# Patient Record
Sex: Male | Born: 1960 | Race: Black or African American | Hispanic: No | Marital: Single | Smoking: Never smoker
Health system: Southern US, Community
[De-identification: ages and names within clinical notes are randomized; demographics above are authoritative.]

## PROBLEM LIST (undated history)

## (undated) DIAGNOSIS — E559 Vitamin D deficiency, unspecified: Secondary | ICD-10-CM

## (undated) DIAGNOSIS — N201 Calculus of ureter: Secondary | ICD-10-CM

## (undated) DIAGNOSIS — N319 Neuromuscular dysfunction of bladder, unspecified: Secondary | ICD-10-CM

## (undated) DIAGNOSIS — M62838 Other muscle spasm: Secondary | ICD-10-CM

## (undated) DIAGNOSIS — L89309 Pressure ulcer of unspecified buttock, unspecified stage: Secondary | ICD-10-CM

## (undated) DIAGNOSIS — G709 Myoneural disorder, unspecified: Secondary | ICD-10-CM

## (undated) DIAGNOSIS — Z66 Do not resuscitate: Secondary | ICD-10-CM

## (undated) DIAGNOSIS — E871 Hypo-osmolality and hyponatremia: Secondary | ICD-10-CM

## (undated) DIAGNOSIS — R209 Unspecified disturbances of skin sensation: Secondary | ICD-10-CM

## (undated) DIAGNOSIS — N200 Calculus of kidney: Secondary | ICD-10-CM

## (undated) DIAGNOSIS — G35 Multiple sclerosis: Secondary | ICD-10-CM

## (undated) DIAGNOSIS — M25559 Pain in unspecified hip: Secondary | ICD-10-CM

## (undated) DIAGNOSIS — R339 Retention of urine, unspecified: Secondary | ICD-10-CM

## (undated) HISTORY — DX: Unspecified disturbances of skin sensation: R20.9

## (undated) HISTORY — DX: Vitamin D deficiency, unspecified: E55.9

## (undated) HISTORY — DX: Pressure ulcer of unspecified buttock, unspecified stage: L89.309

## (undated) HISTORY — DX: Other muscle spasm: M62.838

## (undated) HISTORY — DX: Pain in unspecified hip: M25.559

## (undated) HISTORY — PX: KIDNEY STONE SURGERY: SHX686

---

## 1997-08-24 ENCOUNTER — Encounter (HOSPITAL_COMMUNITY): Admission: RE | Admit: 1997-08-24 | Discharge: 1997-11-22 | Payer: Self-pay | Admitting: Neurology

## 1999-02-25 ENCOUNTER — Ambulatory Visit (HOSPITAL_COMMUNITY): Admission: RE | Admit: 1999-02-25 | Discharge: 1999-02-25 | Payer: Self-pay | Admitting: *Deleted

## 1999-02-25 ENCOUNTER — Encounter: Payer: Self-pay | Admitting: *Deleted

## 1999-04-08 ENCOUNTER — Ambulatory Visit (HOSPITAL_COMMUNITY): Admission: RE | Admit: 1999-04-08 | Discharge: 1999-04-08 | Payer: Self-pay | Admitting: *Deleted

## 1999-04-29 ENCOUNTER — Encounter: Payer: Self-pay | Admitting: *Deleted

## 1999-04-29 ENCOUNTER — Ambulatory Visit (HOSPITAL_COMMUNITY): Admission: RE | Admit: 1999-04-29 | Discharge: 1999-04-29 | Payer: Self-pay | Admitting: *Deleted

## 1999-05-15 ENCOUNTER — Ambulatory Visit (HOSPITAL_COMMUNITY): Admission: RE | Admit: 1999-05-15 | Discharge: 1999-05-15 | Payer: Self-pay | Admitting: *Deleted

## 1999-05-15 ENCOUNTER — Encounter: Payer: Self-pay | Admitting: *Deleted

## 2001-02-26 ENCOUNTER — Encounter: Admission: RE | Admit: 2001-02-26 | Discharge: 2001-04-05 | Payer: Self-pay | Admitting: Neurology

## 2002-12-07 ENCOUNTER — Ambulatory Visit: Admission: RE | Admit: 2002-12-07 | Discharge: 2002-12-07 | Payer: Self-pay | Admitting: Neurology

## 2002-12-15 ENCOUNTER — Encounter (HOSPITAL_COMMUNITY): Admission: RE | Admit: 2002-12-15 | Discharge: 2003-03-15 | Payer: Self-pay | Admitting: Neurology

## 2003-02-10 ENCOUNTER — Ambulatory Visit (HOSPITAL_COMMUNITY): Admission: RE | Admit: 2003-02-10 | Discharge: 2003-02-10 | Payer: Self-pay | Admitting: Neurology

## 2003-02-10 ENCOUNTER — Encounter: Payer: Self-pay | Admitting: Neurology

## 2003-03-16 ENCOUNTER — Encounter (HOSPITAL_COMMUNITY): Admission: RE | Admit: 2003-03-16 | Discharge: 2003-06-14 | Payer: Self-pay | Admitting: Neurology

## 2003-06-22 ENCOUNTER — Encounter (HOSPITAL_COMMUNITY): Admission: RE | Admit: 2003-06-22 | Discharge: 2003-09-20 | Payer: Self-pay | Admitting: Neurology

## 2003-12-14 ENCOUNTER — Ambulatory Visit (HOSPITAL_COMMUNITY): Admission: RE | Admit: 2003-12-14 | Discharge: 2003-12-14 | Payer: Self-pay | Admitting: Neurology

## 2004-02-15 ENCOUNTER — Encounter (HOSPITAL_COMMUNITY): Admission: RE | Admit: 2004-02-15 | Discharge: 2004-05-15 | Payer: Self-pay | Admitting: Neurology

## 2004-06-19 ENCOUNTER — Ambulatory Visit: Admission: RE | Admit: 2004-06-19 | Discharge: 2004-06-19 | Payer: Self-pay | Admitting: Neurology

## 2004-06-19 ENCOUNTER — Ambulatory Visit: Payer: Self-pay | Admitting: Cardiology

## 2004-11-15 ENCOUNTER — Encounter: Admission: RE | Admit: 2004-11-15 | Discharge: 2004-11-15 | Payer: Self-pay | Admitting: Neurology

## 2010-10-07 ENCOUNTER — Other Ambulatory Visit: Payer: Self-pay | Admitting: Neurology

## 2010-10-07 DIAGNOSIS — R202 Paresthesia of skin: Secondary | ICD-10-CM

## 2010-10-07 DIAGNOSIS — M8580 Other specified disorders of bone density and structure, unspecified site: Secondary | ICD-10-CM

## 2010-10-07 DIAGNOSIS — G35 Multiple sclerosis: Secondary | ICD-10-CM

## 2010-10-21 ENCOUNTER — Ambulatory Visit
Admission: RE | Admit: 2010-10-21 | Discharge: 2010-10-21 | Disposition: A | Discharge status: Home / Self Care | Payer: BC Managed Care – PPO | Source: Ambulatory Visit | Attending: Neurology | Admitting: Neurology

## 2010-10-21 DIAGNOSIS — M8580 Other specified disorders of bone density and structure, unspecified site: Secondary | ICD-10-CM

## 2010-10-21 DIAGNOSIS — G35 Multiple sclerosis: Secondary | ICD-10-CM

## 2010-10-21 DIAGNOSIS — R202 Paresthesia of skin: Secondary | ICD-10-CM

## 2011-07-10 ENCOUNTER — Emergency Department (HOSPITAL_COMMUNITY): Payer: BC Managed Care – PPO

## 2011-07-10 ENCOUNTER — Encounter (HOSPITAL_COMMUNITY): Payer: Self-pay | Admitting: Emergency Medicine

## 2011-07-10 ENCOUNTER — Other Ambulatory Visit (HOSPITAL_COMMUNITY): Payer: Self-pay | Admitting: Pharmacy Technician

## 2011-07-10 ENCOUNTER — Emergency Department (HOSPITAL_COMMUNITY)
Admission: EM | Admit: 2011-07-10 | Discharge: 2011-07-11 | Disposition: A | Discharge status: Home Health | Payer: BC Managed Care – PPO | Attending: Emergency Medicine | Admitting: Emergency Medicine

## 2011-07-10 DIAGNOSIS — G35 Multiple sclerosis: Secondary | ICD-10-CM | POA: Insufficient documentation

## 2011-07-10 DIAGNOSIS — R5381 Other malaise: Secondary | ICD-10-CM | POA: Insufficient documentation

## 2011-07-10 DIAGNOSIS — R000 Tachycardia, unspecified: Secondary | ICD-10-CM | POA: Insufficient documentation

## 2011-07-10 DIAGNOSIS — M6282 Rhabdomyolysis: Secondary | ICD-10-CM | POA: Insufficient documentation

## 2011-07-10 DIAGNOSIS — M25559 Pain in unspecified hip: Secondary | ICD-10-CM | POA: Insufficient documentation

## 2011-07-10 DIAGNOSIS — W19XXXA Unspecified fall, initial encounter: Secondary | ICD-10-CM

## 2011-07-10 DIAGNOSIS — R509 Fever, unspecified: Secondary | ICD-10-CM | POA: Insufficient documentation

## 2011-07-10 HISTORY — DX: Multiple sclerosis: G35

## 2011-07-10 LAB — DIFFERENTIAL
Basophils Absolute: 0 10*3/uL (ref 0.0–0.1)
Basophils Relative: 0 % (ref 0–1)
Eosinophils Absolute: 0.2 10*3/uL (ref 0.0–0.7)
Monocytes Absolute: 1 10*3/uL (ref 0.1–1.0)
Monocytes Relative: 9 % (ref 3–12)
Neutro Abs: 8.7 10*3/uL — ABNORMAL HIGH (ref 1.7–7.7)
Neutrophils Relative %: 78 % — ABNORMAL HIGH (ref 43–77)

## 2011-07-10 LAB — CBC
HCT: 41.6 % (ref 39.0–52.0)
Hemoglobin: 14.1 g/dL (ref 13.0–17.0)
MCH: 27.5 pg (ref 26.0–34.0)
MCHC: 33.9 g/dL (ref 30.0–36.0)
RDW: 14.2 % (ref 11.5–15.5)

## 2011-07-10 LAB — POCT I-STAT, CHEM 8
BUN: 12 mg/dL (ref 6–23)
Calcium, Ion: 1.05 mmol/L — ABNORMAL LOW (ref 1.12–1.32)
Chloride: 100 mEq/L (ref 96–112)
Glucose, Bld: 101 mg/dL — ABNORMAL HIGH (ref 70–99)
TCO2: 27 mmol/L (ref 0–100)

## 2011-07-10 MED ORDER — OXYCODONE HCL 5 MG PO TABS
5.0000 mg | ORAL_TABLET | Freq: Once | ORAL | Status: AC
  Administered 2011-07-10: 5 mg via ORAL
  Filled 2011-07-10: qty 1

## 2011-07-10 NOTE — ED Notes (Signed)
MD at bedside. Almond Lint, PA at bedside.

## 2011-07-10 NOTE — ED Provider Notes (Signed)
History     CSN: [id number]  Arrival date & time 07/10/11  2033   First MD Initiated Contact with Patient 07/10/11 2039      Chief Complaint  Patient presents with  . Hip Pain  . Fall    (Consider location/radiation/quality/duration/timing/severity/associated sxs/prior treatment) HPI Comments: With multiple sclerosis, that he slid out of bed yesterday.  He sat on the floor and landed on his buttock.  He proceeded to bring his legs behind him sitting froglike for several hours as he was unable to get up by himself and he was allowed in the home.  He stayed in that position for approximately 10 hours when someone came and assisted him back up into the, bed.  He's been trying to stretch and move his legs, but he is having pain in the bilateral inner groin area and is now unable to bear weight on his legs.  Today, was the Betaseron injection day, and he, normally runs low grade fevers with this.  His fever on presentation to the emergency room is 100 by mouth  Patient is a 51 y.o. male presenting with hip pain and fall.  Hip Pain This is a new problem. The current episode started yesterday. The problem occurs constantly. The problem has been unchanged. Associated symptoms include a fever and weakness. Pertinent negatives include no joint swelling or nausea. The symptoms are aggravated by nothing. The treatment provided no relief.  Fall Associated symptoms include a fever. Pertinent negatives include no nausea.    Past Medical History  Diagnosis Date  . Multiple sclerosis     No past surgical history on file.  No family history on file.  History  Substance Use Topics  . Smoking status: Not on file  . Smokeless tobacco: Not on file  . Alcohol Use:       Review of Systems  Constitutional: Positive for fever.  Gastrointestinal: Negative for nausea, diarrhea and constipation.  Musculoskeletal: Negative for joint swelling.  Neurological: Positive for weakness.    Allergies    Review of patient's allergies indicates no known allergies.  Home Medications  No current outpatient prescriptions on file.  BP 145/82  Pulse 129  Temp(Src) 100.1 F (37.8 C) (Oral)  Resp 22  SpO2 94%  Physical Exam  Constitutional: He is oriented to person, place, and time. He appears well-developed and well-nourished.  HENT:  Head: Normocephalic.  Eyes: Pupils are equal, round, and reactive to light.  Neck: Normal range of motion.  Cardiovascular: Tachycardia present.   Pulmonary/Chest: Effort normal.  Abdominal: Soft.  Musculoskeletal: He exhibits no edema and no tenderness.  Neurological: He is alert and oriented to person, place, and time.  Skin: Skin is warm and dry.    ED Course  Procedures (including critical care time)  Labs Reviewed - No data to display No results found.   No diagnosis found.    MDM  Fall, mild rhabdo patient was hydrated with IV fluids is feeling better.  His pelvis was x-rayed for potential fracture, which was negative        Arman Filter, NP 07/11/11 0559

## 2011-07-10 NOTE — ED Notes (Signed)
WGN:[id number]<HY> Expected date:07/10/11<BR> Expected time: 8:35 PM<BR> Means of arrival:Ambulance<BR> Comments:<BR> EMS 231 GC - fall/hip pain

## 2011-07-10 NOTE — ED Notes (Signed)
Pt fell 2 days ago and then fell again tonight. Pt has hx of MS. Pt states his equilibrium is off. Pt c/o bilateral hip pain. Pt states his hips have been stiff and having difficulty bearing weight. EMS reports no deformities or bruising to hips. Pt normally ambulates with cane.

## 2011-07-11 LAB — CK TOTAL AND CKMB (NOT AT ARMC): Relative Index: 0.7 (ref 0.0–2.5)

## 2011-07-11 MED ORDER — SODIUM CHLORIDE 0.9 % IV SOLN
Freq: Once | INTRAVENOUS | Status: AC
  Administered 2011-07-11: 06:00:00 via INTRAVENOUS

## 2011-07-11 MED ORDER — SODIUM CHLORIDE 0.9 % IV BOLUS (SEPSIS)
1000.0000 mL | Freq: Once | INTRAVENOUS | Status: AC
  Administered 2011-07-11: 1000 mL via INTRAVENOUS

## 2011-07-11 NOTE — ED Notes (Signed)
[facility] here to get pt  

## 2011-07-11 NOTE — ED Notes (Addendum)
[facility] called to come pick up pt.  

## 2011-07-11 NOTE — Discharge Instructions (Signed)
Rhabdomyolysis Rhabdomyolysis is the breakdown of muscle fibers due to injury. The injury may come from physical damage to the muscle like an injury but other causes are:  High fever (hyperthermia).   Seizures (convulsions).   Low phosphate levels.   Diseases of metabolism.   Heatstroke.   Drug toxicity.   Over exertion.   Alcoholism.   Muscle is cut off from oxygen (anoxia).   The squeezing of nerves and blood vessels (compartment syndrome).  Some drugs which may cause the breakdown of muscle are:  Antibiotics.   Statins.   Alcohol.   Animal toxins.  Myoglobin is a substance which helps muscle use oxygen. When the muscle is damaged, the myoglobin is released into the bloodstream. It is filtered out of the bloodstream by the kidneys. Myoglobin may block up the kidneys. This may cause damage, such as kidney failure. It also breaks down into other damaging toxic parts, which also cause kidney failure.  SYMPTOMS   Dark, red, or tea colored urine.   Weakness of affected muscles.   Weight gain from water retention.   Joint aches and pains.   Irregular heart from high potassium in the blood.   Muscle tenderness or aching.   Generalized weakness.   Seizures.   Feeling tired (fatigue).  DIAGNOSIS  Your caregiver may find muscle tenderness on exam and suspect the problem. Urine tests and blood work can confirm the problem. TREATMENT   Early and aggressive treatment with large amounts of fluids may help prevent kidney failure.   Water producing medicine (diuretic) may be used to help flush the kidneys.   High potassium and calcium problems (electrolyte) in your blood may need treatment.  HOME CARE INSTRUCTIONS  This problem is usually cared for in a hospital. If you are allowed to go home and require dialysis, make sure you keep all appointments for lab work and dialysis. Not doing so could result in death. Document Released: 04-21-04 Document Revised:  01/15/2011 Document Reviewed: 10/30/2008 [facility] Patient Information 2012 [place], [place]. I have made a referral to home health for evaluation to help with your social situation and help you with ambulation.  They should be contacting you within the next one to 2 days

## 2011-07-12 NOTE — ED Provider Notes (Signed)
Medical screening examination/treatment/procedure(s) were performed by non-physician practitioner and as supervising physician I was immediately available for consultation/collaboration. Sabre Leonetti, MD, FACEP   Ariele Vidrio L Yeila Morro, MD 07/12/11 0053 

## 2011-07-25 ENCOUNTER — Other Ambulatory Visit: Payer: Self-pay

## 2011-07-25 ENCOUNTER — Inpatient Hospital Stay (HOSPITAL_COMMUNITY)
Admission: EM | Admit: 2011-07-25 | Discharge: 2011-08-04 | DRG: 560 | Disposition: A | Discharge status: Skilled Nursing Facility | Payer: BC Managed Care – PPO | Attending: Internal Medicine | Admitting: Internal Medicine

## 2011-07-25 DIAGNOSIS — K5641 Fecal impaction: Secondary | ICD-10-CM | POA: Diagnosis present

## 2011-07-25 DIAGNOSIS — L98499 Non-pressure chronic ulcer of skin of other sites with unspecified severity: Secondary | ICD-10-CM

## 2011-07-25 DIAGNOSIS — R000 Tachycardia, unspecified: Secondary | ICD-10-CM

## 2011-07-25 DIAGNOSIS — G35 Multiple sclerosis: Secondary | ICD-10-CM | POA: Diagnosis present

## 2011-07-25 DIAGNOSIS — G35D Multiple sclerosis, unspecified: Secondary | ICD-10-CM | POA: Diagnosis present

## 2011-07-25 DIAGNOSIS — R7309 Other abnormal glucose: Secondary | ICD-10-CM | POA: Diagnosis present

## 2011-07-25 DIAGNOSIS — D473 Essential (hemorrhagic) thrombocythemia: Secondary | ICD-10-CM | POA: Diagnosis present

## 2011-07-25 DIAGNOSIS — L89209 Pressure ulcer of unspecified hip, unspecified stage: Secondary | ICD-10-CM | POA: Diagnosis present

## 2011-07-25 DIAGNOSIS — M6282 Rhabdomyolysis: Principal | ICD-10-CM | POA: Diagnosis present

## 2011-07-25 DIAGNOSIS — M25559 Pain in unspecified hip: Secondary | ICD-10-CM | POA: Diagnosis present

## 2011-07-25 DIAGNOSIS — L8995 Pressure ulcer of unspecified site, unstageable: Secondary | ICD-10-CM | POA: Diagnosis present

## 2011-07-25 DIAGNOSIS — W19XXXA Unspecified fall, initial encounter: Secondary | ICD-10-CM | POA: Diagnosis present

## 2011-07-25 DIAGNOSIS — D75839 Thrombocytosis, unspecified: Secondary | ICD-10-CM | POA: Diagnosis present

## 2011-07-25 DIAGNOSIS — T07XXXA Unspecified multiple injuries, initial encounter: Secondary | ICD-10-CM | POA: Diagnosis present

## 2011-07-25 DIAGNOSIS — L0390 Cellulitis, unspecified: Secondary | ICD-10-CM

## 2011-07-25 DIAGNOSIS — D649 Anemia, unspecified: Secondary | ICD-10-CM | POA: Diagnosis present

## 2011-07-25 DIAGNOSIS — L89899 Pressure ulcer of other site, unspecified stage: Secondary | ICD-10-CM | POA: Diagnosis present

## 2011-07-25 DIAGNOSIS — R509 Fever, unspecified: Secondary | ICD-10-CM | POA: Diagnosis present

## 2011-07-25 DIAGNOSIS — E860 Dehydration: Secondary | ICD-10-CM | POA: Diagnosis present

## 2011-07-25 DIAGNOSIS — D72829 Elevated white blood cell count, unspecified: Secondary | ICD-10-CM | POA: Diagnosis present

## 2011-07-25 DIAGNOSIS — D6489 Other specified anemias: Secondary | ICD-10-CM | POA: Diagnosis present

## 2011-07-25 LAB — DIFFERENTIAL
Basophils Absolute: 0.1 10*3/uL (ref 0.0–0.1)
Basophils Relative: 0 % (ref 0–1)
Eosinophils Absolute: 0.1 10*3/uL (ref 0.0–0.7)
Eosinophils Relative: 1 % (ref 0–5)
Monocytes Absolute: 1.1 10*3/uL — ABNORMAL HIGH (ref 0.1–1.0)

## 2011-07-25 LAB — CBC
HCT: 42.5 % (ref 39.0–52.0)
MCH: 26.2 pg (ref 26.0–34.0)
MCHC: 31.8 g/dL (ref 30.0–36.0)
MCV: 82.5 fL (ref 78.0–100.0)
Platelets: 474 10*3/uL — ABNORMAL HIGH (ref 150–400)
RDW: 14.1 % (ref 11.5–15.5)

## 2011-07-25 LAB — COMPREHENSIVE METABOLIC PANEL
ALT: 28 U/L (ref 0–53)
AST: 40 U/L — ABNORMAL HIGH (ref 0–37)
CO2: 30 mEq/L (ref 19–32)
Calcium: 9.6 mg/dL (ref 8.4–10.5)
Creatinine, Ser: 0.63 mg/dL (ref 0.50–1.35)
GFR calc non Af Amer: >90 mL/min (ref >90)
Sodium: 134 mEq/L — ABNORMAL LOW (ref 135–145)
Total Protein: 7.7 g/dL (ref 6.0–8.3)

## 2011-07-25 MED ORDER — SODIUM BICARBONATE 8.4 % IV SOLN
INTRAVENOUS | Status: DC
  Filled 2011-07-25 (×3): qty 1000

## 2011-07-25 MED ORDER — SODIUM CHLORIDE 0.9 % IV BOLUS (SEPSIS)
1000.0000 mL | Freq: Once | INTRAVENOUS | Status: AC
  Administered 2011-07-25: 1000 mL via INTRAVENOUS

## 2011-07-25 MED ORDER — METHYLPREDNISOLONE SODIUM SUCC 125 MG IJ SOLR
125.0000 mg | Freq: Once | INTRAMUSCULAR | Status: AC
  Administered 2011-07-26: 125 mg via INTRAVENOUS
  Filled 2011-07-25: qty 2

## 2011-07-25 NOTE — ED Provider Notes (Addendum)
History     CSN: [id number]  Arrival date & time 07/25/11  1924   First MD Initiated Contact with Patient 07/25/11 2034      Chief Complaint  Patient presents with  . Weakness  . Multiple Sclerosis    (Consider location/radiation/quality/duration/timing/severity/associated sxs/prior treatment) HPI  51yoM h/o MS pw weakness. Ordering to the patient he has been sitting on the floor at home since last Sunday approximately 5 days. The patient states that he was unable to transfer him to his bed and slid onto the floor. He states that he's been lying on his right side since that time. The patient states that he did have access to water and a urinal. He states that he also had access to a phone but could not find his situation to be unusual and did not call anyone for help. He lives with his mother who has dementia. The patient complains of his baseline weakness. He states that he has baseline pain in both hips as well. It is not worse the usual. He denies trauma, fall. He denies fevers, chills, chest pain, shortness of breath. He states that he was referred here for evaluation and possible placement into a skilled nursing facility. She denies headache, dizziness, chest pain, shortness of breath. He denies abdominal pain, nausea, vomiting. There is no back pain. As noted above baseline pain in his hips. There is no new numbness, tingling or weakness of his lower extremity. He is contracted. He is due for his steroids today but states that he was referred here instead.  ED Notes, ED Provider Notes from 07/25/11 0000 to 07/25/11 20:18:08       Thomasenia Bottoms, RN 07/25/2011 20:15      EMS sts pt in floor since Sunday. Pt sts has MS. Fell on floor Sunday. Could not get up. Fell asleep on floor. Mother had been bring him food/fluids, but was not able get him up. She did not call for help.         Thomasenia Bottoms, RN 07/25/2011 19:41      WUJ:[id number]  Expected date:07/25/11  Expected time: 7:03 PM  Means of  arrival:Ambulance  Comments:  M80. Male, fall found in floor, possible been there for days, hx of MS, needs eval and placement into facility. 15 mins    Past Medical History  Diagnosis Date  . Multiple sclerosis    No past surgical history on file.  No family history on file.  History  Substance Use Topics  . Smoking status: Not on file  . Smokeless tobacco: Not on file  . Alcohol Use:     Review of Systems  All other systems reviewed and are negative.  except as noted HPI   Allergies  Review of patient's allergies indicates no known allergies.  Home Medications   Current Outpatient Rx  Name Route Sig Dispense Refill  . AMANTADINE HCL 100 MG PO CAPS Oral Take 200 mg by mouth daily.    Marland Kitchen CALCIUM CARBONATE 1250 MG PO TABS Oral Take 1 tablet by mouth daily.    Marland Kitchen DANTROLENE SODIUM 25 MG PO CAPS Oral Take 25 mg by mouth 3 (three) times daily.    Marland Kitchen GABAPENTIN 600 MG PO TABS Oral Take 600 mg by mouth 4 (four) times daily.    . INTERFERON BETA-1B 0.3 MG Pepin SOLR Subcutaneous Inject 0.25 mg into the skin every other day. Next dose 07-11-2011    . MODAFINIL 200 MG PO TABS Oral Take 200 mg by  mouth daily.      BP 117/77  Pulse 128  Temp(Src) 102.2 F (39 C) (Rectal)  Resp 12  SpO2 96%  Physical Exam  Nursing note and vitals reviewed. Constitutional: He is oriented to person, place, and time. He appears well-developed and well-nourished. No distress.  HENT:  Head: Atraumatic.  Mouth/Throat: Oropharynx is clear and moist.  Eyes: Conjunctivae are normal. Pupils are equal, round, and reactive to light.  Neck: Neck supple.  Cardiovascular: Regular rhythm, normal heart sounds and intact distal pulses.  Exam reveals no gallop and no friction rub.   No murmur heard.      tachycardic  Pulmonary/Chest: Effort normal. No respiratory distress. He has no wheezes. He has no rales.  Abdominal: Soft. Bowel sounds are normal. There is no tenderness. There is no rebound and no guarding.    Musculoskeletal: Normal range of motion. He exhibits no edema and no tenderness.  Neurological: He is alert and oriented to person, place, and time.       Lower extremities contracted  Skin: Skin is warm and dry.          R lateral mid/upper/lower back with minimally bleeding ulcer with surrounding erythema, and necrotic tissue no ttp  Per nursing staff, Grade II ulcer R buttocks  Lt hip with healing ulcer/scab  Psychiatric: He has a normal mood and affect.    Date: 07/25/2011  Rate: 129  Rhythm: sinus tachycardia  QRS Axis: normal  Intervals: normal  ST/T Wave abnormalities: normal  Conduction Disutrbances:none  Narrative Interpretation:   Old EKG Reviewed: none available   ED Course  Procedures (including critical care time)  Labs Reviewed  CBC - Abnormal; Notable for the following:    WBC 12.5 (*)    Platelets 474 (*)    All other components within normal limits  DIFFERENTIAL - Abnormal; Notable for the following:    Neutrophils Relative 79 (*)    Neutro Abs 9.9 (*)    Lymphocytes Relative 11 (*)    Monocytes Absolute 1.1 (*)    All other components within normal limits  COMPREHENSIVE METABOLIC PANEL - Abnormal; Notable for the following:    Sodium 134 (*)    Chloride 93 (*)    Glucose, Bld 101 (*)    Albumin 2.8 (*)    AST 40 (*)    All other components within normal limits  CK - Abnormal; Notable for the following:    Total CK 1526 (*)    All other components within normal limits   No results found.   1. Dehydration   2. Skin ulcer   3. Multiple sclerosis   4. Tachycardia   5. Cellulitis     MDM  Patient presents after being on the floor for several days. He states that his MS symptoms are at baseline. He has multiple ulcers on his body with surrounding cellulitis.  He is not having pain at the site of the ulcers. He denies fevers, chills. IVF/hydration. Continues to deny pain. Admitted to [facility] for further w/u and evaluation as well as  possible SNF placement- requesting D5 1/2NS with sodium bicarbonate. Ordered. Will order patient's steroid as he missed dose today. Will cover for cellulitis with clindamycin.  Of note, last visit patient tachycardic to 129 although states that his HR is typically < 100. Discussed admission with [facility] hospitalist Dr. Conley Rolls.   Per nursing staff rectal temp 101. Tylenol ordered.        Forbes Cellar, MD 07/25/11  1610  Forbes Cellar, MD 07/26/11 9604  Forbes Cellar, MD 07/26/11 5409

## 2011-07-25 NOTE — ED Notes (Signed)
ZOX:[id number]<AV> Expected date:07/25/11<BR> Expected time: 7:03 PM<BR> Means of arrival:Ambulance<BR> Comments:<BR> M80. Male, fall found in floor, possible been there for days, hx of MS, needs eval and placement into facility. 15 mins

## 2011-07-25 NOTE — ED Notes (Signed)
EMS sts pt in floor since Sunday. Pt sts has MS. Fell on floor Sunday. Could not get up. Fell asleep on floor. Mother had been bring him food/fluids, but was not able get him up. She did not call for help.

## 2011-07-26 ENCOUNTER — Encounter (HOSPITAL_COMMUNITY): Payer: Self-pay | Admitting: Internal Medicine

## 2011-07-26 DIAGNOSIS — M6282 Rhabdomyolysis: Principal | ICD-10-CM | POA: Diagnosis present

## 2011-07-26 DIAGNOSIS — E860 Dehydration: Secondary | ICD-10-CM | POA: Diagnosis present

## 2011-07-26 DIAGNOSIS — G35 Multiple sclerosis: Secondary | ICD-10-CM | POA: Diagnosis present

## 2011-07-26 LAB — BASIC METABOLIC PANEL
CO2: 25 mEq/L (ref 19–32)
Glucose, Bld: 188 mg/dL — ABNORMAL HIGH (ref 70–99)
Potassium: 4 mEq/L (ref 3.5–5.1)
Sodium: 135 mEq/L (ref 135–145)

## 2011-07-26 LAB — CBC
Hemoglobin: 11.8 g/dL — ABNORMAL LOW (ref 13.0–17.0)
MCH: 26.8 pg (ref 26.0–34.0)
MCV: 81.4 fL (ref 78.0–100.0)
RBC: 4.41 MIL/uL (ref 4.22–5.81)

## 2011-07-26 MED ORDER — SODIUM CHLORIDE 0.9 % IV SOLN
INTRAVENOUS | Status: DC
  Administered 2011-07-26: 150 mL/h via INTRAVENOUS
  Administered 2011-07-27 (×3): via INTRAVENOUS

## 2011-07-26 MED ORDER — INTERFERON BETA-1B 0.3 MG ~~LOC~~ SOLR
0.2500 mg | SUBCUTANEOUS | Status: DC
  Administered 2011-07-26 – 2011-08-03 (×5): 0.25 mg via SUBCUTANEOUS

## 2011-07-26 MED ORDER — ENOXAPARIN SODIUM 40 MG/0.4ML ~~LOC~~ SOLN
40.0000 mg | SUBCUTANEOUS | Status: DC
  Administered 2011-07-26 – 2011-08-04 (×10): 40 mg via SUBCUTANEOUS
  Filled 2011-07-26 (×11): qty 0.4

## 2011-07-26 MED ORDER — CALCIUM CARBONATE 1250 (500 CA) MG PO TABS
1.0000 | ORAL_TABLET | Freq: Every day | ORAL | Status: DC
  Administered 2011-07-26 – 2011-08-04 (×10): 500 mg via ORAL
  Filled 2011-07-26 (×11): qty 1

## 2011-07-26 MED ORDER — GABAPENTIN 300 MG PO CAPS
600.0000 mg | ORAL_CAPSULE | Freq: Four times a day (QID) | ORAL | Status: DC
  Administered 2011-07-26 – 2011-08-04 (×39): 600 mg via ORAL
  Filled 2011-07-26 (×44): qty 2

## 2011-07-26 MED ORDER — DEXTROSE-NACL 5-0.9 % IV SOLN
INTRAVENOUS | Status: DC
  Administered 2011-07-26: 1000 mL via INTRAVENOUS
  Administered 2011-07-26: 14:00:00 via INTRAVENOUS

## 2011-07-26 MED ORDER — DANTROLENE SODIUM 25 MG PO CAPS
25.0000 mg | ORAL_CAPSULE | Freq: Three times a day (TID) | ORAL | Status: DC
  Administered 2011-07-26 – 2011-08-04 (×29): 25 mg via ORAL
  Filled 2011-07-26 (×34): qty 1

## 2011-07-26 MED ORDER — LEVOFLOXACIN IN D5W 750 MG/150ML IV SOLN
750.0000 mg | INTRAVENOUS | Status: DC
  Administered 2011-07-26 – 2011-07-27 (×2): 750 mg via INTRAVENOUS
  Filled 2011-07-26 (×3): qty 150

## 2011-07-26 MED ORDER — INFLUENZA VIRUS VACC SPLIT PF IM SUSP
0.5000 mL | Freq: Once | INTRAMUSCULAR | Status: AC
  Administered 2011-07-26: 0.5 mL via INTRAMUSCULAR
  Filled 2011-07-26: qty 0.5

## 2011-07-26 MED ORDER — AMANTADINE HCL 100 MG PO CAPS
200.0000 mg | ORAL_CAPSULE | Freq: Every day | ORAL | Status: DC
  Administered 2011-07-26: 200 mg via ORAL
  Administered 2011-07-27: 100 mg via ORAL
  Filled 2011-07-26 (×3): qty 2

## 2011-07-26 MED ORDER — ACETAMINOPHEN 325 MG PO TABS
650.0000 mg | ORAL_TABLET | Freq: Once | ORAL | Status: AC
  Administered 2011-07-26: 650 mg via ORAL
  Filled 2011-07-26: qty 2

## 2011-07-26 MED ORDER — SODIUM BICARBONATE 8.4 % IV SOLN
INTRAVENOUS | Status: DC
  Filled 2011-07-26 (×2): qty 1000

## 2011-07-26 MED ORDER — CLINDAMYCIN PHOSPHATE 900 MG/50ML IV SOLN
900.0000 mg | Freq: Once | INTRAVENOUS | Status: AC
  Administered 2011-07-26: 900 mg via INTRAVENOUS
  Filled 2011-07-26: qty 50

## 2011-07-26 MED ORDER — MODAFINIL 200 MG PO TABS
200.0000 mg | ORAL_TABLET | Freq: Every day | ORAL | Status: DC
  Administered 2011-07-26 – 2011-08-04 (×10): 200 mg via ORAL
  Filled 2011-07-26 (×11): qty 1

## 2011-07-26 NOTE — ED Notes (Signed)
Has spent 30 minutes with patient.  Performed peri care and also full linen change.

## 2011-07-26 NOTE — Progress Notes (Signed)
PROGRESS NOTE  [name] Mccoy:[id number] DOB: 14-Oct-1960 DOA: 07/25/2011 PCP: No primary provider on file. Neurologist: Avie Echevaria, M.D.  Brief narrative: 51 year old man with history of multiple sclerosis. He presented to the emergency department with a history of a fall approximately 5 days prior to admission. He was unable to get up and therefore had been laying on his right side since that time. In the emergency department he was noted to be febrile and have cellulitis. Other conditions included dehydration and rhabdomyolysis.  Summary of records:  07/10/2011 emergency department visit: Fall. Mild rhabdomyolysis. Patient was given IV fluids and discharged.  Past medical history: Multiple sclerosis, chronic bilateral hip pain  Consultants:  None  Procedures:  None  Antibiotics:  March 9-9: Clindamycin  March 9: Levaquin  Interim History: Chart reviewed in detail. Fever persists.  Subjective: Patient feels okay.  Objective: Filed Vitals:   07/26/11 0100 07/26/11 0200 07/26/11 0243 07/26/11 0342  BP: 115/89 120/77  103/67  Pulse: 124 130  120  Temp:   101.5 F (38.6 C) 99.7 F (37.6 C)  TempSrc:   Oral Oral  Resp: 19   20  Height:    6\' 3"  (1.905 m)  Weight:    64.3 kg (141 lb 12.1 oz)  SpO2: 97% 96%  94%   No intake or output data in the 24 hours ending 07/26/11 0950  Exam:   General:  Appears calm and comfortable.  Cardiovascular: Regular rate and rhythm. No murmur, rub, gallop. No lower extremity edema.  Respiratory: Clear to auscultation bilaterally. No wheezes, rales, rhonchi. Normal respiratory effort.  Skin: Ulcers bandaged.  Psychiatric: Grossly normal mood and affect. Speech fluent and clear. Good historian.  Data Reviewed: Basic Metabolic Panel:  Lab 07/26/11 8119 07/25/11 2040  NA 135 134*  K 4.0 3.8  CL 103 93*  CO2 25 30  GLUCOSE 188* 101*  BUN 17 19  CREATININE 0.57 0.63  CALCIUM 8.2* 9.6  MG -- --  PHOS -- --   Liver  Function Tests:  Lab 07/25/11 2040  AST 40*  ALT 28  ALKPHOS 78  BILITOT 0.7  PROT 7.7  ALBUMIN 2.8*   CBC:  Lab 07/26/11 0538 07/25/11 2040  WBC 12.1* 12.5*  NEUTROABS -- 9.9*  HGB 11.8* 13.5  HCT 35.9* 42.5  MCV 81.4 82.5  PLT 406* 474*   Cardiac Enzymes:  Lab 07/25/11 2040  CKTOTAL 1526*  CKMB --  CKMBINDEX --  TROPONINI --    Recent Results (from the past 240 hour(s))  MRSA PCR SCREENING     Status: Normal   Collection Time   07/26/11  5:21 AM      Component Value Range Status Comment   MRSA by PCR NEGATIVE  NEGATIVE  Final      Studies:  Scheduled Meds:   . acetaminophen  650 mg Oral Once  . amantadine  200 mg Oral Daily  . calcium carbonate  1 tablet Oral Daily  . clindamycin (CLEOCIN) IV  900 mg Intravenous Once  . dantrolene  25 mg Oral TID  . enoxaparin  40 mg Subcutaneous Q24H  . gabapentin  600 mg Oral QID  . influenza  inactive virus vaccine  0.5 mL Intramuscular Once  . interferon beta-1b  0.25 mg Subcutaneous QODAY  . methylPREDNISolone (SOLU-MEDROL) injection  125 mg Intravenous Once  . modafinil  200 mg Oral Daily  . sodium chloride  1,000 mL Intravenous Once  . sodium chloride  1,000 mL Intravenous Once  .  sodium chloride  1,000 mL Intravenous Once   Continuous Infusions:   . dextrose 5 % and 0.9% NaCl 1,000 mL (07/26/11 0242)  . DISCONTD: dextrose 5 % and 0.45% NaCl 1,000 mL with sodium bicarbonate 50 mEq infusion    . DISCONTD: dextrose 5 % and 0.9% NaCl 1,000 mL with sodium bicarbonate 50 mEq infusion      EKG March 8: Independently reviewed. Sinus tachycardia. No acute changes.   Assessment/Plan: 1. Fall: secondary to weakness from multiple sclerosis. Physical therapy consultation. 2. Dehydration: Resolving with IV fluids. 3. Rhabdomyolysis: Repeat total CK in the morning. Continue IV fluids. 4. Cellulitis: Continue IV antibiotics.  5. Multiple ulcers on back, present on admission 6. Multiple sclerosis   Code Status: Full  code Family Communication: None present. Disposition Plan: Pending further evaluation and treatment. Skilled nursing facility anticipated.   Brendia Sacks, MD  [facility] Pager [phone] 07/26/2011, 9:50 AM    LOS: 1 day

## 2011-07-26 NOTE — H&P (Signed)
PCP: Melanie Crazier physician.  Chief Complaint: Unable to get up.   HPI: [name] Mccoy is an 51 y.o. male with history of multiple sclerosis, and incredibly, despite his lower leg weakness, has been able to ambulate with a cane until recently when he felt weak, slipped down on the ground and unable to get up for 5 days.  He denied any pain, headache, nausea, vomiting, diarrhea, or any other symptomology. Unfortunately, he lives with his mother who has dementia, and he no longer feel able to stay with her any longer. He has accepted the fact that he will need a SNIF placement. He has no cough, fever, chills, shortness of breath or chest pain. He has been compliant with his medications including the Betaseron along with prednisone every other day. Evaluation in emergency room included a normal white count, renal function, liver function tests, and electrolytes. His CPK is elevated at 1500. No chest x-ray was done as he had no pulmonary symptoms. Hospital was was asked to admit him for treatment of superficial decubitus with cellulitis, mild rhabdomyolysis, dehydration, and SNIF placement  Rewiew of Systems:  The patient denies anorexia, fever, weight loss,, vision loss, decreased hearing, hoarseness, chest pain, syncope, dyspnea on exertion, peripheral edema, balance deficits, hemoptysis, abdominal pain, melena, hematochezia, severe indigestion/heartburn, hematuria, incontinence, genital sores, suspicious skin lesions, transient blindness, difficulty walking, depression, unusual weight change, abnormal bleeding, enlarged lymph nodes, angioedema, and breast masses..   Past Medical History  Diagnosis Date  . Multiple sclerosis     No past surgical history on file.  Medications:  HOME MEDS: Prior to Admission medications   Medication Sig Start Date End Date Taking? Authorizing Provider  amantadine (SYMMETREL) 100 MG capsule Take 200 mg by mouth daily.   Yes Historical Provider, MD  calcium carbonate  (OS-CAL - DOSED IN MG OF ELEMENTAL CALCIUM) 1250 MG tablet Take 1 tablet by mouth daily.   Yes Historical Provider, MD  dantrolene (DANTRIUM) 25 MG capsule Take 25 mg by mouth 3 (three) times daily.   Yes Historical Provider, MD  gabapentin (NEURONTIN) 600 MG tablet Take 600 mg by mouth 4 (four) times daily.   Yes Historical Provider, MD  interferon beta-1b (BETASERON) 0.3 MG injection Inject 0.25 mg into the skin every other day. Next dose 07-11-2011   Yes Historical Provider, MD  modafinil (PROVIGIL) 200 MG tablet Take 200 mg by mouth daily.   Yes Historical Provider, MD     Allergies:  No Known Allergies  Social History:   does not have a smoking history on file. He does not have any smokeless tobacco history on file. His alcohol and drug histories not on file.  Family History: No family history on file.   Physical Exam: Filed Vitals:   07/25/11 2045 07/25/11 2100 07/25/11 2338 07/26/11 0007  BP:  114/77 102/64 117/77  Pulse:  132 128 128  Temp: 100.1 F (37.8 C)  99.1 F (37.3 C) 102.2 F (39 C)  TempSrc: Oral  Oral Rectal  Resp:  17 23 12   SpO2:  96% 100% 96%   Blood pressure 117/77, pulse 128, temperature 102.2 F (39 C), temperature source Rectal, resp. rate 12, SpO2 96.00%.  GEN:  Pleasant chronic appearing male person lying in the stretcher in no acute distress; cooperative with exam PSYCH:  alert and oriented x4; does not appear anxious does not appear depressed; affect is normal HEENT: Mucous membranes pink and anicteric; PERRLA; EOM intact; no cervical lymphadenopathy nor thyromegaly or carotid bruit; no  JVD; Breasts:: Not examined CHEST WALL: No tenderness CHEST: Normal respiration, clear to auscultation bilaterally HEART: Regular rate and rhythm; no murmurs rubs or gallops BACK: No kyphosis or scoliosis; no CVA tenderness ABDOMEN: Obese, soft non-tender; no masses, no organomegaly, normal abdominal bowel sounds; no pannus; no intertriginous candida. Rectal  Exam: Not done EXTREMITIES: He is quite weak his lower extremity. No pain. He has muscle atrophy and several decubitus and pressure sores, some with evidence of infection as well. Genitalia: not examined PULSES: 2+ and symmetric SKIN: Normal hydration no rash or ulceration CNS: Cranial nerves 2-12 grossly intact no focal neurologic deficit   Labs & Imaging Results for orders placed during the hospital encounter of 07/25/11 (from the past 48 hour(s))  CBC     Status: Abnormal   Collection Time   07/25/11  8:40 PM      Component Value Range Comment   WBC 12.5 (*) 4.0 - 10.5 (K/uL)    RBC 5.15  4.22 - 5.81 (MIL/uL)    Hemoglobin 13.5  13.0 - 17.0 (g/dL)    HCT 16.1  09.6 - 04.5 (%)    MCV 82.5  78.0 - 100.0 (fL)    MCH 26.2  26.0 - 34.0 (pg)    MCHC 31.8  30.0 - 36.0 (g/dL)    RDW 40.9  81.1 - 91.4 (%)    Platelets 474 (*) 150 - 400 (K/uL)   DIFFERENTIAL     Status: Abnormal   Collection Time   07/25/11  8:40 PM      Component Value Range Comment   Neutrophils Relative 79 (*) 43 - 77 (%)    Neutro Abs 9.9 (*) 1.7 - 7.7 (K/uL)    Lymphocytes Relative 11 (*) 12 - 46 (%)    Lymphs Abs 1.4  0.7 - 4.0 (K/uL)    Monocytes Relative 9  3 - 12 (%)    Monocytes Absolute 1.1 (*) 0.1 - 1.0 (K/uL)    Eosinophils Relative 1  0 - 5 (%)    Eosinophils Absolute 0.1  0.0 - 0.7 (K/uL)    Basophils Relative 0  0 - 1 (%)    Basophils Absolute 0.1  0.0 - 0.1 (K/uL)   COMPREHENSIVE METABOLIC PANEL     Status: Abnormal   Collection Time   07/25/11  8:40 PM      Component Value Range Comment   Sodium 134 (*) 135 - 145 (mEq/L)    Potassium 3.8  3.5 - 5.1 (mEq/L)    Chloride 93 (*) 96 - 112 (mEq/L)    CO2 30  19 - 32 (mEq/L)    Glucose, Bld 101 (*) 70 - 99 (mg/dL)    BUN 19  6 - 23 (mg/dL)    Creatinine, Ser 7.82  0.50 - 1.35 (mg/dL)    Calcium 9.6  8.4 - 10.5 (mg/dL)    Total Protein 7.7  6.0 - 8.3 (g/dL)    Albumin 2.8 (*) 3.5 - 5.2 (g/dL)    AST 40 (*) 0 - 37 (U/L)    ALT 28  0 - 53 (U/L)     Alkaline Phosphatase 78  39 - 117 (U/L)    Total Bilirubin 0.7  0.3 - 1.2 (mg/dL)    GFR calc non Af Amer >90  >90 (mL/min)    GFR calc Af Amer >90  >90 (mL/min)   CK     Status: Abnormal   Collection Time   07/25/11  8:40 PM  Component Value Range Comment   Total CK 1526 (*) 7 - 232 (U/L)    No results found.    Assessment Present on Admission:  .Rhabdomyolysis .Multiple sclerosis .Dehydration Decubitus Cellulitis SNIF placement    PLAN: He was given IV fluid with bicarbonate, and now will switch over to normal saline. I will resume his Betaseron and steroid. He does have cellulitis and will continue his clindamycin IV. He also appeared to be dehydrated and will continue his intravenous fluids. His rhabdomyolysis is quite mild. I have consulted wound care to help with management and social service for [facility] placement. He is stable, full code, and will be admitted to [facility] team 3.   Other plans as per orders.   Dhana Totton 07/26/2011, 1:12 AM

## 2011-07-26 NOTE — ED Notes (Addendum)
Pt cleaned of urine Diaper on pt. Pt repositioned on left side. Dr. Hyman Hopes in to assess decubitus.

## 2011-07-27 LAB — BASIC METABOLIC PANEL
CO2: 25 mEq/L (ref 19–32)
Chloride: 105 mEq/L (ref 96–112)
Potassium: 3.6 mEq/L (ref 3.5–5.1)
Sodium: 134 mEq/L — ABNORMAL LOW (ref 135–145)

## 2011-07-27 LAB — CK TOTAL AND CKMB (NOT AT ARMC): Relative Index: 0.5 (ref 0.0–2.5)

## 2011-07-27 MED ORDER — HYDROCODONE-ACETAMINOPHEN 5-325 MG PO TABS
1.0000 | ORAL_TABLET | ORAL | Status: DC | PRN
  Administered 2011-07-28 – 2011-08-04 (×15): 1 via ORAL
  Filled 2011-07-27 (×15): qty 1

## 2011-07-27 MED ORDER — ACETAMINOPHEN 325 MG PO TABS
650.0000 mg | ORAL_TABLET | Freq: Four times a day (QID) | ORAL | Status: DC | PRN
  Administered 2011-07-28 – 2011-08-04 (×10): 650 mg via ORAL
  Filled 2011-07-27 (×10): qty 2

## 2011-07-27 MED ORDER — AMANTADINE HCL 100 MG PO CAPS
100.0000 mg | ORAL_CAPSULE | Freq: Two times a day (BID) | ORAL | Status: DC
  Administered 2011-07-27 – 2011-08-04 (×16): 100 mg via ORAL
  Filled 2011-07-27 (×21): qty 1

## 2011-07-27 NOTE — Progress Notes (Signed)
PROGRESS NOTE  [name] Mccoy:[id number] DOB: 03-31-1961 DOA: 07/25/2011 PCP: No primary provider on file. Neurologist: Avie Echevaria, M.D.  Brief narrative: 51 year old man with history of multiple sclerosis. He presented to the emergency department with a history of a fall approximately 5 days prior to admission. He was unable to get up and therefore had been laying on his right side since that time. In the emergency department he was noted to be febrile and have cellulitis. Other conditions included dehydration and rhabdomyolysis.  Summary of records:  07/10/2011 emergency department visit: Fall. Mild rhabdomyolysis. Patient was given IV fluids and discharged.  Past medical history: Multiple sclerosis, chronic bilateral hip pain  Consultants:  None  Procedures:  None  Antibiotics:  March 9-9: Clindamycin  March 9: Levaquin  Interim History: Interval documentation reviewed. CK-MB now within normal limits. Total CK trending down. Now afebrile 24 hours.  Subjective: Feels better. Less hip soreness.  Objective: Filed Vitals:   07/26/11 1359 07/26/11 2020 07/27/11 0603 07/27/11 1400  BP: 111/70 110/65 106/66 122/79  Pulse: 111 117 103 111  Temp: 99 F (37.2 C) 98.4 F (36.9 C) 98.2 F (36.8 C) 98.3 F (36.8 C)  TempSrc: Oral Oral Oral Oral  Resp: 18 18 16 17   Height:      Weight:      SpO2: 96% 97% 98% 98%    Intake/Output Summary (Last 24 hours) at 07/27/11 1505 Last data filed at 07/27/11 1357  Gross per 24 hour  Intake   3525 ml  Output   1800 ml  Net   1725 ml    Exam:   General:  Appears calm and comfortable.  Cardiovascular: Regular rate and rhythm. No murmur, rub, gallop. No lower extremity edema.  Respiratory: Clear to auscultation bilaterally. No wheezes, rales, rhonchi. Normal respiratory effort.  Skin: Ulcers bandaged.  Psychiatric: Grossly normal mood and affect. Speech fluent and clear.   Data Reviewed: Basic Metabolic Panel:  Lab  07/27/11 0422 07/26/11 0538 07/25/11 2040  NA 134* 135 134*  K 3.6 4.0 --  CL 105 103 93*  CO2 25 25 30   GLUCOSE 107* 188* 101*  BUN 16 17 19   CREATININE 0.54 0.57 0.63  CALCIUM 7.6* 8.2* 9.6  MG -- -- --  PHOS -- -- --   Liver Function Tests:  Lab 07/25/11 2040  AST 40*  ALT 28  ALKPHOS 78  BILITOT 0.7  PROT 7.7  ALBUMIN 2.8*   CBC:  Lab 07/26/11 0538 07/25/11 2040  WBC 12.1* 12.5*  NEUTROABS -- 9.9*  HGB 11.8* 13.5  HCT 35.9* 42.5  MCV 81.4 82.5  PLT 406* 474*   Cardiac Enzymes:  Lab 07/27/11 0422 07/25/11 2040  CKTOTAL 587* 1526*  CKMB 3.2 --  CKMBINDEX -- --  TROPONINI -- --    Recent Results (from the past 240 hour(s))  MRSA PCR SCREENING     Status: Normal   Collection Time   07/26/11  5:21 AM      Component Value Range Status Comment   MRSA by PCR NEGATIVE  NEGATIVE  Final      Studies:  Scheduled Meds:    . amantadine  100 mg Oral BID  . calcium carbonate  1 tablet Oral Daily  . dantrolene  25 mg Oral TID  . enoxaparin  40 mg Subcutaneous Q24H  . gabapentin  600 mg Oral QID  . interferon beta-1b  0.25 mg Subcutaneous QODAY  . levofloxacin (LEVAQUIN) IV  750 mg Intravenous Q24H  .  modafinil  200 mg Oral Daily  . DISCONTD: amantadine  200 mg Oral Daily   Continuous Infusions:    . sodium chloride 150 mL/hr at 07/27/11 1357  . DISCONTD: dextrose 5 % and 0.9% NaCl 100 mL/hr at 07/26/11 1342    EKG March 8: Independently reviewed. Sinus tachycardia. No acute changes.   Assessment/Plan: 1. Fall: secondary to weakness from multiple sclerosis. Physical therapy consultation. 2. Dehydration: Resolving with IV fluids. 3. Rhabdomyolysis: Nearly resolved. Continue IV fluids. 4. Cellulitis: Continue IV antibiotics.  5. Multiple ulcers on back, present on admission 6. Multiple sclerosis  Discontinue Foley catheter March 11.   Code Status: Full code Family Communication: None present. Disposition Plan: Pending further evaluation and  treatment. Skilled nursing facility anticipated.   Brendia Sacks, MD  [facility] Pager [phone] 07/27/2011, 3:05 PM    LOS: 2 days

## 2011-07-28 ENCOUNTER — Inpatient Hospital Stay (HOSPITAL_COMMUNITY): Payer: BC Managed Care – PPO

## 2011-07-28 DIAGNOSIS — L89209 Pressure ulcer of unspecified hip, unspecified stage: Secondary | ICD-10-CM

## 2011-07-28 LAB — URINE MICROSCOPIC-ADD ON

## 2011-07-28 LAB — URINALYSIS, ROUTINE W REFLEX MICROSCOPIC
Ketones, ur: NEGATIVE mg/dL
Leukocytes, UA: NEGATIVE
Nitrite: NEGATIVE
Specific Gravity, Urine: 1.01 (ref 1.005–1.030)
pH: 7 (ref 5.0–8.0)

## 2011-07-28 MED ORDER — SODIUM CHLORIDE 0.9 % IV SOLN
INTRAVENOUS | Status: DC
  Administered 2011-07-28 – 2011-07-31 (×4): via INTRAVENOUS

## 2011-07-28 MED ORDER — IOHEXOL 300 MG/ML  SOLN
100.0000 mL | Freq: Once | INTRAMUSCULAR | Status: AC | PRN
  Administered 2011-07-28: 75 mL via INTRAVENOUS

## 2011-07-28 MED ORDER — PIPERACILLIN-TAZOBACTAM 3.375 G IVPB
3.3750 g | Freq: Three times a day (TID) | INTRAVENOUS | Status: DC
  Administered 2011-07-28 – 2011-08-04 (×21): 3.375 g via INTRAVENOUS
  Filled 2011-07-28 (×24): qty 50

## 2011-07-28 MED ORDER — VANCOMYCIN HCL IN DEXTROSE 1-5 GM/200ML-% IV SOLN
1000.0000 mg | Freq: Three times a day (TID) | INTRAVENOUS | Status: DC
  Administered 2011-07-28 – 2011-08-03 (×18): 1000 mg via INTRAVENOUS
  Filled 2011-07-28 (×21): qty 200

## 2011-07-28 MED ORDER — COLLAGENASE 250 UNIT/GM EX OINT
TOPICAL_OINTMENT | Freq: Every day | CUTANEOUS | Status: DC
  Administered 2011-07-28 – 2011-08-04 (×7): via TOPICAL
  Filled 2011-07-28 (×3): qty 30

## 2011-07-28 NOTE — Progress Notes (Signed)
VS Temp 102.5- HR 124- RR 16- BP 97/63. O2 sat 97% on room air. M.Lynch (on call for [facility]) notified. No new orders. Will administer Tylenol.

## 2011-07-28 NOTE — Progress Notes (Signed)
Pt has multiple wounds, bilateral hips, rt knee and rt side of back, changed all dressings, bathed pt and applied santyl ointment to all wounds and covered with alleven dressings.

## 2011-07-28 NOTE — Progress Notes (Signed)
Patient seen and examined.  As per [name] Mccoy note.  May eventually need hydrotherapy or debridement of some of the ulcers.

## 2011-07-28 NOTE — Consult Note (Signed)
Reason for Consult:Multijple  Skin ulcers, skin necrosis Referring Physician: [name] is an 51 y.o. male.  HPI: This is rather complicated 51 year old gentleman with history of multiple sclerosis. He lives at home with his mother and apparently fell off the couch about 2 weeks ago. He was hospitalized for 24 hours and then sent home. Since then he is reached the point where he wasn't able to get in bed. He had actually been on the floor for 3 days when he was finally brought to the emergency room and [facility] 07/25/11. He was admitted around 1 AM with rhabdomyolysis, dehydration, decubitus with cellulitis. He initially had a low-grade temperature of 100.1,  Then up to 101.5 but his fever trended down. This morning he spiked a temperature to 102, and at the time of exam his temperature is 102.9. He was seen by the wound care nurse who had done an  excellent job of  documenting his multiple wound/ulcer sites. We were asked to see in consultation for possible debridement, and drainage of abscess.  Past Medical History  Diagnosis Date  . Multiple sclerosis     History reviewed. No pertinent past surgical history.  History reviewed. No pertinent family history.  Social History:  reports that he has never smoked. He does not have any smokeless tobacco history on file. He reports that he does not drink alcohol or use illicit drugs.  Allergies: No Known Allergies  Medications:  Prior to Admission:  Prescriptions prior to admission  Medication Sig Dispense Refill  . amantadine (SYMMETREL) 100 MG capsule Take 200 mg by mouth daily.      . calcium carbonate (OS-CAL - DOSED IN MG OF ELEMENTAL CALCIUM) 1250 MG tablet Take 1 tablet by mouth daily.      . dantrolene (DANTRIUM) 25 MG capsule Take 25 mg by mouth 3 (three) times daily.      Marland Kitchen gabapentin (NEURONTIN) 600 MG tablet Take 600 mg by mouth 4 (four) times daily.      . interferon beta-1b (BETASERON) 0.3 MG injection  Inject 0.25 mg into the skin every other day. Next dose 07-11-2011      . modafinil (PROVIGIL) 200 MG tablet Take 200 mg by mouth daily.       Scheduled:   . amantadine  100 mg Oral BID  . calcium carbonate  1 tablet Oral Daily  . collagenase   Topical Daily  . dantrolene  25 mg Oral TID  . enoxaparin  40 mg Subcutaneous Q24H  . gabapentin  600 mg Oral QID  . interferon beta-1b  0.25 mg Subcutaneous QODAY  . modafinil  200 mg Oral Daily  . piperacillin-tazobactam (ZOSYN)  IV  3.375 g Intravenous Q8H  . vancomycin  1,000 mg Intravenous Q8H  . DISCONTD: levofloxacin (LEVAQUIN) IV  750 mg Intravenous Q24H   Continuous:   . sodium chloride    . DISCONTD: sodium chloride 150 mL/hr at 07/27/11 1649   ZOX:[id number], HYDROcodone-acetaminophen Anti-infectives     Start     Dose/Rate Route Frequency Ordered Stop   07/28/11 1800   vancomycin (VANCOCIN) IVPB 1000 mg/200 mL premix        1,000 mg 200 mL/hr over 60 Minutes Intravenous Every 8 hours 07/28/11 1647     07/28/11 1800  piperacillin-tazobactam (ZOSYN) IVPB 3.375 g       3.375 g 12.5 mL/hr over 240 Minutes Intravenous Every 8 hours 07/28/11 1647     07/26/11 1600   Levofloxacin (  LEVAQUIN) IVPB 750 mg  Status:  Discontinued        750 mg 100 mL/hr over 90 Minutes Intravenous Every 24 hours 07/26/11 1520 07/28/11 1625   07/26/11 0015   clindamycin (CLEOCIN) IVPB 900 mg        900 mg 100 mL/hr over 30 Minutes Intravenous  Once 07/26/11 0009 07/26/11 0105          Results for orders placed during the hospital encounter of 07/25/11 (from the past 48 hour(s))  CK TOTAL AND CKMB     Status: Abnormal   Collection Time   07/27/11  4:22 AM      Component Value Range Comment   Total CK 587 (*) 7 - 232 (U/L)    CK, MB 3.2  0.3 - 4.0 (ng/mL)    Relative Index 0.5  0.0 - 2.5    BASIC METABOLIC PANEL     Status: Abnormal   Collection Time   07/27/11  4:22 AM      Component Value Range Comment   Sodium 134 (*) 135 - 145 (mEq/L)     Potassium 3.6  3.5 - 5.1 (mEq/L)    Chloride 105  96 - 112 (mEq/L)    CO2 25  19 - 32 (mEq/L)    Glucose, Bld 107 (*) 70 - 99 (mg/dL)    BUN 16  6 - 23 (mg/dL)    Creatinine, Ser 1.61  0.50 - 1.35 (mg/dL)    Calcium 7.6 (*) 8.4 - 10.5 (mg/dL)    GFR calc non Af Amer >90  >90 (mL/min)    GFR calc Af Amer >90  >90 (mL/min)     No results found.  Review of Systems  Constitutional: Positive for fever. Negative for chills.  HENT: Negative.   Eyes: Negative.   Respiratory: Negative.   Cardiovascular: Negative.   Gastrointestinal: Negative.   Genitourinary: Negative.   Musculoskeletal: Negative.   Skin: Negative.   Neurological: Negative.   Endo/Heme/Allergies: Negative.   Psychiatric/Behavioral: Negative.    Blood pressure 121/74, pulse 123, temperature 98.8 F (37.1 C), temperature source Oral, resp. rate 18, height 6\' 3"  (1.905 m), weight 64.3 kg (141 lb 12.1 oz), SpO2 100.00%. Physical Exam  Constitutional: He is oriented to person, place, and time.       Cachectic AAM, feels febrile, tachycardic, HR 140'S  BP 97/62  HENT:  Head: Normocephalic and atraumatic.  Eyes: Conjunctivae and EOM are normal. Pupils are equal, round, and reactive to light. No scleral icterus.  Neck: Normal range of motion. Neck supple. No tracheal deviation present. No thyromegaly present.  Cardiovascular: Exam reveals no gallop and no friction rub.   No murmur heard. Respiratory: Effort normal and breath sounds normal. No respiratory distress. He has no wheezes. He has no rales. He exhibits no tenderness.  GI: Soft. Bowel sounds are normal. He exhibits no distension and no mass. There is no tenderness. There is no rebound and no guarding.  Musculoskeletal: He exhibits no edema and no tenderness.       He is in a fetal position, he says he stays this way to avoid dislocation of his kneecap on left.  Lymphadenopathy:    He has no cervical adenopathy.  Neurological: He is alert and oriented to person,  place, and time. He displays abnormal reflex. No cranial nerve deficit. He exhibits abnormal muscle tone. Coordination abnormal.  Skin:       Patient has multiple wounds. The  right hip:  has 3  areas, #1. 1 cmx1cm #2. 4x3cm, #3. 6x6cm, with some eschar, but none of it is thick, or draining,it's more like a skin burn.  Right BacK:  #1. 14 x 4, cm, #2. 1 x 3cm, #3.1x1 cm Again it varies between a burn to eschar,but no fluctulant areas noted no drainage.  L Knee:  wounds, but no eschar 1x1 cm  Right knee:  2 different wounds both about:  2.5x 2cm  Left hip: 1 cm wound no necrosis  Left foot: He has a small area where his right foot rest and it is starting to show some breakdown.  He is very skinny and I don't feel any fluctulant areas with any of the wounds/eschar noted.   Psychiatric: He has a normal mood and affect. His behavior is normal. Judgment and thought content normal.    Assessment/Plan: 1. Multiple sclerosis with marked debilitation. 2. Fall 2 weeks ago, he was hospitalized and sent home. Now presents with rhabdomyolysis after being on the floor for 72 hours or more at home. 3. Multiple wounds/ulcers of the skin/some skin necrosis as described above. 4. Sepsis, tachycardia, fever, hypotension. 5. Dehydration  Plan: He has multiple skin ulcers, with some skin necrosis. Despite this I don't see anything that I feel certain needs surgical debridement. He may have a deep infection which I can't palpate, but on exam I don't see anything that requires drainage. I have examined the patient with Dr. Irene Limbo of the hospitalist service. He's going to increase the spectrum of his antibiotic. Air mattress/KinAir bed is being arranged. Dr. Abbey Chatters will see tomorrow and evaluate. I will make him n.p.o. after midnight, but are not she is currently anything for him to debride.   [name] Mccoy 07/28/2011, 3:57 PM

## 2011-07-28 NOTE — Progress Notes (Signed)
CSW awaiting PT evaluation to fax to [employer] for insurance authorization. CSW went ahead and completed FL2, faxed information out to [facility], will provide bed offers in the am and will then proceed with insurance authorization process once therapy evaluation is complete.   Unice Bailey, LCSWA [phone]

## 2011-07-28 NOTE — Progress Notes (Signed)
ANTIBIOTIC CONSULT NOTE - INITIAL  Pharmacy Consult for Vancomycin/Zosyn Indication: Cellulitis  No Known Allergies  Patient Measurements: Height: 6\' 3"  (190.5 cm) Weight: 141 lb 12.1 oz (64.3 kg) IBW/kg (Calculated) : 84.5    Vital Signs: Temp: 98.8 F (37.1 C) (03/11 1448) Temp src: Oral (03/11 1448) BP: 121/74 mmHg (03/11 1448) Pulse Rate: 123  (03/11 1448) Intake/Output from previous day: 03/10 0701 - 03/11 0700 In: 4065 [P.O.:480; I.V.:3285; IV Piggyback:300] Out: 2850 [Urine:2850] Intake/Output from this shift: Total I/O In: 240 [P.O.:240] Out: 1000 [Urine:1000]  Labs:  Basename 07/27/11 0422 07/26/11 0538 07/25/11 2040  WBC -- 12.1* 12.5*  HGB -- 11.8* 13.5  PLT -- 406* 474*  LABCREA -- -- --  CREATININE 0.54 0.57 0.63   Estimated Creatinine Clearance: 101.6 ml/min (by C-G formula based on Cr of 0.54). Normalized CrCl  >178ml/min/1.73m2  Microbiology: Recent Results (from the past 720 hour(s))  MRSA PCR SCREENING     Status: Normal   Collection Time   07/26/11  5:21 AM      Component Value Range Status Comment   MRSA by PCR NEGATIVE  NEGATIVE  Final     Medical History: Past Medical History  Diagnosis Date  . Multiple sclerosis     Medications:  Anti-infectives     Start     Dose/Rate Route Frequency Ordered Stop   07/26/11 1600   Levofloxacin (LEVAQUIN) IVPB 750 mg  Status:  Discontinued        750 mg 100 mL/hr over 90 Minutes Intravenous Every 24 hours 07/26/11 1520 07/28/11 1625   07/26/11 0015   clindamycin (CLEOCIN) IVPB 900 mg        900 mg 100 mL/hr over 30 Minutes Intravenous  Once 07/26/11 0009 07/26/11 0105         Assessment:  [name] Mccoy w/ hx MS admitted 07/26/11 after fall, down for 5 days  Was on Levaquin, now to change to Vanc/Zosyn per pharmacy given cellulitis/persistent fevers  No cultures, Tm 102.5  Goal of Therapy:  Vancomycin trough level 10-15 mcg/ml Appropriate dose of zosyn  Plan:   Zosyn 3.375G IV q8h, each  dose over 4 hours  Vancomycin 1g IV q8h  Follow labs and vitals  Vanc tr at steady state  Adjust doses as necessary  Gwen Her PharmD  [phone] 07/28/2011 4:38 PM

## 2011-07-28 NOTE — Progress Notes (Signed)
PT Cancellation Note  Treatment cancelled today due to medical issues with patient which prohibited therapy per RN  Donnetta Hail 07/28/2011, 4:17 PM

## 2011-07-28 NOTE — Evaluation (Signed)
Occupational Therapy Evaluation Patient Details Name: [name] Mccoy MRN: [id number] DOB: September 01, 1960 Today's Date: 07/28/2011  Problem List:  Patient Active Problem List  Diagnoses  . Rhabdomyolysis  . Multiple sclerosis  . Dehydration    Past Medical History:  Past Medical History  Diagnosis Date  . Multiple sclerosis    Past Surgical History: History reviewed. No pertinent past surgical history.  OT Assessment/Plan/Recommendation OT Assessment Clinical Impression Statement: This 51 year old male has had MS x 33 years and is admitted with rhabdomyolysis after slipping from couch and remaining on floor for 5 days.  He is usually mod I from w/c level and does IADLs for himself and his mother, who has dementia.  He now requires A x 2 for bed mobility to roll completely on side for ADLs secondary to pain.  He is appropriate for skilled OT with mod A goals in acute OT Recommendation/Assessment: Patient will need skilled OT in the acute care venue OT Problem List: Decreased strength;Decreased activity tolerance;Pain Barriers to Discharge: Decreased caregiver support OT Therapy Diagnosis : Generalized weakness OT Plan OT Frequency: Min 1X/week OT Treatment/Interventions: Self-care/ADL training;Therapeutic exercise;DME and/or AE instruction;Patient/family education;Therapeutic activities OT Recommendation Follow Up Recommendations: Skilled nursing facility Equipment Recommended: Defer to next venue Individuals Consulted Consulted and Agree with Results and Recommendations: Patient OT Goals Acute Rehab OT Goals OT Goal Formulation: With patient Time For Goal Achievement: 2 weeks ADL Goals Pt Will Perform Lower Body Bathing: with mod assist;Supine, rolling right and/or left ADL Goal: Lower Body Bathing - Progress: Goal set today Arm Goals Pt Will Complete Theraband Exer: Independently;2 sets;Level 1 Theraband;10 reps;Other (comment) (to strength UEs for transfers/rolling for  adls) Arm Goal: Theraband Exercises - Progress: Goal set today Miscellaneous OT Goals Miscellaneous OT Goal #1: Pt will sit unsupported EOB with max A for supine to sit and min guard for 5 minutes in prep for seated adls OT Goal: Miscellaneous Goal #1 - Progress: Goal set today Miscellaneous OT Goal #2: pt will roll to bil sides with mod A and rails for adls OT Goal: Miscellaneous Goal #2 - Progress: Goal set today  OT Evaluation Precautions/Restrictions  Restrictions Weight Bearing Restrictions: No Prior Functioning Home Living Lives With: Other (Comment) (mother) Receives Help From: Other (Comment) (pt helped mother: did all IADLs) Additional Comments: was on the floor x 5 days:  sore bil hips due to position; L knee dislocates per pt.  Has had MS x 33 years Prior Function Level of Independence: Independent with basic ADLs;Requires assistive device for independence;Other (comment);Independent with homemaking with wheelchair (w/c level) Comments:  (walked short distances with cane; mostly used walker) ADL ADL Eating/Feeding: Simulated;Independent Where Assessed - Eating/Feeding: Bed level Grooming: Simulated;Set up Where Assessed - Grooming: Supine, head of bed up Upper Body Bathing: Simulated;Set up Where Assessed - Upper Body Bathing: Supine, head of bed up Lower Body Bathing: Simulated;Other (comment);+2 Total assistance;Comment for patient % (50% bathing:  from positon of comfort: knees flexed) Where Assessed - Lower Body Bathing: Rolling right and/or left Upper Body Dressing: Simulated;Set up Where Assessed - Upper Body Dressing: Supine, head of bed up Lower Body Dressing: Simulated;+2 Total assistance;Comment for patient % (0) Where Assessed - Lower Body Dressing: Rolling right and/or left Toilet Transfer: Not assessed Toilet Transfer Method:  (bed pan +2, pt 25%) Toileting - Clothing Manipulation: Simulated;+1 Total assistance Where Assessed - Toileting Clothing  Manipulation: Rolling right and/or left Toileting - Hygiene: Simulated;Moderate assistance Where Assessed - Toileting Hygiene: Rolling right and/or left  Ambulation Related to ADLs: unable.  Pt can usually roll over:  A x 2  to roll L today; felt he could do more if he could work on stretching himself out ADL Comments: attempted to see pt again with PT but running fever; RN felt better to wait.  Assisted NT with placing bedpan under pt Vision/Perception  Vision - History Baseline Vision: Other (comment) (not tested) Cognition Cognition Overall Cognitive Status: Appears within functional limits for tasks assessed Orientation Level: Oriented X4 Sensation/Coordination   Extremity Assessment RUE Assessment RUE Assessment: Within Functional Limits (reports difficulty with writing; able to do buttons) LUE Assessment LUE Assessment: Within Functional Limits Mobility  Bed Mobility Bed Mobility: Yes Rolling Left: 1: +2 Total assist;Patient percentage (comment) (pt assists with upper body only pt 25%) Transfers Transfers: No Exercises   End of Session OT - End of Session Activity Tolerance: Patient limited by pain;Patient limited by fatigue;Other (comment) (pt has multiple areas of bedsores:  was on floor 5 days) Patient left: in bed;with call bell in reach General Behavior During Session: [facility] for tasks performed Cognition: [facility] for tasks performed [facility], OTR/L [phone] 07/28/2011  [name] Mccoy 07/28/2011, 4:31 PM

## 2011-07-28 NOTE — Progress Notes (Signed)
PROGRESS NOTE  [name] Mccoy:[id number] DOB: 01-Aug-1960 DOA: 07/25/2011 PCP: No primary provider on file. Neurologist: Avie Echevaria, M.D.  Brief narrative: 51 year old man with history of multiple sclerosis. He presented to the emergency department with a history of a fall approximately 5 days prior to admission. He was unable to get up and therefore had been laying on his right side since that time. In the emergency department he was noted to be febrile and have cellulitis. Other conditions included dehydration and rhabdomyolysis.  Summary of records:  07/10/2011 emergency department visit: Fall. Mild rhabdomyolysis. Patient was given IV fluids and discharged.  Past medical history: Multiple sclerosis, chronic bilateral hip pain  Consultants:  None  Procedures:  None  Antibiotics:  March 9-9: Clindamycin  March 9-11: Levaquin  March 11: Zosyn, Vancomycin  Interim History: Interval documentation reviewed. Febrile again and tachycardic. Wound care consultation reviewed.  Subjective: No complaints. No cough, no shortness of breath no pain.  Objective: Filed Vitals:   07/27/11 2010 07/28/11 0515 07/28/11 0635 07/28/11 1448  BP: 108/63 97/63  121/74  Pulse: 112 124  123  Temp: 98.8 F (37.1 C) 102.5 F (39.2 C) 99.7 F (37.6 C) 98.8 F (37.1 C)  TempSrc: Oral Oral  Oral  Resp: 18 16  18   Height:      Weight:      SpO2: 98% 97%  100%    Intake/Output Summary (Last 24 hours) at 07/28/11 1529 Last data filed at 07/28/11 1400  Gross per 24 hour  Intake    780 ml  Output   3850 ml  Net  -3070 ml    Exam:   General:  Appears calm and comfortable. Very pleasant.  Cardiovascular: Tachycardic, regular rhythm. No murmur, rub, gallop. No lower extremity edema.  Respiratory: Clear to auscultation bilaterally. No wheezes, rales, rhonchi. Normal respiratory effort.  Skin: All wounds inspected. Largest wounds are on the back and on the right hip. No fluctuance  with palpation and no exudates. Wounds on back, right hip, left hip and ankle processes burns rather than decubitus ulcers. No surrounding erythema and no tenderness with palpation of any wound.  Psychiatric: Grossly normal mood and affect. Speech fluent and clear.   Data Reviewed: Basic Metabolic Panel:  Lab 07/27/11 8119 07/26/11 0538 07/25/11 2040  NA 134* 135 134*  K 3.6 4.0 --  CL 105 103 93*  CO2 25 25 30   GLUCOSE 107* 188* 101*  BUN 16 17 19   CREATININE 0.54 0.57 0.63  CALCIUM 7.6* 8.2* 9.6  MG -- -- --  PHOS -- -- --   Liver Function Tests:  Lab 07/25/11 2040  AST 40*  ALT 28  ALKPHOS 78  BILITOT 0.7  PROT 7.7  ALBUMIN 2.8*   CBC:  Lab 07/26/11 0538 07/25/11 2040  WBC 12.1* 12.5*  NEUTROABS -- 9.9*  HGB 11.8* 13.5  HCT 35.9* 42.5  MCV 81.4 82.5  PLT 406* 474*   Cardiac Enzymes:  Lab 07/27/11 0422 07/25/11 2040  CKTOTAL 587* 1526*  CKMB 3.2 --  CKMBINDEX -- --  TROPONINI -- --    Recent Results (from the past 240 hour(s))  MRSA PCR SCREENING     Status: Normal   Collection Time   07/26/11  5:21 AM      Component Value Range Status Comment   MRSA by PCR NEGATIVE  NEGATIVE  Final      Studies:  Scheduled Meds:    . amantadine  100 mg Oral BID  .  calcium carbonate  1 tablet Oral Daily  . collagenase   Topical Daily  . dantrolene  25 mg Oral TID  . enoxaparin  40 mg Subcutaneous Q24H  . gabapentin  600 mg Oral QID  . interferon beta-1b  0.25 mg Subcutaneous QODAY  . levofloxacin (LEVAQUIN) IV  750 mg Intravenous Q24H  . modafinil  200 mg Oral Daily   Continuous Infusions:    . DISCONTD: sodium chloride 150 mL/hr at 07/27/11 1649    EKG March 8: Independently reviewed. Sinus tachycardia. No acute changes.   Assessment/Plan: 1. Fever: Afebrile for approximately 24 hours but now again febrile. Wounds inspected there is no definite evidence of cellulitis. No abscess seen. Largest wounds right hip and back. He did apparently was seen  earlier. We will check a CT scan of the pelvis including the upper portion of the lower extremity to exclude fluid collection. Broaden IV antibiotics. Check blood cultures. Check urinalysis. No respiratory symptoms or cough therefore no chest x-ray. 2. Cellulitis: As above. 3. Fall: secondary to weakness from multiple sclerosis. Physical therapy consultation. 4. Dehydration: Resolved. 5. Rhabdomyolysis: Resolved with IV fluids. 6. Multiple ulcers: Back, right and left hip. Present on admission. 7. Multiple sclerosis     Code Status: Full code Family Communication: None present. Disposition Plan: Pending further evaluation and treatment. Skilled nursing facility anticipated.   Brendia Sacks, MD  [facility] Pager [phone] 07/28/2011, 3:29 PM    LOS: 3 days

## 2011-07-28 NOTE — Progress Notes (Signed)
UR complete 

## 2011-07-28 NOTE — Progress Notes (Signed)
IV team notified to restart NSL for pt, IV team at bedside.

## 2011-07-28 NOTE — Consult Note (Addendum)
WOC consult Note Reason for Consult: Consult requested for multiple pressure ulcers.  Pt fell at home last week and lay on floor for unknown, prolonged period of time without food or water.  Pt did not realize how severe his wounds had become upon admission. Pt has MS and very limited mobility and is slightly contracted to legsm Wound type: Multiple unstageable wounds Pressure Ulcer POA: Yes Measurement: Right hip unstageable wounds; 4X3cm 6X6cm, 1X1cm.  All 100% eschar, fluctuant, strong foul odor, mod tan drainage. Right back 14X4cm and 1X3cm, 1X1cm, unstageable, all, 100% eschar, with same appearance, odor, and drainage. Left inner knee 1X1cm stage 2 wound pink and moist. Right outer knee 2.5X2cm, 100% yellow slough, small yellow drainage, no odor.   Right knee 2X2cm unstageable 100% eschar, small tan drainage, no odor. Left hip 1X1X.1cm, stage 2 wound pink and moist, no odor.  Dressing procedure/placement/frequency: RECOMMEND CONSULT TO CCS FOR SURGICAL DEBRIDEMENT OF RIGHT HIP WOUNDS R/T extensive amt nonviable tissue, and possibly also to back wounds. Santyl for chemical debridement of nonviable tissue until further input from CCS.  Foam dressings to areas without eschar.  Optimize nutrition.  Air mattress for pressure reduction.  Discussed plan of care with primary team.  Please reconsult if further assistance is needed.  Mardee Postin, RN, MSN, [employer]  [phone]

## 2011-07-29 LAB — BASIC METABOLIC PANEL
BUN: 6 mg/dL (ref 6–23)
Chloride: 104 mEq/L (ref 96–112)
Creatinine, Ser: 0.52 mg/dL (ref 0.50–1.35)
GFR calc Af Amer: >90 mL/min (ref >90)

## 2011-07-29 LAB — CBC
HCT: 33.3 % — ABNORMAL LOW (ref 39.0–52.0)
MCH: 26.9 pg (ref 26.0–34.0)
MCV: 82.8 fL (ref 78.0–100.0)
RDW: 14.2 % (ref 11.5–15.5)
WBC: 9.6 10*3/uL (ref 4.0–10.5)

## 2011-07-29 LAB — URINE CULTURE: Culture  Setup Time: 201303120233

## 2011-07-29 MED ORDER — POLYETHYLENE GLYCOL 3350 17 G PO PACK
17.0000 g | PACK | Freq: Every day | ORAL | Status: DC
  Administered 2011-07-30 – 2011-08-04 (×6): 17 g via ORAL
  Filled 2011-07-29 (×7): qty 1

## 2011-07-29 NOTE — Evaluation (Signed)
Physical Therapy Evaluation Patient Details Name: [name] Mccoy MRN: [id number] DOB: Apr 22, 1961 Today's Date: 07/29/2011  Problem List:  Patient Active Problem List  Diagnoses  . Rhabdomyolysis  . Multiple sclerosis  . Dehydration    Past Medical History:  Past Medical History  Diagnosis Date  . Multiple sclerosis    Past Surgical History: History reviewed. No pertinent past surgical history.  PT Assessment/Plan/Recommendation PT Assessment Clinical Impression Statement: pt with history or MS and falls with wounds and rhabdo. 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  He is limited by increased pain in both hips and groin due to ??? possiblty prolonged position of hips while pt. was on the floor. Pt will need post acute rehab to regain his independence for home.  Pt also being followed for pulsed lavage with suction for wound care  PT Recommendation/Assessment: Patient will need skilled PT in the acute care venue PT Problem List: Decreased range of motion;Decreased activity tolerance;Other (comment);Decreased strength;Decreased mobility Barriers to Discharge: Decreased caregiver support PT Therapy Diagnosis : Acute pain;Generalized weakness PT Plan PT Frequency: Min 3X/week PT Treatment/Interventions: Therapeutic exercise;Functional mobility training;Therapeutic activities;DME instruction PT Recommendation Recommendations for Other Services: Rehab consult;OT consult Follow Up Recommendations: Inpatient Rehab;Skilled nursing facility;LTACH Equipment Recommended: Defer to next venue PT Goals  Acute Rehab PT Goals PT Goal Formulation: With patient Time For Goal Achievement: 2 weeks Pt will Roll Supine to Right Side: with min assist PT Goal: Rolling Supine to Right Side - Progress: Goal set today Pt will go Supine/Side to Sit: with min assist PT Goal: Supine/Side to Sit - Progress: Goal set  today Pt will go Sit to Supine/Side: with min assist PT Goal: Sit to Supine/Side - Progress: Goal set today Pt will Transfer Bed to Chair/Chair to Bed: with min assist PT Transfer Goal: Bed to Chair/Chair to Bed - Progress: Goal set today  PT Evaluation Precautions/Restrictions  Precautions Precautions: Fall Precaution Comments: pt with MS Required Braces or Orthoses: No Restrictions Weight Bearing Restrictions:  (pt reports he has chronically dislocating left kneecap) Other Position/Activity Restrictions: pt keeps left knee drawn up into chest with max hip flexion and knee flexion ot protect kneecap from dislocating Prior Functioning  Home Living Lives With: Alone (mother with demetia usually at ALF currently with him) Receives Help From: Family Type of Home: House Home Layout: Other (Comment) (pt reports house is fixed up so that he can be independent) Home Adaptive Equipment: Wheelchair - manual;Straight cane (sliding board) Additional Comments: was on the floor x 5 days:  sore bil hips due to position; L knee dislocates per pt.  Has had MS x 33 years Prior Function Level of Independence: Independent with transfers Cognition Cognition Arousal/Alertness: Awake/alert Overall Cognitive Status: Appears within functional limits for tasks assessed Orientation Level: Oriented X4 Cognition - Other Comments: pt very pleasant, c/o pain in both hips that is keeping him from stretching right leg out and he does not want to even try to stretch out left leg Sensation/Coordination Sensation Hot/Cold: Appears Intact Extremity Assessment RLE Assessment RLE Assessment: Exceptions to [facility] RLE AROM (degrees) RLE Overall AROM Comments: pt attempts to actively extend right leg, but can only achieve to about 45 degrees of hip flexion.  Pt too painful to allow passive ROM of hip and knee. He says that he sometimes has effects of spasms in legs LLE Assessment LLE Assessment: Exceptions to [facility] LLE  AROM (degrees) LLE Overall AROM  Comments: pt keeps leg drawn up into max hip flexion and extension and will not allow any active or passive attempts at extension Mobility (including Balance) Bed Mobility Bed Mobility: Yes Rolling Right: 1: +2 Total assist;Patient percentage (comment) (pt ~50%) Rolling Right Details (indicate cue type and reason): pt limited by pain and difficulty moving on air mattress.  He holds onto both knees in flexed postition and tries to roll over as a unit Rolling Left: 1: +2 Total assist;Other (comment) (pt ~ 50%. ) Rolling Left Details (indicate cue type and reason): pt with pain, holds both knees up in flexed position and tries to roll over on air mattress with difficulty Transfers Transfers: No Ambulation/Gait Ambulation/Gait: No Stairs: No Wheelchair Mobility Wheelchair Mobility: No  Posture/Postural Control Posture/Postural Control: Postural limitations Postural Limitations: pt with diffuse muscles atrophy.  pt with multiple wounds on back and hips.  Pt unable to straighten either leg due to pain groin Balance Balance Assessed: No Exercise    End of Session PT - End of Session Activity Tolerance: Patient limited by pain Patient left: in bed;with call bell in reach General Behavior During Session: [facility] for tasks performed (will need pain med prior to next session ) Cognition: [facility] for tasks performed  Donnetta Hail 07/29/2011, 1:21 PM

## 2011-07-29 NOTE — Progress Notes (Signed)
ANTIBIOTIC CONSULT NOTE - FOLLOW-UP  Pharmacy Consult for Vancomycin/Zosyn Indication: Cellulitis  No Known Allergies  Patient Measurements: Height: 6\' 3"  (190.5 cm) Weight: 141 lb 12.1 oz (64.3 kg) IBW/kg (Calculated) : 84.5    Vital Signs: Temp: 99.5 F (37.5 C) (03/12 1331) Temp src: Oral (03/12 1331) BP: 104/69 mmHg (03/12 1331) Pulse Rate: 107  (03/12 1331) Intake/Output from previous day: 03/11 0701 - 03/12 0700 In: 840 [P.O.:840] Out: 3450 [Urine:3450] Intake/Output from this shift: Total I/O In: 2070 [P.O.:840; I.V.:980; IV Piggyback:250] Out: 2120 [Urine:2120]  Labs:  [facility] 07/29/11 0400 07/27/11 0422  WBC 9.6 --  HGB 10.8* --  PLT 375 --  LABCREA -- --  CREATININE 0.52 0.54   Estimated Creatinine Clearance: 101.6 ml/min (by C-G formula based on Cr of 0.52). Normalized CrCl  >11ml/min/1.73m2  Microbiology: Recent Results (from the past 720 hour(s))  MRSA PCR SCREENING     Status: Normal   Collection Time   07/26/11  5:21 AM      Component Value Range Status Comment   MRSA by PCR NEGATIVE  NEGATIVE  Final     Medical History: Past Medical History  Diagnosis Date  . Multiple sclerosis     Medications:  Anti-infectives     Start     Dose/Rate Route Frequency Ordered Stop   07/28/11 1800   vancomycin (VANCOCIN) IVPB 1000 mg/200 mL premix        1,000 mg 200 mL/hr over 60 Minutes Intravenous Every 8 hours 07/28/11 1647     07/28/11 1800   piperacillin-tazobactam (ZOSYN) IVPB 3.375 g        3.375 g 12.5 mL/hr over 240 Minutes Intravenous Every 8 hours 07/28/11 1647     07/26/11 1600   Levofloxacin (LEVAQUIN) IVPB 750 mg  Status:  Discontinued        750 mg 100 mL/hr over 90 Minutes Intravenous Every 24 hours 07/26/11 1520 07/28/11 1625   07/26/11 0015   clindamycin (CLEOCIN) IVPB 900 mg        900 mg 100 mL/hr over 30 Minutes Intravenous  Once 07/26/11 0009 07/26/11 0105         Assessment:  [name] Mccoy w/ hx MS admitted 07/26/11 after  fall, down for 5 days  Day 2 Vanc/Zosyn per pharmacy for cellulitis/persistent fevers  No cultures, Temp = 99.5 F  Vancomycin trough level therapeutic (13.2 mcg/ml)  Goal of Therapy:  Vancomycin trough level 10-15 mcg/ml Appropriate dose of zosyn  Plan:   Zosyn 3.375G IV q8h, each dose over 4 hours  Continue Vancomycin 1g IV q8h  Follow labs and vitals  Terrilee Files, PharmD 07/29/2011 6:30 PM

## 2011-07-29 NOTE — Progress Notes (Signed)
CSW spoke with patient re: discharge planning. Noted PT recommending inpatient rehab vs. SNF vs. LTACH. CSW had completed FL2 and faxed out to facilities in [facility]. CSW provided patient with bed offers and patient expressed interest in [facility]. CSW faxed clinical information to [employer] who will obtain insurance authorization. CSW will follow-up.  Unice Bailey, LCSWA [phone]

## 2011-07-29 NOTE — Progress Notes (Signed)
Patient seen and examined.  Will debride the right hip wound at the bedside.  The other wound could benefit from some hydrotherapy.

## 2011-07-29 NOTE — Progress Notes (Signed)
Subjective: Temp improving, BP stable HR 110-130 range.  Labs OK, no rise in WBC. CT shows:   Impaction, with thickening of rectum and edema, allot of stool in colon.  Some air in the subcutaneous fat ischial region. No abscess or osteomyelitis noted.   Pt. Examined early by DR.Rosenbower and my self.  Objective: Vital signs in last 24 hours: Temp:  [98.8 F (37.1 C)-102.9 F (39.4 C)] 100 F (37.8 C) (03/12 0438) Pulse Rate:  [110-130] 110  (03/12 0438) Resp:  [18-20] 18  (03/12 0438) BP: (99-121)/(62-74) 99/62 mmHg (03/12 0438) SpO2:  [97 %-100 %] 98 % (03/12 0438) Last BM Date: 07/25/11  Intake/Output from previous day: 03/11 0701 - 03/12 0700 In: 840 [P.O.:840] Out: 3450 [Urine:3450] Intake/Output this shift:    PE:  Now on air bed.  Wounds on right and left sides examined.  The back could use some hydro therapy, the Right hip will use hydro and some local bedside debridement.  Lab Results:   Basename 07/29/11 0400  WBC 9.6  HGB 10.8*  HCT 33.3*  PLT 375    BMET  Basename 07/29/11 0400 07/27/11 0422  NA 136 134*  K 3.6 3.6  CL 104 105  CO2 26 25  GLUCOSE 135* 107*  BUN 6 16  CREATININE 0.52 0.54  CALCIUM 8.0* 7.6*   PT/INR No results found for this basename: LABPROT:2,INR:2 in the last 72 hours   Studies/Results: Ct Pelvis W Contrast  07/29/2011  *RADIOLOGY REPORT*  Clinical Data:  Pressures for over the right hip  CT PELVIS WITHOUT CONTRAST  Technique:  Multidetector CT imaging of the pelvis was performed following the standard protocol without intravenous contrast.  Comparison:   None.  Findings:  Gallstones are suspected.  The visualized liver is within normal limits.  Extensive stool throughout colon.  There are tiny calcific densities within the subcutaneous fat of the ischial region.  Air bubbles are seen within the subcutaneous fat adjacent to the calcifications of unknown significance.  There is some thickening of the gluteus maximus muscle.  There  is no abscess.  Stranding in the subcutaneous fat of the right buttock is noted.  Large amount of stool in the rectum. There is wall thickening of the rectum and edema.  Foley catheter decompresses the bladder.  The scrotal sac is prominent and may contain hydrocele.  No destructive bone lesion.  No acute fracture or dislocation. Degenerative changes in the spine are noted.  IMPRESSION: Cholelithiasis is suspected.  Fecal impaction in the rectum is noted.  The rectal wall is edematous and thickened.  No evidence of subcutaneous abscess.  Stranding in the subcutaneous fat of the buttock is noted which may represent cellulitis.  No convincing evidence of a destructive bony process to suggest osteomyelitis.  Scrotal hydrocele is suspected.  Original Report Authenticated By: Donavan Burnet, M.D.    Anti-infectives: Anti-infectives     Start     Dose/Rate Route Frequency Ordered Stop   07/28/11 1800   vancomycin (VANCOCIN) IVPB 1000 mg/200 mL premix        1,000 mg 200 mL/hr over 60 Minutes Intravenous Every 8 hours 07/28/11 1647     07/28/11 1800  piperacillin-tazobactam (ZOSYN) IVPB 3.375 g       3.375 g 12.5 mL/hr over 240 Minutes Intravenous Every 8 hours 07/28/11 1647     07/26/11 1600   Levofloxacin (LEVAQUIN) IVPB 750 mg  Status:  Discontinued        750 mg 100  mL/hr over 90 Minutes Intravenous Every 24 hours 07/26/11 1520 07/28/11 1625   07/26/11 0015   clindamycin (CLEOCIN) IVPB 900 mg        900 mg 100 mL/hr over 30 Minutes Intravenous  Once 07/26/11 0009 07/26/11 0105         Current Facility-Administered Medications  Medication Dose Route Frequency Provider Last Rate Last Dose  . 0.9 %  sodium chloride infusion   Intravenous Continuous Standley Brooking, MD 150 mL/hr at 07/29/11 0223    . acetaminophen (TYLENOL) tablet 650 mg  650 mg Oral Q6H PRN Standley Brooking, MD   650 mg at 07/28/11 2322  . amantadine (SYMMETREL) capsule 100 mg  100 mg Oral BID Standley Brooking, MD   100  mg at 07/28/11 2301  . calcium carbonate (OS-CAL - dosed in mg of elemental calcium) tablet 500 mg of elemental calcium  1 tablet Oral Daily Houston Siren, MD   500 mg of elemental calcium at 07/28/11 1016  . collagenase (SANTYL) ointment   Topical Daily Standley Brooking, MD      . dantrolene (DANTRIUM) capsule 25 mg  25 mg Oral TID Houston Siren, MD   25 mg at 07/28/11 2301  . enoxaparin (LOVENOX) injection 40 mg  40 mg Subcutaneous Q24H Houston Siren, MD   40 mg at 07/28/11 0831  . gabapentin (NEURONTIN) capsule 600 mg  600 mg Oral QID Houston Siren, MD   600 mg at 07/28/11 2301  . HYDROcodone-acetaminophen (NORCO) 5-325 MG per tablet 1 tablet  1 tablet Oral Q4H PRN Standley Brooking, MD   1 tablet at 07/28/11 1902  . interferon beta-1b (BETASERON) injection 0.25 mg  0.25 mg Subcutaneous QODAY Houston Siren, MD   0.25 mg at 07/28/11 1023  . iohexol (OMNIPAQUE) 300 MG/ML solution 100 mL  100 mL Intravenous Once PRN Medication Radiologist, MD   75 mL at 07/28/11 2156  . modafinil (PROVIGIL) tablet 200 mg  200 mg Oral Daily Houston Siren, MD   200 mg at 07/28/11 1026  . piperacillin-tazobactam (ZOSYN) IVPB 3.375 g  3.375 g Intravenous Q8H Gwen Her, PHARMD   3.375 g at 07/29/11 0222  . vancomycin (VANCOCIN) IVPB 1000 mg/200 mL premix  1,000 mg Intravenous Q8H Gwen Her, PHARMD   1,000 mg at 07/29/11 0222  . DISCONTD: Levofloxacin (LEVAQUIN) IVPB 750 mg  750 mg Intravenous Q24H Standley Brooking, MD   750 mg at 07/27/11 1722    Assessment/Plan 1. Multiple sclerosis with marked debilitation.  2. Fall 2 weeks ago, he was hospitalized and sent home. Now presents with rhabdomyolysis after being on the floor for 72 hours or more at home.  3. Multiple wounds/ulcers of the skin/some skin necrosis as described above.  4. Sepsis, tachycardia, fever, hypotension.  5. Dehydration  Plan:  He can eat.  Will ask PT to do hydro RX.  Do some local bedside deberiement later today.      LOS: 4 days     [name] Mccoy 07/29/2011

## 2011-07-29 NOTE — Progress Notes (Signed)
PROGRESS NOTE  [name] Mccoy ZOX:[id number] DOB: [birth year] DOA: 07/25/2011 PCP: No primary provider on file. Neurologist: Avie Echevaria, M.D.  Brief narrative: 51 year old man with history of multiple sclerosis. He presented to the emergency department with a history of a fall approximately 5 days prior to admission. He was unable to get up and therefore had been laying on his right side since that time. In the emergency department he was noted to be febrile and have cellulitis. Other conditions included dehydration and rhabdomyolysis.  He initially improved but then developed recurrent fever. There is concern for the possibility of abscess of the right hip. CT scan however was unremarkable. General surgery seen the patient in consultation and debridement is being contemplated. Patient continues on broad-spectrum antibiotic therapy. Ultimately the patient will be going to skilled nursing facility for rehabilitation.  Summary of records:  07/10/2011 emergency department visit: Fall. Mild rhabdomyolysis. Patient was given IV fluids and discharged.  Past medical history: Multiple sclerosis, chronic bilateral hip pain  Consultants:  General surgery  Physical therapy: Inpatient rehabilitation, skilled or LTACH  Occupational therapy: Skilled nursing facility  Wound care  Procedures:  None  Antibiotics:  March 9-9: Clindamycin  March 9-11: Levaquin  March 11: Zosyn, Vancomycin  Interim History: Interval documentation reviewed. Febrile last night. Leukocytosis resolved. Urinalysis negative. CT scan noted. Fecal impaction.  Subjective: No complaints.  Objective: Filed Vitals:   07/28/11 1916 07/28/11 2210 07/29/11 0142 07/29/11 0438  BP:  111/69 101/62 99/62  Pulse:  116 130 110  Temp: 102.4 F (39.1 C) 101.3 F (38.5 C) 101.1 F (38.4 C) 100 F (37.8 C)  TempSrc: Rectal Rectal Rectal Rectal  Resp:  20 18 18   Height:      Weight:      SpO2:  100% 97% 98%     Intake/Output Summary (Last 24 hours) at 07/29/11 1051 Last data filed at 07/29/11 0500  Gross per 24 hour  Intake    600 ml  Output   3450 ml  Net  -2850 ml    Exam:   General:  Appears calm and comfortable. Very pleasant.  Cardiovascular: Tachycardic, regular rhythm. No murmur, rub, gallop. No lower extremity edema.  Respiratory: Clear to auscultation bilaterally. No wheezes, rales, rhonchi. Normal respiratory effort.  Skin: Wounds not inspected today. Largest wounds are on the back and on the right hip. No fluctuance with palpation and no exudates. Wounds on back, right hip, left hip and ankle processes burns rather than decubitus ulcers. No surrounding erythema and no tenderness with palpation of any wound.  Psychiatric: Grossly normal mood and affect. Speech fluent and clear.   Data Reviewed: Basic Metabolic Panel:  Lab 07/29/11 8119 07/27/11 0422 07/26/11 0538 07/25/11 2040  NA 136 134* 135 134*  K 3.6 3.6 -- --  CL 104 105 103 93*  CO2 26 25 25 30   GLUCOSE 135* 107* 188* 101*  BUN 6 16 17 19   CREATININE 0.52 0.54 0.57 0.63  CALCIUM 8.0* 7.6* 8.2* 9.6  MG -- -- -- --  PHOS -- -- -- --   Liver Function Tests:  Lab 07/25/11 2040  AST 40*  ALT 28  ALKPHOS 78  BILITOT 0.7  PROT 7.7  ALBUMIN 2.8*   CBC:  Lab 07/29/11 0400 07/26/11 0538 07/25/11 2040  WBC 9.6 12.1* 12.5*  NEUTROABS -- -- 9.9*  HGB 10.8* 11.8* 13.5  HCT 33.3* 35.9* 42.5  MCV 82.8 81.4 82.5  PLT 375 406* 474*   Cardiac Enzymes:  Lab 07/27/11 0422 07/25/11 2040  CKTOTAL 587* 1526*  CKMB 3.2 --  CKMBINDEX -- --  TROPONINI -- --    Recent Results (from the past 240 hour(s))  MRSA PCR SCREENING     Status: Normal   Collection Time   07/26/11  5:21 AM      Component Value Range Status Comment   MRSA by PCR NEGATIVE  NEGATIVE  Final      Studies:  Scheduled Meds:    . amantadine  100 mg Oral BID  . calcium carbonate  1 tablet Oral Daily  . collagenase   Topical Daily  .  dantrolene  25 mg Oral TID  . enoxaparin  40 mg Subcutaneous Q24H  . gabapentin  600 mg Oral QID  . interferon beta-1b  0.25 mg Subcutaneous QODAY  . modafinil  200 mg Oral Daily  . piperacillin-tazobactam (ZOSYN)  IV  3.375 g Intravenous Q8H  . vancomycin  1,000 mg Intravenous Q8H  . DISCONTD: levofloxacin (LEVAQUIN) IV  750 mg Intravenous Q24H   Continuous Infusions:    . sodium chloride 150 mL/hr at 07/29/11 0857    EKG March 8: Independently reviewed. Sinus tachycardia. No acute changes.   Assessment/Plan: 1. Cellulitis: No evidence of abscess. Secondary to multiple wounds. Appreciate general surgery evaluation and treatment. Continue empiric antibiotics. 2. Anemia of acute illness: Stable. 3. Fecal impaction: Has had a bowel movement today. Daily MiraLAX. 4. Fall prior to admission: Secondary to weakness from multiple sclerosis. Skilled nursing facility on discharge. 5. Dehydration: Resolved. 6. Rhabdomyolysis: Resolved with IV fluids. 7. Multiple ulcers: Back, right and left hip. Present on admission. 8. Multiple sclerosis   Code Status: Full code Family Communication: None present. Disposition Plan: Pending further evaluation and treatment. Skilled nursing facility anticipated.   Brendia Sacks, MD  [facility] Pager [phone] 07/29/2011, 10:51 AM    LOS: 4 days

## 2011-07-29 NOTE — Progress Notes (Signed)
Physical Therapy Wound Treatment Patient Details  Name: [name] Mccoy MRN: [id number] Date of Birth: 26-Sep-1960  Today's Date: 07/29/2011 Time:  -     Subjective     Pain Score: Pain Score:   5  Wound Assessment  Pressure Ulcer 07/26/11 Stage III -  Full thickness tissue loss. Subcutaneous fat may be visible but bone, tendon or muscle are NOT exposed. measuring 7.5 width by 9.5 (Active)  State of Healing Eschar 07/29/2011 10:30 AM  Site / Wound Assessment Black;Pale 07/29/2011 10:30 AM  % Wound base Red or Granulating 0% 07/29/2011 10:30 AM  % Wound base Yellow 0% 07/29/2011 10:30 AM  % Wound base Black 100% 07/29/2011 10:30 AM  Peri-wound Assessment Intact 07/29/2011 10:30 AM  Tunneling (cm) no 07/29/2011 10:30 AM  Undermining (cm) no 07/29/2011 10:30 AM  Margins Unattacted edges (unapproximated) 07/29/2011 10:30 AM  Drainage Amount Moderate 07/29/2011 10:30 AM  Drainage Description Serosanguineous 07/29/2011 10:30 AM  Treatment Cleansed;Hydrotherapy (Pulse lavage);Other (Comment) 07/29/2011 10:30 AM  Dressing Type Moist to dry;Silicone dressing 07/29/2011 10:30 AM  Dressing Changed 07/29/2011 10:30 AM     Pressure Ulcer 07/26/11 Stage III -  Full thickness tissue loss. Subcutaneous fat may be visible but bone, tendon or muscle are NOT exposed. measuring 7.0 x 4 (Active)  State of Healing Eschar 07/29/2011 10:30 AM  Site / Wound Assessment Black;Granulation tissue;Yellow 07/29/2011 10:30 AM  % Wound base Red or Granulating 10% 07/29/2011 10:30 AM  % Wound base Yellow 40% 07/29/2011 10:30 AM  % Wound base Black 50% 07/29/2011 10:30 AM  Peri-wound Assessment Intact 07/29/2011 10:30 AM  Tunneling (cm) no 07/29/2011 10:30 AM  Undermining (cm) no 07/29/2011 10:30 AM  Margins Unattacted edges (unapproximated) 07/29/2011 10:30 AM  Drainage Amount Moderate 07/29/2011 10:30 AM  Drainage Description Serosanguineous 07/29/2011 10:30 AM  Treatment Cleansed;Debridement (Selective);Hydrotherapy (Pulse lavage)  07/29/2011 10:30 AM  Dressing Type Silicone dressing;Foam;Moist to dry 07/29/2011 10:30 AM  Dressing Changed 07/29/2011 10:30 AM     Pressure Ulcer 07/26/11 Stage II -  Partial thickness loss of dermis presenting as a shallow open ulcer with a red, pink wound bed without slough. measuring 23 x 6 (Active)  State of Healing Early/partial granulation 07/29/2011 10:30 AM  Site / Wound Assessment Black;Red;Yellow;Granulation tissue 07/29/2011 10:30 AM  % Wound base Red or Granulating 40% 07/29/2011 10:30 AM  % Wound base Yellow 10% 07/29/2011 10:30 AM  % Wound base Black 50% 07/29/2011 10:30 AM  Peri-wound Assessment Denuded 07/29/2011 10:30 AM  Tunneling (cm) no 07/29/2011 10:30 AM  Undermining (cm) no 07/29/2011 10:30 AM  Margins Unattacted edges (unapproximated) 07/29/2011 10:30 AM  Drainage Amount Moderate 07/29/2011 10:30 AM  Drainage Description Serosanguineous 07/29/2011 10:30 AM  Treatment Cleansed;Debridement (Selective);Hydrotherapy (Pulse lavage) 07/29/2011 10:30 AM  Dressing Type Silicone dressing;Moist to dry 07/29/2011 10:30 AM  Dressing Changed 07/29/2011 10:30 AM     Pressure Ulcer 07/26/11 Stage II -  Partial thickness loss of dermis presenting as a shallow open ulcer with a red, pink wound bed without slough. measuring 2 x2 (Active)  Dressing Type Silicone dressing 07/29/2011  7:26 AM  Dressing Changed;Clean;Dry 07/29/2011  7:26 AM     Pressure Ulcer 07/26/11 Stage II -  Partial thickness loss of dermis presenting as a shallow open ulcer with a red, pink wound bed without slough. measuring 1 x 1 (Active)  Dressing Type Silicone dressing 07/27/2011  9:54 AM  Dressing Clean;Dry;Intact 07/29/2011  7:26 AM     Pressure Ulcer 07/26/11 Stage II -  Partial thickness loss of dermis presenting as a shallow open ulcer with a red, pink wound bed without slough. healing (Active)  Dressing Type Silicone dressing 07/27/2011  9:54 AM  Dressing Clean;Dry;Intact 07/29/2011  7:26 AM   Hydrotherapy Pulsed lavage  therapy - wound location: right back, two area with thick black eschar on right hip Pulsed Lavage with Suction (psi): 12 psi Pulsed Lavage with Suction - Normal Saline Used: 2000 mL Pulsed Lavage Tip: Tip with splash shield Selective Debridement Selective Debridement - Location: smaller area on right hip with loose eschar Selective Debridement - Tools Used: Forceps;Scissors Selective Debridement - Tissue Removed: loose black eschar   Wound Assessment and Plan  Continue with Pulsed Lavage with suction and selective debridement as indicated to loose eschar.  Dressing change with  Santyl to eschar and silicone dressing.    Wound Therapy Goals- Improve the function of patient's integumentary system by progressing the wound(s) through the phases of wound healing (inflammation - proliferation - remodeling) by:    Goals will be updated until maximal potential achieved or discharge criteria met.  Discharge criteria: when goals achieved, discharge from hospital, MD decision/surgical intervention, no progress towards goals, refusal/missing three consecutive treatments without notification or medical reason.  Donnetta Hail 07/29/2011, 1:56 PM

## 2011-07-29 NOTE — Progress Notes (Signed)
Procedure:  Debridement of right hip wound.  Technique:  The right hip wound was sterilely prepped.  Xylocaine was used as needed for local anesthesia.  The black eschar was sharply debrided down to bleeding tissue involving a 4 square centimeter area.  Direct pressure was held until hemostasis was obtained and a dressing was applied.  He tolerated the procedure well.

## 2011-07-30 DIAGNOSIS — D649 Anemia, unspecified: Secondary | ICD-10-CM | POA: Diagnosis present

## 2011-07-30 DIAGNOSIS — K5641 Fecal impaction: Secondary | ICD-10-CM | POA: Diagnosis present

## 2011-07-30 DIAGNOSIS — G35 Multiple sclerosis: Secondary | ICD-10-CM

## 2011-07-30 DIAGNOSIS — T07XXXA Unspecified multiple injuries, initial encounter: Secondary | ICD-10-CM | POA: Diagnosis present

## 2011-07-30 DIAGNOSIS — W19XXXA Unspecified fall, initial encounter: Secondary | ICD-10-CM | POA: Diagnosis present

## 2011-07-30 LAB — CBC
Platelets: 529 10*3/uL — ABNORMAL HIGH (ref 150–400)
RBC: 4.18 MIL/uL — ABNORMAL LOW (ref 4.22–5.81)
WBC: 12.4 10*3/uL — ABNORMAL HIGH (ref 4.0–10.5)

## 2011-07-30 MED ORDER — GUAIFENESIN-DM 100-10 MG/5ML PO SYRP: 5.0000 mL | ORAL_SOLUTION | ORAL | Status: DC | PRN

## 2011-07-30 NOTE — Progress Notes (Signed)
Occupational Therapy Treatment Patient Details Name: [name] Mccoy MRN: [id number] DOB: 1960-05-29 Today's Date: 07/30/2011  OT Assessment/Plan OT Assessment/Plan Comments on Treatment Session: Excellent participation with therapy. Motivated to get stronger. Focus of session of trunk control in sitting EOB.Able to sit 20 min with intermittent support. Used rhythmic stabilization techniques EOB for strengthening.Pt unable to shift weight through pelvis- required Max A for wt shift. Rec managing spasticity of BLE in conjunction with L knee positioing deivice to decrease further knee flexion contracture. OT Plan: Discharge plan remains appropriate OT Frequency: Min 1X/week Follow Up Recommendations: Skilled nursing facility Equipment Recommended: Defer to next venue OT Goals Acute Rehab OT Goals OT Goal Formulation: With patient Time For Goal Achievement: 2 weeks Miscellaneous OT Goals Miscellaneous OT Goal #1: Pt will sit unsupported EOB with max A for supine to sit and min guard for 5 minutes in prep for seated adls OT Goal: Miscellaneous Goal #1 - Progress: Met Miscellaneous OT Goal #2: pt will roll to bil sides with mod A and rails for adls OT Goal: Miscellaneous Goal #2 - Progress: Progressing toward goals Miscellaneous OT Goal #3: new goal: Establish positioning program for BLE to decrease further knee flexion contractures and promote functional positioning for ADL and mobility. 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     Mobility  Bed Mobility Bed Mobility: Yes Rolling Left: 1: +1 Total assist;Patient  percentage (comment) (50%) Left Sidelying to Sit: 1: +2 Total assist;HOB elevated (comment degrees) Left Sidelying to Sit Details (indicate cue type and reason): pt primarily using BUE to push up to sit Transfers Transfers: No Exercises General Exercises - Upper Extremity Shoulder Flexion: AROM;Both;10 reps;Strengthening Shoulder ABduction: AROM;Strengthening;Both;10 reps Shoulder Horizontal ABduction: AROM;Strengthening;Both;10 reps Elbow Flexion: AROM;Both;10 reps;Strengthening Chair Push Up: Strengthening;Both (modified push ups form bed)  End of Session OT - End of Session Activity Tolerance: Patient limited by pain;Patient limited by fatigue (limited due to apparent contractures) Patient left: in bed;with call bell in reach;Other (comment) (working with Theatre stage manager) Nurse Communication: Other (comment) (positioning) General Behavior During Session: [facility] for tasks performed Cognition: [facility] for tasks performed  [facility]  07/30/2011, 12:34 PM [facility], OTR/L  [phone] 07/30/2011

## 2011-07-30 NOTE — Progress Notes (Signed)
Patient  alert and oriented x4. Pain level  6/10; Vicodin given at 10:15 Reassessment for pain x1hr later- 6/10.Patient states "pain usually stays the same".  Patient was given a bath and sheets were changed. Attempted to straighten bilateral lower extremities as much as possible as per  requested by the physical therapist. Patient made comfortable resting in bed without additional complaints.[name] Mccoy [name] .

## 2011-07-30 NOTE — Progress Notes (Signed)
PROGRESS NOTE  [name] Mccoy JXB:[id number] DOB: 07-22-1960 DOA: 07/25/2011 PCP: No primary provider on file.  Brief narrative: 51 year old man with a PMH of multiple sclerosis who presented to the ER with a history of a fall approximately 5 days prior to admission, which resulted in prolonged immobility leading to dehydration and rhabdomyolysis.  He was also noted to be febrile and have cellulitis.  He initially improved but then developed recurrent fever. There is concern for the possibility of abscess of the right hip. CT scan however was unremarkable. General surgery seen the patient in consultation and debridement is being contemplated. Patient continues on broad-spectrum antibiotic therapy. Ultimately the patient will be going to skilled nursing facility for rehabilitation.  Assessment/Plan: Principal Problem:  *Rhabdomyolysis secondary to fall The patient was admitted and put on IV fluids for rehydration. His CK levels have gradually come down. His renal function is stable. Active Problems:  Multiple sclerosis Patient has limited mobility due to his history of multiple sclerosis. He will need skilled nursing home placement.  Dehydration Patient has been hydrated and this has now resolved.  Fecal impaction Patient has been put on daily MiraLAX. His bowels moved yesterday.  Normocytic anemia Mild and likely secondary to anemia of acute illness.  Wounds, multiple The patient has been seen by the wound care RN. He has multiple pressure ulcers. His right hip has unstageable wounds. His right back is unstageable wounds. He also has wounds to the left inner knee, right outer knee, right knee, and left hip. Wound care recommendations were noted. Patient will continue on Santyl for chemical debridement of nonviable tissue and foam dressings to help with pressure reduction. The patient has been seen by general surgery for evaluation of the need for debridement of his wounds. Dr. Abbey Chatters plans to  debride his right hip wound at the bedside. He recommended hydrotherapy for the other wounds.  Hyperglycemia Patient's fasting blood glucoses have been elevated. He does not have a known history of diabetes. We'll check a hemoglobin A1c.  Code Status: Full Family Communication: JAYVION, STEFANSKI [phone] Disposition Plan: SNF placement  Medical Consultants:  Dr. Faith Rogue, Physical Medicine and Rehabilitation: Skilled nursing home placement recommended.  Dr. Avel Peace, Surgery: Plans to debride the right hip wound at the bedside; Hydrotherapy recommended for other wounds.  Other Consultants:  Physical therapy: Inpatient rehabilitation, skilled or LTACH   Occupational therapy: Skilled nursing facility   Wound care   Procedures:  None  Antibiotics:  Clindamycin 07/26/11 ---> 07/26/11  Levaquin 07/26/11 ---> 07/28/11  Zosyn 07/28/11 --->  Vancomycin 07/28/11 --->   Subjective  Mr. Tiso feels chilled. He states his bowels moved yesterday. No nausea, vomiting, or diarrhea.   Objective    Interim History: Mr. Dunstan was stable overnight.   Objective: Filed Vitals:   07/29/11 2330 07/30/11 0650 07/30/11 0930 07/30/11 1349  BP:  111/78 109/64 114/71  Pulse:  98 118 108  Temp: 99.2 F (37.3 C) 98.7 F (37.1 C) 98.4 F (36.9 C) 99.2 F (37.3 C)  TempSrc:  Oral Oral Oral  Resp:  18 18 18   Height:      Weight:      SpO2:  100% 99% 98%    Intake/Output Summary (Last 24 hours) at 07/30/11 1426 Last data filed at 07/30/11 1327  Gross per 24 hour  Intake   1520 ml  Output   4993 ml  Net  -3473 ml    Exam: Gen:  NAD Cardiovascular:  RRR, No M/R/G Respiratory:  Lungs CTAB Gastrointestinal: Abdomen soft, NT/ND with normal active bowel sounds. Extremities: No C/E/C   Data Reviewed: Basic Metabolic Panel:  Lab 07/29/11 1610 07/27/11 0422 07/26/11 0538 07/25/11 2040  NA 136 134* 135 134*  K 3.6 3.6 -- --  CL 104 105 103 93*  CO2 26 25 25 30     GLUCOSE 135* 107* 188* 101*  BUN 6 16 17 19   CREATININE 0.52 0.54 0.57 0.63  CALCIUM 8.0* 7.6* 8.2* 9.6  MG -- -- -- --  PHOS -- -- -- --   Liver Function Tests:  Lab 07/25/11 2040  AST 40*  ALT 28  ALKPHOS 78  BILITOT 0.7  PROT 7.7  ALBUMIN 2.8*   CBC:  Lab 07/30/11 0355 07/29/11 0400 07/26/11 0538 07/25/11 2040  WBC 12.4* 9.6 12.1* 12.5*  NEUTROABS -- -- -- 9.9*  HGB 11.1* 10.8* 11.8* 13.5  HCT 34.8* 33.3* 35.9* 42.5  MCV 83.3 82.8 81.4 82.5  PLT 529* 375 406* 474*   Cardiac Enzymes:  Lab 07/27/11 0422 07/25/11 2040  CKTOTAL 587* 1526*  CKMB 3.2 --  CKMBINDEX -- --  TROPONINI -- --     Recent Results (from the past 240 hour(s))  MRSA PCR SCREENING     Status: Normal   Collection Time   07/26/11  5:21 AM      Component Value Range Status Comment   MRSA by PCR NEGATIVE  NEGATIVE  Final   CULTURE, BLOOD (ROUTINE X 2)     Status: Normal (Preliminary result)   Collection Time   07/28/11  4:55 PM      Component Value Range Status Comment   Specimen Description BLOOD LEFT HAND   Final    Special Requests BOTTLES DRAWN AEROBIC ONLY 3CC   Final    Culture  Setup Time [id number]   Final    Culture     Final    Value:        BLOOD CULTURE RECEIVED NO GROWTH TO DATE CULTURE WILL BE HELD FOR 5 DAYS BEFORE ISSUING A FINAL NEGATIVE REPORT   Report Status PENDING   Incomplete   CULTURE, BLOOD (ROUTINE X 2)     Status: Normal (Preliminary result)   Collection Time   07/28/11  5:00 PM      Component Value Range Status Comment   Specimen Description BLOOD LEFT HAND   Final    Special Requests BOTTLES DRAWN AEROBIC ONLY 3CC   Final    Culture  Setup Time [id number]   Final    Culture     Final    Value:        BLOOD CULTURE RECEIVED NO GROWTH TO DATE CULTURE WILL BE HELD FOR 5 DAYS BEFORE ISSUING A FINAL NEGATIVE REPORT   Report Status PENDING   Incomplete   URINE CULTURE     Status: Normal   Collection Time   07/28/11  6:43 PM      Component Value Range Status  Comment   Specimen Description URINE, CATHETERIZED   Final    Special Requests NONE   Final    Culture  Setup Time [id number]   Final    Colony Count NO GROWTH   Final    Culture NO GROWTH   Final    Report Status 07/29/2011 FINAL   Final      Studies:  Ct Pelvis W Contrast 07/29/2011   IMPRESSION: Cholelithiasis is suspected.  Fecal impaction in the rectum is noted.  The rectal wall is  edematous and thickened.  No evidence of subcutaneous abscess.  Stranding in the subcutaneous fat of the buttock is noted which may represent cellulitis.  No convincing evidence of a destructive bony process to suggest osteomyelitis.  Scrotal hydrocele is suspected.  Original Report Authenticated By: Donavan Burnet, M.D.    Scheduled Meds:    . amantadine  100 mg Oral BID  . calcium carbonate  1 tablet Oral Daily  . collagenase   Topical Daily  . dantrolene  25 mg Oral TID  . enoxaparin  40 mg Subcutaneous Q24H  . gabapentin  600 mg Oral QID  . interferon beta-1b  0.25 mg Subcutaneous QODAY  . modafinil  200 mg Oral Daily  . piperacillin-tazobactam (ZOSYN)  IV  3.375 g Intravenous Q8H  . polyethylene glycol  17 g Oral Daily  . vancomycin  1,000 mg Intravenous Q8H   Continuous Infusions:    . sodium chloride 75 mL/hr at 07/29/11 1833      LOS: 5 days   Hillery Aldo, MD Pager [phone]  07/30/2011, 2:26 PM

## 2011-07-30 NOTE — Consult Note (Signed)
Physical Medicine and Rehabilitation Consult Reason for Consult: Deconditioning Referring Phsyician: [name] Mccoy is an 51 y.o. male.   HPI: 51 year old right-handed male with history of multiple sclerosis able to ambulate with a cane until recently when he became progressively weaker as well as fall and was unable to get up off the ground for approximately 5 days and admitted March 9. Noted total CK of 3467 and rhabdomyolysis. Noted patient was superficial decubitus on the hip and back which developed after being down for prolonged time. Patient was placed on intravenous fluids and renal function was within normal limits. The wound care nurse was consulted for multiple pressure ulcers. Noted at time of consultation with wound care nurse right hip wound unstageable 4 x 3 cm 6 x 6 cm right back and 14 x 4 cm and 1 x 3 cm as well his wounds to left inner knee right outer knee and left hip. General surgery was consulted and underwent debridement of right hip wound March 12. Patient has been receiving hydrotherapy for wound. His latest CK level of 587. Maintained on subcutaneous Lovenox for deep vein thrombosis prophylaxis. Betaseron ongoing as well as Dantrium for history of multiple sclerosis. He remains on intravenous vancomycin for wound coverage. Physical therapy evaluation completed March 12 with recommendations for physical medicine rehabilitation consult Review of Systems  Genitourinary: Positive for urgency and frequency.  Musculoskeletal: Positive for joint pain.  Neurological:       Spasticity  All other systems reviewed and are negative.   Past Medical History  Diagnosis Date  . Multiple sclerosis    History reviewed. No pertinent past surgical history. History reviewed. No pertinent family history. Social History:  reports that he has never smoked. He does not have any smokeless tobacco history on file. He reports that he does not drink alcohol or use illicit drugs. Allergies:  No Known Allergies Medications Prior to Admission  Medication Dose Route Frequency Provider Last Rate Last Dose  . 0.9 %  sodium chloride infusion   Intravenous Continuous Standley Brooking, MD 75 mL/hr at 07/29/11 1833    . acetaminophen (TYLENOL) tablet 650 mg  650 mg Oral Once Forbes Cellar, MD   650 mg at 07/26/11 0036  . acetaminophen (TYLENOL) tablet 650 mg  650 mg Oral Q6H PRN Standley Brooking, MD   650 mg at 07/29/11 2309  . amantadine (SYMMETREL) capsule 100 mg  100 mg Oral BID Standley Brooking, MD   100 mg at 07/29/11 2136  . calcium carbonate (OS-CAL - dosed in mg of elemental calcium) tablet 500 mg of elemental calcium  1 tablet Oral Daily Houston Siren, MD   500 mg of elemental calcium at 07/29/11 0949  . clindamycin (CLEOCIN) IVPB 900 mg  900 mg Intravenous Once Forbes Cellar, MD   900 mg at 07/26/11 0035  . collagenase (SANTYL) ointment   Topical Daily Standley Brooking, MD      . dantrolene (DANTRIUM) capsule 25 mg  25 mg Oral TID Houston Siren, MD   25 mg at 07/29/11 2136  . enoxaparin (LOVENOX) injection 40 mg  40 mg Subcutaneous Q24H Houston Siren, MD   40 mg at 07/29/11 0809  . gabapentin (NEURONTIN) capsule 600 mg  600 mg Oral QID Houston Siren, MD   600 mg at 07/29/11 2136  . HYDROcodone-acetaminophen (NORCO) 5-325 MG per tablet 1 tablet  1 tablet Oral Q4H PRN Standley Brooking, MD   1 tablet at 07/29/11 1115  .  influenza  inactive virus vaccine (FLUZONE/FLUARIX) injection 0.5 mL  0.5 mL Intramuscular Once Cristal Ford, MD   0.5 mL at 07/26/11 1119  . interferon beta-1b (BETASERON) injection 0.25 mg  0.25 mg Subcutaneous QODAY Houston Siren, MD   0.25 mg at 07/28/11 1023  . iohexol (OMNIPAQUE) 300 MG/ML solution 100 mL  100 mL Intravenous Once PRN Medication Radiologist, MD   75 mL at 07/28/11 2156  . methylPREDNISolone sodium succinate (SOLU-MEDROL) 125 MG injection 125 mg  125 mg Intravenous Once Forbes Cellar, MD   125 mg at 07/26/11 0032  . modafinil (PROVIGIL) tablet 200 mg  200 mg Oral  Daily Houston Siren, MD   200 mg at 07/29/11 1115  . piperacillin-tazobactam (ZOSYN) IVPB 3.375 g  3.375 g Intravenous Q8H Gwen Her, PHARMD   3.375 g at 07/30/11 0208  . polyethylene glycol (MIRALAX / GLYCOLAX) packet 17 g  17 g Oral Daily Standley Brooking, MD      . sodium chloride 0.9 % bolus 1,000 mL  1,000 mL Intravenous Once Forbes Cellar, MD   1,000 mL at 07/25/11 2040  . sodium chloride 0.9 % bolus 1,000 mL  1,000 mL Intravenous Once Forbes Cellar, MD   1,000 mL at 07/25/11 2226  . sodium chloride 0.9 % bolus 1,000 mL  1,000 mL Intravenous Once Forbes Cellar, MD   1,000 mL at 07/25/11 2342  . vancomycin (VANCOCIN) IVPB 1000 mg/200 mL premix  1,000 mg Intravenous Q8H Gwen Her, PHARMD   1,000 mg at 07/30/11 0208  . DISCONTD: 0.9 %  sodium chloride infusion   Intravenous Continuous Standley Brooking, MD 150 mL/hr at 07/27/11 1649    . DISCONTD: amantadine (SYMMETREL) capsule 200 mg  200 mg Oral Daily Houston Siren, MD   100 mg at 07/27/11 0930  . DISCONTD: dextrose 5 % and 0.45% NaCl 1,000 mL with sodium bicarbonate 50 mEq infusion   Intravenous Continuous Forbes Cellar, MD      . DISCONTD: dextrose 5 % and 0.9% NaCl 1,000 mL with sodium bicarbonate 50 mEq infusion   Intravenous Continuous Houston Siren, MD      . DISCONTD: dextrose 5 %-0.9 % sodium chloride infusion   Intravenous Continuous Houston Siren, MD 100 mL/hr at 07/26/11 1342    . DISCONTD: Levofloxacin (LEVAQUIN) IVPB 750 mg  750 mg Intravenous Q24H Standley Brooking, MD   750 mg at 07/27/11 1722   Medications Prior to Admission  Medication Sig Dispense Refill  . amantadine (SYMMETREL) 100 MG capsule Take 200 mg by mouth daily.      . calcium carbonate (OS-CAL - DOSED IN MG OF ELEMENTAL CALCIUM) 1250 MG tablet Take 1 tablet by mouth daily.      . dantrolene (DANTRIUM) 25 MG capsule Take 25 mg by mouth 3 (three) times daily.      Marland Kitchen gabapentin (NEURONTIN) 600 MG tablet Take 600 mg by mouth 4 (four) times daily.      . interferon  beta-1b (BETASERON) 0.3 MG injection Inject 0.25 mg into the skin every other day. Next dose 07-11-2011      . modafinil (PROVIGIL) 200 MG tablet Take 200 mg by mouth daily.        Home: Home Living Lives With: Alone (mother with demetia usually at ALF currently with him) Receives Help From: Family Type of Home: House Home Layout: Other (Comment) (pt reports house is fixed up so that he can be independent) Home Adaptive Equipment: Wheelchair - manual;Straight cane (sliding board)  Additional Comments: was on the floor x 5 days:  sore bil hips due to position; L knee dislocates per pt.  Has had MS x 33 years  Functional History: Prior Function Level of Independence: Independent with transfers Comments:  (walked short distances with cane; mostly used walker) Functional Status:  Mobility: Bed Mobility Bed Mobility: Yes Rolling Right: 1: +2 Total assist;Patient percentage (comment) (pt ~50%) Rolling Right Details (indicate cue type and reason): pt limited by pain and difficulty moving on air mattress.  He holds onto both knees in flexed postition and tries to roll over as a unit Rolling Left: 1: +2 Total assist;Other (comment) (pt ~ 50%. ) Rolling Left Details (indicate cue type and reason): pt with pain, holds both knees up in flexed position and tries to roll over on air mattress with difficulty Transfers Transfers: No Ambulation/Gait Ambulation/Gait: No Stairs: No Wheelchair Mobility Wheelchair Mobility: No  ADL: ADL Eating/Feeding: Simulated;Independent Where Assessed - Eating/Feeding: Bed level Grooming: Simulated;Set up Where Assessed - Grooming: Supine, head of bed up Upper Body Bathing: Simulated;Set up Where Assessed - Upper Body Bathing: Supine, head of bed up Lower Body Bathing: Simulated;Other (comment);+2 Total assistance;Comment for patient % (50% bathing:  from positon of comfort: knees flexed) Where Assessed - Lower Body Bathing: Rolling right and/or left Upper  Body Dressing: Simulated;Set up Where Assessed - Upper Body Dressing: Supine, head of bed up Lower Body Dressing: Simulated;+2 Total assistance;Comment for patient % (0) Where Assessed - Lower Body Dressing: Rolling right and/or left Toilet Transfer: Not assessed Toilet Transfer Method:  (bed pan +2, pt 0%) Toileting - Clothing Manipulation: Simulated;+1 Total assistance Where Assessed - Toileting Clothing Manipulation: Rolling right and/or left Toileting - Hygiene: Simulated;Moderate assistance Where Assessed - Toileting Hygiene: Rolling right and/or left Ambulation Related to ADLs: unable.  Pt can usually roll over:  total A to roll L today; felt he could do more if he could work on stretching himself out ADL Comments: attempted to see pt again with PT but running fever; RN felt better to wait.  Assisted NT with placing bedpan under pt  Cognition: Cognition Arousal/Alertness: Awake/alert Orientation Level: Oriented X4 Cognition Arousal/Alertness: Awake/alert Overall Cognitive Status: Appears within functional limits for tasks assessed Orientation Level: Oriented X4 Cognition - Other Comments: pt very pleasant, c/o pain in both hips that is keeping him from stretching right leg out and he does not want to even try to stretch out left leg  Blood pressure 111/69, pulse 132, temperature 99.2 F (37.3 C), temperature source Oral, resp. rate 18, height 6\' 3"  (1.905 m), weight 64.3 kg (141 lb 12.1 oz), SpO2 97.00%. Physical Exam  Nursing note and vitals reviewed. Constitutional: He is oriented to person, place, and time. He appears well-developed.       Patient is laying on his side hunched into a ball with hips and knees flexed.  HENT:  Head: Normocephalic.  Neck: Neck supple. No thyromegaly present.  Cardiovascular: Normal rate.   Pulmonary/Chest: Breath sounds normal. He has no wheezes.  Abdominal: He exhibits no distension. There is no tenderness.  Musculoskeletal: He exhibits no  edema.       Both hips are tender to touch and range of motion. Patient jumped when I tried to extend his right knee in fact. Knees are not frankly tender to touch.  Neurological: He is alert and oriented to person, place, and time. A cranial nerve deficit is present.  Reflex Scores:      Tricep reflexes are 3+  on the right side and 3+ on the left side.      Bicep reflexes are 3+ on the right side and 3+ on the left side.      Brachioradialis reflexes are 3+ on the right side and 3+ on the left side.      Patellar reflexes are 3+ on the right side and 3+ on the left side.      Achilles reflexes are 3+ on the right side and 3+ on the left side.      Patient with increased tone lower extremities ( two out of four). Motor function 1-2/5 right lower extremity and trace out of 5 left lower extremity. Sensation grossly intact to pain he denies any loss of light touch. Upper extremity strength grossly 4/5. Patient with poor insight and awareness.  Skin:       Multiple skin ulcers dressed and healing without odor  Psychiatric: He has a normal mood and affect.    Results for orders placed during the hospital encounter of 07/25/11 (from the past 24 hour(s))  VANCOMYCIN, TROUGH     Status: Normal   Collection Time   07/29/11  5:30 PM      Component Value Range   Vancomycin Tr 13.2  10.0 - 20.0 (ug/mL)  CBC     Status: Abnormal   Collection Time   07/30/11  3:55 AM      Component Value Range   WBC 12.4 (*) 4.0 - 10.5 (K/uL)   RBC 4.18 (*) 4.22 - 5.81 (MIL/uL)   Hemoglobin 11.1 (*) 13.0 - 17.0 (g/dL)   HCT 29.5 (*) 62.1 - 52.0 (%)   MCV 83.3  78.0 - 100.0 (fL)   MCH 26.6  26.0 - 34.0 (pg)   MCHC 31.9  30.0 - 36.0 (g/dL)   RDW 30.8  65.7 - 84.6 (%)   Platelets 529 (*) 150 - 400 (K/uL)   Ct Pelvis W Contrast  07/29/2011  *RADIOLOGY REPORT*  Clinical Data:  Pressures for over the right hip  CT PELVIS WITHOUT CONTRAST  Technique:  Multidetector CT imaging of the pelvis was performed following the  standard protocol without intravenous contrast.  Comparison:   None.  Findings:  Gallstones are suspected.  The visualized liver is within normal limits.  Extensive stool throughout colon.  There are tiny calcific densities within the subcutaneous fat of the ischial region.  Air bubbles are seen within the subcutaneous fat adjacent to the calcifications of unknown significance.  There is some thickening of the gluteus maximus muscle.  There is no abscess.  Stranding in the subcutaneous fat of the right buttock is noted.  Large amount of stool in the rectum. There is wall thickening of the rectum and edema.  Foley catheter decompresses the bladder.  The scrotal sac is prominent and may contain hydrocele.  No destructive bone lesion.  No acute fracture or dislocation. Degenerative changes in the spine are noted.  IMPRESSION: Cholelithiasis is suspected.  Fecal impaction in the rectum is noted.  The rectal wall is edematous and thickened.  No evidence of subcutaneous abscess.  Stranding in the subcutaneous fat of the buttock is noted which may represent cellulitis.  No convincing evidence of a destructive bony process to suggest osteomyelitis.  Scrotal hydrocele is suspected.  Original Report Authenticated By: Donavan Burnet, M.D.    Assessment/Plan: Diagnosis: Multiple sclerosis/rhabdomyolysis 1. Does the need for close, 24 hr/day medical supervision in concert with the patient's rehab needs make it unreasonable for this  patient to be served in a less intensive setting? Potentially 2. Co-Morbidities requiring supervision/potential complications: See above 3. Due to bladder management, bowel management, safety, skin/wound care, disease management, medication administration, pain management and patient education, does the patient require 24 hr/day rehab nursing? Potentially 4. Does the patient require coordinated care of a physician, rehab nurse, PT and OT to address physical and functional deficits in the  context of the above medical diagnosis(es)? Potentially Addressing deficits in the following areas: balance, endurance, locomotion, strength, transferring, bowel/bladder control, bathing, dressing, feeding, grooming, toileting and cognition 5. Can the patient actively participate in an intensive therapy program of at least 3 hrs of therapy per day at least 5 days per week? No 6. The potential for patient to make measurable gains while on inpatient rehab is fair and poor 7. Anticipated functional outcomes upon discharge from inpatients are minimal to moderate assist PT, to moderate assist with OT 8. Estimated rehab length of stay to reach the above functional goals is: Not applicable 9. Does the patient have adequate social supports to accommodate these discharge functional goals? No 10. Anticipated D/C setting: Other 11. Anticipated post D/C treatments: HH therapy 12. Overall Rehab/Functional Prognosis: fair  RECOMMENDATIONS: This patient's condition is appropriate for continued rehabilitative care in the following setting: SNF Patient has agreed to participate in recommended program. Yes Note that insurance prior authorization may be required for reimbursement for recommended care.  Comment: Patient likely will require extended stay to return to arrival at which she can go home. Given his  History, it's likely that he was unsafe prior to this admission and long-term living options may need to be reviewed sometime soon. Patient lacks insight into his deficits as well.   [name] Mccoy M.D. 07/30/2011

## 2011-07-30 NOTE — Progress Notes (Signed)
Physical Therapy Treatment Patient Details Name: [name] Mccoy MRN: [id number] DOB: [birth year] Today's Date: 07/30/2011  PT Assessment/Plan  PT - Assessment/Plan Comments on Treatment Session: pt continues with pain in hips but better able to tolerate ROM today PT Plan: Discharge plan needs to be updated PT Frequency: Min 3X/week Follow Up Recommendations: Skilled nursing facility Equipment Recommended: Defer to next venue PT Goals  Acute Rehab PT Goals PT Goal: Rolling Supine to Right Side - Progress: Progressing toward goal PT Goal: Supine/Side to Sit - Progress: Progressing toward goal  PT Treatment Precautions/Restrictions  Precautions Precautions: Fall Precaution Comments: pt with MS Required Braces or Orthoses: No Restrictions Weight Bearing Restrictions:  (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 Mobility (including Balance) Bed Mobility Bed Mobility: Yes Rolling Right: 3: Mod assist Rolling Left: 1: +1 Total assist;Patient percentage (comment) (50%) Left Sidelying to Sit: 1: +2 Total assist;HOB elevated (comment degrees) Left Sidelying to Sit Details (indicate cue type and reason): pt primarily using BUE to push up to sit Transfers Transfers: No Ambulation/Gait Ambulation/Gait: No Stairs: No Wheelchair Mobility Wheelchair Mobility: No  Balance Balance Assessed: No Exercise  General Exercises - Upper Extremity Shoulder Flexion: AROM;Both;10 reps;Strengthening Shoulder ABduction: AROM;Strengthening;Both;10 reps Shoulder Horizontal ABduction: AROM;Strengthening;Both;10 reps Elbow Flexion: AROM;Both;10 reps;Strengthening Chair Push Up: Strengthening;Both (modified push ups form bed) Other Exercises Other Exercises: Gentle ROM and stretching of right leg.  Pt with increased tone into adduction, internal rotation and flexion,  but pt able to extend to minus 30 degrees to full extension and maintian it with right leg.  Still not able to extend left leg much End of Session PT - End of Session Activity Tolerance: Patient limited by pain Patient left: in bed;with call bell in reach General Behavior During Session: [facility] for tasks performed Cognition: [facility] for tasks performed  Donnetta Hail 07/30/2011, 12:46 PM

## 2011-07-30 NOTE — Progress Notes (Signed)
Dressing to R knee and R inner  done at this time, R knee pressure ulcer measures 4x2cm and R inner part 3x.5 cm, santyl ointment applied and allevyn dsg placed

## 2011-07-30 NOTE — Progress Notes (Signed)
Physical Therapy Wound Treatment Patient Details  Name: [name] Mccoy MRN: [id number] Date of Birth: 10-Apr-1961  Today's Date: 07/30/2011 Time: 4098-1191 Time Calculation (min): 45 min  Subjective     Pain Score: Pain Score: Asleep  Wound Assessment  Pressure Ulcer 07/26/11 Stage III -  Full thickness tissue loss. Subcutaneous fat may be visible but bone, tendon or muscle are NOT exposed. measuring 7.5 width by 9.5 (Active)  State of Healing Eschar 07/30/2011 10:10 AM  Site / Wound Assessment Black;Pale 07/30/2011 10:10 AM  % Wound base Red or Granulating 5: 07/30/2011 10:10 AM  % Wound base Yellow 40 07/30/2011 10:10 AM  % Wound base Black 45 07/30/2011 10:10 AM  Peri-wound Assessment Intact 07/30/2011 10:10 AM  Tunneling (cm) no 07/29/2011 10:30 AM  Undermining (cm) no 07/29/2011 10:30 AM  Margins Unattacted edges (unapproximated) 07/30/2011 10:10 AM  Drainage Amount Moderate 07/30/2011 10:10 AM  Drainage Description Serosanguineous 07/30/2011 10:10 AM  Treatment Cleansed;Hydrotherapy (Pulse lavage);Other (Comment) 07/29/2011 10:30 AM  Dressing Type Moist to dry;Silicone dressing 07/30/2011 10:10 AM  Dressing Changed 07/30/2011 10:10 AM     Pressure Ulcer 07/26/11 Stage III -  Full thickness tissue loss. Subcutaneous fat may be visible but bone, tendon or muscle are NOT exposed. measuring 7.0 x 4 (Active)  State of Healing Eschar 07/30/2011 10:10 AM  Site / Wound Assessment Black;Granulation tissue;Yellow 07/30/2011 10:10 AM  % Wound base Red or Granulating 10% 07/30/2011 10:10 AM  % Wound base Yellow 40% 07/30/2011 10:10 AM  % Wound base Black 50% 07/30/2011 10:10 AM  Peri-wound Assessment Intact 07/30/2011 10:10 AM  Tunneling (cm) no 07/29/2011 10:30 AM  Undermining (cm) no 07/29/2011 10:30 AM  Margins Unattacted edges (unapproximated) 07/30/2011 10:10 AM  Drainage Amount Moderate 07/30/2011 10:10 AM  Drainage Description Serosanguineous 07/30/2011 10:10 AM  Treatment Cleansed;Debridement  (Selective);Hydrotherapy (Pulse lavage) 07/29/2011 10:30 AM  Dressing Type Silicone dressing;Foam;Moist to dry 07/30/2011 10:10 AM  Dressing Changed 07/30/2011 10:10 AM     Pressure Ulcer 07/26/11 Stage II -  Partial thickness loss of dermis presenting as a shallow open ulcer with a red, pink wound bed without slough. measuring 23 x 6 (Active)  State of Healing Early/partial granulation 07/30/2011 10:10 AM  Site / Wound Assessment Black;Red;Yellow;Granulation tissue 07/30/2011 10:10 AM  % Wound base Red or Granulating 40% 07/30/2011 10:10 AM  % Wound base Yellow 10% 07/30/2011 10:10 AM  % Wound base Black 50% 07/30/2011 10:10 AM  Peri-wound Assessment Denuded 07/30/2011 10:10 AM  Tunneling (cm) no 07/29/2011 10:30 AM  Undermining (cm) no 07/29/2011 10:30 AM  Margins Unattacted edges (unapproximated) 07/30/2011 10:10 AM  Drainage Amount Moderate 07/30/2011 10:10 AM  Drainage Description Serosanguineous 07/30/2011 10:10 AM  Treatment Cleansed;Debridement (Selective);Hydrotherapy (Pulse lavage) 07/29/2011 10:30 AM  Dressing Type Silicone dressing;Moist to dry 07/30/2011 10:10 AM  Dressing Changed 07/30/2011 10:10 AM     Pressure Ulcer 07/26/11 Stage II -  Partial thickness loss of dermis presenting as a shallow open ulcer with a red, pink wound bed without slough. measuring 2 x2 (Active)  Dressing Type Silicone dressing 07/29/2011  7:26 AM  Dressing Dry;Clean;Intact 07/30/2011  7:37 AM     Pressure Ulcer 07/26/11 Stage II -  Partial thickness loss of dermis presenting as a shallow open ulcer with a red, pink wound bed without slough. measuring 1 x 1 (Active)  Dressing Type Silicone dressing 07/27/2011  9:54 AM  Dressing Clean;Dry;Intact 07/30/2011  7:37 AM     Pressure Ulcer 07/26/11 Stage II -  Partial  thickness loss of dermis presenting as a shallow open ulcer with a red, pink wound bed without slough. healing (Active)  Dressing Type Silicone dressing 07/27/2011  9:54 AM  Dressing Clean;Dry;Intact 07/30/2011   7:37 AM   Hydrotherapy Pulsed lavage therapy - wound location: right back, two area with thick black eschar on right hip Pulsed Lavage with Suction (psi): 12 psi Pulsed Lavage with Suction - Normal Saline Used: 2000 mL Pulsed Lavage Tip: Tip with splash shield   Wound Assessment and Plan pt had I and D of right hip last evening, today eschar is less with yellow fat visible    Wound Therapy Goals- Improve the function of patient's integumentary system by progressing the wound(s) through the phases of wound healing (inflammation - proliferation - remodeling) by:    Goals will be updated until maximal potential achieved or discharge criteria met.  Discharge criteria: when goals achieved, discharge from hospital, MD decision/surgical intervention, no progress towards goals, refusal/missing three consecutive treatments without notification or medical reason.  Donnetta Hail 07/30/2011, 12:37 PM

## 2011-07-31 LAB — CBC
Hemoglobin: 10.5 g/dL — ABNORMAL LOW (ref 13.0–17.0)
MCH: 26.3 pg (ref 26.0–34.0)
MCHC: 31.9 g/dL (ref 30.0–36.0)
Platelets: 572 10*3/uL — ABNORMAL HIGH (ref 150–400)
RDW: 14.3 % (ref 11.5–15.5)

## 2011-07-31 LAB — HEMOGLOBIN A1C
Hgb A1c MFr Bld: 5.6 % (ref <5.7)
Mean Plasma Glucose: 114 mg/dL (ref <117)

## 2011-07-31 LAB — BASIC METABOLIC PANEL
BUN: 9 mg/dL (ref 6–23)
Calcium: 8.1 mg/dL — ABNORMAL LOW (ref 8.4–10.5)
Creatinine, Ser: 0.6 mg/dL (ref 0.50–1.35)
GFR calc Af Amer: >90 mL/min (ref >90)
GFR calc non Af Amer: >90 mL/min (ref >90)

## 2011-07-31 NOTE — Progress Notes (Signed)
Subjective: He says he feels fine, never felt bad. Still spiking temps up to 102.6 yesterday.  WBC still in 11-12K range.  Objective: Vital signs in last 24 hours: Temp:  [98.9 F (37.2 C)-102.6 F (39.2 C)] 98.9 F (37.2 C) (03/14 1100) Pulse Rate:  [108-125] 111  (03/14 1100) Resp:  [18] 18  (03/14 1100) BP: (93-114)/(50-71) 93/57 mmHg (03/14 1100) SpO2:  [96 %-98 %] 96 % (03/14 1100) Last BM Date: 07/30/11  Intake/Output from previous day: 03/13 0701 - 03/14 0700 In: 1760 [P.O.:960; I.V.:600; IV Piggyback:200] Out: 4098 [Urine:3875; Stool:2] Intake/Output this shift: Total I/O In: -  Out: 901 [Urine:900; Stool:1]  PE:  The right hip and back were examined.  They are essentially unchanged. No new tenderness or erythema.  He is getting wet to dry dressing changes BID.  I see no fluctulant areas, no erythema, or new tenderness.  Lab Results:   Basename 07/31/11 0400 07/30/11 0355  WBC 11.1* 12.4*  HGB 10.5* 11.1*  HCT 32.9* 34.8*  PLT 572* 529*    BMET  Basename 07/31/11 0400 07/29/11 0400  NA 136 136  K 4.3 3.6  CL 102 104  CO2 26 26  GLUCOSE 112* 135*  BUN 9 6  CREATININE 0.60 0.52  CALCIUM 8.1* 8.0*   PT/INR No results found for this basename: LABPROT:2,INR:2 in the last 72 hours   Studies/Results: No results found.  Anti-infectives: Anti-infectives     Start     Dose/Rate Route Frequency Ordered Stop   07/28/11 1800   vancomycin (VANCOCIN) IVPB 1000 mg/200 mL premix        1,000 mg 200 mL/hr over 60 Minutes Intravenous Every 8 hours 07/28/11 1647     07/28/11 1800  piperacillin-tazobactam (ZOSYN) IVPB 3.375 g       3.375 g 12.5 mL/hr over 240 Minutes Intravenous Every 8 hours 07/28/11 1647     07/26/11 1600   Levofloxacin (LEVAQUIN) IVPB 750 mg  Status:  Discontinued        750 mg 100 mL/hr over 90 Minutes Intravenous Every 24 hours 07/26/11 1520 07/28/11 1625   07/26/11 0015   clindamycin (CLEOCIN) IVPB 900 mg        900 mg 100 mL/hr  over 30 Minutes Intravenous  Once 07/26/11 0009 07/26/11 0105         Current Facility-Administered Medications  Medication Dose Route Frequency Provider Last Rate Last Dose  . 0.9 %  sodium chloride infusion   Intravenous Continuous Standley Brooking, MD 75 mL/hr at 07/29/11 1833    . acetaminophen (TYLENOL) tablet 650 mg  650 mg Oral Q6H PRN Standley Brooking, MD   650 mg at 07/31/11 [phone]  . amantadine (SYMMETREL) capsule 100 mg  100 mg Oral BID Standley Brooking, MD   100 mg at 07/31/11 0939  . calcium carbonate (OS-CAL - dosed in mg of elemental calcium) tablet 500 mg of elemental calcium  1 tablet Oral Daily Houston Siren, MD   500 mg of elemental calcium at 07/31/11 0938  . collagenase (SANTYL) ointment   Topical Daily Standley Brooking, MD      . dantrolene (DANTRIUM) capsule 25 mg  25 mg Oral TID Houston Siren, MD   25 mg at 07/31/11 0939  . enoxaparin (LOVENOX) injection 40 mg  40 mg Subcutaneous Q24H Houston Siren, MD   40 mg at 07/31/11 0853  . gabapentin (NEURONTIN) capsule 600 mg  600 mg Oral QID Houston Siren, MD   600  mg at 07/31/11 0939  . guaiFENesin-dextromethorphan (ROBITUSSIN DM) 100-10 MG/5ML syrup 5 mL  5 mL Oral Q4H PRN Cristal Ford, MD      . HYDROcodone-acetaminophen [facility]) 5-325 MG per tablet 1 tablet  1 tablet Oral Q4H PRN Standley Brooking, MD   1 tablet at 07/31/11 [phone]  . interferon beta-1b (BETASERON) injection 0.25 mg  0.25 mg Subcutaneous QODAY Houston Siren, MD   0.25 mg at 07/30/11 1016  . modafinil (PROVIGIL) tablet 200 mg  200 mg Oral Daily Houston Siren, MD   200 mg at 07/31/11 0939  . piperacillin-tazobactam (ZOSYN) IVPB 3.375 g  3.375 g Intravenous Q8H Gwen Her, PHARMD   3.375 g at 07/31/11 9604  . polyethylene glycol (MIRALAX / GLYCOLAX) packet 17 g  17 g Oral Daily Standley Brooking, MD   17 g at 07/31/11 0939  . vancomycin (VANCOCIN) IVPB 1000 mg/200 mL premix  1,000 mg Intravenous Q8H Gwen Her, PHARMD   1,000 mg at 07/31/11 0939    Assessment/Plan 1.  Multiple sclerosis with marked debilitation.  2. Fall 2 weeks ago, he was hospitalized and sent home. Now presents with rhabdomyolysis after being on the floor for 72 hours or more at home.  3. Multiple wounds/ulcers of the skin/some skin necrosis as described above. S/P some debridement of Right hip eschar.07/29/11 4. Sepsis, tachycardia, fever, hypotension.  5. Dehydration  Plan:  Continue BID dressing changes.     LOS: 6 days    Porter Moes 07/31/2011

## 2011-07-31 NOTE — Progress Notes (Signed)
CSW spoke with patient re: [employer]. [facility] (the SNF he had chosen) had faxed clinical information over to [employer] to obtain insurance authorization and found out his insurance policy had termed 06/19/11. CSW confirmed that with [facility], Deanna Artis (ph#: [phone]) [employer] spoke with Deanna Artis and told her that patient would still be able to get insurance re-instated. Patient was made aware & stated he would be calling [employer] today, CSW checked with him at 3:00 and he had his laptop open and stated he was working on it now. CSW left message for Deanna Artis to find out a phone number for [employer] to get insurance re-instated should patient need help with that process, though when CSW offered help he declined. CSW will check back later today.  Unice Bailey, LCSWA [phone]

## 2011-07-31 NOTE — Progress Notes (Signed)
Occupational Therapy Treatment Patient Details Name: [name] Mccoy MRN: [id number] DOB: 08-15-1960 Today's Date: 07/31/2011  OT Assessment/Plan OT Assessment/Plan Comments on Treatment Session: Focus on staff education. Written instructions placed above bed to improve positioning needs and prevent contracture development. Reviewed with pt. Pt verbalized understanding. Pt performing theraband ex independently. very motivated.  OT Plan: Discharge plan remains appropriate OT Frequency: Min 1X/week Recommendations for Other Services: Other (comment) Follow Up Recommendations: Skilled nursing facility Equipment Recommended: Defer to next venue OT Goals Acute Rehab OT Goals OT Goal Formulation: With patient Time For Goal Achievement: 2 weeks ADL Goals Pt Will Perform Lower Body Bathing: with mod assist;Supine, rolling right and/or left ADL Goal: Lower Body Bathing - Progress: Progressing toward goals Arm Goals Pt Will Complete Theraband Exer: Independently;2 sets;Level 1 Theraband;10 reps;Other (comment) Arm Goal: Theraband Exercises - Progress: Met Miscellaneous OT Goals Miscellaneous OT Goal #1: Pt will sit unsupported EOB with max A for supine to sit and min guard for 5 minutes in prep for seated adls OT Goal: Miscellaneous Goal #1 - Progress: Progressing toward goals Miscellaneous OT Goal #2: pt will roll to bil sides with mod A and rails for adls OT Goal: Miscellaneous Goal #2 - Progress: Progressing toward goals Miscellaneous OT Goal #3: new goal: Establish positioning program for BLE to decrease further knee flexion contractures and promote functional positioning for ADL and mobility. 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   ADL ADL ADL Comments: Discussed need with pt and nursding for pt to complete as much of ADL as possible @ bed level. Mobility  Bed Mobility Bed Mobility: Yes Rolling Left: 1: +1 Total assist;Patient percentage (comment) Transfers Transfers: No Exercises General Exercises - Upper Extremity Shoulder Flexion: Strengthening;Both;10 reps;15 reps;Supine;Theraband Shoulder Extension: AROM;Strengthening;Both;10 reps;Supine;Theraband Shoulder Horizontal ABduction: Strengthening;Both;10 reps;Supine;Theraband Elbow Flexion: Strengthening;Both;15 reps;Supine;Theraband Elbow Extension: Strengthening;Both;10 reps;Supine;Theraband Other Exercises Other Exercises: Gentle ROM and stretching of right leg.  Pt with increased tone into adduction, internal rotation and flexion, but pt able to extend to minus 30 degrees to full extension and maintian it with right leg.  Still not able to extend left leg much  End of Session OT - End of Session Activity Tolerance: Patient limited by fatigue;Patient limited by pain Patient left: in bed;with call bell in reach;Other (comment) Nurse Communication: Other (comment) (positioning) General Behavior During Session: [facility] for tasks performed Cognition: [facility] for tasks performed  [facility]  07/31/2011, 2:17 PM [facility], OTR/L  [phone] 07/31/2011

## 2011-07-31 NOTE — Progress Notes (Signed)
PROGRESS NOTE  [name] Mccoy QIO:[id number] DOB: 12-29-1960 DOA: 07/25/2011 PCP: No primary provider on file.  Brief narrative: 51 year old man with a PMH of multiple sclerosis who presented to the ER with a history of a fall approximately 5 days prior to admission, which resulted in prolonged immobility leading to dehydration and rhabdomyolysis.  He was also noted to be febrile and have cellulitis.  He initially improved but then developed recurrent fever. There is concern for the possibility of abscess of the right hip. CT scan however was unremarkable. General surgery seen the patient in consultation and debridement is being contemplated. Patient continues on broad-spectrum antibiotic therapy. Ultimately the patient will be going to skilled nursing facility for rehabilitation.  Assessment/Plan: Principal Problem:  *Rhabdomyolysis secondary to fall The patient was admitted and put on IV fluids for rehydration. His CK levels have gradually come down. His renal function is stable. Active Problems:  Multiple sclerosis Patient has limited mobility due to his history of multiple sclerosis. He will need skilled nursing home placement.  Dehydration Patient has been hydrated and this has now resolved.  Fecal impaction Patient has been put on daily MiraLAX. His bowels moved yesterday.  Normocytic anemia Mild and likely secondary to anemia of acute illness.  Wounds, multiple The patient has been seen by the wound care RN. He has multiple pressure ulcers. His right hip has unstageable wounds. His right back is unstageable wounds. He also has wounds to the left inner knee, right outer knee, right knee, and left hip. Wound care recommendations were noted. Patient will continue on Santyl for chemical debridement of nonviable tissue and foam dressings to help with pressure reduction. The patient has been seen by general surgery for evaluation of the need for debridement of his wounds. Dr. Abbey Chatters plans to  debride his right hip wound at the bedside. He recommended hydrotherapy for the other wounds.  Hyperglycemia Patient's fasting blood glucoses have been elevated. He does not have a known history of diabetes. We'll check a hemoglobin A1c.  Code Status: Full Family Communication: AVEDIS, BEVIS [id number] Disposition Plan: SNF placement  Medical Consultants:  Dr. Faith Rogue, Physical Medicine and Rehabilitation: Skilled nursing home placement recommended.  Dr. Avel Peace, Surgery: Plans to debride the right hip wound at the bedside; Hydrotherapy recommended for other wounds.  Other Consultants:  Physical therapy: Inpatient rehabilitation, skilled or LTACH   Occupational therapy: Skilled nursing facility   Wound care   Procedures:  None  Antibiotics:  Clindamycin 07/26/11 ---> 07/26/11  Levaquin 07/26/11 ---> 07/28/11  Zosyn 07/28/11 --->  Vancomycin 07/28/11 --->   Subjective  [name] Mccoy feels well. He states his bowels moved several times yesterday. No nausea, vomiting, or diarrhea.  Appetite is good.   Objective    Interim History: [name] Mccoy was stable overnight.   Objective: Filed Vitals:   07/30/11 1349 07/30/11 2045 07/30/11 2309 07/31/11 0543  BP: 114/71 94/50  95/61  Pulse: 108 125  117  Temp: 99.2 F (37.3 C) 102.6 F (39.2 C) 99.5 F (37.5 C) 102.6 F (39.2 C)  TempSrc: Oral Oral Oral Oral  Resp: 18 18  18   Height:      Weight:      SpO2: 98% 96%  98%    Intake/Output Summary (Last 24 hours) at 07/31/11 0909 Last data filed at 07/31/11 0500  Gross per 24 hour  Intake   1760 ml  Output   3877 ml  Net  -2117 ml    Exam: Gen:  NAD  Cardiovascular:  RRR, No M/R/G Respiratory: Lungs CTAB Gastrointestinal: Abdomen soft, NT/ND with normal active bowel sounds. Extremities: No C/E/C   Data Reviewed: Basic Metabolic Panel:  Lab 07/31/11 4098 07/29/11 0400 07/27/11 0422 07/26/11 0538 07/25/11 2040  NA 136 136 134* 135 134*  K 4.3 3.6  -- -- --  CL 102 104 105 103 93*  CO2 26 26 25 25 30   GLUCOSE 112* 135* 107* 188* 101*  BUN 9 6 16 17 19   CREATININE 0.60 0.52 0.54 0.57 0.63  CALCIUM 8.1* 8.0* 7.6* 8.2* 9.6  MG -- -- -- -- --  PHOS -- -- -- -- --   Liver Function Tests:  Lab 07/25/11 2040  AST 40*  ALT 28  ALKPHOS 78  BILITOT 0.7  PROT 7.7  ALBUMIN 2.8*   CBC:  Lab 07/31/11 0400 07/30/11 0355 07/29/11 0400 07/26/11 0538 07/25/11 2040  WBC 11.1* 12.4* 9.6 12.1* 12.5*  NEUTROABS -- -- -- -- 9.9*  HGB 10.5* 11.1* 10.8* 11.8* 13.5  HCT 32.9* 34.8* 33.3* 35.9* 42.5  MCV 82.5 83.3 82.8 81.4 82.5  PLT 572* 529* 375 406* 474*   Cardiac Enzymes:  Lab 07/27/11 0422 07/25/11 2040  CKTOTAL 587* 1526*  CKMB 3.2 --  CKMBINDEX -- --  TROPONINI -- --     Recent Results (from the past 240 hour(s))  MRSA PCR SCREENING     Status: Normal   Collection Time   07/26/11  5:21 AM      Component Value Range Status Comment   MRSA by PCR NEGATIVE  NEGATIVE  Final   CULTURE, BLOOD (ROUTINE X 2)     Status: Normal (Preliminary result)   Collection Time   07/28/11  4:55 PM      Component Value Range Status Comment   Specimen Description BLOOD LEFT HAND   Final    Special Requests BOTTLES DRAWN AEROBIC ONLY 3CC   Final    Culture  Setup Time [id number]   Final    Culture     Final    Value:        BLOOD CULTURE RECEIVED NO GROWTH TO DATE CULTURE WILL BE HELD FOR 5 DAYS BEFORE ISSUING A FINAL NEGATIVE REPORT   Report Status PENDING   Incomplete   CULTURE, BLOOD (ROUTINE X 2)     Status: Normal (Preliminary result)   Collection Time   07/28/11  5:00 PM      Component Value Range Status Comment   Specimen Description BLOOD LEFT HAND   Final    Special Requests BOTTLES DRAWN AEROBIC ONLY 3CC   Final    Culture  Setup Time [id number]   Final    Culture     Final    Value:        BLOOD CULTURE RECEIVED NO GROWTH TO DATE CULTURE WILL BE HELD FOR 5 DAYS BEFORE ISSUING A FINAL NEGATIVE REPORT   Report Status PENDING    Incomplete   URINE CULTURE     Status: Normal   Collection Time   07/28/11  6:43 PM      Component Value Range Status Comment   Specimen Description URINE, CATHETERIZED   Final    Special Requests NONE   Final    Culture  Setup Time [id number]   Final    Colony Count NO GROWTH   Final    Culture NO GROWTH   Final    Report Status 07/29/2011 FINAL   Final      Studies:  Ct Pelvis W Contrast 07/29/2011   IMPRESSION: Cholelithiasis is suspected.  Fecal impaction in the rectum is noted.  The rectal wall is edematous and thickened.  No evidence of subcutaneous abscess.  Stranding in the subcutaneous fat of the buttock is noted which may represent cellulitis.  No convincing evidence of a destructive bony process to suggest osteomyelitis.  Scrotal hydrocele is suspected.  Original Report Authenticated By: Donavan Burnet, M.D.    Scheduled Meds:    . amantadine  100 mg Oral BID  . calcium carbonate  1 tablet Oral Daily  . collagenase   Topical Daily  . dantrolene  25 mg Oral TID  . enoxaparin  40 mg Subcutaneous Q24H  . gabapentin  600 mg Oral QID  . interferon beta-1b  0.25 mg Subcutaneous QODAY  . modafinil  200 mg Oral Daily  . piperacillin-tazobactam (ZOSYN)  IV  3.375 g Intravenous Q8H  . polyethylene glycol  17 g Oral Daily  . vancomycin  1,000 mg Intravenous Q8H   Continuous Infusions:    . sodium chloride 75 mL/hr at 07/29/11 1833      LOS: 6 days   [name] Aldo, MD Pager [phone]  07/31/2011, 9:09 AM

## 2011-07-31 NOTE — Progress Notes (Signed)
Occupational Therapy Treatment Patient Details Name: [name] Mccoy MRN: [id number] DOB: 1961/05/01 Today's Date: 07/31/2011  OT Assessment/Plan OT Assessment/Plan Comments on Treatment Session: Focus of session today on bed positioning to decrease further contractures and consulting with orthotist regarding LLE knee splint. Pt also developing B hip flexor contractures. Discussed importance of hip flexure streching/prevention of contractures to enable mobility in future. Pt verbalized understanding. after stretching, able to achieve @ extension of L knee to end point of @ 30. R knee - able to almost reach end range and would not splint at this time. Deep pressure provided over hip flexor tendons to decrease tone prior to stretching. using contract/relax technique to gain ROM. Also focused on establishing theraband HEP. Pt issued theraband and performing with min demonstrational cues. notes left for proper bed positioning for nursing. Received telephone order from Dr. Darnelle Catalan for L knee splint. OT Plan: Discharge plan remains appropriate OT Frequency: Min 1X/week Recommendations for Other Services: Other (comment) (orthotic consult) Follow Up Recommendations: Skilled nursing facility Equipment Recommended: Defer to next venue OT Goals Acute Rehab OT Goals OT Goal Formulation: With patient Time For Goal Achievement: 2 weeks ADL Goals Pt Will Perform Lower Body Bathing: with mod assist;Supine, rolling right and/or left ADL Goal: Lower Body Bathing - Progress: Progressing toward goals Arm Goals Pt Will Complete Theraband Exer: Independently;2 sets;Level 1 Theraband;10 reps;Other (comment) Arm Goal: Theraband Exercises - Progress: Progressing toward goal Miscellaneous OT Goals Miscellaneous OT Goal #1: Pt will sit unsupported EOB with max A for supine to sit and min guard for 5 minutes in prep for seated adls OT Goal: Miscellaneous Goal #1 - Progress: Met Miscellaneous OT Goal #2: pt will roll to  bil sides with mod A and rails for adls OT Goal: Miscellaneous Goal #2 - Progress: Progressing toward goals Miscellaneous OT Goal #3: new goal: Establish positioning program for BLE to decrease further knee flexion contractures and promote functional positioning for ADL and mobility. OT Goal: Miscellaneous Goal #3 - Progress: Progressing toward goals OT Goal: Miscellaneous Goal #4 - Progress: Progressing toward goals  OT Treatment Precautions/Restrictions  Restrictions Weight Bearing Restrictions: No     Mobility  Bed Mobility Bed Mobility: Yes Rolling Left: 1: +1 Total assist;Patient percentage (comment) Transfers Transfers: No Exercises General Exercises - Upper Extremity Shoulder Flexion: Strengthening;Both;10 reps;15 reps;Supine;Theraband Shoulder Extension: AROM;Strengthening;Both;10 reps;Supine;Theraband Shoulder Horizontal ABduction: Strengthening;Both;10 reps;Supine;Theraband Elbow Flexion: Strengthening;Both;15 reps;Supine;Theraband Elbow Extension: Strengthening;Both;10 reps;Supine;Theraband Other Exercises Other Exercises: Gentle ROM and stretching of right leg.  Pt with increased tone into adduction, internal rotation and flexion, but pt able to extend to minus 30 degrees to full extension and maintian it with right leg.  Still not able to extend left leg much  End of Session OT - End of Session Activity Tolerance: Patient limited by fatigue;Patient limited by pain Patient left: in bed;with call bell in reach;Other (comment) Nurse Communication: Other (comment) (positioning - bed/knees) General Behavior During Session: [facility] for tasks performed Cognition: [facility] for tasks performed  [facility]  07/31/2011, 12:54 PM [facility], OTR/L  [phone] 07/31/2011

## 2011-07-31 NOTE — Progress Notes (Signed)
Physical Therapy Wound Treatment Patient Details  Name: [name] Mccoy MRN: [id number] Date of Birth: 1960-06-16  Today's Date: 07/31/2011 Time:  - 1005-1035    Subjective     Pain Score: Pain Score: 0-No pain  Wound Assessment  Pressure Ulcer 07/26/11 Stage III -  Full thickness tissue loss. Subcutaneous fat may be visible but bone, tendon or muscle are NOT exposed. measuring 7.5 width by 9.5 (Active)  State of Healing Other (Comment) 07/31/2011 10:53 AM  Site / Wound Assessment Black;Granulation tissue;Yellow;Other (Comment) 07/31/2011 10:53 AM  % Wound base Red or Granulating 0% 07/31/2011 10:53 AM  % Wound base Yellow 20% 07/31/2011 10:53 AM  % Wound base Black 40% 07/31/2011 10:53 AM  % Wound base Other (Comment) 40% 07/31/2011 10:53 AM  Peri-wound Assessment Intact 07/31/2011 10:53 AM  Tunneling (cm) no 07/29/2011 10:30 AM  Undermining (cm) no 07/29/2011 10:30 AM  Margins Unattacted edges (unapproximated) 07/31/2011 10:53 AM  Drainage Amount Minimal 07/31/2011 10:53 AM  Drainage Description Serosanguineous 07/31/2011 10:53 AM  Treatment Cleansed;Hydrotherapy (Pulse lavage) 07/31/2011 10:53 AM  Dressing Type Moist to dry;Silicone dressing;Other (Comment);Transparent dressing 07/31/2011 10:53 AM  Dressing Changed 07/31/2011 10:53 AM     Pressure Ulcer 07/26/11 Stage III -  Full thickness tissue loss. Subcutaneous fat may be visible but bone, tendon or muscle are NOT exposed. measuring 7.0 x 4 (Active)  State of Healing Other (Comment) 07/31/2011 10:53 AM  Site / Wound Assessment Black;Brown;Yellow;Other (Comment) 07/31/2011 10:53 AM  % Wound base Red or Granulating 10% 07/31/2011 10:53 AM  % Wound base Yellow 60% 07/31/2011 10:53 AM  % Wound base Black 30% 07/31/2011 10:53 AM  Peri-wound Assessment Intact 07/31/2011 10:53 AM  Tunneling (cm) no 07/29/2011 10:30 AM  Undermining (cm) no 07/29/2011 10:30 AM  Margins Unattacted edges (unapproximated) 07/31/2011 10:53 AM  Drainage Amount Minimal  07/31/2011 10:53 AM  Drainage Description Serosanguineous 07/31/2011 10:53 AM  Treatment Cleansed;Hydrotherapy (Pulse lavage) 07/31/2011 10:53 AM  Dressing Type Transparent dressing;Moist to dry;Silicone dressing 07/31/2011 10:53 AM  Dressing Changed 07/31/2011 10:53 AM     Pressure Ulcer 07/26/11 Stage II -  Partial thickness loss of dermis presenting as a shallow open ulcer with a red, pink wound bed without slough. measuring 23 x 6 (Active)  State of Healing Early/partial granulation 07/31/2011 10:53 AM  Site / Wound Assessment Granulation tissue;Black;Yellow;Red 07/31/2011 10:53 AM  % Wound base Red or Granulating 40% 07/31/2011 10:53 AM  % Wound base Yellow 10% 07/31/2011 10:53 AM  % Wound base Black 50% 07/31/2011 10:53 AM  Peri-wound Assessment Denuded;Other (Comment) 07/30/2011  8:57 PM  Tunneling (cm) no 07/29/2011 10:30 AM  Undermining (cm) no 07/29/2011 10:30 AM  Margins Unattacted edges (unapproximated) 07/31/2011 10:53 AM  Drainage Amount Minimal 07/31/2011 10:53 AM  Drainage Description Serosanguineous 07/31/2011 10:53 AM  Treatment Cleansed;Hydrotherapy (Pulse lavage) 07/31/2011 10:53 AM  Dressing Type Gauze (Comment);Moist to dry;Silicone dressing;Transparent dressing 07/31/2011 10:53 AM  Dressing Changed 07/31/2011 10:53 AM     Pressure Ulcer 07/26/11 Stage II -  Partial thickness loss of dermis presenting as a shallow open ulcer with a red, pink wound bed without slough. measuring 2 x2 (Active)  Dressing Type Silicone dressing 07/29/2011  7:26 AM  Dressing Clean;Dry;Intact 07/31/2011  8:00 AM     Pressure Ulcer 07/26/11 Stage II -  Partial thickness loss of dermis presenting as a shallow open ulcer with a red, pink wound bed without slough. measuring 1 x 1 (Active)  Dressing Type Silicone dressing 07/27/2011  9:54 AM  Dressing Clean;Dry;Intact 07/31/2011  8:00 AM     Pressure Ulcer 07/26/11 Stage II -  Partial thickness loss of dermis presenting as a shallow open ulcer with a red, pink wound  bed without slough. healing (Active)  Dressing Type Silicone dressing 07/27/2011  9:54 AM  Dressing Clean;Dry;Intact 07/31/2011  8:00 AM   Hydrotherapy Pulsed lavage therapy - wound location: right back, two area with thick black eschar on right hip Pulsed Lavage with Suction (psi): 12 psi Pulsed Lavage with Suction - Normal Saline Used: 2000 mL Pulsed Lavage Tip: Tip with splash shield Selective Debridement Selective Debridement - Location: small area on right back and small area on right hip Selective Debridement - Tools Used: Forceps;Scissors Selective Debridement - Tissue Removed: loose black eschar   Wound Assessment and Plan   Noted granulation buds are becoming prominent through slough in multiple wound sites, continue with current treatment.    Wound Therapy Goals- Improve the function of patient's integumentary system by progressing the wound(s) through the phases of wound healing (inflammation - proliferation - remodeling) by:    Goals will be updated until maximal potential achieved or discharge criteria met.  Discharge criteria: when goals achieved, discharge from hospital, MD decision/surgical intervention, no progress towards goals, refusal/missing three consecutive treatments without notification or medical reason.  Page, Meribeth Mattes 07/31/2011, 11:11 AM

## 2011-07-31 NOTE — Progress Notes (Addendum)
AAOx3 : Speech clear patient premedicated prior to wound dressing changes. Approximately, 1000A on 07/31/11, Mr. Costantino Kohlbeck received a dressing change to his back, and hip wounds, this care was provided by the wound care team.Collagenase was applied directly to the area then saline gauze was placed on top and covered by dry bandage. Resting w/o additional c/o.Antonieta Iba, Student Dillard Cannon, RN.

## 2011-08-01 LAB — CBC
MCV: 81.6 fL (ref 78.0–100.0)
Platelets: 602 10*3/uL — ABNORMAL HIGH (ref 150–400)
RBC: 3.76 MIL/uL — ABNORMAL LOW (ref 4.22–5.81)
RDW: 14.4 % (ref 11.5–15.5)
WBC: 11.9 10*3/uL — ABNORMAL HIGH (ref 4.0–10.5)

## 2011-08-01 MED ORDER — HYDROCODONE-ACETAMINOPHEN 5-325 MG PO TABS: 1.0000 | ORAL_TABLET | ORAL | Status: AC | PRN

## 2011-08-01 MED ORDER — CIPROFLOXACIN HCL 500 MG PO TABS: 500.0000 mg | ORAL_TABLET | Freq: Two times a day (BID) | ORAL | Status: DC

## 2011-08-01 MED ORDER — COLLAGENASE 250 UNIT/GM EX OINT: TOPICAL_OINTMENT | Freq: Every day | CUTANEOUS | Status: AC

## 2011-08-01 MED ORDER — POLYETHYLENE GLYCOL 3350 17 G PO PACK: 17.0000 g | PACK | Freq: Every day | ORAL | Status: AC

## 2011-08-01 MED ORDER — DOXYCYCLINE HYCLATE 100 MG PO TABS: 100.0000 mg | ORAL_TABLET | Freq: Two times a day (BID) | ORAL | Status: DC

## 2011-08-01 MED ORDER — LUBRIDERM SERIOUSLY SENSITIVE EX LOTN
TOPICAL_LOTION | Freq: Two times a day (BID) | CUTANEOUS | Status: DC
  Administered 2011-08-01 – 2011-08-04 (×5): via TOPICAL
  Filled 2011-08-01: qty 473

## 2011-08-01 NOTE — Progress Notes (Signed)
Pt does not have BCBS auth for SNF today. Anticipate auth to be in place for SNF on Monday. Pt, MD, and RNCM aware. CSW will f/u Monday morning.  Dellie Burns, MSW, ZOXWR [phone] (cover for [employer].)

## 2011-08-01 NOTE — Discharge Instructions (Signed)

## 2011-08-01 NOTE — Progress Notes (Signed)
Physical Therapy Treatment Patient Details Name: [name] Mccoy MRN: [id number] DOB: [birth year] Today's Date: 08/01/2011  PT Assessment/Plan  PT - Assessment/Plan Comments on Treatment Session: pt continues with pain in hips but better able to tolerate ROM today. Left leg in better position with knee immoblizer.  Pt ready for continued rehab at SNF PT Plan: Discharge plan remains appropriate;Frequency remains appropriate PT Frequency: Min 3X/week Follow Up Recommendations: Skilled nursing facility Equipment Recommended: Defer to next venue PT Goals  Acute Rehab PT Goals PT Goal: Rolling Supine to Right Side - Progress: Progressing toward goal  PT Treatment Precautions/Restrictions  Precautions Precautions: Fall Precaution Comments: pt with MS Required Braces or Orthoses: Yes Knee Immobilizer: Other (comment) (pt has open anterior Kneed Immobilizer of Left LE) Restrictions Weight Bearing Restrictions: No Other Position/Activity Restrictions: encouragement of LEs in extension and external rotation Mobility (including Balance) Bed Mobility Bed Mobility: Yes Rolling Right: 3: Mod assist Rolling Right Details (indicate cue type and reason): assist at pelvis to pull hips over to sidelying Rolling Left: 3: Mod assist Rolling Left Details (indicate cue type and reason): slow needs assist at pelvis Transfers Transfers: No Ambulation/Gait Ambulation/Gait: No Stairs: No Wheelchair Mobility Wheelchair Mobility: No  Posture/Postural Control Posture/Postural Control: Postural limitations Postural Limitations: pt with diffuse muscles atrophy.  pt with multiple wounds on back and hips.  Pt unable to straighten either leg due to pain groin Balance Balance Assessed: No Exercise  General Exercises - Upper Extremity Elbow Extension: Strengthening;Theraband;10 reps;Supine Theraband Level (Elbow Extension): Other (comment) (orange) General Exercises - Lower Extremity Ankle Circles/Pumps:  PROM;Both;5 reps;Supine Heel Slides: PROM;Right;10 reps;Supine Hip ABduction/ADduction: Both;5 reps;Supine;PROM Other Exercises Other Exercises: Assisted rolling to right with contract/ relax and core muscles to stabalize pelvis in sidelying and assist with active rolling End of Session PT - End of Session Equipment Utilized During Treatment: Left knee immobilizer Activity Tolerance: Patient limited by pain Patient left: in bed;with call bell in reach General Behavior During Session: [facility] for tasks performed Cognition: [facility] for tasks performed  Donnetta Hail 08/01/2011, 11:54 AM

## 2011-08-01 NOTE — Progress Notes (Signed)
  Subjective: Comfortable.  Objective: Vital signs in last 24 hours: Temp:  [99.1 F (37.3 C)-101.7 F (38.7 C)] 99.7 F (37.6 C) (03/15 0558) Pulse Rate:  [113-128] 121  (03/15 0558) Resp:  [16-18] 16  (03/15 0558) BP: (97-117)/(58-79) 112/76 mmHg (03/15 0558) SpO2:  [96 %-98 %] 97 % (03/15 0558) Last BM Date: 07/30/11  Intake/Output from previous day: 03/14 0701 - 03/15 0700 In: 240 [P.O.:240] Out: 3476 [Urine:3475; Stool:1] Intake/Output this shift: Total I/O In: 240 [P.O.:240] Out: -   PE: All wounds look fairly good.  Eschars are coming off with Santyl.  No evidence of infection. Some peeling skin on lower extremities. Lab Results:   Basename 08/01/11 0355 07/31/11 0400  WBC 11.9* 11.1*  HGB 10.1* 10.5*  HCT 30.7* 32.9*  PLT 602* 572*   BMET  Basename 07/31/11 0400  NA 136  K 4.3  CL 102  CO2 26  GLUCOSE 112*  BUN 9  CREATININE 0.60  CALCIUM 8.1*   PT/INR No results found for this basename: LABPROT:2,INR:2 in the last 72 hours Comprehensive Metabolic Panel:    Component Value Date/Time   NA 136 07/31/2011 0400   K 4.3 07/31/2011 0400   CL 102 07/31/2011 0400   CO2 26 07/31/2011 0400   BUN 9 07/31/2011 0400   CREATININE 0.60 07/31/2011 0400   GLUCOSE 112* 07/31/2011 0400   CALCIUM 8.1* 07/31/2011 0400   AST 40* 07/25/2011 2040   ALT 28 07/25/2011 2040   ALKPHOS 78 07/25/2011 2040   BILITOT 0.7 07/25/2011 2040   PROT 7.7 07/25/2011 2040   ALBUMIN 2.8* 07/25/2011 2040     Studies/Results: No results found.  Anti-infectives: Anti-infectives     Start     Dose/Rate Route Frequency Ordered Stop   08/01/11 0000   ciprofloxacin (CIPRO) 500 MG tablet        500 mg Oral 2 times daily 08/01/11 1051 08/11/11 2359   08/01/11 0000   doxycycline (VIBRA-TABS) 100 MG tablet        100 mg Oral 2 times daily 08/01/11 1051 08/11/11 2359   07/28/11 1800   vancomycin (VANCOCIN) IVPB 1000 mg/200 mL premix        1,000 mg 200 mL/hr over 60 Minutes Intravenous Every 8  hours 07/28/11 1647     07/28/11 1800  piperacillin-tazobactam (ZOSYN) IVPB 3.375 g       3.375 g 12.5 mL/hr over 240 Minutes Intravenous Every 8 hours 07/28/11 1647     07/26/11 1600   Levofloxacin (LEVAQUIN) IVPB 750 mg  Status:  Discontinued        750 mg 100 mL/hr over 90 Minutes Intravenous Every 24 hours 07/26/11 1520 07/28/11 1625   07/26/11 0015   clindamycin (CLEOCIN) IVPB 900 mg        900 mg 100 mL/hr over 30 Minutes Intravenous  Once 07/26/11 0009 07/26/11 0105          Assessment Principal Problem: Wounds, multiple-Slowly healing.  No signs of infection of the wounds.    LOS: 7 days   Plan: Continue current wound care. Lotion to lower extremities   [name] Mccoy J 08/01/2011

## 2011-08-01 NOTE — Progress Notes (Signed)
Pt does have bed at [facility] pending his insurance payment clearance. [employer] reported to facility that until his payment clears and is updated in their system, they will not authorize SNF. Hopeful for payment clearance before end of business day today. BCBS not open over w/e so pt's d/c could potentially be Monday if not today. CSW to update MD and pt. Laporsha Grealish, MSW, LCSWA [phone] 

## 2011-08-01 NOTE — Discharge Summary (Signed)
Physician Discharge Summary  Patient ID: [name] Mccoy MRN: [id number] DOB/AGE: 49-16-1962 49 y.o.  Admit date: 07/25/2011 Discharge date: 08/01/2011  Primary Care Physician:  No primary provider on file. Neurologist: Avie Echevaria, MD   Discharge Diagnoses:    Present on Admission:  .Rhabdomyolysis .Multiple sclerosis .Dehydration .Fecal impaction .Normocytic anemia .Fall .Wounds, multiple  Discharge Medications:  Medication List  As of 08/01/2011 10:52 AM   TAKE these medications         amantadine 100 MG capsule   Commonly known as: SYMMETREL   Take 200 mg by mouth daily.      calcium carbonate 1250 MG tablet   Commonly known as: OS-CAL - dosed in mg of elemental calcium   Take 1 tablet by mouth daily.      ciprofloxacin 500 MG tablet   Commonly known as: CIPRO   Take 1 tablet (500 mg total) by mouth 2 (two) times daily. Take x 7 days      collagenase ointment   Commonly known as: SANTYL   Apply topically daily. Apply to non-viable tissue      dantrolene 25 MG capsule   Commonly known as: DANTRIUM   Take 25 mg by mouth 3 (three) times daily.      doxycycline 100 MG tablet   Commonly known as: VIBRA-TABS   Take 1 tablet (100 mg total) by mouth 2 (two) times daily. Take x 7 days      gabapentin 600 MG tablet   Commonly known as: NEURONTIN   Take 600 mg by mouth 4 (four) times daily.      HYDROcodone-acetaminophen 5-325 MG per tablet   Commonly known as: NORCO   Take 1 tablet by mouth every 4 (four) hours as needed.      interferon beta-1b 0.3 MG injection   Commonly known as: BETASERON   Inject 0.25 mg into the skin every other day. Next dose 07-11-2011      modafinil 200 MG tablet   Commonly known as: PROVIGIL   Take 200 mg by mouth daily.      polyethylene glycol packet   Commonly known as: MIRALAX / GLYCOLAX   Take 17 g by mouth daily.             Disposition and Follow-up: The patient is being discharged to a SNF.  He is instructed to follow  up with the PCP at the SNF in one week.   Medical Consults:  Dr. Faith Rogue, Physical Medicine and Rehabilitation: Skilled nursing home placement recommended.  Dr. Avel Peace, Surgery: S/P debridement of the right hip wound at the bedside; Hydrotherapy recommended for other wounds.   Other Consults:  Physical therapy: Inpatient rehabilitation, skilled or LTACH   Occupational therapy: Skilled nursing facility   Wound care: Santyl for chemical debridement of nonviable tissue; Foam dressings to areas without eschar. Optimize nutrition. Air mattress for pressure reduction.   Significant Diagnostic Studies:    Ct Pelvis W Contrast 08-25-2011 IMPRESSION: Cholelithiasis is suspected.  Fecal impaction in the rectum is noted.  The rectal wall is edematous and thickened.  No evidence of subcutaneous abscess.  Stranding in the subcutaneous fat of the buttock is noted which may represent cellulitis.  No convincing evidence of a destructive bony process to suggest osteomyelitis.  Scrotal hydrocele is suspected.  Original Report Authenticated By: Donavan Burnet, M.D.    Discharge Laboratory Values: Basic Metabolic Panel:  Lab 07/31/11 4098 25-Aug-2011 0400 07/27/11 0422 07/26/11 0538 07/25/11 2040  NA 136 136 134* 135 134*  K 4.3 3.6 -- -- --  CL 102 104 105 103 93*  CO2 26 26 25 25 30   GLUCOSE 112* 135* 107* 188* 101*  BUN 9 6 16 17 19   CREATININE 0.60 0.52 0.54 0.57 0.63  CALCIUM 8.1* 8.0* 7.6* 8.2* 9.6  MG -- -- -- -- --  PHOS -- -- -- -- --   GFR Estimated Creatinine Clearance: 101.6 ml/min (by C-G formula based on Cr of 0.6). Liver Function Tests:  Lab 07/25/11 2040  AST 40*  ALT 28  ALKPHOS 78  BILITOT 0.7  PROT 7.7  ALBUMIN 2.8*    CBC:  Lab 08/01/11 0355 07/31/11 0400 07/30/11 0355 07/29/11 0400 07/26/11 0538 07/25/11 2040  WBC 11.9* 11.1* 12.4* 9.6 12.1* --  NEUTROABS -- -- -- -- -- 9.9*  HGB 10.1* 10.5* 11.1* 10.8* 11.8* --  HCT 30.7* 32.9* 34.8* 33.3* 35.9*  --  MCV 81.6 82.5 83.3 82.8 81.4 --  PLT 602* 572* 529* 375 406* --   Cardiac Enzymes:  Lab 07/27/11 0422 07/25/11 2040  CKTOTAL 587* 1526*  CKMB 3.2 --  CKMBINDEX -- --  TROPONINI -- --   Hgb A1c  Basename 07/31/11 0400  HGBA1C 5.6   Microbiology Recent Results (from the past 240 hour(s))  MRSA PCR SCREENING     Status: Normal   Collection Time   07/26/11  5:21 AM      Component Value Range Status Comment   MRSA by PCR NEGATIVE  NEGATIVE  Final   CULTURE, BLOOD (ROUTINE X 2)     Status: Normal (Preliminary result)   Collection Time   07/28/11  4:55 PM      Component Value Range Status Comment   Specimen Description BLOOD LEFT HAND   Final    Special Requests BOTTLES DRAWN AEROBIC ONLY 3CC   Final    Culture  Setup Time [id number]   Final    Culture     Final    Value:        BLOOD CULTURE RECEIVED NO GROWTH TO DATE CULTURE WILL BE HELD FOR 5 DAYS BEFORE ISSUING A FINAL NEGATIVE REPORT   Report Status PENDING   Incomplete   CULTURE, BLOOD (ROUTINE X 2)     Status: Normal (Preliminary result)   Collection Time   07/28/11  5:00 PM      Component Value Range Status Comment   Specimen Description BLOOD LEFT HAND   Final    Special Requests BOTTLES DRAWN AEROBIC ONLY 3CC   Final    Culture  Setup Time [id number]   Final    Culture     Final    Value:        BLOOD CULTURE RECEIVED NO GROWTH TO DATE CULTURE WILL BE HELD FOR 5 DAYS BEFORE ISSUING A FINAL NEGATIVE REPORT   Report Status PENDING   Incomplete   URINE CULTURE     Status: Normal   Collection Time   07/28/11  6:43 PM      Component Value Range Status Comment   Specimen Description URINE, CATHETERIZED   Final    Special Requests NONE   Final    Culture  Setup Time [id number]   Final    Colony Count NO GROWTH   Final    Culture NO GROWTH   Final    Report Status 07/29/2011 FINAL   Final   CULTURE, BLOOD (ROUTINE X 2)     Status: Normal (Preliminary  result)   Collection Time   07/30/11 10:45 PM      Component  Value Range Status Comment   Specimen Description BLOOD RIGHT HAND   Final    Special Requests BOTTLES DRAWN AEROBIC AND ANAEROBIC 2.5 CC EA   Final    Culture  Setup Time [id number]   Final    Culture     Final    Value:        BLOOD CULTURE RECEIVED NO GROWTH TO DATE CULTURE WILL BE HELD FOR 5 DAYS BEFORE ISSUING A FINAL NEGATIVE REPORT   Report Status PENDING   Incomplete   CULTURE, BLOOD (ROUTINE X 2)     Status: Normal (Preliminary result)   Collection Time   07/30/11 10:48 PM      Component Value Range Status Comment   Specimen Description BLOOD LEFT HAND   Final    Special Requests BOTTLES DRAWN AEROBIC AND ANAEROBIC 3 CC EA   Final    Culture  Setup Time [id number]   Final    Culture     Final    Value:        BLOOD CULTURE RECEIVED NO GROWTH TO DATE CULTURE WILL BE HELD FOR 5 DAYS BEFORE ISSUING A FINAL NEGATIVE REPORT   Report Status PENDING   Incomplete      Brief H and P: For complete details please refer to admission H and P, but in brief, [name] Mccoy is a 51 year old man with a PMH of multiple sclerosis who presented to the ER with a history of a fall approximately 5 days prior to admission, which resulted in prolonged immobility leading to dehydration and rhabdomyolysis. He was also noted to be febrile and have cellulitis.    Physical Exam at Discharge: BP 112/76  Pulse 121  Temp(Src) 99.7 F (37.6 C) (Oral)  Resp 16  Ht 6\' 3"  (1.905 m)  Wt 64.3 kg (141 lb 12.1 oz)  BMI 17.72 kg/m2  SpO2 97% Gen:  NAD Cardiovascular:  RRR, No M/R/G Respiratory: Lungs CTAB Gastrointestinal: Abdomen soft, NT/ND with normal active bowel sounds. Extremities: No C/E/C   Hospital Course:  Principal Problem:  *Rhabdomyolysis secondary to fall  The patient was admitted and put on IV fluids for rehydration. His CK levels gradually came down. His renal function is stable.  Active Problems:  Multiple sclerosis  Patient has limited mobility due to his history of multiple  sclerosis. Skilled nursing home placement was recommended.  Dehydration  Patient has been hydrated and this has now resolved.  Fecal impaction  Patient has been put on daily MiraLAX. His bowels are moving regularly.  Normocytic anemia  Mild and likely secondary to anemia of acute illness.  Wounds, multiple  The patient has been seen by the wound care RN. He has multiple pressure ulcers. His right hip has unstageable wounds. His right back is unstageable wounds. He also has wounds to the left inner knee, right outer knee, right knee, and left hip. Wound care recommendations were noted. Patient will continue on Santyl for chemical debridement of nonviable tissue and foam dressings to help with pressure reduction. The patient has been seen by general surgery for evaluation of the need for debridement of his wounds. Dr. Abbey Chatters debrided his right hip wound at the bedside on 07/29/11. He recommended hydrotherapy for the other wounds, which was done.  Hyperglycemia  Patient's fasting blood glucoses have been elevated. He does not have a known history of diabetes. We'll check a hemoglobin A1c.  Recommendations for hospital follow-up: 1.  F/U with Dr. Sandria Manly in 2 weeks.  Diet:  General  Activity:  Increase activity slowly  Condition at Discharge:   Improved  Time spent on Discharge:  40 minutes  Signed: Dr. Trula Ore Namine Beahm Pager [phone] 08/01/2011, 10:52 AM

## 2011-08-01 NOTE — Progress Notes (Signed)
Physical Therapy Wound Treatment Patient Details  Name: [name] Mccoy MRN: [id number] Date of Birth: 11/20/1960  Today's Date: 08/01/2011 Time:  - 1478-2956    Subjective     Pain Score: Pain Score: 0-No pain  Wound Assessment  Pressure Ulcer 07/26/11 Stage III -  Full thickness tissue loss. Subcutaneous fat may be visible but bone, tendon or muscle are NOT exposed. measuring 7.5 width by 9.5 (Active)  State of Healing Other (Comment) 08/01/2011 11:00 AM  Site / Wound Assessment Yellow;Other (Comment);Black;Granulation tissue;Pale 08/01/2011 11:00 AM  % Wound base Red or Granulating 0% 08/01/2011 11:00 AM  % Wound base Yellow 20% 08/01/2011 11:00 AM  % Wound base Black 40% 08/01/2011 11:00 AM  % Wound base Other (Comment) 40% 08/01/2011 11:00 AM  Peri-wound Assessment Intact 08/01/2011 11:00 AM  Tunneling (cm) no 07/29/2011 10:30 AM  Undermining (cm) no 07/29/2011 10:30 AM  Margins Unattacted edges (unapproximated) 08/01/2011 11:00 AM  Drainage Amount Minimal 08/01/2011 11:00 AM  Drainage Description Serosanguineous 08/01/2011 11:00 AM  Treatment Cleansed;Hydrotherapy (Pulse lavage) 08/01/2011 11:00 AM  Dressing Type Moist to dry;Silicone dressing;Other (Comment);Transparent dressing 08/01/2011 11:00 AM  Dressing Changed 08/01/2011 11:00 AM     Pressure Ulcer 07/26/11 Stage III -  Full thickness tissue loss. Subcutaneous fat may be visible but bone, tendon or muscle are NOT exposed. measuring 7.0 x 4 (Active)  State of Healing Other (Comment) 08/01/2011 11:00 AM  Site / Wound Assessment Black;Brown;Yellow;Other (Comment) 08/01/2011 11:00 AM  % Wound base Red or Granulating 10% 08/01/2011 11:00 AM  % Wound base Yellow 60% 08/01/2011 11:00 AM  % Wound base Black 30% 08/01/2011 11:00 AM  Peri-wound Assessment Intact 08/01/2011 11:00 AM  Tunneling (cm) no 07/29/2011 10:30 AM  Undermining (cm) no 07/29/2011 10:30 AM  Margins Unattacted edges (unapproximated) 08/01/2011 11:00 AM  Drainage Amount Minimal  08/01/2011 11:00 AM  Drainage Description Serosanguineous 08/01/2011 11:00 AM  Treatment Cleansed;Hydrotherapy (Pulse lavage) 08/01/2011 11:00 AM  Dressing Type Transparent dressing;Moist to dry;Silicone dressing;Other (Comment);Gauze (Comment) 08/01/2011 11:00 AM  Dressing Changed 08/01/2011 11:00 AM     Pressure Ulcer 07/26/11 Stage II -  Partial thickness loss of dermis presenting as a shallow open ulcer with a red, pink wound bed without slough. measuring 23 x 6 (Active)  State of Healing Early/partial granulation 08/01/2011 11:00 AM  Site / Wound Assessment Black;Pale;Red;Granulation tissue 08/01/2011 11:00 AM  % Wound base Red or Granulating 40% 08/01/2011 11:00 AM  % Wound base Yellow 10% 08/01/2011 11:00 AM  % Wound base Black 50% 08/01/2011 11:00 AM  Peri-wound Assessment Denuded;Other (Comment) 07/31/2011  8:15 PM  Tunneling (cm) no 07/29/2011 10:30 AM  Undermining (cm) no 07/29/2011 10:30 AM  Margins Unattacted edges (unapproximated) 08/01/2011 11:00 AM  Drainage Amount Minimal 08/01/2011 11:00 AM  Drainage Description Serosanguineous 08/01/2011 11:00 AM  Treatment Cleansed;Hydrotherapy (Pulse lavage) 08/01/2011 11:00 AM  Dressing Type Gauze (Comment);Moist to dry;Transparent dressing;Silicone dressing 08/01/2011 11:00 AM  Dressing Changed 08/01/2011 11:00 AM     Pressure Ulcer 07/26/11 Stage II -  Partial thickness loss of dermis presenting as a shallow open ulcer with a red, pink wound bed without slough. measuring 2 x2 (Active)  Dressing Type Silicone dressing 07/29/2011  7:26 AM  Dressing Clean;Dry;Intact 07/31/2011  8:15 PM     Pressure Ulcer 07/26/11 Stage II -  Partial thickness loss of dermis presenting as a shallow open ulcer with a red, pink wound bed without slough. measuring 1 x 1 (Active)  Dressing Type Silicone dressing 07/27/2011  9:54  AM  Dressing Clean;Dry;Intact 07/31/2011  8:15 PM     Pressure Ulcer 07/26/11 Stage II -  Partial thickness loss of dermis presenting as a shallow  open ulcer with a red, pink wound bed without slough. healing (Active)  Dressing Type Silicone dressing 07/27/2011  9:54 AM  Dressing Clean;Dry;Intact 07/31/2011  8:15 PM   Hydrotherapy Pulsed lavage therapy - wound location: right back, two area with thick black eschar on right hip Pulsed Lavage with Suction (psi): 12 psi Pulsed Lavage with Suction - Normal Saline Used: 2000 mL Pulsed Lavage Tip: Tip with splash shield Selective Debridement Selective Debridement - Location: small area on right back, and two areas on right hip Selective Debridement - Tools Used: Forceps;Scissors Selective Debridement - Tissue Removed: loose black eschar   Wound Assessment and Plan   Noted granulation buds are becoming prominent through slough in multiple wound sites and eschar is thinning on right back wound.  Also noted small area of eschar removal and dressed with tegaderm to allow healing to continue. Continue with current treatment.      Wound Therapy Goals- Improve the function of patient's integumentary system by progressing the wound(s) through the phases of wound healing (inflammation - proliferation - remodeling) by:    Goals will be updated until maximal potential achieved or discharge criteria met.  Discharge criteria: when goals achieved, discharge from hospital, MD decision/surgical intervention, no progress towards goals, refusal/missing three consecutive treatments without notification or medical reason.  Page, Meribeth Mattes 08/01/2011, 11:44 AM

## 2011-08-02 DIAGNOSIS — D473 Essential (hemorrhagic) thrombocythemia: Secondary | ICD-10-CM | POA: Diagnosis present

## 2011-08-02 LAB — CREATININE, SERUM
Creatinine, Ser: 0.59 mg/dL (ref 0.50–1.35)
GFR calc Af Amer: >90 mL/min (ref >90)

## 2011-08-02 LAB — CBC
HCT: 31.6 % — ABNORMAL LOW (ref 39.0–52.0)
MCHC: 32.6 g/dL (ref 30.0–36.0)
MCV: 81 fL (ref 78.0–100.0)
Platelets: 663 10*3/uL — ABNORMAL HIGH (ref 150–400)
RDW: 14.3 % (ref 11.5–15.5)

## 2011-08-02 NOTE — Progress Notes (Signed)
Physical Therapy Wound Treatment Patient Details  Name: [name] Mccoy MRN: [id number] Date of Birth: Jun 09, 1960  Today's Date: 08/02/2011 Time: 7846-9629 Time Calculation (min): 55 min  Subjective     Pain Score: Pain Score:   5 (hips)  Wound Assessment  Pressure Ulcer 07/26/11 Stage III -  Full thickness tissue loss. Subcutaneous fat may be visible but bone, tendon or muscle are NOT exposed. measuring 7.5 width by 9.5 (Active)  State of Healing Other (Comment) 08/01/2011 11:00 AM  Site / Wound Assessment Yellow;Other (Comment);Black;Granulation tissue;Pale 08/01/2011 11:00 AM  % Wound base Red or Granulating 0% 08/01/2011 11:00 AM  % Wound base Yellow 20% 08/01/2011 11:00 AM  % Wound base Black 40% 08/01/2011 11:00 AM  % Wound base Other (Comment) 40% 08/01/2011 11:00 AM  Peri-wound Assessment Intact 08/01/2011 11:00 AM  Tunneling (cm) no 07/29/2011 10:30 AM  Undermining (cm) no 07/29/2011 10:30 AM  Margins Unattacted edges (unapproximated) 08/01/2011 11:00 AM  Drainage Amount Minimal 08/01/2011 11:00 AM  Drainage Description Serosanguineous 08/01/2011  9:35 PM  Treatment Cleansed;Hydrotherapy (Pulse lavage) 08/01/2011 11:00 AM  Dressing Type Moist to dry;Silicone dressing;Other (Comment) 08/01/2011  9:35 PM  Dressing Clean;Dry;Intact 08/01/2011  9:35 PM     Pressure Ulcer 07/26/11 Stage III -  Full thickness tissue loss. Subcutaneous fat may be visible but bone, tendon or muscle are NOT exposed. measuring 7.0 x 4 (Active)  State of Healing Other (Comment) 08/01/2011 11:00 AM  Site / Wound Assessment Black;Brown;Yellow;Other (Comment) 08/01/2011 11:00 AM  % Wound base Red or Granulating 10% 08/01/2011 11:00 AM  % Wound base Yellow 60% 08/01/2011 11:00 AM  % Wound base Black 30% 08/01/2011 11:00 AM  Peri-wound Assessment Intact 08/01/2011 11:00 AM  Tunneling (cm) no 07/29/2011 10:30 AM  Undermining (cm) no 07/29/2011 10:30 AM  Margins Unattacted edges (unapproximated) 08/01/2011 11:00 AM  Drainage  Amount Minimal 08/01/2011 11:00 AM  Drainage Description Serosanguineous 08/01/2011 11:00 AM  Treatment Cleansed;Hydrotherapy (Pulse lavage) 08/01/2011 11:00 AM  Dressing Type Moist to dry;Transparent dressing;Silicone dressing 08/01/2011  9:35 PM  Dressing Clean;Dry;Intact 08/01/2011  9:35 PM     Pressure Ulcer 07/26/11 Stage II -  Partial thickness loss of dermis presenting as a shallow open ulcer with a red, pink wound bed without slough. measuring 23 x 6 (Active)  State of Healing Early/partial granulation 08/01/2011 11:00 AM  Site / Wound Assessment Black;Pale;Red;Granulation tissue 08/01/2011 11:00 AM  % Wound base Red or Granulating 40% 08/01/2011 11:00 AM  % Wound base Yellow 10% 08/01/2011 11:00 AM  % Wound base Black 50% 08/01/2011 11:00 AM  Peri-wound Assessment Denuded;Other (Comment) 07/31/2011  8:15 PM  Tunneling (cm) no 07/29/2011 10:30 AM  Undermining (cm) no 07/29/2011 10:30 AM  Margins Unattacted edges (unapproximated) 08/01/2011 11:00 AM  Drainage Amount Minimal 08/01/2011  9:35 PM  Drainage Description Serosanguineous 08/01/2011  9:35 PM  Treatment Cleansed;Hydrotherapy (Pulse lavage) 08/01/2011 11:00 AM  Dressing Type Gauze (Comment) 08/01/2011  9:35 PM  Dressing Clean;Dry;Intact 08/01/2011  9:35 PM     Pressure Ulcer 07/26/11 Stage II -  Partial thickness loss of dermis presenting as a shallow open ulcer with a red, pink wound bed without slough. measuring 2 x2 (Active)  Dressing Type Silicone dressing 07/29/2011  7:26 AM  Dressing Clean;Dry;Intact 08/01/2011  9:35 PM     Pressure Ulcer 07/26/11 Stage II -  Partial thickness loss of dermis presenting as a shallow open ulcer with a red, pink wound bed without slough. measuring 1 x 1 (Active)  Dressing Type  Silicone dressing 07/27/2011  9:54 AM  Dressing Clean;Dry;Intact 08/01/2011  9:35 PM     Pressure Ulcer 07/26/11 Stage II -  Partial thickness loss of dermis presenting as a shallow open ulcer with a red, pink wound bed without slough.  healing (Active)  Dressing Type Silicone dressing 07/27/2011  9:54 AM  Dressing Clean;Dry;Intact 08/01/2011  9:35 PM       Wound Assessment and Plan   Pt pre medicated for TX.  No c/o pain other than the cold saline spray on his back.  Pt on KIN AIR bed and positioned with pillows.  Pt plans to D/C to SNF. Wounds appear more soft and responding to TX.  Wound Therapy Goals- Improve the function of patient's integumentary system by progressing the wound(s) through the phases of wound healing (inflammation - proliferation - remodeling) by:    Goals will be updated until maximal potential achieved or discharge criteria met.  Discharge criteria: when goals achieved, discharge from hospital, MD decision/surgical intervention, no progress towards goals, refusal/missing three consecutive treatments without notification or medical reason.  Armando Reichert 08/02/2011, 1:14 PM

## 2011-08-02 NOTE — Progress Notes (Addendum)
ANTIBIOTIC CONSULT NOTE - FOLLOW-UP  Pharmacy Consult for Vancomycin/Zosyn Indication: Cellulitis  No Known Allergies  Patient Measurements: Height: 6\' 3"  (190.5 cm) Weight: 141 lb 12.1 oz (64.3 kg) IBW/kg (Calculated) : 84.5    Vital Signs: Temp: 98.7 F (37.1 C) (03/16 1405) Temp src: Oral (03/16 1405) BP: 95/64 mmHg (03/16 1405) Pulse Rate: 113  (03/16 1405) Intake/Output from previous day: 03/15 0701 - 03/16 0700 In: 3103 [P.O.:480; I.V.:1838; IV Piggyback:785] Out: 3075 [Urine:3075] Intake/Output from this shift: Total I/O In: 240 [P.O.:240] Out: 1100 [Urine:1100]  Labs:  Basename 08/02/11 0407 08/01/11 0355 07/31/11 0400  WBC 11.9* 11.9* 11.1*  HGB 10.3* 10.1* 10.5*  PLT 663* 602* 572*  LABCREA -- -- --  CREATININE 0.59 -- 0.60   Estimated Creatinine Clearance: 101.6 ml/min (by C-G formula based on Cr of 0.59). Normalized CrCl  >186ml/min/1.73m2  Microbiology: Recent Results (from the past 720 hour(s))  MRSA PCR SCREENING     Status: Normal   Collection Time   07/26/11  5:21 AM      Component Value Range Status Comment   MRSA by PCR NEGATIVE  NEGATIVE  Final   CULTURE, BLOOD (ROUTINE X 2)     Status: Normal (Preliminary result)   Collection Time   07/28/11  4:55 PM      Component Value Range Status Comment   Specimen Description BLOOD LEFT HAND   Final    Special Requests BOTTLES DRAWN AEROBIC ONLY 3CC   Final    Culture  Setup Time [id number]   Final    Culture     Final    Value:        BLOOD CULTURE RECEIVED NO GROWTH TO DATE CULTURE WILL BE HELD FOR 5 DAYS BEFORE ISSUING A FINAL NEGATIVE REPORT   Report Status PENDING   Incomplete   CULTURE, BLOOD (ROUTINE X 2)     Status: Normal (Preliminary result)   Collection Time   07/28/11  5:00 PM      Component Value Range Status Comment   Specimen Description BLOOD LEFT HAND   Final    Special Requests BOTTLES DRAWN AEROBIC ONLY 3CC   Final    Culture  Setup Time [id number]   Final    Culture      Final    Value:        BLOOD CULTURE RECEIVED NO GROWTH TO DATE CULTURE WILL BE HELD FOR 5 DAYS BEFORE ISSUING A FINAL NEGATIVE REPORT   Report Status PENDING   Incomplete   URINE CULTURE     Status: Normal   Collection Time   07/28/11  6:43 PM      Component Value Range Status Comment   Specimen Description URINE, CATHETERIZED   Final    Special Requests NONE   Final    Culture  Setup Time [id number]   Final    Colony Count NO GROWTH   Final    Culture NO GROWTH   Final    Report Status 07/29/2011 FINAL   Final   CULTURE, BLOOD (ROUTINE X 2)     Status: Normal (Preliminary result)   Collection Time   07/30/11 10:45 PM      Component Value Range Status Comment   Specimen Description BLOOD RIGHT HAND   Final    Special Requests BOTTLES DRAWN AEROBIC AND ANAEROBIC 2.5 CC EA   Final    Culture  Setup Time [id number]   Final    Culture     Final  Value:        BLOOD CULTURE RECEIVED NO GROWTH TO DATE CULTURE WILL BE HELD FOR 5 DAYS BEFORE ISSUING A FINAL NEGATIVE REPORT   Report Status PENDING   Incomplete   CULTURE, BLOOD (ROUTINE X 2)     Status: Normal (Preliminary result)   Collection Time   07/30/11 10:48 PM      Component Value Range Status Comment   Specimen Description BLOOD LEFT HAND   Final    Special Requests BOTTLES DRAWN AEROBIC AND ANAEROBIC 3 CC EA   Final    Culture  Setup Time [id number]   Final    Culture     Final    Value:        BLOOD CULTURE RECEIVED NO GROWTH TO DATE CULTURE WILL BE HELD FOR 5 DAYS BEFORE ISSUING A FINAL NEGATIVE REPORT   Report Status PENDING   Incomplete     Medical History: Past Medical History  Diagnosis Date  . Multiple sclerosis     Medications:  Anti-infectives     Start     Dose/Rate Route Frequency Ordered Stop   08/01/11 0000   ciprofloxacin (CIPRO) 500 MG tablet        500 mg Oral 2 times daily 08/01/11 1051 08/11/11 2359   08/01/11 0000   doxycycline (VIBRA-TABS) 100 MG tablet        100 mg Oral 2 times daily  08/01/11 1051 08/11/11 2359   07/28/11 1800   vancomycin (VANCOCIN) IVPB 1000 mg/200 mL premix        1,000 mg 200 mL/hr over 60 Minutes Intravenous Every 8 hours 07/28/11 1647     07/28/11 1800  piperacillin-tazobactam (ZOSYN) IVPB 3.375 g       3.375 g 12.5 mL/hr over 240 Minutes Intravenous Every 8 hours 07/28/11 1647     07/26/11 1600   Levofloxacin (LEVAQUIN) IVPB 750 mg  Status:  Discontinued        750 mg 100 mL/hr over 90 Minutes Intravenous Every 24 hours 07/26/11 1520 07/28/11 1625   07/26/11 0015   clindamycin (CLEOCIN) IVPB 900 mg        900 mg 100 mL/hr over 30 Minutes Intravenous  Once 07/26/11 0009 07/26/11 0105         Assessment:  51 YOM w/ hx MS admitted 07/26/11 after fall, down for 5 days  Day 6 Vanc/Zosyn per pharmacy for cellulitis/persistent fevers  3/11 CT:" No evidence of subcutaneous abscess. Stranding in the subcutaneous fat of the buttock is noted which may represent cellulitis. No convincing evidence of a destructive bony process to suggest osteomyelitis."  3/15 Surgery MD note indicates no sign of infection of the wounds.  Continues to be febrile (Tm=103.1 in last 24 hours)  All Cx remain NGTD  Vancomycin trough level therapeutic (13.2 mcg/ml) on 3/13 for cellulitis indication, however, due to persistent fever, will increase Vanco trough goal to 15-20 empirically.  Goal of Therapy:  Vanco Trough 15-20 (empiric)  Plan:   Continue Zosyn 3.375G IV q8h, each dose over 4 hours  Continue Vancomycin 1g IV q8h  Vanco trough tomorrow at [employer], PharmD Pager: [phone] 08/02/2011 4:26 PM

## 2011-08-02 NOTE — Progress Notes (Signed)
PROGRESS NOTE  [name] Mccoy:[id number] DOB: 1960/05/26 DOA: 07/25/2011 PCP: No primary provider on file.  Brief narrative: 51 year old man with a PMH of multiple sclerosis who presented to the ER with a history of a fall approximately 5 days prior to admission, which resulted in prolonged immobility leading to dehydration and rhabdomyolysis.  He was also noted to be febrile and have cellulitis.  He initially improved but then developed recurrent fever. There is concern for the possibility of abscess of the right hip. CT scan however was unremarkable. General surgery seen the patient in consultation and debridement is being contemplated. Patient continues on broad-spectrum antibiotic therapy. The patient was discharged to a skilled nursing facility on 08/01/2011, but due to insurance issues, facility we'll not be able to take him until 08/04/2011.  Assessment/Plan: Principal Problem:  *Rhabdomyolysis secondary to fall The patient was admitted and put on IV fluids for rehydration. His CK levels have gradually come down. His renal function remained stable. Active Problems:  Multiple sclerosis Patient has limited mobility due to his history of multiple sclerosis. Plan is to discharge him to a skilled nursing facility on 08/04/2011.  Dehydration Patient has been hydrated and this has now resolved.  Fecal impaction Patient has been put on daily MiraLAX. His bowels are moving regularly.  Normocytic anemia Mild and likely secondary to anemia of acute illness.  Wounds, multiple The patient has been seen by the wound care RN. He has multiple pressure ulcers. His right hip has unstageable wounds. His right back is unstageable wounds. He also has wounds to the left inner knee, right outer knee, right knee, and left hip. Wound care recommendations were noted. Patient will continue on Santyl for chemical debridement of nonviable tissue and foam dressings to help with pressure reduction. The patient has been  seen by general surgery for evaluation of the need for debridement of his wounds. Dr. Abbey Chatters performed a bedside debridement on 07/29/2011 and the patient has been treated with hydrotherapy to his other wounds. The patient remains on empiric vancomycin and Zosyn while in the hospital.  Hyperglycemia Patient's fasting blood glucoses have been elevated. He does not have a known history of diabetes. Hemoglobin A1c was checked and found to be 5.6%. We will discontinue CBG checks.  Thrombocytosis Likely reactive. We'll continue to monitor.  Code Status: Full Family Communication: Mccoy, [name] [phone] Disposition Plan: SNF placement  Medical Consultants:  Dr. Faith Rogue, Physical Medicine and Rehabilitation: Skilled nursing home placement recommended.  Dr. Avel Peace, Surgery: Plans to debride the right hip wound at the bedside; Hydrotherapy recommended for other wounds.  Other Consultants:  Physical therapy: Inpatient rehabilitation, skilled or LTACH   Occupational therapy: Skilled nursing facility   Wound care   Procedures:  Sharp debridement of right hip wound performed by Dr. Abbey Chatters on 07/29/2011  Antibiotics:  Clindamycin 07/26/11 ---> 07/26/11  Levaquin 07/26/11 ---> 07/28/11  Zosyn 07/28/11 --->  Vancomycin 07/28/11 --->   Subjective  [name] feels well. Appetite remains good. No uncontrolled pain. No nausea or vomiting. Bowels moving regularly.   Objective    Interim History: [name] was stable overnight.   Objective: Filed Vitals:   08/01/11 1528 08/01/11 2115 08/01/11 2230 08/02/11 0440  BP: 125/80 112/64  112/73  Pulse: 94 132  117  Temp: 98.3 F (36.8 C) 103.1 F (39.5 C) 99.7 F (37.6 C) 99.4 F (37.4 C)  TempSrc: Oral Oral Oral Oral  Resp: 18 19  18   Height:  Weight:      SpO2: 97% 95%  99%    Intake/Output Summary (Last 24 hours) at 08/02/11 1019 Last data filed at 08/02/11 0954  Gross per 24 hour  Intake   3103 ml    Output   3075 ml  Net     28 ml    Exam: Gen:  NAD Cardiovascular:  RRR, No M/R/G Respiratory: Lungs CTAB Gastrointestinal: Abdomen soft, NT/ND with normal active bowel sounds. Extremities: No C/E/C   Data Reviewed: Basic Metabolic Panel:  Lab 08/02/11 1610 07/31/11 0400 07/29/11 0400 07/27/11 0422  NA -- 136 136 134*  K -- 4.3 3.6 --  CL -- 102 104 105  CO2 -- 26 26 25   GLUCOSE -- 112* 135* 107*  BUN -- 9 6 16   CREATININE 0.59 0.60 0.52 0.54  CALCIUM -- 8.1* 8.0* 7.6*  MG -- -- -- --  PHOS -- -- -- --   CBC:  Lab 08/02/11 0407 08/01/11 0355 07/31/11 0400 07/30/11 0355 07/29/11 0400  WBC 11.9* 11.9* 11.1* 12.4* 9.6  NEUTROABS -- -- -- -- --  HGB 10.3* 10.1* 10.5* 11.1* 10.8*  HCT 31.6* 30.7* 32.9* 34.8* 33.3*  MCV 81.0 81.6 82.5 83.3 82.8  PLT 663* 602* 572* 529* 375   Cardiac Enzymes:  Lab 07/27/11 0422  CKTOTAL 587*  CKMB 3.2  CKMBINDEX --  TROPONINI --     Recent Results (from the past 240 hour(s))  MRSA PCR SCREENING     Status: Normal   Collection Time   07/26/11  5:21 AM      Component Value Range Status Comment   MRSA by PCR NEGATIVE  NEGATIVE  Final   CULTURE, BLOOD (ROUTINE X 2)     Status: Normal (Preliminary result)   Collection Time   07/28/11  4:55 PM      Component Value Range Status Comment   Specimen Description BLOOD LEFT HAND   Final    Special Requests BOTTLES DRAWN AEROBIC ONLY 3CC   Final    Culture  Setup Time [id number]   Final    Culture     Final    Value:        BLOOD CULTURE RECEIVED NO GROWTH TO DATE CULTURE WILL BE HELD FOR 5 DAYS BEFORE ISSUING A FINAL NEGATIVE REPORT   Report Status PENDING   Incomplete   CULTURE, BLOOD (ROUTINE X 2)     Status: Normal (Preliminary result)   Collection Time   07/28/11  5:00 PM      Component Value Range Status Comment   Specimen Description BLOOD LEFT HAND   Final    Special Requests BOTTLES DRAWN AEROBIC ONLY 3CC   Final    Culture  Setup Time [id number]   Final    Culture      Final    Value:        BLOOD CULTURE RECEIVED NO GROWTH TO DATE CULTURE WILL BE HELD FOR 5 DAYS BEFORE ISSUING A FINAL NEGATIVE REPORT   Report Status PENDING   Incomplete   URINE CULTURE     Status: Normal   Collection Time   07/28/11  6:43 PM      Component Value Range Status Comment   Specimen Description URINE, CATHETERIZED   Final    Special Requests NONE   Final    Culture  Setup Time [id number]   Final    Colony Count NO GROWTH   Final    Culture NO GROWTH   Final  Report Status 07/29/2011 FINAL   Final   CULTURE, BLOOD (ROUTINE X 2)     Status: Normal (Preliminary result)   Collection Time   07/30/11 10:45 PM      Component Value Range Status Comment   Specimen Description BLOOD RIGHT HAND   Final    Special Requests BOTTLES DRAWN AEROBIC AND ANAEROBIC 2.5 CC EA   Final    Culture  Setup Time [id number]   Final    Culture     Final    Value:        BLOOD CULTURE RECEIVED NO GROWTH TO DATE CULTURE WILL BE HELD FOR 5 DAYS BEFORE ISSUING A FINAL NEGATIVE REPORT   Report Status PENDING   Incomplete   CULTURE, BLOOD (ROUTINE X 2)     Status: Normal (Preliminary result)   Collection Time   07/30/11 10:48 PM      Component Value Range Status Comment   Specimen Description BLOOD LEFT HAND   Final    Special Requests BOTTLES DRAWN AEROBIC AND ANAEROBIC 3 CC EA   Final    Culture  Setup Time [id number]   Final    Culture     Final    Value:        BLOOD CULTURE RECEIVED NO GROWTH TO DATE CULTURE WILL BE HELD FOR 5 DAYS BEFORE ISSUING A FINAL NEGATIVE REPORT   Report Status PENDING   Incomplete      Studies:  Ct Pelvis W Contrast 07/29/2011   IMPRESSION: Cholelithiasis is suspected.  Fecal impaction in the rectum is noted.  The rectal wall is edematous and thickened.  No evidence of subcutaneous abscess.  Stranding in the subcutaneous fat of the buttock is noted which may represent cellulitis.  No convincing evidence of a destructive bony process to suggest osteomyelitis.   Scrotal hydrocele is suspected.  Original Report Authenticated By: Donavan Burnet, M.D.    Scheduled Meds:    . amantadine  100 mg Oral BID  . calcium carbonate  1 tablet Oral Daily  . collagenase   Topical Daily  . dantrolene  25 mg Oral TID  . enoxaparin  40 mg Subcutaneous Q24H  . gabapentin  600 mg Oral QID  . interferon beta-1b  0.25 mg Subcutaneous QODAY  . lubriderm seriously sensitive   Topical BID  . modafinil  200 mg Oral Daily  . piperacillin-tazobactam (ZOSYN)  IV  3.375 g Intravenous Q8H  . polyethylene glycol  17 g Oral Daily  . vancomycin  1,000 mg Intravenous Q8H   Continuous Infusions:    . sodium chloride 75 mL/hr at 07/31/11 1550      LOS: 8 days   Hillery Aldo, MD Pager [phone]  08/02/2011, 10:19 AM

## 2011-08-03 LAB — VANCOMYCIN, TROUGH: Vancomycin Tr: 36.6 ug/mL (ref 10.0–20.0)

## 2011-08-03 MED ORDER — VANCOMYCIN HCL 1000 MG IV SOLR
750.0000 mg | Freq: Two times a day (BID) | INTRAVENOUS | Status: DC
  Administered 2011-08-04 (×2): 750 mg via INTRAVENOUS
  Filled 2011-08-03 (×3): qty 750

## 2011-08-03 NOTE — Progress Notes (Signed)
PROGRESS NOTE  [name] Mccoy JXB:[id number] DOB: [birth year] DOA: 07/25/2011 PCP: No primary provider on file.  Brief narrative: 51 year old man with a PMH of multiple sclerosis who presented to the ER with a history of a fall approximately 5 days prior to admission, which resulted in prolonged immobility leading to dehydration and rhabdomyolysis.  He was also noted to be febrile and have cellulitis.  He initially improved but then developed recurrent fever. There is concern for the possibility of abscess of the right hip. CT scan however was unremarkable. General surgery seen the patient in consultation and debridement is being contemplated. Patient continues on broad-spectrum antibiotic therapy. The patient was discharged to a skilled nursing facility on 08/01/2011, but due to insurance issues, facility will not be able to take him until 08/04/2011.  Assessment/Plan: Principal Problem:  *Rhabdomyolysis secondary to fall The patient was admitted and put on IV fluids for rehydration. His CK levels have gradually come down. His renal function remained stable. Active Problems:  Multiple sclerosis Patient has limited mobility due to his history of multiple sclerosis. Plan is to discharge him to a skilled nursing facility on 08/04/2011.  Dehydration Patient has been hydrated and this has now resolved.  Fecal impaction Patient has been put on daily MiraLAX. His bowels are moving regularly.  Normocytic anemia Mild and likely secondary to anemia of acute illness.  Wounds, multiple The patient has been seen by the wound care RN. He has multiple pressure ulcers. His right hip has unstageable wounds. His right back is unstageable wounds. He also has wounds to the left inner knee, right outer knee, right knee, and left hip. Wound care recommendations were noted. Patient will continue on Santyl for chemical debridement of nonviable tissue and foam dressings to help with pressure reduction. The patient has been  seen by general surgery for evaluation of the need for debridement of his wounds. Dr. Abbey Chatters performed a bedside debridement on 07/29/2011 and the patient has been treated with hydrotherapy to his other wounds. The patient remains on empiric vancomycin and Zosyn while in the hospital.  Hyperglycemia Patient's fasting blood glucoses have been elevated. He does not have a known history of diabetes. Hemoglobin A1c was checked and found to be 5.6%. We will discontinue CBG checks.  Thrombocytosis Likely reactive. We'll continue to monitor.  Code Status: Full Family Communication: KAIZER, DISSINGER [phone] Disposition Plan: SNF placement  Medical Consultants:  Dr. Faith Rogue, Physical Medicine and Rehabilitation: Skilled nursing home placement recommended.  Dr. Avel Peace, Surgery: Plans to debride the right hip wound at the bedside; Hydrotherapy recommended for other wounds.  Other Consultants:  Physical therapy: Inpatient rehabilitation, skilled or LTACH   Occupational therapy: Skilled nursing facility   Wound care   Procedures:  Sharp debridement of right hip wound performed by Dr. Abbey Chatters on 07/29/2011  Antibiotics:  Clindamycin 07/26/11 ---> 07/26/11  Levaquin 07/26/11 ---> 07/28/11  Zosyn 07/28/11 --->  Vancomycin 07/28/11 --->   Subjective  Mr. Seiden continues to feel well. Appetite remains good. No uncontrolled pain. No nausea or vomiting. Bowels moving regularly.   Objective    Interim History: Mr. Gassett was stable overnight. Still spiking low-grade fevers.   Objective: Filed Vitals:   08/02/11 0440 08/02/11 1405 08/02/11 2145 08/03/11 0540  BP: 112/73 95/64 107/72 105/73  Pulse: 117 113 118 112  Temp: 99.4 F (37.4 C) 98.7 F (37.1 C) 100.8 F (38.2 C) 99.4 F (37.4 C)  TempSrc: Oral Oral Oral Oral  Resp: 18 18 18  18  Height:      Weight:      SpO2: 99% 96% 98% 98%    Intake/Output Summary (Last 24 hours) at 08/03/11 0748 Last data  filed at 08/03/11 4098  Gross per 24 hour  Intake   1230 ml  Output   2600 ml  Net  -1370 ml    Exam: Gen:  NAD Cardiovascular:  RRR, No M/R/G Respiratory: Lungs CTAB Gastrointestinal: Abdomen soft, NT/ND with normal active bowel sounds. Extremities: No C/E/C   Data Reviewed: Basic Metabolic Panel:  Lab 08/02/11 1191 07/31/11 0400 07/29/11 0400  NA -- 136 136  K -- 4.3 3.6  CL -- 102 104  CO2 -- 26 26  GLUCOSE -- 112* 135*  BUN -- 9 6  CREATININE 0.59 0.60 0.52  CALCIUM -- 8.1* 8.0*  MG -- -- --  PHOS -- -- --   CBC:  Lab 08/02/11 0407 08/01/11 0355 07/31/11 0400 07/30/11 0355 07/29/11 0400  WBC 11.9* 11.9* 11.1* 12.4* 9.6  NEUTROABS -- -- -- -- --  HGB 10.3* 10.1* 10.5* 11.1* 10.8*  HCT 31.6* 30.7* 32.9* 34.8* 33.3*  MCV 81.0 81.6 82.5 83.3 82.8  PLT 663* 602* 572* 529* 375     Recent Results (from the past 240 hour(s))  MRSA PCR SCREENING     Status: Normal   Collection Time   07/26/11  5:21 AM      Component Value Range Status Comment   MRSA by PCR NEGATIVE  NEGATIVE  Final   CULTURE, BLOOD (ROUTINE X 2)     Status: Normal (Preliminary result)   Collection Time   07/28/11  4:55 PM      Component Value Range Status Comment   Specimen Description BLOOD LEFT HAND   Final    Special Requests BOTTLES DRAWN AEROBIC ONLY 3CC   Final    Culture  Setup Time [id number]   Final    Culture     Final    Value:        BLOOD CULTURE RECEIVED NO GROWTH TO DATE CULTURE WILL BE HELD FOR 5 DAYS BEFORE ISSUING A FINAL NEGATIVE REPORT   Report Status PENDING   Incomplete   CULTURE, BLOOD (ROUTINE X 2)     Status: Normal (Preliminary result)   Collection Time   07/28/11  5:00 PM      Component Value Range Status Comment   Specimen Description BLOOD LEFT HAND   Final    Special Requests BOTTLES DRAWN AEROBIC ONLY 3CC   Final    Culture  Setup Time [id number]   Final    Culture     Final    Value:        BLOOD CULTURE RECEIVED NO GROWTH TO DATE CULTURE WILL BE HELD FOR 5  DAYS BEFORE ISSUING A FINAL NEGATIVE REPORT   Report Status PENDING   Incomplete   URINE CULTURE     Status: Normal   Collection Time   07/28/11  6:43 PM      Component Value Range Status Comment   Specimen Description URINE, CATHETERIZED   Final    Special Requests NONE   Final    Culture  Setup Time [id number]   Final    Colony Count NO GROWTH   Final    Culture NO GROWTH   Final    Report Status 07/29/2011 FINAL   Final   CULTURE, BLOOD (ROUTINE X 2)     Status: Normal (Preliminary result)   Collection Time  07/30/11 10:45 PM      Component Value Range Status Comment   Specimen Description BLOOD RIGHT HAND   Final    Special Requests BOTTLES DRAWN AEROBIC AND ANAEROBIC 2.5 CC EA   Final    Culture  Setup Time [id number]   Final    Culture     Final    Value:        BLOOD CULTURE RECEIVED NO GROWTH TO DATE CULTURE WILL BE HELD FOR 5 DAYS BEFORE ISSUING A FINAL NEGATIVE REPORT   Report Status PENDING   Incomplete   CULTURE, BLOOD (ROUTINE X 2)     Status: Normal (Preliminary result)   Collection Time   07/30/11 10:48 PM      Component Value Range Status Comment   Specimen Description BLOOD LEFT HAND   Final    Special Requests BOTTLES DRAWN AEROBIC AND ANAEROBIC 3 CC EA   Final    Culture  Setup Time [id number]   Final    Culture     Final    Value:        BLOOD CULTURE RECEIVED NO GROWTH TO DATE CULTURE WILL BE HELD FOR 5 DAYS BEFORE ISSUING A FINAL NEGATIVE REPORT   Report Status PENDING   Incomplete      Studies:  Ct Pelvis W Contrast 07/29/2011   IMPRESSION: Cholelithiasis is suspected.  Fecal impaction in the rectum is noted.  The rectal wall is edematous and thickened.  No evidence of subcutaneous abscess.  Stranding in the subcutaneous fat of the buttock is noted which may represent cellulitis.  No convincing evidence of a destructive bony process to suggest osteomyelitis.  Scrotal hydrocele is suspected.  Original Report Authenticated By: Donavan Burnet, M.D.     Scheduled Meds:    . amantadine  100 mg Oral BID  . calcium carbonate  1 tablet Oral Daily  . collagenase   Topical Daily  . dantrolene  25 mg Oral TID  . enoxaparin  40 mg Subcutaneous Q24H  . gabapentin  600 mg Oral QID  . interferon beta-1b  0.25 mg Subcutaneous QODAY  . lubriderm seriously sensitive   Topical BID  . modafinil  200 mg Oral Daily  . piperacillin-tazobactam (ZOSYN)  IV  3.375 g Intravenous Q8H  . polyethylene glycol  17 g Oral Daily  . vancomycin  1,000 mg Intravenous Q8H   Continuous Infusions:    . DISCONTD: sodium chloride 75 mL/hr at 07/31/11 1550      LOS: 9 days   Hillery Aldo, MD Pager [phone]  08/03/2011, 7:48 AM

## 2011-08-03 NOTE — Progress Notes (Signed)
ANTIBIOTIC CONSULT NOTE - FOLLOW UP  Pharmacy Consult for Vancomycin Indication: Cellulitis/recurrent fever  No Known Allergies  Patient Measurements: Height: 6\' 3"  (190.5 cm) Weight: 141 lb 12.1 oz (64.3 kg) IBW/kg (Calculated) : 84.5    Vital Signs: Temp: 99.4 F (37.4 C) (03/17 0540) Temp src: Oral (03/17 0540) BP: 105/73 mmHg (03/17 0540) Pulse Rate: 112  (03/17 0540) Intake/Output from previous day: 03/16 0701 - 03/17 0700 In: 1230 [P.O.:480; IV Piggyback:750] Out: 2600 [Urine:2600] Intake/Output from this shift:    Labs:  [facility] 08/02/11 0407 08/01/11 0355  WBC 11.9* 11.9*  HGB 10.3* 10.1*  PLT 663* 602*  LABCREA -- --  CREATININE 0.59 --   Estimated Creatinine Clearance: 101.6 ml/min (by C-G formula based on Cr of 0.59).  Basename 08/03/11 0948  VANCOTROUGH 36.6*  VANCOPEAK --  Drue Dun --  GENTTROUGH --  GENTPEAK --  GENTRANDOM --  TOBRATROUGH --  Nolen Mu --  TOBRARND --  AMIKACINPEAK --  AMIKACINTROU --  AMIKACIN --     Microbiology: Recent Results (from the past 720 hour(s))  MRSA PCR SCREENING     Status: Normal   Collection Time   07/26/11  5:21 AM      Component Value Range Status Comment   MRSA by PCR NEGATIVE  NEGATIVE  Final   CULTURE, BLOOD (ROUTINE X 2)     Status: Normal (Preliminary result)   Collection Time   07/28/11  4:55 PM      Component Value Range Status Comment   Specimen Description BLOOD LEFT HAND   Final    Special Requests BOTTLES DRAWN AEROBIC ONLY 3CC   Final    Culture  Setup Time [id number]   Final    Culture     Final    Value:        BLOOD CULTURE RECEIVED NO GROWTH TO DATE CULTURE WILL BE HELD FOR 5 DAYS BEFORE ISSUING A FINAL NEGATIVE REPORT   Report Status PENDING   Incomplete   CULTURE, BLOOD (ROUTINE X 2)     Status: Normal (Preliminary result)   Collection Time   07/28/11  5:00 PM      Component Value Range Status Comment   Specimen Description BLOOD LEFT HAND   Final    Special Requests BOTTLES  DRAWN AEROBIC ONLY 3CC   Final    Culture  Setup Time [id number]   Final    Culture     Final    Value:        BLOOD CULTURE RECEIVED NO GROWTH TO DATE CULTURE WILL BE HELD FOR 5 DAYS BEFORE ISSUING A FINAL NEGATIVE REPORT   Report Status PENDING   Incomplete   URINE CULTURE     Status: Normal   Collection Time   07/28/11  6:43 PM      Component Value Range Status Comment   Specimen Description URINE, CATHETERIZED   Final    Special Requests NONE   Final    Culture  Setup Time [id number]   Final    Colony Count NO GROWTH   Final    Culture NO GROWTH   Final    Report Status 07/29/2011 FINAL   Final   CULTURE, BLOOD (ROUTINE X 2)     Status: Normal (Preliminary result)   Collection Time   07/30/11 10:45 PM      Component Value Range Status Comment   Specimen Description BLOOD RIGHT HAND   Final    Special Requests BOTTLES DRAWN AEROBIC AND ANAEROBIC  2.5 CC EA   Final    Culture  Setup Time [id number]   Final    Culture     Final    Value:        BLOOD CULTURE RECEIVED NO GROWTH TO DATE CULTURE WILL BE HELD FOR 5 DAYS BEFORE ISSUING A FINAL NEGATIVE REPORT   Report Status PENDING   Incomplete   CULTURE, BLOOD (ROUTINE X 2)     Status: Normal (Preliminary result)   Collection Time   07/30/11 10:48 PM      Component Value Range Status Comment   Specimen Description BLOOD LEFT HAND   Final    Special Requests BOTTLES DRAWN AEROBIC AND ANAEROBIC 3 CC EA   Final    Culture  Setup Time [id number]   Final    Culture     Final    Value:        BLOOD CULTURE RECEIVED NO GROWTH TO DATE CULTURE WILL BE HELD FOR 5 DAYS BEFORE ISSUING A FINAL NEGATIVE REPORT   Report Status PENDING   Incomplete   CULTURE, BLOOD (SINGLE)     Status: Normal (Preliminary result)   Collection Time   08/01/11 11:58 PM      Component Value Range Status Comment   Specimen Description BLOOD LEFT HAND   Final    Special Requests Normal BOTTLES DRAWN AEROBIC AND ANAEROBIC 3CC   Final    Culture  Setup Time  [id number]   Final    Culture     Final    Value:        BLOOD CULTURE RECEIVED NO GROWTH TO DATE CULTURE WILL BE HELD FOR 5 DAYS BEFORE ISSUING A FINAL NEGATIVE REPORT   Report Status PENDING   Incomplete     Anti-infectives     Start     Dose/Rate Route Frequency Ordered Stop   08/04/11 0300   vancomycin (VANCOCIN) 750 mg in sodium chloride 0.9 % 150 mL IVPB        750 mg 150 mL/hr over 60 Minutes Intravenous Every 12 hours 08/03/11 1251     08/01/11 0000   ciprofloxacin (CIPRO) 500 MG tablet        500 mg Oral 2 times daily 08/01/11 1051 08/11/11 2359   08/01/11 0000   doxycycline (VIBRA-TABS) 100 MG tablet        100 mg Oral 2 times daily 08/01/11 1051 08/11/11 2359   07/28/11 1800   vancomycin (VANCOCIN) IVPB 1000 mg/200 mL premix  Status:  Discontinued        1,000 mg 200 mL/hr over 60 Minutes Intravenous Every 8 hours 07/28/11 1647 08/03/11 1251   07/28/11 1800  piperacillin-tazobactam (ZOSYN) IVPB 3.375 g       3.375 g 12.5 mL/hr over 240 Minutes Intravenous Every 8 hours 07/28/11 1647     07/26/11 1600   Levofloxacin (LEVAQUIN) IVPB 750 mg  Status:  Discontinued        750 mg 100 mL/hr over 90 Minutes Intravenous Every 24 hours 07/26/11 1520 07/28/11 1625   07/26/11 0015   clindamycin (CLEOCIN) IVPB 900 mg        900 mg 100 mL/hr over 30 Minutes Intravenous  Once 07/26/11 0009 07/26/11 0105          Assessment: Vancomycin trough supratheraputic on vancomycin 1 gm q8h.   Goal of Therapy:  Vancomycin 15-20 (Cellulitis, however recurrent fever)  Plan:  1.) Decrease vancomycin dose to 750 mg IV q12. Next dose due  tonight at 0300.   Luanna Weesner, Loma Messing 08/03/2011,12:58 PM

## 2011-08-03 NOTE — Progress Notes (Signed)
Dr. Darnelle Catalan notified of critical Vancomycin trough level 36.6.

## 2011-08-04 LAB — CULTURE, BLOOD (ROUTINE X 2)
Culture  Setup Time: 201303120150
Culture: NO GROWTH

## 2011-08-04 MED ORDER — DOXYCYCLINE HYCLATE 100 MG PO TABS: 100.0000 mg | ORAL_TABLET | Freq: Two times a day (BID) | ORAL | Status: AC

## 2011-08-04 MED ORDER — CIPROFLOXACIN HCL 500 MG PO TABS: 500.0000 mg | ORAL_TABLET | Freq: Two times a day (BID) | ORAL | Status: AC

## 2011-08-04 NOTE — Progress Notes (Signed)
  Subjective: Has never had any pain.  Scheduled to go to SNF today.  Objective: Vital signs in last 24 hours: Temp:  [98.3 F (36.8 C)-102.9 F (39.4 C)] 98.3 F (36.8 C) (03/18 1506) Pulse Rate:  [107-129] 107  (03/18 1506) Resp:  [20-22] 20  (03/18 1506) BP: (94-100)/(65-68) 98/65 mmHg (03/18 1506) SpO2:  [95 %-100 %] 100 % (03/18 1506) Last BM Date: 08/03/11  Intake/Output from previous day: 03/17 0701 - 03/18 0700 In: 1340 [P.O.:840; IV Piggyback:500] Out: 1750 [Urine:1750] Intake/Output this shift: Total I/O In: 200 [IV Piggyback:200] Out: 0   General appearance: alert, appears stated age, cachectic and no distress Skin: Skin color, texture, turgor normal. No rashes or lesions or No siginificant change in open areas, still being treated with santyl and wet to dry dressing.  No ordor or increased discomfort  Lab Results:   Basename 08/02/11 0407  WBC 11.9*  HGB 10.3*  HCT 31.6*  PLT 663*    BMET  Basename 08/02/11 0407  NA --  K --  CL --  CO2 --  GLUCOSE --  BUN --  CREATININE 0.59  CALCIUM --   PT/INR No results found for this basename: LABPROT:2,INR:2 in the last 72 hours  No results found for this basename: AST:5,ALT:5,ALKPHOS:5,BILITOT:5,PROT:5,ALBUMIN:5 in the last 168 hours   Lipase  No results found for this basename: lipase     Studies/Results: No results found.  Medications:    . amantadine  100 mg Oral BID  . calcium carbonate  1 tablet Oral Daily  . collagenase   Topical Daily  . dantrolene  25 mg Oral TID  . enoxaparin  40 mg Subcutaneous Q24H  . gabapentin  600 mg Oral QID  . interferon beta-1b  0.25 mg Subcutaneous QODAY  . lubriderm seriously sensitive   Topical BID  . modafinil  200 mg Oral Daily  . piperacillin-tazobactam (ZOSYN)  IV  3.375 g Intravenous Q8H  . polyethylene glycol  17 g Oral Daily  . vancomycin  750 mg Intravenous Q12H    Assessment/Plan 1. Multiple sclerosis with marked debilitation.  2. Fall 2  weeks ago, he was hospitalized and sent home. Now presents with rhabdomyolysis after being on the floor for 72 hours or more at home.  3. Multiple wounds/ulcers of the skin/some skin necrosis as described above. S/P some debridement of Right hip eschar.07/29/11  4. Sepsis, tachycardia, fever, hypotension.  5. Dehydration  Plan:  Ongoing wet to dry dressing, PCM, and air mattress to help treat areas.  LOS: 10 days    Ellysia Char 08/04/2011

## 2011-08-04 NOTE — Progress Notes (Signed)
Physical Therapy Wound Treatment Patient Details  Name: [name] Mccoy MRN: [id number] Date of Birth: 17-May-1961  Today's Date: 08/04/2011 Time: [id number]   Subjective  Subjective: Pt states that his pain is well controlled for hydotherapy session.    Wound Assessment  Pressure Ulcer 07/26/11 Stage III -  Full thickness tissue loss. Subcutaneous fat may be visible but bone, tendon or muscle are NOT exposed. measuring 7.5 width by 9.5 (Active)  State of Healing Other (Comment) 08/04/2011  4:29 PM  Site / Wound Assessment Black;Granulation tissue;Red;Yellow;Pale;Other (Comment) 08/04/2011  4:29 PM  % Wound base Red or Granulating 5% 08/04/2011  4:29 PM  % Wound base Yellow 20% 08/04/2011  4:29 PM  % Wound base Black 35% 08/04/2011  4:29 PM  % Wound base Other (Comment) 40% 08/04/2011  4:29 PM  Peri-wound Assessment Intact 08/04/2011  4:29 PM  Tunneling (cm) no 07/29/2011 10:30 AM  Undermining (cm) no 07/29/2011 10:30 AM  Margins Unattacted edges (unapproximated) 08/04/2011  4:29 PM  Drainage Amount Minimal 08/04/2011  4:29 PM  Drainage Description Serosanguineous 08/04/2011  4:29 PM  Treatment Cleansed;Hydrotherapy (Pulse lavage) 08/04/2011  4:29 PM  Dressing Type Moist to dry;Gauze (Comment);Silicone dressing;Transparent dressing 08/04/2011  4:29 PM  Dressing Changed 08/04/2011  4:29 PM     Pressure Ulcer 07/26/11 Stage III -  Full thickness tissue loss. Subcutaneous fat may be visible but bone, tendon or muscle are NOT exposed. measuring 7.0 x 4 (Active)  State of Healing Other (Comment) 08/04/2011  4:29 PM  Site / Wound Assessment Black;Brown;Yellow;Other (Comment) 08/04/2011  4:29 PM  % Wound base Red or Granulating 10% 08/04/2011  4:29 PM  % Wound base Yellow 60% 08/04/2011  4:29 PM  % Wound base Black 30% 08/04/2011  4:29 PM  Peri-wound Assessment Intact 08/04/2011  4:29 PM  Tunneling (cm) no 07/29/2011 10:30 AM  Undermining (cm) no 07/29/2011 10:30 AM  Margins Unattacted edges (unapproximated)  08/04/2011  4:29 PM  Drainage Amount Minimal 08/04/2011  4:29 PM  Drainage Description Serosanguineous 08/04/2011  4:29 PM  Treatment Cleansed;Hydrotherapy (Pulse lavage) 08/04/2011  4:29 PM  Dressing Type Gauze (Comment);Moist to dry;Silicone dressing;Transparent dressing;Other (Comment) 08/04/2011  4:29 PM  Dressing Changed 08/04/2011  4:29 PM     Pressure Ulcer 07/26/11 Stage II -  Partial thickness loss of dermis presenting as a shallow open ulcer with a red, pink wound bed without slough. measuring 23 x 6 (Active)  State of Healing Early/partial granulation 08/04/2011  4:29 PM  Site / Wound Assessment Black;Granulation tissue;Pale 08/04/2011  4:29 PM  % Wound base Red or Granulating 40% 08/04/2011  4:29 PM  % Wound base Yellow 10% 08/04/2011  4:29 PM  % Wound base Black 50% 08/04/2011  4:29 PM  Peri-wound Assessment Denuded;Other (Comment) 07/31/2011  8:15 PM  Tunneling (cm) no 07/29/2011 10:30 AM  Undermining (cm) no 07/29/2011 10:30 AM  Margins Unattacted edges (unapproximated) 08/04/2011  4:29 PM  Drainage Amount Minimal 08/04/2011  4:29 PM  Drainage Description Serosanguineous 08/04/2011  4:29 PM  Treatment Cleansed;Hydrotherapy (Pulse lavage) 08/04/2011  4:29 PM  Dressing Type Moist to dry;Silicone dressing;Gauze (Comment) 08/04/2011  4:29 PM  Dressing Changed 08/04/2011  4:29 PM     Pressure Ulcer 07/26/11 Stage II -  Partial thickness loss of dermis presenting as a shallow open ulcer with a red, pink wound bed without slough. measuring 2 x2 (Active)  Dressing Type Silicone dressing 07/29/2011  7:26 AM  Dressing Clean;Dry;Intact 08/04/2011  8:44 AM     Pressure Ulcer  07/26/11 Stage II -  Partial thickness loss of dermis presenting as a shallow open ulcer with a red, pink wound bed without slough. measuring 1 x 1 (Active)  Dressing Type Silicone dressing 07/27/2011  9:54 AM  Dressing Clean;Dry;Intact 08/04/2011  8:44 AM     Pressure Ulcer 07/26/11 Stage II -  Partial thickness loss of dermis  presenting as a shallow open ulcer with a red, pink wound bed without slough. healing (Active)  Dressing Type Silicone dressing 07/27/2011  9:54 AM  Dressing Dry;Intact 08/04/2011  8:44 AM   Hydrotherapy Pulsed lavage therapy - wound location: right back, two area with thick black eschar on right hip Pulsed Lavage with Suction (psi): 12 psi Pulsed Lavage with Suction - Normal Saline Used: 2000 mL Pulsed Lavage Tip: Tip with splash shield Selective Debridement Selective Debridement - Location: small area on right back, and one area on right hip Selective Debridement - Tools Used: Forceps;Scissors Selective Debridement - Tissue Removed: loose black eschar   Wound Assessment and Plan   Pt continues to have increasing areas of granulation buds with continuing areas of loose eschar noted for debridement.  Continue with treatment plan.   Wound Therapy Goals- Improve the function of patient's integumentary system by progressing the wound(s) through the phases of wound healing (inflammation - proliferation - remodeling) by:    Goals will be updated until maximal potential achieved or discharge criteria met.  Discharge criteria: when goals achieved, discharge from hospital, MD decision/surgical intervention, no progress towards goals, refusal/missing three consecutive treatments without notification or medical reason.  [name], Meribeth Mattes 08/04/2011, 4:37 PM

## 2011-08-04 NOTE — Progress Notes (Signed)
CSW spoke with patient re: [employer], patient states that he had made his payment and everything should be fine. CSW spoke with Magda Paganini @ [facility] who stated they are just waiting on authorization from [place], anticipating discharge today. CSW will continue to follow-up.   Unice Bailey, LCSWA [phone]

## 2011-08-04 NOTE — Progress Notes (Signed)
Occupational Therapy Treatment Patient Details Name: [name] Mccoy MRN: [id number] DOB: 09/20/1960 Today's Date: 08/04/2011  OT Assessment/Plan OT Assessment/Plan Comments on Treatment Session: Pt tolerated minimal amount of bathing but limited due to pain/spasm. Pt supposed to possibly discharge to SNF today. OT Plan: Discharge plan remains appropriate OT Frequency: Min 1X/week Follow Up Recommendations: Skilled nursing facility Equipment Recommended: Defer to next venue OT Goals ADL Goals ADL Goal: Lower Body Bathing - Progress: Progressing toward goals Miscellaneous OT Goals OT Goal: Miscellaneous Goal #4 - Progress: Met (pt with open anterior KI and is tolerating it well per pt.)  OT Treatment Precautions/Restrictions  Precautions Precautions: Fall Precaution Comments: pt has MS Required Braces or Orthoses: Yes Knee Immobilizer: Other (comment) (pt has open anterior knee immobilizer L LE)   ADL ADL Lower Body Bathing: Performed;Maximal assistance;Other (comment) (did not wash posterior periarea. pt states too much pain ) Lower Body Bathing Details (indicate cue type and reason): supine. Pt able to reach top of legs and front private area. Assisted with lower legs/feet and as pt not able to reach to lower legs. Did not attempt roll to wash periarea (back) as pt requeted to not roll right now due to spasm. Informed nsg tech to address this later today when pt can tolerate. Where Assessed - Lower Body Bathing: Supine, head of bed up ADL Comments: Reviewed positioning of LEs. Replaced and adjust pillows after bathing and informed pt of best positioning. Only unfastened KI to wash top of L LE. Did not doff KI. Mobility    Exercises    End of Session OT - End of Session Activity Tolerance: Patient limited by pain Patient left: in bed;with call bell in reach General Behavior During Session: [facility] for tasks performed Cognition: [facility] for tasks performed  [name] Mccoy  [phone] 08/04/2011, 1:06 PM

## 2011-08-04 NOTE — Discharge Summary (Signed)
Physician Discharge Summary  Patient ID: ELBRIDGE MAGOWAN MRN: [id number] DOB/AGE: Jul 25, 1960 51 y.o.  Admit date: 07/25/2011 Discharge date: 08/04/2011  Primary Care Physician:  No primary provider on file.   Discharge Diagnoses:    Present on Admission:  .Rhabdomyolysis .Multiple sclerosis .Dehydration .Fecal impaction .Normocytic anemia .Fall .Wounds, multiple .Thrombocytosis  Discharge Medications:  Medication List  As of 08/04/2011  2:42 PM   TAKE these medications         amantadine 100 MG capsule   Commonly known as: SYMMETREL   Take 200 mg by mouth daily.      calcium carbonate 1250 MG tablet   Commonly known as: OS-CAL - dosed in mg of elemental calcium   Take 1 tablet by mouth daily.      ciprofloxacin 500 MG tablet   Commonly known as: CIPRO   Take 1 tablet (500 mg total) by mouth 2 (two) times daily. Take x 4 days      collagenase ointment   Commonly known as: SANTYL   Apply topically daily. Apply to non-viable tissue      dantrolene 25 MG capsule   Commonly known as: DANTRIUM   Take 25 mg by mouth 3 (three) times daily.      doxycycline 100 MG tablet   Commonly known as: VIBRA-TABS   Take 1 tablet (100 mg total) by mouth 2 (two) times daily. Take x 4 days      gabapentin 600 MG tablet   Commonly known as: NEURONTIN   Take 600 mg by mouth 4 (four) times daily.      HYDROcodone-acetaminophen 5-325 MG per tablet   Commonly known as: NORCO   Take 1 tablet by mouth every 4 (four) hours as needed.      interferon beta-1b 0.3 MG injection   Commonly known as: BETASERON   Inject 0.25 mg into the skin every other day. Next dose 07-11-2011      modafinil 200 MG tablet   Commonly known as: PROVIGIL   Take 200 mg by mouth daily.      polyethylene glycol packet   Commonly known as: MIRALAX / GLYCOLAX   Take 17 g by mouth daily.             Disposition and Follow-up: The patient is being discharged to a SNF. He is instructed to follow up with the  PCP at the SNF in one week.   Medical Consults:  Dr. Faith Rogue, Physical Medicine and Rehabilitation: Skilled nursing home placement recommended. Dr. Avel Peace, Surgery: S/P debridement of the right hip wound at the bedside; Hydrotherapy recommended for other wounds.   Other Consults:  Physical therapy: Inpatient rehabilitation, skilled or LTACH  Occupational therapy: Skilled nursing facility  Wound care: Santyl for chemical debridement of nonviable tissue; Foam dressings to areas without eschar. Optimize nutrition. Air mattress for pressure reduction.   Significant Diagnostic Studies:  Ct Pelvis W Contrast 07/29/2011 IMPRESSION: Cholelithiasis is suspected. Fecal impaction in the rectum is noted. The rectal wall is edematous and thickened. No evidence of subcutaneous abscess. Stranding in the subcutaneous fat of the buttock is noted which may represent cellulitis. No convincing evidence of a destructive bony process to suggest osteomyelitis. Scrotal hydrocele is suspected. Original Report Authenticated By: Donavan Burnet, M.D.     Discharge Laboratory Values: Basic Metabolic Panel:  Lab 08/02/11 7829 07/31/11 0400 07/29/11 0400  NA -- 136 136  K -- 4.3 3.6  CL -- 102 104  CO2 -- 26  26  GLUCOSE -- 112* 135*  BUN -- 9 6  CREATININE 0.59 0.60 0.52  CALCIUM -- 8.1* 8.0*  MG -- -- --  PHOS -- -- --   GFR Estimated Creatinine Clearance: 101.6 ml/min (by C-G formula based on Cr of 0.59).  CBC:  Lab 08/02/11 0407 08/01/11 0355 07/31/11 0400 07/30/11 0355 07/29/11 0400  WBC 11.9* 11.9* 11.1* 12.4* 9.6  NEUTROABS -- -- -- -- --  HGB 10.3* 10.1* 10.5* 11.1* 10.8*  HCT 31.6* 30.7* 32.9* 34.8* 33.3*  MCV 81.0 81.6 82.5 83.3 82.8  PLT 663* 602* 572* 529* 375   Microbiology Recent Results (from the past 240 hour(s))  MRSA PCR SCREENING     Status: Normal   Collection Time   07/26/11  5:21 AM      Component Value Range Status Comment   MRSA by PCR NEGATIVE  NEGATIVE  Final    CULTURE, BLOOD (ROUTINE X 2)     Status: Normal   Collection Time   07/28/11  4:55 PM      Component Value Range Status Comment   Specimen Description BLOOD LEFT HAND   Final    Special Requests BOTTLES DRAWN AEROBIC ONLY 3CC   Final    Culture  Setup Time [id number]   Final    Culture NO GROWTH 5 DAYS   Final    Report Status 08/04/2011 FINAL   Final   CULTURE, BLOOD (ROUTINE X 2)     Status: Normal   Collection Time   07/28/11  5:00 PM      Component Value Range Status Comment   Specimen Description BLOOD LEFT HAND   Final    Special Requests BOTTLES DRAWN AEROBIC ONLY 3CC   Final    Culture  Setup Time [id number]   Final    Culture NO GROWTH 5 DAYS   Final    Report Status 08/04/2011 FINAL   Final   URINE CULTURE     Status: Normal   Collection Time   07/28/11  6:43 PM      Component Value Range Status Comment   Specimen Description URINE, CATHETERIZED   Final    Special Requests NONE   Final    Culture  Setup Time [id number]   Final    Colony Count NO GROWTH   Final    Culture NO GROWTH   Final    Report Status 07/29/2011 FINAL   Final   CULTURE, BLOOD (ROUTINE X 2)     Status: Normal (Preliminary result)   Collection Time   07/30/11 10:45 PM      Component Value Range Status Comment   Specimen Description BLOOD RIGHT HAND   Final    Special Requests BOTTLES DRAWN AEROBIC AND ANAEROBIC 2.5 CC EA   Final    Culture  Setup Time [id number]   Final    Culture     Final    Value:        BLOOD CULTURE RECEIVED NO GROWTH TO DATE CULTURE WILL BE HELD FOR 5 DAYS BEFORE ISSUING A FINAL NEGATIVE REPORT   Report Status PENDING   Incomplete   CULTURE, BLOOD (ROUTINE X 2)     Status: Normal (Preliminary result)   Collection Time   07/30/11 10:48 PM      Component Value Range Status Comment   Specimen Description BLOOD LEFT HAND   Final    Special Requests BOTTLES DRAWN AEROBIC AND ANAEROBIC 3 CC EA   Final  Culture  Setup Time [id number]   Final    Culture     Final     Value:        BLOOD CULTURE RECEIVED NO GROWTH TO DATE CULTURE WILL BE HELD FOR 5 DAYS BEFORE ISSUING A FINAL NEGATIVE REPORT   Report Status PENDING   Incomplete   CULTURE, BLOOD (SINGLE)     Status: Normal (Preliminary result)   Collection Time   08/01/11 11:58 PM      Component Value Range Status Comment   Specimen Description BLOOD LEFT HAND   Final    Special Requests Normal BOTTLES DRAWN AEROBIC AND ANAEROBIC 3CC   Final    Culture  Setup Time [id number]   Final    Culture     Final    Value:        BLOOD CULTURE RECEIVED NO GROWTH TO DATE CULTURE WILL BE HELD FOR 5 DAYS BEFORE ISSUING A FINAL NEGATIVE REPORT   Report Status PENDING   Incomplete      Brief H and P: For complete details please refer to admission H and P, but in brief, Mr. Schroepfer is a 51 year old man with a PMH of multiple sclerosis who presented to the ER with a history of a fall approximately 5 days prior to admission, which resulted in prolonged immobility leading to dehydration and rhabdomyolysis. He was also noted to be febrile and have cellulitis.    Physical Exam at Discharge: BP 100/68  Pulse 108  Temp(Src) 98.6 F (37 C) (Oral)  Resp 20  Ht 6\' 3"  (1.905 m)  Wt 64.3 kg (141 lb 12.1 oz)  BMI 17.72 kg/m2  SpO2 95% Gen:  NAD Cardiovascular:  RRR, No M/R/G Respiratory: Lungs CTAB Gastrointestinal: Abdomen soft, NT/ND with normal active bowel sounds. Extremities: No C/E/C  Hospital Course:  Principal Problem:  *Rhabdomyolysis secondary to fall  The patient was admitted and put on IV fluids for rehydration. His CK levels have gradually come down. His renal function remained stable.  Active Problems:  Multiple sclerosis  Patient has limited mobility due to his history of multiple sclerosis. Plan is to discharge him to a skilled nursing facility on 08/04/2011.  Dehydration  Patient has been hydrated and this has now resolved.  Fecal impaction  Patient has been put on daily MiraLAX. His bowels are  moving regularly.  Normocytic anemia  Mild and likely secondary to anemia of acute illness.  Wounds, multiple  The patient has been seen by the wound care RN. He has multiple pressure ulcers. His right hip has unstageable wounds. His right back is unstageable wounds. He also has wounds to the left inner knee, right outer knee, right knee, and left hip. Wound care recommendations were noted. Patient will continue on Santyl for chemical debridement of nonviable tissue and foam dressings to help with pressure reduction. The patient has been seen by general surgery for evaluation of the need for debridement of his wounds. Dr. Abbey Chatters performed a bedside debridement on 07/29/2011 and the patient has been treated with hydrotherapy to his other wounds. The patient remains on empiric vancomycin and Zosyn while in the hospital.  Hyperglycemia  Patient's fasting blood glucoses have been elevated. He does not have a known history of diabetes. Hemoglobin A1c was checked and found to be 5.6%. We will discontinue CBG checks.  Thrombocytosis  Likely reactive. We'll continue to monitor.   Recommendations for hospital follow-up:  1. F/U with Dr. Sandria Manly in 2 weeks.   Diet: General  Activity: Increase activity slowly   Condition at Discharge: Improved   Time spent on Discharge: 40 minutes  Signed: Dr. Trula Ore Hadyn Blanck Pager [phone] 08/04/2011, 2:42 PM

## 2011-08-06 LAB — CULTURE, BLOOD (ROUTINE X 2)
Culture  Setup Time: 201303140211
Culture: NO GROWTH
Culture: NO GROWTH

## 2011-08-08 LAB — CULTURE, BLOOD (SINGLE): Special Requests: NORMAL

## 2011-09-04 ENCOUNTER — Encounter (HOSPITAL_BASED_OUTPATIENT_CLINIC_OR_DEPARTMENT_OTHER): Payer: BC Managed Care – PPO | Attending: Internal Medicine

## 2011-09-04 DIAGNOSIS — L89899 Pressure ulcer of other site, unspecified stage: Secondary | ICD-10-CM | POA: Insufficient documentation

## 2011-09-04 DIAGNOSIS — L89209 Pressure ulcer of unspecified hip, unspecified stage: Secondary | ICD-10-CM | POA: Insufficient documentation

## 2011-09-04 DIAGNOSIS — G35 Multiple sclerosis: Secondary | ICD-10-CM | POA: Insufficient documentation

## 2011-09-04 DIAGNOSIS — Z79899 Other long term (current) drug therapy: Secondary | ICD-10-CM | POA: Insufficient documentation

## 2011-09-04 DIAGNOSIS — L8990 Pressure ulcer of unspecified site, unspecified stage: Secondary | ICD-10-CM | POA: Insufficient documentation

## 2011-09-10 NOTE — Progress Notes (Signed)
[facility]  NAME:  [name], Mccoy                   ACCOUNT NO.:  MEDICAL RECORD NO.:  [id number]      DATE OF BIRTH:  24-Nov-1960  PHYSICIAN:  Joanne Gavel, M.D.         VISIT DATE:  09/09/2011                                  OFFICE VISIT   CHIEF COMPLAINT:  Multiple decubitus wounds.  HISTORY OF PRESENT ILLNESS:  This is a 51 year old male, who has had multiple sclerosis for 15 years.  In addition, he had a fall recently, which has left him bed-ridden and wheelchair-ridden.  He noticed several decubiti, which seemed to come on all at once approximately 2 months ago.  These have been treated in his nursing home with various ointments.  PAST MEDICAL HISTORY:  Significant for rhabdomyolysis.  PAST SURGICAL HISTORY:  None.  ALLERGIES:  None.  CIGARETTES AND ALCOHOL:  None.  MEDICATIONS:  Amantadine, dantrolene, gabapentin, MiraLAX, oxycodone, multiple vitamins, and zinc sulfate.  REVIEW OF SYSTEMS:  As above.  PHYSICAL EXAMINATION:  GENERAL:  The patient is extremely thin, awake, and alert, in no distress.  He has several contractures.  He is lying on his side.  VITAL SIGNS:  I cannot find his temperature or pulse here, but I will continue to look.  Well developed, well nourished, in no distress.  CRANIUM:  Normocephalic.  EYES, EARS, NOSE, AND THROAT: Normal.  CHEST:  Clear.  HEART:  Regular rhythm.  ABDOMEN:  Not examined.  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  He has multiple decubitus wounds, 3 on the right hip.  One is 6 x 6.5 and goes down to the tendon.  The other are much more superficial.  In the mid-back, there is a 13.1 x 3.2 decubitus, which is very superficial.  All the wounds, except for the deepest one, are covered with abundant granulation tissue, which I have cauterized.  We will dress the wounds with Aquacel with silver, and I will see him in 7 days.  Some of these wounds may require VAC or certainly some building  blocks.  In the meantime, I have ordered laboratory work.     Joanne Gavel, M.D.     RA/MEDQ  D:  09/09/2011  T:  09/09/2011  Job:  [id number]

## 2011-09-23 ENCOUNTER — Encounter (HOSPITAL_BASED_OUTPATIENT_CLINIC_OR_DEPARTMENT_OTHER): Payer: BC Managed Care – PPO | Attending: Internal Medicine

## 2011-09-23 DIAGNOSIS — L8992 Pressure ulcer of unspecified site, stage 2: Secondary | ICD-10-CM | POA: Insufficient documentation

## 2011-09-23 DIAGNOSIS — L89899 Pressure ulcer of other site, unspecified stage: Secondary | ICD-10-CM | POA: Insufficient documentation

## 2011-09-23 DIAGNOSIS — L8993 Pressure ulcer of unspecified site, stage 3: Secondary | ICD-10-CM | POA: Insufficient documentation

## 2011-09-23 DIAGNOSIS — G8220 Paraplegia, unspecified: Secondary | ICD-10-CM | POA: Insufficient documentation

## 2011-09-23 DIAGNOSIS — L89209 Pressure ulcer of unspecified hip, unspecified stage: Secondary | ICD-10-CM | POA: Insufficient documentation

## 2011-09-23 DIAGNOSIS — G35 Multiple sclerosis: Secondary | ICD-10-CM | POA: Insufficient documentation

## 2011-10-02 ENCOUNTER — Encounter (HOSPITAL_BASED_OUTPATIENT_CLINIC_OR_DEPARTMENT_OTHER): Payer: BC Managed Care – PPO

## 2011-10-21 ENCOUNTER — Encounter (HOSPITAL_BASED_OUTPATIENT_CLINIC_OR_DEPARTMENT_OTHER): Payer: BC Managed Care – PPO | Attending: General Surgery

## 2011-10-21 DIAGNOSIS — G35 Multiple sclerosis: Secondary | ICD-10-CM | POA: Insufficient documentation

## 2011-10-21 DIAGNOSIS — Z79899 Other long term (current) drug therapy: Secondary | ICD-10-CM | POA: Insufficient documentation

## 2011-10-21 DIAGNOSIS — L8990 Pressure ulcer of unspecified site, unspecified stage: Secondary | ICD-10-CM | POA: Insufficient documentation

## 2011-10-21 DIAGNOSIS — L89109 Pressure ulcer of unspecified part of back, unspecified stage: Secondary | ICD-10-CM | POA: Insufficient documentation

## 2011-10-21 DIAGNOSIS — L89209 Pressure ulcer of unspecified hip, unspecified stage: Secondary | ICD-10-CM | POA: Insufficient documentation

## 2011-11-18 ENCOUNTER — Encounter (HOSPITAL_BASED_OUTPATIENT_CLINIC_OR_DEPARTMENT_OTHER): Payer: BC Managed Care – PPO

## 2011-12-02 ENCOUNTER — Encounter (HOSPITAL_BASED_OUTPATIENT_CLINIC_OR_DEPARTMENT_OTHER): Payer: BC Managed Care – PPO | Attending: General Surgery

## 2011-12-23 ENCOUNTER — Encounter (HOSPITAL_BASED_OUTPATIENT_CLINIC_OR_DEPARTMENT_OTHER): Payer: BC Managed Care – PPO | Attending: General Surgery

## 2011-12-23 DIAGNOSIS — L89309 Pressure ulcer of unspecified buttock, unspecified stage: Secondary | ICD-10-CM | POA: Insufficient documentation

## 2011-12-23 DIAGNOSIS — G8220 Paraplegia, unspecified: Secondary | ICD-10-CM | POA: Insufficient documentation

## 2011-12-23 DIAGNOSIS — L89109 Pressure ulcer of unspecified part of back, unspecified stage: Secondary | ICD-10-CM | POA: Insufficient documentation

## 2011-12-23 DIAGNOSIS — L8990 Pressure ulcer of unspecified site, unspecified stage: Secondary | ICD-10-CM | POA: Insufficient documentation

## 2012-02-03 ENCOUNTER — Encounter (HOSPITAL_BASED_OUTPATIENT_CLINIC_OR_DEPARTMENT_OTHER): Payer: BC Managed Care – PPO | Attending: General Surgery

## 2012-02-03 DIAGNOSIS — L89109 Pressure ulcer of unspecified part of back, unspecified stage: Secondary | ICD-10-CM | POA: Insufficient documentation

## 2012-02-03 DIAGNOSIS — L89209 Pressure ulcer of unspecified hip, unspecified stage: Secondary | ICD-10-CM | POA: Insufficient documentation

## 2012-02-03 DIAGNOSIS — L8993 Pressure ulcer of unspecified site, stage 3: Secondary | ICD-10-CM | POA: Insufficient documentation

## 2012-02-03 DIAGNOSIS — L8992 Pressure ulcer of unspecified site, stage 2: Secondary | ICD-10-CM | POA: Insufficient documentation

## 2012-02-04 ENCOUNTER — Encounter (HOSPITAL_BASED_OUTPATIENT_CLINIC_OR_DEPARTMENT_OTHER): Payer: BC Managed Care – PPO

## 2012-03-09 ENCOUNTER — Encounter (HOSPITAL_BASED_OUTPATIENT_CLINIC_OR_DEPARTMENT_OTHER): Payer: BC Managed Care – PPO | Attending: General Surgery

## 2012-03-09 DIAGNOSIS — L89209 Pressure ulcer of unspecified hip, unspecified stage: Secondary | ICD-10-CM | POA: Insufficient documentation

## 2012-03-09 DIAGNOSIS — Z79899 Other long term (current) drug therapy: Secondary | ICD-10-CM | POA: Insufficient documentation

## 2012-03-09 DIAGNOSIS — G35 Multiple sclerosis: Secondary | ICD-10-CM | POA: Insufficient documentation

## 2012-03-09 DIAGNOSIS — L8993 Pressure ulcer of unspecified site, stage 3: Secondary | ICD-10-CM | POA: Insufficient documentation

## 2012-04-13 ENCOUNTER — Encounter (HOSPITAL_BASED_OUTPATIENT_CLINIC_OR_DEPARTMENT_OTHER): Payer: BC Managed Care – PPO

## 2012-04-20 ENCOUNTER — Encounter (HOSPITAL_BASED_OUTPATIENT_CLINIC_OR_DEPARTMENT_OTHER): Payer: BC Managed Care – PPO | Attending: General Surgery

## 2012-04-20 DIAGNOSIS — L8993 Pressure ulcer of unspecified site, stage 3: Secondary | ICD-10-CM | POA: Insufficient documentation

## 2012-04-20 DIAGNOSIS — L89209 Pressure ulcer of unspecified hip, unspecified stage: Secondary | ICD-10-CM | POA: Insufficient documentation

## 2012-05-25 ENCOUNTER — Encounter (HOSPITAL_BASED_OUTPATIENT_CLINIC_OR_DEPARTMENT_OTHER): Payer: BC Managed Care – PPO

## 2012-06-22 ENCOUNTER — Encounter (HOSPITAL_BASED_OUTPATIENT_CLINIC_OR_DEPARTMENT_OTHER): Payer: BC Managed Care – PPO

## 2012-07-22 ENCOUNTER — Ambulatory Visit: Payer: BC Managed Care – PPO | Admitting: Physical Therapy

## 2012-08-04 ENCOUNTER — Telehealth: Payer: Self-pay

## 2012-08-04 NOTE — Telephone Encounter (Signed)
I have called [name] Mccoy, he complains of both hip stiffness, pain, difficult to move around, more than usual, more at the socket place, it sounds like hip joints problem,  I suggest him to continue observe his symptoms, keep follow up

## 2012-08-04 NOTE — Telephone Encounter (Signed)
Patient calling and states that he is having a hard time since. Botox injection. He is having a hard time when he gets in bed. Patient states he is very uncomfortable.

## 2012-08-16 ENCOUNTER — Telehealth: Payer: Self-pay

## 2012-08-16 NOTE — Telephone Encounter (Signed)
Patient is still having a hard time he is still really stiff.

## 2012-08-16 NOTE — Telephone Encounter (Addendum)
I have failed to reach him  I have failed to reach him twice,

## 2012-08-19 ENCOUNTER — Encounter: Payer: Self-pay | Admitting: Neurology

## 2012-08-19 NOTE — Telephone Encounter (Signed)
error 

## 2012-08-23 ENCOUNTER — Ambulatory Visit: Payer: BC Managed Care – PPO | Admitting: Physical Therapy

## 2012-08-31 ENCOUNTER — Telehealth: Payer: Self-pay | Admitting: Diagnostic Neuroimaging

## 2012-08-31 NOTE — Telephone Encounter (Signed)
Judeth Cornfield will you call patient to sch.apt.

## 2012-08-31 NOTE — Telephone Encounter (Signed)
Has appt 09-28-12 at 1130

## 2012-08-31 NOTE — Telephone Encounter (Signed)
Pt called and is having extreme fatigue and L arm weakness and L hand coordination problem.

## 2012-08-31 NOTE — Telephone Encounter (Signed)
[name] Mccoy, please give him a follow up app with me or Eber Jones in next available.

## 2012-09-09 ENCOUNTER — Ambulatory Visit: Payer: Medicaid Other | Admitting: Physical Therapy

## 2012-09-15 ENCOUNTER — Encounter (HOSPITAL_COMMUNITY): Payer: Self-pay | Admitting: Emergency Medicine

## 2012-09-15 ENCOUNTER — Inpatient Hospital Stay (HOSPITAL_COMMUNITY)
Admission: EM | Admit: 2012-09-15 | Discharge: 2012-09-21 | DRG: 871 | Disposition: A | Discharge status: Home Health | Payer: Medicaid Other | Attending: Internal Medicine | Admitting: Internal Medicine

## 2012-09-15 ENCOUNTER — Emergency Department (HOSPITAL_COMMUNITY): Payer: Medicaid Other

## 2012-09-15 DIAGNOSIS — I959 Hypotension, unspecified: Secondary | ICD-10-CM

## 2012-09-15 DIAGNOSIS — A419 Sepsis, unspecified organism: Principal | ICD-10-CM | POA: Diagnosis present

## 2012-09-15 DIAGNOSIS — L8995 Pressure ulcer of unspecified site, unstageable: Secondary | ICD-10-CM | POA: Diagnosis present

## 2012-09-15 DIAGNOSIS — L89209 Pressure ulcer of unspecified hip, unspecified stage: Secondary | ICD-10-CM | POA: Diagnosis present

## 2012-09-15 DIAGNOSIS — Z79899 Other long term (current) drug therapy: Secondary | ICD-10-CM

## 2012-09-15 DIAGNOSIS — E876 Hypokalemia: Secondary | ICD-10-CM

## 2012-09-15 DIAGNOSIS — L8994 Pressure ulcer of unspecified site, stage 4: Secondary | ICD-10-CM | POA: Diagnosis present

## 2012-09-15 DIAGNOSIS — D72829 Elevated white blood cell count, unspecified: Secondary | ICD-10-CM | POA: Diagnosis present

## 2012-09-15 DIAGNOSIS — E860 Dehydration: Secondary | ICD-10-CM | POA: Diagnosis present

## 2012-09-15 DIAGNOSIS — T07XXXA Unspecified multiple injuries, initial encounter: Secondary | ICD-10-CM | POA: Diagnosis present

## 2012-09-15 DIAGNOSIS — N390 Urinary tract infection, site not specified: Secondary | ICD-10-CM | POA: Diagnosis present

## 2012-09-15 DIAGNOSIS — D649 Anemia, unspecified: Secondary | ICD-10-CM | POA: Diagnosis present

## 2012-09-15 DIAGNOSIS — R000 Tachycardia, unspecified: Secondary | ICD-10-CM | POA: Diagnosis present

## 2012-09-15 DIAGNOSIS — G35 Multiple sclerosis: Secondary | ICD-10-CM | POA: Diagnosis present

## 2012-09-15 DIAGNOSIS — L89109 Pressure ulcer of unspecified part of back, unspecified stage: Secondary | ICD-10-CM | POA: Diagnosis present

## 2012-09-15 DIAGNOSIS — D473 Essential (hemorrhagic) thrombocythemia: Secondary | ICD-10-CM | POA: Diagnosis present

## 2012-09-15 LAB — COMPREHENSIVE METABOLIC PANEL
Albumin: 2.4 g/dL — ABNORMAL LOW (ref 3.5–5.2)
BUN: 13 mg/dL (ref 6–23)
Calcium: 9.1 mg/dL (ref 8.4–10.5)
Creatinine, Ser: 0.56 mg/dL (ref 0.50–1.35)
Total Bilirubin: 0.3 mg/dL (ref 0.3–1.2)
Total Protein: 8.7 g/dL — ABNORMAL HIGH (ref 6.0–8.3)

## 2012-09-15 LAB — CBC WITH DIFFERENTIAL/PLATELET
Basophils Relative: 1 % (ref 0–1)
Eosinophils Absolute: 0.4 10*3/uL (ref 0.0–0.7)
Eosinophils Relative: 4 % (ref 0–5)
HCT: 34.2 % — ABNORMAL LOW (ref 39.0–52.0)
Hemoglobin: 11 g/dL — ABNORMAL LOW (ref 13.0–17.0)
MCH: 25.4 pg — ABNORMAL LOW (ref 26.0–34.0)
MCHC: 32.2 g/dL (ref 30.0–36.0)
Monocytes Absolute: 0.7 10*3/uL (ref 0.1–1.0)
Monocytes Relative: 7 % (ref 3–12)

## 2012-09-15 LAB — CK: Total CK: 74 U/L (ref 7–232)

## 2012-09-15 MED ORDER — SODIUM CHLORIDE 0.9 % IV BOLUS (SEPSIS)
1000.0000 mL | Freq: Once | INTRAVENOUS | Status: AC
  Administered 2012-09-15: 1000 mL via INTRAVENOUS

## 2012-09-15 MED ORDER — SODIUM CHLORIDE 0.9 % IV BOLUS (SEPSIS)
1000.0000 mL | Freq: Once | INTRAVENOUS | Status: AC
  Administered 2012-09-16: 1000 mL via INTRAVENOUS

## 2012-09-15 NOTE — ED Provider Notes (Signed)
History     CSN: [id number]  Arrival date & time 09/15/12  1515   First MD Initiated Contact with Patient 09/15/12 1539      Chief Complaint  Patient presents with  . Weakness    left arm    (Consider location/radiation/quality/duration/timing/severity/associated sxs/prior treatment) HPI Pt has history of MS and is bedbound. He states he is in the ED because he is unable to reach his neurologist. He has an appointment mid may. States that over the past 6 weeks he has had slowly progressive LUE weakness and loss of coordination. He continues to have bl LE spacticity. He has bl hip ulcers which are unchanged as of late. No fever, chills. No visual changes.  Past Medical History  Diagnosis Date  . Multiple sclerosis     History reviewed. No pertinent past surgical history.  No family history on file.  History  Substance Use Topics  . Smoking status: Never Smoker   . Smokeless tobacco: Not on file  . Alcohol Use: No      Review of Systems  Constitutional: Negative for fever and chills.  Respiratory: Negative for shortness of breath.   Cardiovascular: Negative for chest pain.  Gastrointestinal: Negative for nausea, vomiting and anal bleeding.  Skin: Positive for wound. Negative for rash.  Neurological: Positive for weakness. Negative for numbness.  All other systems reviewed and are negative.    Allergies  Review of patient's allergies indicates no known allergies.  Home Medications   Current Outpatient Rx  Name  Route  Sig  Dispense  Refill  . amantadine (SYMMETREL) 100 MG capsule   Oral   Take 200 mg by mouth daily.         . calcium carbonate (OS-CAL - DOSED IN MG OF ELEMENTAL CALCIUM) 1250 MG tablet   Oral   Take 1 tablet by mouth daily.         . dantrolene (DANTRIUM) 25 MG capsule   Oral   Take 25 mg by mouth 3 (three) times daily.         Marland Kitchen gabapentin (NEURONTIN) 600 MG tablet   Oral   Take 600 mg by mouth 4 (four) times daily.         .  interferon beta-1b (BETASERON) 0.3 MG injection   Subcutaneous   Inject 0.25 mg into the skin every other day. Next dose 07-11-2011         . modafinil (PROVIGIL) 200 MG tablet   Oral   Take 200 mg by mouth daily.           BP 93/55  Pulse 127  Temp(Src) 98.1 F (36.7 C) (Oral)  Resp 18  SpO2 99%  Physical Exam  Nursing note and vitals reviewed. Constitutional: He is oriented to person, place, and time. He appears well-developed and well-nourished. No distress.  HENT:  Head: Normocephalic and atraumatic.  Mouth/Throat: Oropharynx is clear and moist.  Eyes: EOM are normal. Pupils are equal, round, and reactive to light.  Neck: Normal range of motion. Neck supple.  Cardiovascular: Regular rhythm.   tachycardia  Pulmonary/Chest: Effort normal and breath sounds normal. No respiratory distress. He has no wheezes. He has no rales.  Abdominal: Soft. Bowel sounds are normal. He exhibits no distension and no mass. There is no tenderness. There is no rebound and no guarding.  Musculoskeletal: He exhibits no edema and no tenderness.  Lower ext spasticity and atrophy. Bl hip pressure ulcerations. Question mild purulence of R hip ulcer  Neurological: He is alert and oriented to person, place, and time.  5/5 motor RUE, 4/5 motor LUE, mild ataxia with finger to nose. Sensation intact  Skin: Skin is warm and dry. No rash noted. No erythema.  Psychiatric: He has a normal mood and affect. His behavior is normal.    ED Course  Procedures (including critical care time)  Labs Reviewed  CBC WITH DIFFERENTIAL  COMPREHENSIVE METABOLIC PANEL  URINALYSIS, ROUTINE W REFLEX MICROSCOPIC  CK   No results found.   No diagnosis found.    MDM   Eval'd by neuro. Not felt to be MS flare. Discussed with [facility] who will see in ED.        Loren Racer, MD 09/16/12 [phone]

## 2012-09-15 NOTE — ED Notes (Signed)
Attempted x1 to I&O cath patient,unsuccessful. Gerilyn Pilgrim, NT attempted x1 and was also unsuccessful. RN Kerin Ransom notified, MD Ranae Palms also notified.

## 2012-09-15 NOTE — ED Notes (Signed)
Bladder scanner attempt- 0mL found in bladder.  Attempted several times.  Reported to MD.

## 2012-09-15 NOTE — ED Notes (Signed)
MD at bedside. 

## 2012-09-15 NOTE — ED Notes (Addendum)
Per EMS: Pt from home, MS problems(contracted to right side) along with decubitus ulcers on right hip and back. Pt coming here to seek neurological assistance because doctor retired and now pt is having extremely hard time trying to get in contact with the new one doctor that has taken over.Marland Kitchen

## 2012-09-15 NOTE — ED Notes (Addendum)
Pt requested to be covered with only a sheet and not a gown.  Full thickness, stage III pressure ulcer on right hip and sacral ulcer, stage 2, partial thickness.

## 2012-09-15 NOTE — ED Notes (Signed)
MD aware that bladder scanner showed no urine and NT was unable to pass urinary catheter prior to bladder scanner.

## 2012-09-15 NOTE — ED Notes (Signed)
2 unsuccessful IV attempts on right arm.

## 2012-09-15 NOTE — ED Notes (Addendum)
Attempted bladder scanner 0mL- will notify RN.

## 2012-09-15 NOTE — ED Notes (Signed)
[id number]<JY> Expected date:<BR> Expected time:<BR> Means of arrival:<BR> Comments:<BR> ptar

## 2012-09-15 NOTE — ED Notes (Signed)
IV team notified.

## 2012-09-16 ENCOUNTER — Telehealth: Payer: Self-pay | Admitting: *Deleted

## 2012-09-16 ENCOUNTER — Encounter (HOSPITAL_COMMUNITY): Payer: Self-pay | Admitting: Internal Medicine

## 2012-09-16 DIAGNOSIS — E860 Dehydration: Secondary | ICD-10-CM

## 2012-09-16 DIAGNOSIS — G35 Multiple sclerosis: Secondary | ICD-10-CM

## 2012-09-16 DIAGNOSIS — R6510 Systemic inflammatory response syndrome (SIRS) of non-infectious origin without acute organ dysfunction: Secondary | ICD-10-CM | POA: Insufficient documentation

## 2012-09-16 DIAGNOSIS — I959 Hypotension, unspecified: Secondary | ICD-10-CM

## 2012-09-16 DIAGNOSIS — E876 Hypokalemia: Secondary | ICD-10-CM | POA: Diagnosis not present

## 2012-09-16 DIAGNOSIS — N390 Urinary tract infection, site not specified: Secondary | ICD-10-CM | POA: Diagnosis present

## 2012-09-16 DIAGNOSIS — R000 Tachycardia, unspecified: Secondary | ICD-10-CM | POA: Diagnosis present

## 2012-09-16 DIAGNOSIS — T148XXA Other injury of unspecified body region, initial encounter: Secondary | ICD-10-CM

## 2012-09-16 DIAGNOSIS — D649 Anemia, unspecified: Secondary | ICD-10-CM | POA: Diagnosis present

## 2012-09-16 DIAGNOSIS — M6281 Muscle weakness (generalized): Secondary | ICD-10-CM

## 2012-09-16 LAB — URINALYSIS, ROUTINE W REFLEX MICROSCOPIC
Glucose, UA: NEGATIVE mg/dL
Protein, ur: 100 mg/dL — AB
pH: 7 (ref 5.0–8.0)

## 2012-09-16 LAB — CBC
HCT: 26.9 % — ABNORMAL LOW (ref 39.0–52.0)
Hemoglobin: 8.7 g/dL — ABNORMAL LOW (ref 13.0–17.0)
MCHC: 32.3 g/dL (ref 30.0–36.0)
RBC: 3.42 MIL/uL — ABNORMAL LOW (ref 4.22–5.81)

## 2012-09-16 LAB — COMPREHENSIVE METABOLIC PANEL
ALT: <5 U/L (ref 0–53)
AST: 9 U/L (ref 0–37)
Albumin: 1.9 g/dL — ABNORMAL LOW (ref 3.5–5.2)
BUN: 8 mg/dL (ref 6–23)
CO2: 23 mEq/L (ref 19–32)
Chloride: 106 mEq/L (ref 96–112)
GFR calc Af Amer: >90 mL/min (ref >90)
Potassium: 3 mEq/L — ABNORMAL LOW (ref 3.5–5.1)

## 2012-09-16 LAB — URINE MICROSCOPIC-ADD ON

## 2012-09-16 LAB — PHOSPHORUS: Phosphorus: 2.3 mg/dL (ref 2.3–4.6)

## 2012-09-16 MED ORDER — POTASSIUM CHLORIDE IN NACL 20-0.9 MEQ/L-% IV SOLN
INTRAVENOUS | Status: DC
  Administered 2012-09-16 (×2): via INTRAVENOUS
  Administered 2012-09-17: 1000 mL via INTRAVENOUS
  Filled 2012-09-16 (×3): qty 1000

## 2012-09-16 MED ORDER — ENOXAPARIN SODIUM 40 MG/0.4ML ~~LOC~~ SOLN
40.0000 mg | SUBCUTANEOUS | Status: DC
  Administered 2012-09-16 – 2012-09-21 (×6): 40 mg via SUBCUTANEOUS
  Filled 2012-09-16 (×6): qty 0.4

## 2012-09-16 MED ORDER — POLYETHYLENE GLYCOL 3350 17 G PO PACK
17.0000 g | PACK | Freq: Every day | ORAL | Status: DC | PRN
  Administered 2012-09-20: 17 g via ORAL
  Filled 2012-09-16: qty 1

## 2012-09-16 MED ORDER — DANTROLENE SODIUM 25 MG PO CAPS
25.0000 mg | ORAL_CAPSULE | Freq: Three times a day (TID) | ORAL | Status: DC
  Administered 2012-09-16 – 2012-09-21 (×16): 25 mg via ORAL
  Filled 2012-09-16 (×18): qty 1

## 2012-09-16 MED ORDER — HYDROCODONE-ACETAMINOPHEN 5-325 MG PO TABS
1.0000 | ORAL_TABLET | ORAL | Status: DC | PRN
  Administered 2012-09-16: 1 via ORAL
  Administered 2012-09-16 – 2012-09-21 (×16): 2 via ORAL
  Filled 2012-09-16 (×2): qty 2
  Filled 2012-09-16: qty 1
  Filled 2012-09-16 (×14): qty 2

## 2012-09-16 MED ORDER — EZETIMIBE 10 MG PO TABS
10.0000 mg | ORAL_TABLET | Freq: Every day | ORAL | Status: DC
  Administered 2012-09-16 – 2012-09-21 (×6): 10 mg via ORAL
  Filled 2012-09-16 (×6): qty 1

## 2012-09-16 MED ORDER — ONDANSETRON HCL 4 MG PO TABS: 4.0000 mg | ORAL_TABLET | Freq: Four times a day (QID) | ORAL | Status: DC | PRN

## 2012-09-16 MED ORDER — SODIUM CHLORIDE 0.9 % IV SOLN
INTRAVENOUS | Status: DC
  Administered 2012-09-16 (×2): via INTRAVENOUS

## 2012-09-16 MED ORDER — JUVEN PO PACK
1.0000 | PACK | Freq: Two times a day (BID) | ORAL | Status: DC
  Administered 2012-09-16 – 2012-09-20 (×7): 1 via ORAL
  Filled 2012-09-16 (×12): qty 1

## 2012-09-16 MED ORDER — ADULT MULTIVITAMIN W/MINERALS CH
1.0000 | ORAL_TABLET | Freq: Every day | ORAL | Status: DC
  Administered 2012-09-16 – 2012-09-21 (×6): 1 via ORAL
  Filled 2012-09-16 (×6): qty 1

## 2012-09-16 MED ORDER — FLEET ENEMA 7-19 GM/118ML RE ENEM: 1.0000 | ENEMA | Freq: Once | RECTAL | Status: AC | PRN

## 2012-09-16 MED ORDER — GABAPENTIN 300 MG PO CAPS
600.0000 mg | ORAL_CAPSULE | Freq: Four times a day (QID) | ORAL | Status: DC
  Administered 2012-09-16 – 2012-09-21 (×22): 600 mg via ORAL
  Filled 2012-09-16 (×24): qty 2

## 2012-09-16 MED ORDER — ONDANSETRON HCL 4 MG/2ML IJ SOLN: 4.0000 mg | Freq: Four times a day (QID) | INTRAMUSCULAR | Status: DC | PRN

## 2012-09-16 MED ORDER — DOCUSATE SODIUM 100 MG PO CAPS
100.0000 mg | ORAL_CAPSULE | Freq: Two times a day (BID) | ORAL | Status: DC
  Administered 2012-09-16 – 2012-09-21 (×11): 100 mg via ORAL
  Filled 2012-09-16 (×12): qty 1

## 2012-09-16 MED ORDER — INTERFERON BETA-1B 0.3 MG ~~LOC~~ KIT: 0.3000 mg | PACK | SUBCUTANEOUS | Status: DC

## 2012-09-16 MED ORDER — CALCIUM CARBONATE 1250 (500 CA) MG PO TABS
1.0000 | ORAL_TABLET | Freq: Every day | ORAL | Status: DC
  Administered 2012-09-17 – 2012-09-21 (×5): 500 mg via ORAL
  Filled 2012-09-16 (×5): qty 1

## 2012-09-16 MED ORDER — VANCOMYCIN HCL 10 G IV SOLR
1250.0000 mg | Freq: Two times a day (BID) | INTRAVENOUS | Status: DC
  Administered 2012-09-16 – 2012-09-17 (×3): 1250 mg via INTRAVENOUS
  Filled 2012-09-16 (×4): qty 1250

## 2012-09-16 MED ORDER — SENNA 8.6 MG PO TABS
1.0000 | ORAL_TABLET | Freq: Two times a day (BID) | ORAL | Status: DC
  Administered 2012-09-16 – 2012-09-21 (×11): 8.6 mg via ORAL
  Filled 2012-09-16 (×10): qty 1

## 2012-09-16 MED ORDER — COLLAGENASE 250 UNIT/GM EX OINT
TOPICAL_OINTMENT | Freq: Every day | CUTANEOUS | Status: DC
  Administered 2012-09-17 – 2012-09-21 (×5): via TOPICAL
  Filled 2012-09-16: qty 30

## 2012-09-16 MED ORDER — AMANTADINE HCL 100 MG PO CAPS
100.0000 mg | ORAL_CAPSULE | Freq: Two times a day (BID) | ORAL | Status: DC
  Administered 2012-09-16 – 2012-09-21 (×11): 100 mg via ORAL
  Filled 2012-09-16 (×12): qty 1

## 2012-09-16 MED ORDER — SODIUM CHLORIDE 0.9 % IJ SOLN
3.0000 mL | Freq: Two times a day (BID) | INTRAMUSCULAR | Status: DC
  Administered 2012-09-16 – 2012-09-21 (×6): 3 mL via INTRAVENOUS

## 2012-09-16 MED ORDER — ENSURE COMPLETE PO LIQD
237.0000 mL | Freq: Three times a day (TID) | ORAL | Status: DC
  Administered 2012-09-16 – 2012-09-21 (×15): 237 mL via ORAL

## 2012-09-16 MED ORDER — ACETAMINOPHEN 325 MG PO TABS: 650.0000 mg | ORAL_TABLET | Freq: Four times a day (QID) | ORAL | Status: DC | PRN

## 2012-09-16 MED ORDER — ACETAMINOPHEN 650 MG RE SUPP: 650.0000 mg | Freq: Four times a day (QID) | RECTAL | Status: DC | PRN

## 2012-09-16 MED ORDER — POTASSIUM CHLORIDE CRYS ER 20 MEQ PO TBCR
40.0000 meq | EXTENDED_RELEASE_TABLET | Freq: Once | ORAL | Status: AC
  Administered 2012-09-16: 40 meq via ORAL
  Filled 2012-09-16: qty 2

## 2012-09-16 MED ORDER — HYDROMORPHONE HCL PF 1 MG/ML IJ SOLN
1.0000 mg | Freq: Once | INTRAMUSCULAR | Status: AC
  Administered 2012-09-16: 1 mg via INTRAVENOUS
  Filled 2012-09-16: qty 1

## 2012-09-16 MED ORDER — DEXTROSE 5 % IV SOLN
1.0000 g | INTRAVENOUS | Status: DC
  Administered 2012-09-16 – 2012-09-20 (×5): 1 g via INTRAVENOUS
  Filled 2012-09-16 (×5): qty 10

## 2012-09-16 MED ORDER — BISACODYL 10 MG RE SUPP: 10.0000 mg | Freq: Every day | RECTAL | Status: DC | PRN

## 2012-09-16 NOTE — ED Notes (Signed)
Pt inform Tech he will try to urinate after he was givena cranberry juice

## 2012-09-16 NOTE — Evaluation (Signed)
Physical Therapy Evaluation Patient Details Name: [name] Mccoy MRN: [id number] DOB: March 06, 1961 Today's Date: 09/16/2012 Time: 9562-1308 PT Time Calculation (min): 25 min  PT Assessment / Plan / Recommendation Clinical Impression  Pt presents with multiple sclerosis and SIRS.  Pt states that he was living alone with 24/7 personal aide, however that it was only one person and that they were also managing wounds and performing ROM.  He also states that this level of immobility started approx 6 weeks ago with Botox injection, however this PT remembers pt from previous stay some time ago for wound care and he was also contracted at that time.  PT to sign off on pt at this time as he is not appropriate for acute level PT at this time.  PT recommends SNF for follow up at d/c.     PT Assessment  All further PT needs can be met in the next venue of care    Follow Up Recommendations  SNF;Supervision/Assistance - 24 hour    Does the patient have the potential to tolerate intense rehabilitation      Barriers to Discharge        Equipment Recommendations   (if were to D/C home, would need lift, w/c and hospital bed)    Recommendations for Other Services     Frequency      Precautions / Restrictions Precautions Precautions: Fall Precaution Comments: Pt with severely contracted B LEs Restrictions Weight Bearing Restrictions: No   Pertinent Vitals/Pain Max c/o pain with any mobility.       Mobility  Bed Mobility Details for Bed Mobility Assistance: Pt unable to tolerate rolling in bed or attempt at sitting at EOB due to increased pain, however did require +2 to attempt any mobility.   Transfers Transfers: Not assessed Ambulation/Gait Ambulation/Gait Assistance: Not tested (comment)    Exercises     PT Diagnosis: Generalized weakness;Acute pain  PT Problem List:   PT Treatment Interventions:     PT Goals    Visit Information  Last PT Received On: 09/16/12 Assistance Needed:  +2    Subjective Data  Subjective: I've been like this since my Botox shot about 6 weeks ago.  Patient Stated Goal: to go to rehab for therapy.    Prior Functioning  Home Living Lives With: Alone Available Help at Discharge: Personal care attendant;Available 24 hours/day Type of Home:  (townhouse) Home Access: Level entry Home Layout: One level Bathroom Shower/Tub: Other (comment) (walk in tub) Bathroom Toilet: Handicapped height Home Adaptive Equipment: Built-in shower seat;Wheelchair - manual;Straight cane (several canes) Prior Function Level of Independence: Needs assistance Needs Assistance: Bathing;Dressing;Grooming;Toileting;Meal Prep;Light Housekeeping (for 6 weeks was bed bound since botox injection in hip per p) Bath: Total Dressing: Maximal Grooming: Supervision/set-up Toileting: Total (using adult diaper) Meal Prep: Total Light Housekeeping: Total Able to Take Stairs?: No Driving: No Communication Communication: No difficulties    Cognition  Cognition Arousal/Alertness: Awake/alert Behavior During Therapy: WFL for tasks assessed/performed Overall Cognitive Status: No family/caregiver present to determine baseline cognitive functioning (Pts history did not correlate with current status. )    Extremity/Trunk Assessment Right Lower Extremity Assessment RLE ROM/Strength/Tone: Unable to fully assess;Deficits RLE ROM/Strength/Tone Deficits: pt with severely contracted BLEs, only able to tolerate mild ROM without increase in pain.  Left Lower Extremity Assessment LLE ROM/Strength/Tone: Deficits;Unable to fully assess LLE ROM/Strength/Tone Deficits: pt with severely contracted BLEs, only able to tolerate mild ROM without increase in pain.    Balance    End of Session  PT - End of Session Activity Tolerance: Patient limited by pain Patient left: in bed;with call bell/phone within reach;with family/visitor present Nurse Communication: Mobility status  GP     Vista Deck 09/16/2012, 12:38 PM

## 2012-09-16 NOTE — Telephone Encounter (Signed)
Message copied by Ardeth Sportsman on Thu Sep 16, 2012  9:18 AM ------      Message from: Wallace Going B      Created: Wed Sep 15, 2012  2:18 PM      Contact: PATIENT       PATIENT HAS MS-HAS BED SORES-PLEASE CALL--[phone](H) ------

## 2012-09-16 NOTE — Consult Note (Signed)
Reason for Consult: Worsening left-sided symptoms Referring Physician: Ranae Palms, D  CC: Worsening left-sided discoordination  History is obtained from: Patient, caregiver  HPI: [name] Mccoy is a 52 y.o. male with a history of multiple sclerosis diagnosed at age 12 was managed on Betaseron and has noticed over the past 6 weeks that he has not had as much control over his left side as he typically does. 2 weeks prior to this, he had received steroids for a similar symptom and had had some improvement. This had started about 6 weeks prior to that, 14 weeks prior to current time.  Of note, his caregiver states that his urine has smell foul for the past week. He had chills yesterday, but states that this could be the Betaseron.  ROS: A 14 point ROS was performed and is negative except as noted in the HPI.  Past Medical History  Diagnosis Date  . Multiple sclerosis     Family History: No history of MS  Social History: Tob: Denies Lives alone.   Exam: Current vital signs: BP 98/53  Pulse 64  Temp(Src) 99.7 F (37.6 C) (Oral)  Resp 22  SpO2 97% Vital signs in last 24 hours: Temp:  [97.9 F (36.6 C)-99.7 F (37.6 C)] 99.7 F (37.6 C) (05/01 0052) Pulse Rate:  [64-127] 64 (05/01 0052) Resp:  [18-27] 22 (05/01 0052) BP: (93-98)/(53-55) 98/53 mmHg (05/01 0052) SpO2:  [97 %-99 %] 97 % (05/01 0052)  General: In bed, NAD CV: Regular in rhythm Mental Status: Patient is awake, alert, oriented to person, place, month, year, and situation. Immediate and remote memory are intact. Patient is able to give a clear and coherent history. No signs of aphasia or neglect.  Cranial Nerves: II: Visual Fields are full. Pupils are equal, round, and reactive to light.  Discs are without papilledema. III,IV, VI: EOMI without ptosis or diploplia.  V: Facial sensation is symmetric to temperature VII: Facial movement is symmetric.  VIII: hearing is intact to voice X: Uvula elevates  symmetrically XI: Shoulder shrug is symmetric. XII: tongue is midline without atrophy or fasciculations.  Motor: Tone is increased. Bulk is markedly reduced.  4/5 strength throughout upper extremities, L weaker than right. He has mild drift of the left upper extremitiy. Proximally, he is 2/5 in left leg, distally, 3/5. In the right, he is 3/5 proximally, 4-/5 distally.   Sensation: Sensation is symmetric to light touch Deep Tendon Reflexes: Brisk in biceps and ankles.  Cerebellar: Difficulty with FNF in left arm.  Gait: Non-ambulatory at baseline.   I have reviewed labs in epic and the results pertinent to this consultation are: Mild leukocytosis.   Impression: 52 yo M with a history of MS and worsening left sided weakness. It is not clear to me that this truly represents an acute MS flare, but rather I feel it is morelikely that he has unmasking of deficits in the setting of physiological stress as evidenced by thrombocytosis, leukocytosis.   Recommendations: 1) Infection workup including UA 2) if infection is ruled out then steroids may be considered, though I am not certain given the amount of time that he has had symptoms coupled with the downsides of steroids in this gentleman(i.e. Wound healing) that his would be the best course.  3)  PT, OT 4) Continue betaseron   Ritta Slot, MD [facility] [phone]  If 7pm- 7am, please page neurology on call at [phone].

## 2012-09-16 NOTE — Consult Note (Signed)
WOC consult Note Reason for Consult:Multiple pressure ulcers secondary to long-term immobility with MS, contractures. All are POA.  There are numerous sites of previously healed tissue injuries. Patient is aware of impact that nutrition and mobility has on wound healing and states he is seeking placement in a SNF following this hospital stay for Rehabilitation services. Wound type:Pressure and medical adhesive related skin injury (MARSI) Pressure Ulcer POA: Yes Measurement: Coccyx pressure ulcer: Unstageable with soft yellow/white eschar measuring 4.5cm x 6.5cm                         Areas (2) of partial thickness skin injury secondary to MARSI at 4 o'clock position to ulcer above the largest of which measures 2.5cm x 2cm x 0.2cm                         Right hip:  8x8cm site of previously healed Stage IV PrU with a small moist, pink area within measuring 4x3x.4cm                         Distal to right hip there is a previously healed area measuring 4x5.5cm with a discoloration within (erythematous, red) measuring 2cm x 1.5cm Wound bed:As above. Drainage (amount, consistency, odor) Drainage on old dressings is serous Periwound:as described above. Intact with previously healed ulcerations Dressing procedure/placement/frequency:Saline dressings twice daily to right hip, soft silicone foam dressing to area of MARSI and enzymatic debriding agent (collagenase/Santyl) to the Unstageable PrU x 21 days.  A low air loss overlay is ordered for when patient transfers from ICU to floor (ICU beds are low air loss; pressure redistribution mattresses are the standard bed on the medical and surgical floors). I will not follow, but will remain available to this patient and his medical and nursing teams.  Please re-consult if wounds deteriorate or if progress is not consistent with overall patient condition. Thanks, Ladona Mow, MSN, RN, [facility], CWOCN [phone])

## 2012-09-16 NOTE — Progress Notes (Signed)
[id number]/Rhonda Davis, RN, BSN, CCM:  CHART REVIEWED AND UPDATED.  Next chart review due on [id number]. NO DISCHARGE NEEDS PRESENT AT THIS TIME. CASE MANAGEMENT [phone] 

## 2012-09-16 NOTE — Telephone Encounter (Signed)
Called patient at home and on cell phone. No answer on either one and could not leave a message. Was trying to get patient seen sooner by Dr. Terrace Arabia.

## 2012-09-16 NOTE — Progress Notes (Signed)
ANTIBIOTIC CONSULT NOTE - INITIAL  Pharmacy Consult for Vancomycin Indication: Decubitis Ulcer   No Known Allergies  Patient Measurements: Height: 6\' 3"  (190.5 cm) Weight: 136 lb 11 oz (62 kg) IBW/kg (Calculated) : 84.5   Vital Signs: Temp: 99.6 F (37.6 C) (05/01 0325) Temp src: Oral (05/01 0325) BP: 89/54 mmHg (05/01 0400) Pulse Rate: 117 (05/01 0400) Intake/Output from previous day: 04/30 0701 - 05/01 0700 In: 655 [P.O.:480; I.V.:125; IV Piggyback:50] Out: -  Intake/Output from this shift: Total I/O In: 655 [P.O.:480; I.V.:125; IV Piggyback:50] Out: -   Labs:  Recent Labs  09/15/12 1700  WBC 10.9*  HGB 11.0*  PLT 733*  CREATININE 0.56   Estimated Creatinine Clearance: 96.9 ml/min (by C-G formula based on Cr of 0.56). No results found for this basename: VANCOTROUGH, VANCOPEAK, VANCORANDOM, GENTTROUGH, GENTPEAK, GENTRANDOM, TOBRATROUGH, TOBRAPEAK, TOBRARND, AMIKACINPEAK, AMIKACINTROU, AMIKACIN,  in the last 72 hours   Microbiology: No results found for this or any previous visit (from the past 720 hour(s)).  Medical History: Past Medical History  Diagnosis Date  . Multiple sclerosis     Medications:  Scheduled:  . amantadine  100 mg Oral BID  . cefTRIAXone (ROCEPHIN) IVPB 1 gram/50 mL D5W  1 g Intravenous Q24H  . dantrolene  25 mg Oral TID  . docusate sodium  100 mg Oral BID  . enoxaparin (LOVENOX) injection  40 mg Subcutaneous Q24H  . ezetimibe  10 mg Oral Daily  . gabapentin  600 mg Oral QID  . [COMPLETED]  HYDROmorphone (DILAUDID) injection  1 mg Intravenous Once  . senna  1 tablet Oral BID  . [COMPLETED] sodium chloride  1,000 mL Intravenous Once  . [COMPLETED] sodium chloride  1,000 mL Intravenous Once  . sodium chloride  3 mL Intravenous Q12H  . vancomycin  1,250 mg Intravenous Q12H   Infusions:  . sodium chloride 125 mL/hr at 09/16/12 0359   Assessment: 52 yo with history of Multiple sclerosis admitted with left arm weakness.  MD ordering  Vancomycin per Rx for decubitis ullcer.  Goal of Therapy:  Vancomycin trough level 15-20 mcg/ml  Plan:   Vancomycin 1250mg  IV q12h.  CrCl~90 (N)  F/U SCr/levels as needed.  Lorenza Evangelist 09/16/2012,4:49 AM

## 2012-09-16 NOTE — H&P (Signed)
PCP:  Londell Moh, MD  [facility]  Chief Complaint:  Left arm weakness  HPI: [name] Mccoy is a 52 y.o. male   has a past medical history of Multiple sclerosis.   Presented with  Reports weakness in left arm for the past 6 weeks and lack of control in left hand. He may have had some low grade fevers at home. No nausea or vomiting, no diarrhea. HH nurse states he have had dark cloudy urine for the past few weeks.   He has been eating and drinking well. His urine output has declined. Denies any dysuria. Unable to obtain urine sample as his urine output is down. He has had botox shots but hey do not seem to help. His ability to move has been declining. He now has numerous bed sores developing that home health has been taking care of. Patient states he has not been able to contact his neurologist and was becoming concerned with progressive symptoms.  IN ER he was found to be hypotensive and tachycardic. Patient states his blood pressure normally runs somewhat low. In 80-90's range.    Neurology consult has been obtained in ER spoke to neurologist myself his point they do not feel that he would benefit from IV steroids. They do not wish for him to be transferred to [facility] but trial worked up for infectious sources  here and stabilize. Hospitalist was called for admission.  Review of Systems:    Pertinent positives include: change in color of urine Constitutional:  No weight loss, night sweats, Fevers, chills, fatigue, weight loss  HEENT:  No headaches, Difficulty swallowing,Tooth/dental problems,Sore throat,  No sneezing, itching, ear ache, nasal congestion, post nasal drip,  Cardio-vascular:  No chest pain, Orthopnea, PND, anasarca, dizziness, palpitations.no Bilateral lower extremity swelling  GI:  No heartburn, indigestion, abdominal pain, nausea, vomiting, diarrhea, change in bowel habits, loss of appetite, melena, blood in stool, hematemesis Resp:  no shortness  of breath at rest. No dyspnea on exertion, No excess mucus, no productive cough, No non-productive cough, No coughing up of blood.No change in color of mucus.No wheezing. Skin:  no rash or lesions. No jaundice GU:  no dysuria,no urgency or frequency. No straining to urinate.  No flank pain.  Musculoskeletal:  No joint pain or no joint swelling. No decreased range of motion. No back pain.  Psych:  No change in mood or affect. No depression or anxiety. No memory loss.  Neuro: no localizing neurological complaints, no tingling, no weakness, no double vision, no gait abnormality, no slurred speech, no confusion  Otherwise ROS are negative except for above, 10 systems were reviewed  Past Medical History: Past Medical History  Diagnosis Date  . Multiple sclerosis    History reviewed. No pertinent past surgical history.   Medications: Prior to Admission medications   Medication Sig Start Date End Date Taking? Authorizing Provider  amantadine (SYMMETREL) 100 MG capsule Take 100 mg by mouth 2 (two) times daily.    Yes Historical Provider, MD  calcium carbonate (OS-CAL - DOSED IN MG OF ELEMENTAL CALCIUM) 1250 MG tablet Take 1 tablet by mouth daily.   Yes Historical Provider, MD  dantrolene (DANTRIUM) 25 MG capsule Take 25 mg by mouth 3 (three) times daily.   Yes Historical Provider, MD  ezetimibe (ZETIA) 10 MG tablet Take 10 mg by mouth daily.   Yes Historical Provider, MD  gabapentin (NEURONTIN) 600 MG tablet Take 600 mg by mouth 4 (four) times daily.   Yes  Historical Provider, MD  interferon beta-1b (BETASERON) 0.3 MG injection Inject 0.3 mg into the skin every other day.    Yes Historical Provider, MD  modafinil (PROVIGIL) 200 MG tablet Take 200 mg by mouth 2 (two) times daily.    Yes Historical Provider, MD    Allergies:  No Known Allergies  Social History:  Bed-bound Lives at   Home with home health  Help about 24/7   reports that he has never smoked. He does not have any  smokeless tobacco history on file. He reports that he does not drink alcohol or use illicit drugs.   Family History: family history includes Dementia in his mother; Heart disease in his father; and Hypertension in his father and mother.    Physical Exam: Patient Vitals for the past 24 hrs:  BP Temp Temp src Pulse Resp SpO2  09/16/12 0052 98/53 mmHg 99.7 F (37.6 C) Oral 64 22 97 %  09/15/12 2330 - - - - 22 -  09/15/12 2115 - - - - 27 -  09/15/12 1820 - 97.9 F (36.6 C) Oral - - -  09/15/12 1524 93/55 mmHg 98.1 F (36.7 C) Oral 127 18 99 %    1. General:  in No Acute distress 2. Psychological: Alert and  Oriented 3. Head/ENT:  Dry Mucous Membranes                          Head Non traumatic, neck supple                          Normal  Dentition 4. SKIN: decreased Skin turgor,  Skin clean numerous decubitus ulcers present one on the right hip which has significant discharge,  smaller ulcer over the sacrum. Ulcer on the small back also shows evidence of breakdown and pertinent discharge. 5. Heart: Regular rate and rhythm no Murmur, Rub or gallop 6. Lungs: Clear to auscultation bilaterally, no wheezes or crackles   7. Abdomen: Soft, non-tender, Non distended 8. Lower extremities: no clubbing, cyanosis, both extremities are rotated at the hips  9. Neurologically intact strength in right upper extremity. Left upper extremity appear weak grip,  low left extremity he is able to slightly move his toes are otherwise severely diminished strength  10. MSK: Contractures present  body mass index is unknown because there is no weight on file.   Labs on Admission:   Recent Labs  09/15/12 1700  NA 139  K 3.6  CL 101  CO2 27  GLUCOSE 105*  BUN 13  CREATININE 0.56  CALCIUM 9.1    Recent Labs  09/15/12 1700  AST 11  ALT <5  ALKPHOS 95  BILITOT 0.3  PROT 8.7*  ALBUMIN 2.4*   No results found for this basename: LIPASE, AMYLASE,  in the last 72 hours  Recent Labs   09/15/12 1700  WBC 10.9*  NEUTROABS 8.3*  HGB 11.0*  HCT 34.2*  MCV 79.0  PLT 733*    Recent Labs  09/15/12 1700  CKTOTAL 74   No results found for this basename: TSH, T4TOTAL, FREET3, T3FREE, THYROIDAB,  in the last 72 hours No results found for this basename: VITAMINB12, FOLATE, FERRITIN, TIBC, IRON, RETICCTPCT,  in the last 72 hours Lab Results  Component Value Date   HGBA1C 5.6 07/31/2011    The CrCl is unknown because both a height and weight (above a minimum accepted value) are required for  this calculation. ABG    Component Value Date/Time   TCO2 27 07/10/2011 2335     No results found for this basename: DDIMER       UA no output since arrival to ER   Cultures:    Component Value Date/Time   SDES BLOOD LEFT HAND 08/01/2011 2358   SPECREQUEST Normal BOTTLES DRAWN AEROBIC AND ANAEROBIC 3CC 08/01/2011 2358   CULT NO GROWTH 5 DAYS 08/01/2011 2358   REPTSTATUS 08/08/2011 FINAL 08/01/2011 2358       Radiological Exams on Admission: Dg Chest Port 1 View  09/16/2012  *RADIOLOGY REPORT*  Clinical Data: Weakness.  Short of breath.  PORTABLE CHEST - 1 VIEW  Comparison: None.  Findings: Severe emphysema is present.  Cardiopericardial silhouette appears within normal limits.  Tortuous thoracic aorta. Upward retraction of the hila.  Linear densities present in the right upper lobe, most compatible with scarring.  No discrete mass lesion is identified. Diffuse prominence of the interstitium is present.  IMPRESSION: Emphysema and scarring, with upward retraction of the hila bilaterally.   Superimposed pneumonia difficult to exclude but not favored.   Original Report Authenticated By: Andreas Newport, M.D.     Chart has been reviewed  Assessment/Plan  This is a 52 year old gentleman with severe dehydration resulting in hypotension and tachycardia. Infection at this point not ruled out.   Present on Admission:  . Multiple sclerosis - neurology is aware and will continue to  follow patient. At this point he is not a candidate for IV steroids as this possibility of infectious process.  . Dehydration - we'll give IV fluids and monitor urine output  . Hypotension - patient states his baseline blood pressures around mid 80s to 90s but usually he is not tachycardic. Will watch carefully and step down given vital sign instability. The watch for signs of sepsis. Obtain lactic acid  Obtain blood urine and wound culture. Cover broadly for now with vancomycin and Rocephin. No evidence of pneumonia chest x-ray. Urine so far was not able to obtain due to decreased urine output  . Tachycardia - significant sinus tachycardia we'll continue with IV rehydration. Watch for evidence of infection/sepsis. Will hold provigil.  . Wounds, multiple - the right foot consult/social work consult patient at this point interested in placement. Also order wound culture.   Prophylaxis: Lovenox, Protonix  CODE STATUS: FULL CODE, but does not wish to be on prolonged life support MPOA Estelle Grumbles [phone]  Other plan as per orders.  I have spent a total of 60 min on this admission and was taken to discuss care of neurology and CCM who will help monitor using e-link.  Zyir Gassert 09/16/2012, 1:14 AM

## 2012-09-16 NOTE — Evaluation (Signed)
Occupational Therapy Evaluation Patient Details Name: [name] Mccoy MRN: [id number] DOB: Sep 14, 1960 Today's Date: 09/16/2012 Time: 6295-2841 OT Time Calculation (min): 25 min  OT Assessment / Plan / Recommendation Clinical Impression  Pt admitted with SIRS.  Pt presents with MS with severe LE contractions and poor tolerance of handling for bed mobility due to LE pain.  Pt is dependent in bathing, dressing and all mobility at baseline and was essentially bedbound at home. Pt is a poor historian of his PLOF. No acute OT needs identified.      OT Assessment  All further OT needs can be met in the next venue of care    Follow Up Recommendations  SNF;Supervision/Assistance - 24 hour    Barriers to Discharge      Equipment Recommendations  None recommended by OT    Recommendations for Other Services    Frequency       Precautions / Restrictions Precautions Precautions: Fall Precaution Comments: Pt with severely contracted B LEs Restrictions Weight Bearing Restrictions: No   Pertinent Vitals/Pain Severe pain in LEs with movement, repositioned    ADL  Eating/Feeding: Set up Where Assessed - Eating/Feeding: Bed level Grooming: Wash/dry hands;Wash/dry face;Teeth care;Set up Where Assessed - Grooming: Supine, head of bed up ADL Comments: Pt reports assist for bathing and dressing at bedlevel.  Pt used an adult diaper and dressed only in a t-shirt as he was essentially bedbound.    OT Diagnosis: Generalized weakness;Cognitive deficits;Acute pain  OT Problem List: Decreased strength;Decreased range of motion;Decreased activity tolerance;Impaired balance (sitting and/or standing);Decreased cognition;Pain OT Treatment Interventions:     OT Goals    Visit Information  Last OT Received On: 09/16/12 Assistance Needed: +2 PT/OT Co-Evaluation/Treatment: Yes    Subjective Data  Subjective: "I want to go someplace where I can get some therapy." Patient Stated Goal: Get my legs moving  again.   Prior Functioning     Home Living Lives With: Alone Available Help at Discharge: Personal care attendant;Available 24 hours/day Type of Home:  (townhouse) Home Access: Level entry Home Layout: One level Bathroom Shower/Tub: Other (comment) (walk in tub) Bathroom Toilet: Handicapped height Home Adaptive Equipment: Built-in shower seat;Wheelchair - manual;Straight cane (several canes) Prior Function Level of Independence: Needs assistance Needs Assistance: Bathing;Dressing;Grooming;Toileting;Meal Prep;Light Housekeeping (for 6 weeks was bed bound since botox injection in hip per p) Bath: Total Dressing: Maximal Grooming: Supervision/set-up Toileting: Total (using adult diaper) Meal Prep: Total Light Housekeeping: Total Able to Take Stairs?: No Driving: No Communication Communication: No difficulties Dominant Hand: Left         Vision/Perception Vision - History Baseline Vision: Wears glasses all the time   Cognition  Cognition Arousal/Alertness: Awake/alert Behavior During Therapy: WFL for tasks assessed/performed Overall Cognitive Status: No family/caregiver present to determine baseline cognitive functioning    Extremity/Trunk Assessment Right Upper Extremity Assessment RUE ROM/Strength/Tone: Deficits RUE ROM/Strength/Tone Deficits: 4-/5 RUE Coordination: WFL - gross/fine motor Left Upper Extremity Assessment LUE ROM/Strength/Tone: Deficits LUE ROM/Strength/Tone Deficits: 3/5 shoulder, gross grasp and triceps, 4/5 biceps LUE Coordination: WFL - gross/fine motor Right Lower Extremity Assessment RLE ROM/Strength/Tone: Unable to fully assess;Deficits RLE ROM/Strength/Tone Deficits: pt with severely contracted BLEs, only able to tolerate mild ROM without increase in pain.  Left Lower Extremity Assessment LLE ROM/Strength/Tone: Deficits;Unable to fully assess LLE ROM/Strength/Tone Deficits: pt with severely contracted BLEs, only able to tolerate mild ROM  without increase in pain.      Mobility Bed Mobility Details for Bed Mobility Assistance: Pt unable to tolerate  rolling in bed or attempt at sitting at EOB due to increased pain, however did require +2 to attempt any mobility.       Exercise     Balance     End of Session OT - End of Session Activity Tolerance: Patient limited by pain Patient left: in bed;with call bell/phone within reach;with family/visitor present  GO     Evern Bio 09/16/2012, 12:48 PM [phone]

## 2012-09-16 NOTE — Progress Notes (Signed)
09/16/12 0425- Nsg note: Pt admitted from ED via stretcher @0340 . Pt A&OX4. Wound care consult ordered. Dressed wounds to protect. R hip stage 3, R buttocks stage 2 x2, L buttock stage 3. R hip applied wet to dry dressing. Other wounds applied foam/allevyn dressings. Pt tolerated transfer well after being given dilaudid in ED. Pt reports wanting to be transferred to SNF when discharged for short term rehabilitation. No obvious signs of distress at this time, will continue to monitor.

## 2012-09-16 NOTE — Progress Notes (Signed)
INITIAL NUTRITION ASSESSMENT  DOCUMENTATION CODES Per approved criteria  -Underweight   INTERVENTION: Provide Ensure Complete po TID, each supplement provides 350 kcal and 13 grams of protein. Provide Multivitamin with minerals daily Provide Juven BID Recommend additional supplements to promote wound healing: 500 mg of Vitamin C BID and 25 mg of Zinc daily  NUTRITION DIAGNOSIS: Increased nutrient needs related to underweight and wound healing as evidenced by pt with BMI of 17.08 and multiple stage 2 and 3 pressure ulcers.   Goal: Pt to meet >/= 90% of their estimated nutrition needs  Monitor:  PO intake Weight Wounds for signs of healing  Reason for Assessment: Low Braden/ Low BMI  52 y.o. male  Admitting Dx: SIRS (systemic inflammatory response syndrome)  ASSESSMENT: 52 y.o. male has a past medical history of Multiple sclerosis.  Presented with reports weakness in left arm for the past 6 weeks and lack of control in left hand. Pt reports that his usual body weight was 160 lbs over 1 year ago, and he hadn't realized how much weight he has lost until this admission. Pt states that he typically only eats one or two meals because that is all he is hungry for but, he does drink about 3 Ensure supplements daily. Pt also states that his weight loss is also likely related to his mother, who used to cook for him, developing Alzheimer's and his hip problems decreasing his ability to prepare his own food.  Pt states that his appetite is good and he is eating well today.  Encouraged pt to eat 3 meals daily with protein at each meal to ensure he gets adequate calories, protein, and nutrients. Encouraged continuation of Ensure supplements po TID as well.  Height: Ht Readings from Last 1 Encounters:  09/16/12 6\' 3"  (1.905 m)    Weight: Wt Readings from Last 1 Encounters:  09/16/12 136 lb 11 oz (62 kg)    Ideal Body Weight: 196 lbs  % Ideal Body Weight: 69%  Wt Readings from Last 10  Encounters:  09/16/12 136 lb 11 oz (62 kg)  07/26/11 141 lb 12.1 oz (64.3 kg)    Usual Body Weight: 160 lbs (over 1 year ago per pt report)  % Usual Body Weight: 85%  BMI:  Body mass index is 17.08 kg/(m^2).  Estimated Nutritional Needs: Kcal: 1860-2050 Protein: 74-93 grams Fluid: 2.1L  Skin: stage 2 pressure ulcers on buttocks and right buttocks/sacrum and stage 3 pressure ulcer on right hip  Diet Order: General  EDUCATION NEEDS: -No education needs identified at this time   Intake/Output Summary (Last 24 hours) at 09/16/12 1428 Last data filed at 09/16/12 1200  Gross per 24 hour  Intake   1717 ml  Output      0 ml  Net   1717 ml    Last BM: PTA  Labs:   Recent Labs Lab 09/15/12 1700 09/16/12 0530  NA 139 138  K 3.6 3.0*  CL 101 106  CO2 27 23  BUN 13 8  CREATININE 0.56 0.48*  CALCIUM 9.1 7.8*  MG  --  1.7  PHOS  --  2.3  GLUCOSE 105* 118*    CBG (last 3)  No results found for this basename: GLUCAP,  in the last 72 hours  Scheduled Meds: . amantadine  100 mg Oral BID  . [START ON 09/17/2012] calcium carbonate  1 tablet Oral Daily  . cefTRIAXone (ROCEPHIN) IVPB 1 gram/50 mL D5W  1 g Intravenous Q24H  .  dantrolene  25 mg Oral TID  . docusate sodium  100 mg Oral BID  . enoxaparin (LOVENOX) injection  40 mg Subcutaneous Q24H  . ezetimibe  10 mg Oral Daily  . gabapentin  600 mg Oral QID  . Interferon Beta-1b  0.3 mg Subcutaneous QODAY  . senna  1 tablet Oral BID  . sodium chloride  3 mL Intravenous Q12H  . vancomycin  1,250 mg Intravenous Q12H    Continuous Infusions: . 0.9 % NaCl with KCl 20 mEq / L 125 mL/hr at 09/16/12 1249    Past Medical History  Diagnosis Date  . Multiple sclerosis     History reviewed. No pertinent past surgical history.  Ian Malkin RD, LDN Inpatient Clinical Dietitian Pager: [phone] After Hours Pager: [phone]

## 2012-09-16 NOTE — ED Notes (Signed)
Pt POA and HPOA is Cain Sieve [phone]

## 2012-09-16 NOTE — Clinical Social Work Psychosocial (Signed)
Clinical Social Work Department BRIEF PSYCHOSOCIAL ASSESSMENT 09/16/2012  Patient:  JACEON, HEIBERGER     Account Number:  [id number]     Admit date:  09/15/2012  Clinical Social Worker:  Jodelle Red  Date/Time:  09/16/2012 11:30 AM  Referred by:  Physician  Date Referred:  09/16/2012 Referred for  SNF Placement   Other Referral:   Interview type:  Patient Other interview type:   chart review, RN    PSYCHOSOCIAL DATA Living Status:  ALONE Admitted from facility:   Level of care:   Primary support name:  Estelle Grumbles Primary support relationship to patient:  FRIEND Degree of support available:   good, but lives alone    CURRENT CONCERNS Current Concerns  Post-Acute Placement   Other Concerns:    SOCIAL WORK ASSESSMENT / PLAN CSW met with Pt due to his request for SNF. Pt eager for rehab after MS flare up. He has been to [facility] in the past and is open to any SNF currently. CSW explained process and how medicaid would have to switch from community to SNF and he is fine with staying at [facility] for the 30 days to do so. CSW provided him with SNF list and will begin SNF process.   Assessment/plan status:  Psychosocial Support/Ongoing Assessment of Needs Other assessment/ plan:   Information/referral to community resources:   SNF list and process    PATIENT'S/FAMILY'S RESPONSE TO PLAN OF CARE: CSW to follow for SNF. Pt aware and agrees.       Doreen Salvage, LCSW ICU/Stepdown Clinical Social Worker [place] Cell [phone] Hours 8am-1200pm M-F

## 2012-09-16 NOTE — Progress Notes (Addendum)
[facility] PROGRESS NOTE  [name] Mccoy [id number]:[id number] DOB: 1961-01-17 DOA: 09/15/2012 PCP: Londell Moh, MD  Brief narrative 52 year old male patient with history of multiple sclerosis diagnosed at age 61, lower extremity contractures, multiple decubitus ulcers on sacrum/right hip, admitted on 09/16/12 with complaints of left upper extremity weakness of 6 weeks duration, fevers and chills at home, cloudy urine, decreased oral intake. In the ED, found to be hypotensive and tachycardic. Neurology consulted and were not convinced of an acute MS flare. He was admitted to step down unit for management of dehydration, hypotension, rule out infections.  Assessment/Plan: 1. SIRS/hypotension and tachycardia, present on admission: Likely secondary to UTI. Clinically improved. Continue IV fluids and antibiotics. Chest x-ray not convincing for pneumonia. Decubitus ulcers do not look grossly infected. Lactate was 0.8. TSH: 3.104. Blood cultures pending. 2. Urinary tract infection: Continue IV Rocephin and vancomycin pending culture results. 3. Hypokalemia: Orally replete and follow BMP in a.m. Magnesium 1.7. 4. Anemia, leukocytosis and thrombocytosis: Anemia is acute on chronic may be secondary to acute illness/dilution. No overt bleeding. Leukocytosis and thrombocytosis secondary to SIRS. Follow CBC in a.m. 5. Multiple decubitus ulcers-right hip and sacrum: Wound care consultation. Mattress overlay. 6. Multiple sclerosis: Neurology consultation appreciated and they do not believe he has an acute flare. Continue Betaseron.  Code Status: Full Family Communication: Discussed with patient. Disposition Plan: Continue treatment for additional 24 hours in the step down unit.   Consultants:  Neurology  Wound care nurse  Procedures:  None  Antibiotics:  IV Rocephin 5/1 >  IV vancomycin 5/1 >   HPI/Subjective: Patient denies any specific complaints but says that he does not feel any  different than on admission. Denies pain.  Objective: Filed Vitals:   09/16/12 0354 09/16/12 0400 09/16/12 0600 09/16/12 0800  BP: 99/62 89/54 90/52  91/60  Pulse: 120 117 107 106  Temp:    98.4 F (36.9 C)  TempSrc:    Oral  Resp: 18 19 27 15   Height: 6\' 3"  (1.905 m)     Weight: 62 kg (136 lb 11 oz)     SpO2: 97% 97% 97% 95%    Intake/Output Summary (Last 24 hours) at 09/16/12 1127 Last data filed at 09/16/12 0700  Gross per 24 hour  Intake   1217 ml  Output      0 ml  Net   1217 ml   Filed Weights   09/16/12 0354  Weight: 62 kg (136 lb 11 oz)    Exam:   General exam: Comfortable.  Respiratory system: Slightly reduced breath sounds in bases but otherwise clear to auscultation. No increased work of breathing.  Cardiovascular system: S1 & S2 heard, RRR-mildly tachycardic. No JVD, murmurs, gallops, clicks or pedal edema.  Gastrointestinal system: Abdomen is nondistended, soft and nontender. Normal bowel sounds heard.  Central nervous system: Alert and oriented. No focal neurological deficits.  Extremities: RUE grade 4+/5 power, LUE grade 4 -/5 power, bilateral lower extremity contractures.   Skin: Patient has stage III ulcer right hip approximately 2 cm diameter ulcer with slough at the base but no drainage and no other acute signs. He also has 2 stage II sacral ulcers which are fairly clean.   Data Reviewed: Basic Metabolic Panel:  Recent Labs Lab 09/15/12 1700 09/16/12 0530  NA 139 138  K 3.6 3.0*  CL 101 106  CO2 27 23  GLUCOSE 105* 118*  BUN 13 8  CREATININE 0.56 0.48*  CALCIUM 9.1 7.8*  MG  --  1.7  PHOS  --  2.3   Liver Function Tests:  Recent Labs Lab 09/15/12 1700 09/16/12 0530  AST 11 9  ALT <5 <5  ALKPHOS 95 78  BILITOT 0.3 0.3  PROT 8.7* 6.7  ALBUMIN 2.4* 1.9*   No results found for this basename: LIPASE, AMYLASE,  in the last 168 hours No results found for this basename: AMMONIA,  in the last 168 hours CBC:  Recent Labs Lab  09/15/12 1700 09/16/12 0530  WBC 10.9* 15.9*  NEUTROABS 8.3*  --   HGB 11.0* 8.7*  HCT 34.2* 26.9*  MCV 79.0 78.7  PLT 733* 560*   Cardiac Enzymes:  Recent Labs Lab 09/15/12 1700  CKTOTAL 74   BNP (last 3 results) No results found for this basename: PROBNP,  in the last 8760 hours CBG: No results found for this basename: GLUCAP,  in the last 168 hours  Recent Results (from the past 240 hour(s))  MRSA PCR SCREENING     Status: None   Collection Time    09/16/12  3:59 AM      Result Value Range Status   MRSA by PCR NEGATIVE  NEGATIVE Final   Comment:            The GeneXpert MRSA Assay (FDA     approved for NASAL specimens     only), is one component of a     comprehensive MRSA colonization     surveillance program. It is not     intended to diagnose MRSA     infection nor to guide or     monitor treatment for     MRSA infections.     Studies: Dg Chest Port 1 View  09/16/2012  *RADIOLOGY REPORT*  Clinical Data: Weakness.  Short of breath.  PORTABLE CHEST - 1 VIEW  Comparison: None.  Findings: Severe emphysema is present.  Cardiopericardial silhouette appears within normal limits.  Tortuous thoracic aorta. Upward retraction of the hila.  Linear densities present in the right upper lobe, most compatible with scarring.  No discrete mass lesion is identified. Diffuse prominence of the interstitium is present.  IMPRESSION: Emphysema and scarring, with upward retraction of the hila bilaterally.   Superimposed pneumonia difficult to exclude but not favored.   Original Report Authenticated By: Andreas Newport, M.D.      Additional labs:   Scheduled Meds: . amantadine  100 mg Oral BID  . cefTRIAXone (ROCEPHIN) IVPB 1 gram/50 mL D5W  1 g Intravenous Q24H  . dantrolene  25 mg Oral TID  . docusate sodium  100 mg Oral BID  . enoxaparin (LOVENOX) injection  40 mg Subcutaneous Q24H  . ezetimibe  10 mg Oral Daily  . gabapentin  600 mg Oral QID  . senna  1 tablet Oral BID  .  sodium chloride  3 mL Intravenous Q12H  . vancomycin  1,250 mg Intravenous Q12H   Continuous Infusions: . sodium chloride 125 mL/hr at 09/16/12 0950    Active Problems:   Multiple sclerosis   Dehydration   Wounds, multiple   Hypotension   Tachycardia    Time spent: 40 minutes    [facility]  [facility] Pager [phone].   If 8PM-8AM, please contact night-coverage at [email or web address], password [facility] 09/16/2012, 11:27 AM  LOS: 1 day

## 2012-09-17 DIAGNOSIS — N390 Urinary tract infection, site not specified: Secondary | ICD-10-CM

## 2012-09-17 DIAGNOSIS — A419 Sepsis, unspecified organism: Principal | ICD-10-CM

## 2012-09-17 DIAGNOSIS — E876 Hypokalemia: Secondary | ICD-10-CM

## 2012-09-17 DIAGNOSIS — D649 Anemia, unspecified: Secondary | ICD-10-CM

## 2012-09-17 LAB — CBC
HCT: 26.9 % — ABNORMAL LOW (ref 39.0–52.0)
MCH: 24.6 pg — ABNORMAL LOW (ref 26.0–34.0)
MCV: 79.6 fL (ref 78.0–100.0)
Platelets: 443 10*3/uL — ABNORMAL HIGH (ref 150–400)
RDW: 14.6 % (ref 11.5–15.5)

## 2012-09-17 LAB — URINE CULTURE: Colony Count: >=100000

## 2012-09-17 LAB — BASIC METABOLIC PANEL
BUN: 9 mg/dL (ref 6–23)
CO2: 23 mEq/L (ref 19–32)
Calcium: 7.8 mg/dL — ABNORMAL LOW (ref 8.4–10.5)
Creatinine, Ser: 0.5 mg/dL (ref 0.50–1.35)

## 2012-09-17 MED ORDER — POTASSIUM CHLORIDE IN NACL 20-0.9 MEQ/L-% IV SOLN
INTRAVENOUS | Status: AC
  Administered 2012-09-18 (×2): via INTRAVENOUS
  Filled 2012-09-17 (×2): qty 1000

## 2012-09-17 NOTE — Clinical Social Work Placement (Addendum)
Clinical Social Work Department CLINICAL SOCIAL WORK PLACEMENT NOTE 09/17/2012  Patient:  [name] Mccoy, [name] Mccoy  Account Number:  [id number] Admit date:  09/15/2012  Clinical Social Worker:  Jodelle Red  Date/time:  09/17/2012 08:48 AM  Clinical Social Work is seeking post-discharge placement for this patient at the following level of care:   SKILLED NURSING   (*CSW will update this form in Epic as items are completed)   09/16/2012  Patient/family provided with [facility] Department of Clinical Social Work's list of facilities offering this level of care within the geographic area requested by the patient (or if unable, by the patient's family).  09/16/2012  Patient/family informed of their freedom to choose among providers that offer the needed level of care, that participate in Medicare, Medicaid or managed care program needed by the patient, have an available bed and are willing to accept the patient.  09/16/2012  Patient/family informed of [facility]' ownership interest in [facility], as well as of the fact that they are under no obligation to receive care at this facility.  PASARR submitted to EDS on 09/16/2012 PASARR number received from EDS on   FL2 transmitted to all facilities in geographic area requested by pt/family on  09/16/2012 FL2 transmitted to all facilities within larger geographic area on   Patient informed that his/her managed care company has contracts with or will negotiate with  certain facilities, including the following:   CSW explained medicaid switch to LTC medicaid and need to stay at [facility] for 30 days. Pt stated understanding.     Patient/family informed of bed offers received:  09/17/2012 Patient chooses bed at  Physician recommends and patient chooses bed at    Patient to be transferred to  on  Home with Home Health Services on 09/21/2012 Patient to be transferred to facility by ambulance [facility])  The following physician request  were entered in Epic:   Additional Comments: Pt has new ins per his report, but no card to date. He plans to get his healthcare worker to bring in his laptop to access the info re. this policy. This will open more options for SNF.   Addendum 5/6: No Medicaid beds available. Pt not willing to look out of county. Pt returning home with home health services.   Doreen Salvage, LCSW ICU/Stepdown Clinical Social Worker [facility] Cell [phone] Hours 8am-1200pm M-F   Jacklynn Lewis, MSW, [facility]  Clinical Social Work [phone]

## 2012-09-17 NOTE — Progress Notes (Signed)
[facility] PROGRESS NOTE  [name] Mccoy XBJ:[id number] DOB: 23-Feb-1961 DOA: 09/15/2012 PCP: Londell Moh, MD  Brief narrative 52 year old male patient with history of multiple sclerosis diagnosed at age 49, lower extremity contractures, multiple decubitus ulcers on sacrum/right hip, admitted on 09/16/12 with complaints of left upper extremity weakness of 6 weeks duration, fevers and chills at home, cloudy urine, decreased oral intake. In the ED, found to be hypotensive and tachycardic. Neurology consulted and were not convinced of an acute MS flare. He was admitted to step down unit for management of dehydration, hypotension, rule out infections.  Assessment/Plan: 1. Sepsis, present on admission: Likely secondary to UTI. Clinically improved. Continue IV fluids and Rocephin. Chest x-ray not convincing for pneumonia. Decubitus ulcers do not look grossly infected. Lactate was 0.8. TSH: 3.104. Blood cultures negative to date. Wound culture are negative to date. Urine culture suggestive of contamination and hence unhelpful. DC vancomycin. Patient usually runs low blood pressures 90-100/60-70 mmHg. Does not look toxic or septic at this time. 2. Urinary tract infection: Continue IV Rocephin. Repeat urine culture but may not be helpful after antibiotics. 3. Hypokalemia: Repleted. Magnesium 1.7. 4. Anemia, leukocytosis and thrombocytosis: Anemia is acute on chronic may be secondary to acute illness/dilution. No overt bleeding. Anemia stable. Leukocytosis resolved and thrombocytosis improved. 5. Multiple decubitus ulcers-right hip and sacrum: Wound care consultation appreciated. Mattress overlay. 6. Multiple sclerosis: Neurology consultation appreciated and they do not believe he has an acute flare. Continue Betaseron.  Code Status: Full Family Communication: Discussed with patient. Disposition Plan: Transfer to regular floor.   Consultants:  Neurology  Wound care  nurse  Procedures:  None  Antibiotics:  IV Rocephin 5/1 >  IV vancomycin 5/1 > 5/2  HPI/Subjective: Patient complains of pain in the hip since Botox injection 6 weeks ago. Since hospitalization, indicates pain is better. Denies complaints.  Objective: Filed Vitals:   09/17/12 0000 09/17/12 0400 09/17/12 0800 09/17/12 1200  BP: 79/54 87/55 79/52  85/50  Pulse: 102 102 97   Temp: 97.6 F (36.4 C) 97.7 F (36.5 C) 97.5 F (36.4 C) 97.6 F (36.4 C)  TempSrc: Oral Oral Oral Oral  Resp: 12 15 12 11   Height:      Weight:      SpO2: 98% 97% 98% 100%    Intake/Output Summary (Last 24 hours) at 09/17/12 1450 Last data filed at 09/17/12 1400  Gross per 24 hour  Intake   3000 ml  Output      0 ml  Net   3000 ml   Filed Weights   09/16/12 0354  Weight: 62 kg (136 lb 11 oz)    Exam:   General exam: Comfortable.  Respiratory system: Slightly reduced breath sounds in bases but otherwise clear to auscultation. No increased work of breathing.  Cardiovascular system: S1 & S2 heard, RRR. No JVD, murmurs, gallops, clicks or pedal edema. Telemetry shows sinus rhythm in the 90s.  Gastrointestinal system: Abdomen is nondistended, soft and nontender. Normal bowel sounds heard.  Central nervous system: Alert and oriented. No focal neurological deficits.  Extremities: RUE grade 4+/5 power, LUE grade 4 -/5 power, bilateral lower extremity contractures.   Skin: Please see wound care team's note for details of ulcers.   Data Reviewed: Basic Metabolic Panel:  Recent Labs Lab 09/15/12 1700 09/16/12 0530 09/17/12 0335  NA 139 138 139  K 3.6 3.0* 4.0  CL 101 106 110  CO2 27 23 23   GLUCOSE 105* 118* 117*  BUN 13 8  9  CREATININE 0.56 0.48* 0.50  CALCIUM 9.1 7.8* 7.8*  MG  --  1.7  --   PHOS  --  2.3  --    Liver Function Tests:  Recent Labs Lab 09/15/12 1700 09/16/12 0530  AST 11 9  ALT <5 <5  ALKPHOS 95 78  BILITOT 0.3 0.3  PROT 8.7* 6.7  ALBUMIN 2.4* 1.9*    No results found for this basename: LIPASE, AMYLASE,  in the last 168 hours No results found for this basename: AMMONIA,  in the last 168 hours CBC:  Recent Labs Lab 09/15/12 1700 09/16/12 0530 09/17/12 0335  WBC 10.9* 15.9* 8.7  NEUTROABS 8.3*  --   --   HGB 11.0* 8.7* 8.3*  HCT 34.2* 26.9* 26.9*  MCV 79.0 78.7 79.6  PLT 733* 560* 443*   Cardiac Enzymes:  Recent Labs Lab 09/15/12 1700  CKTOTAL 74   BNP (last 3 results) No results found for this basename: PROBNP,  in the last 8760 hours CBG: No results found for this basename: GLUCAP,  in the last 168 hours  Recent Results (from the past 240 hour(s))  URINE CULTURE     Status: None   Collection Time    09/16/12  3:10 AM      Result Value Range Status   Specimen Description URINE, CLEAN CATCH   Final   Special Requests NONE   Final   Culture  Setup Time 09/16/2012 10:17   Final   Colony Count >=100,000 COLONIES/ML   Final   Culture     Final   Value: Multiple bacterial morphotypes present, none predominant. Suggest appropriate recollection if clinically indicated.   Report Status 09/17/2012 FINAL   Final  MRSA PCR SCREENING     Status: None   Collection Time    09/16/12  3:59 AM      Result Value Range Status   MRSA by PCR NEGATIVE  NEGATIVE Final   Comment:            The GeneXpert MRSA Assay (FDA     approved for NASAL specimens     only), is one component of a     comprehensive MRSA colonization     surveillance program. It is not     intended to diagnose MRSA     infection nor to guide or     monitor treatment for     MRSA infections.  CULTURE, BLOOD (ROUTINE X 2)     Status: None   Collection Time    09/16/12  5:30 AM      Result Value Range Status   Specimen Description BLOOD RIGHT ARM   Final   Special Requests BOTTLES DRAWN AEROBIC AND ANAEROBIC   Final   Culture  Setup Time 09/16/2012 10:06   Final   Culture     Final   Value:        BLOOD CULTURE RECEIVED NO GROWTH TO DATE CULTURE WILL  BE HELD FOR 5 DAYS BEFORE ISSUING A FINAL NEGATIVE REPORT   Report Status PENDING   Incomplete  CULTURE, BLOOD (ROUTINE X 2)     Status: None   Collection Time    09/16/12  5:40 AM      Result Value Range Status   Specimen Description BLOOD LEFT HAND   Final   Special Requests BOTTLES DRAWN AEROBIC ONLY   Final   Culture  Setup Time 09/16/2012 10:05   Final   Culture  Final   Value:        BLOOD CULTURE RECEIVED NO GROWTH TO DATE CULTURE WILL BE HELD FOR 5 DAYS BEFORE ISSUING A FINAL NEGATIVE REPORT   Report Status PENDING   Incomplete  WOUND CULTURE     Status: None   Collection Time    09/16/12  2:57 PM      Result Value Range Status   Specimen Description LEG   Final   Special Requests Immunocompromised   Final   Gram Stain     Final   Value: RARE WBC PRESENT, PREDOMINANTLY PMN     NO SQUAMOUS EPITHELIAL CELLS SEEN     NO ORGANISMS SEEN   Culture NO GROWTH   Final   Report Status PENDING   Incomplete     Studies: Dg Chest Port 1 View  09/16/2012  *RADIOLOGY REPORT*  Clinical Data: Weakness.  Short of breath.  PORTABLE CHEST - 1 VIEW  Comparison: None.  Findings: Severe emphysema is present.  Cardiopericardial silhouette appears within normal limits.  Tortuous thoracic aorta. Upward retraction of the hila.  Linear densities present in the right upper lobe, most compatible with scarring.  No discrete mass lesion is identified. Diffuse prominence of the interstitium is present.  IMPRESSION: Emphysema and scarring, with upward retraction of the hila bilaterally.   Superimposed pneumonia difficult to exclude but not favored.   Original Report Authenticated By: Andreas Newport, M.D.      Additional labs:   Scheduled Meds: . amantadine  100 mg Oral BID  . calcium carbonate  1 tablet Oral Daily  . cefTRIAXone (ROCEPHIN) IVPB 1 gram/50 mL D5W  1 g Intravenous Q24H  . collagenase   Topical Daily  . dantrolene  25 mg Oral TID  . docusate sodium  100 mg Oral BID  . enoxaparin  (LOVENOX) injection  40 mg Subcutaneous Q24H  . ezetimibe  10 mg Oral Daily  . feeding supplement  237 mL Oral TID BM  . gabapentin  600 mg Oral QID  . Interferon Beta-1b  0.3 mg Subcutaneous QODAY  . multivitamin with minerals  1 tablet Oral Daily  . nutrition supplement  1 packet Oral BID PC  . senna  1 tablet Oral BID  . sodium chloride  3 mL Intravenous Q12H  . vancomycin  1,250 mg Intravenous Q12H   Continuous Infusions:    Principal Problem:   UTI (urinary tract infection) Active Problems:   Multiple sclerosis   Dehydration   Wounds, multiple   Hypotension   Tachycardia   Hypokalemia   Anemia   Sepsis    Time spent: 40 minutes    [facility]  [facility] Pager [phone].   If 8PM-8AM, please contact night-coverage at [email or web address], password [facility] 09/17/2012, 2:50 PM  LOS: 2 days

## 2012-09-18 LAB — CBC
HCT: 28.2 % — ABNORMAL LOW (ref 39.0–52.0)
MCH: 24.1 pg — ABNORMAL LOW (ref 26.0–34.0)
MCHC: 29.8 g/dL — ABNORMAL LOW (ref 30.0–36.0)
MCV: 81 fL (ref 78.0–100.0)
RDW: 14.6 % (ref 11.5–15.5)

## 2012-09-18 LAB — BASIC METABOLIC PANEL
BUN: 12 mg/dL (ref 6–23)
Calcium: 8.1 mg/dL — ABNORMAL LOW (ref 8.4–10.5)
Creatinine, Ser: 0.45 mg/dL — ABNORMAL LOW (ref 0.50–1.35)
GFR calc Af Amer: >90 mL/min (ref >90)
GFR calc non Af Amer: >90 mL/min (ref >90)
Glucose, Bld: 115 mg/dL — ABNORMAL HIGH (ref 70–99)
Potassium: 4.3 mEq/L (ref 3.5–5.1)

## 2012-09-18 LAB — URINALYSIS, ROUTINE W REFLEX MICROSCOPIC
Bilirubin Urine: NEGATIVE
Glucose, UA: NEGATIVE mg/dL
Ketones, ur: NEGATIVE mg/dL
Protein, ur: NEGATIVE mg/dL

## 2012-09-18 MED ORDER — VITAMINS A & D EX OINT
TOPICAL_OINTMENT | CUTANEOUS | Status: AC
  Administered 2012-09-18: 23:00:00
  Filled 2012-09-18: qty 5

## 2012-09-18 NOTE — Progress Notes (Signed)
[facility] PROGRESS NOTE  [name] Mccoy:[id number] DOB: 15-Apr-1961 DOA: 09/15/2012 PCP: Londell Moh, MD  Brief narrative 52 year old male patient with history of multiple sclerosis diagnosed at age 32, lower extremity contractures, multiple decubitus ulcers on sacrum/right hip, admitted on 09/16/12 with complaints of left upper extremity weakness of 6 weeks duration, fevers and chills at home, cloudy urine, decreased oral intake. In the ED, found to be hypotensive and tachycardic. Neurology consulted and were not convinced of an acute MS flare. He was admitted to step down unit for management of dehydration, hypotension, rule out infections. Patient was empirically started on IV Rocephin and vancomycin pending cultures. Urine cultures suggest contamination. UA was positive. Vancomycin discontinued. Patient stabilized and usually run soft blood pressures. Transferred to regular floor on 5/2. Awaiting SNF placement.  Assessment/Plan: 1. Sepsis, present on admission: Likely secondary to UTI. Clinically improved. Continue IV fluids and Rocephin. Chest x-ray not convincing for pneumonia. Decubitus ulcers do not look grossly infected. Lactate was 0.8. TSH: 3.104. Blood cultures negative to date. Wound culture are negative to date. Urine culture suggestive of contamination and hence unhelpful. DCed vancomycin. Patient usually runs low blood pressures 90-100/60-70 mmHg. Does not look toxic or septic at this time. Repeat urine microscopy requested 5/2-to be sent. 2. Urinary tract infection: Continue IV Rocephin. Repeat urine culture but may not be helpful after antibiotics. Continue IV antibiotics while hospitalized and will change to by mouth antibiotics on discharge to complete total 7 days. 3. Hypokalemia: Repleted. Magnesium 1.7. 4. Anemia, leukocytosis and thrombocytosis: Anemia is acute on chronic may be secondary to acute illness/dilution. No overt bleeding. Anemia stable. Leukocytosis  resolved and thrombocytosis improved. 5. Multiple decubitus ulcers-right hip and sacrum: Wound care consultation appreciated. Mattress overlay. 6. Multiple sclerosis: Neurology consultation appreciated and they do not believe he has an acute flare. Continue Betaseron.  Code Status: Full Family Communication: Discussed with patient. Disposition Plan: Medically stable for transfer to SNF when bed available.   Consultants:  Neurology  Wound care nurse  Procedures:  None  Antibiotics:  IV Rocephin 5/1 >  IV vancomycin 5/1 > 5/2  HPI/Subjective: Denies complaints.  Objective: Filed Vitals:   09/17/12 1200 09/17/12 1508 09/17/12 2144 09/18/12 0543  BP: 85/50 98/66 106/64 88/60  Pulse:  107 124 108  Temp: 97.6 F (36.4 C) 98.7 F (37.1 C) 99.6 F (37.6 C) 99.3 F (37.4 C)  TempSrc: Oral Oral Oral Oral  Resp: 11 16 16 18   Height:  6\' 3"  (1.905 m)    Weight:  62 kg (136 lb 11 oz)    SpO2: 100% 100% 99% 99%    Intake/Output Summary (Last 24 hours) at 09/18/12 0916 Last data filed at 09/17/12 1400  Gross per 24 hour  Intake    625 ml  Output      0 ml  Net    625 ml   Filed Weights   09/16/12 0354 09/17/12 1508  Weight: 62 kg (136 lb 11 oz) 62 kg (136 lb 11 oz)    Exam:   General exam: Comfortable. Mucosa moist.  Respiratory system: clear to auscultation. No increased work of breathing.  Cardiovascular system: S1 & S2 heard, RRR. No JVD, murmurs, gallops, clicks or pedal edema. .  Gastrointestinal system: Abdomen is nondistended, soft and nontender. Normal bowel sounds heard.  Central nervous system: Alert and oriented. No focal neurological deficits.  Extremities: RUE grade 4+/5 power, LUE grade 4 -/5 power, bilateral lower extremity contractures.   Skin: Please  see wound care team's note 5/1 for details of ulcers.   Data Reviewed: Basic Metabolic Panel:  Recent Labs Lab 09/15/12 1700 09/16/12 0530 09/17/12 0335 09/18/12 0431  NA 139 138 139  136  K 3.6 3.0* 4.0 4.3  CL 101 106 110 104  CO2 27 23 23 26   GLUCOSE 105* 118* 117* 115*  BUN 13 8 9 12   CREATININE 0.56 0.48* 0.50 0.45*  CALCIUM 9.1 7.8* 7.8* 8.1*  MG  --  1.7  --   --   PHOS  --  2.3  --   --    Liver Function Tests:  Recent Labs Lab 09/15/12 1700 09/16/12 0530  AST 11 9  ALT <5 <5  ALKPHOS 95 78  BILITOT 0.3 0.3  PROT 8.7* 6.7  ALBUMIN 2.4* 1.9*   No results found for this basename: LIPASE, AMYLASE,  in the last 168 hours No results found for this basename: AMMONIA,  in the last 168 hours CBC:  Recent Labs Lab 09/15/12 1700 09/16/12 0530 09/17/12 0335 09/18/12 0431  WBC 10.9* 15.9* 8.7 9.2  NEUTROABS 8.3*  --   --   --   HGB 11.0* 8.7* 8.3* 8.4*  HCT 34.2* 26.9* 26.9* 28.2*  MCV 79.0 78.7 79.6 81.0  PLT 733* 560* 443* 461*   Cardiac Enzymes:  Recent Labs Lab 09/15/12 1700  CKTOTAL 74   BNP (last 3 results) No results found for this basename: PROBNP,  in the last 8760 hours CBG: No results found for this basename: GLUCAP,  in the last 168 hours  Recent Results (from the past 240 hour(s))  URINE CULTURE     Status: None   Collection Time    09/16/12  3:10 AM      Result Value Range Status   Specimen Description URINE, CLEAN CATCH   Final   Special Requests NONE   Final   Culture  Setup Time 09/16/2012 10:17   Final   Colony Count >=100,000 COLONIES/ML   Final   Culture     Final   Value: Multiple bacterial morphotypes present, none predominant. Suggest appropriate recollection if clinically indicated.   Report Status 09/17/2012 FINAL   Final  MRSA PCR SCREENING     Status: None   Collection Time    09/16/12  3:59 AM      Result Value Range Status   MRSA by PCR NEGATIVE  NEGATIVE Final   Comment:            The GeneXpert MRSA Assay (FDA     approved for NASAL specimens     only), is one component of a     comprehensive MRSA colonization     surveillance program. It is not     intended to diagnose MRSA     infection nor  to guide or     monitor treatment for     MRSA infections.  CULTURE, BLOOD (ROUTINE X 2)     Status: None   Collection Time    09/16/12  5:30 AM      Result Value Range Status   Specimen Description BLOOD RIGHT ARM   Final   Special Requests BOTTLES DRAWN AEROBIC AND ANAEROBIC   Final   Culture  Setup Time 09/16/2012 10:06   Final   Culture     Final   Value:        BLOOD CULTURE RECEIVED NO GROWTH TO DATE CULTURE WILL BE HELD FOR 5 DAYS BEFORE ISSUING A  FINAL NEGATIVE REPORT   Report Status PENDING   Incomplete  CULTURE, BLOOD (ROUTINE X 2)     Status: None   Collection Time    09/16/12  5:40 AM      Result Value Range Status   Specimen Description BLOOD LEFT HAND   Final   Special Requests BOTTLES DRAWN AEROBIC ONLY   Final   Culture  Setup Time 09/16/2012 10:05   Final   Culture     Final   Value:        BLOOD CULTURE RECEIVED NO GROWTH TO DATE CULTURE WILL BE HELD FOR 5 DAYS BEFORE ISSUING A FINAL NEGATIVE REPORT   Report Status PENDING   Incomplete  WOUND CULTURE     Status: None   Collection Time    09/16/12  2:57 PM      Result Value Range Status   Specimen Description LEG   Final   Special Requests Immunocompromised   Final   Gram Stain     Final   Value: RARE WBC PRESENT, PREDOMINANTLY PMN     NO SQUAMOUS EPITHELIAL CELLS SEEN     NO ORGANISMS SEEN   Culture Culture reincubated for better growth   Final   Report Status PENDING   Incomplete     Studies: No results found.   Additional labs:   Scheduled Meds: . amantadine  100 mg Oral BID  . calcium carbonate  1 tablet Oral Daily  . cefTRIAXone (ROCEPHIN) IVPB 1 gram/50 mL D5W  1 g Intravenous Q24H  . collagenase   Topical Daily  . dantrolene  25 mg Oral TID  . docusate sodium  100 mg Oral BID  . enoxaparin (LOVENOX) injection  40 mg Subcutaneous Q24H  . ezetimibe  10 mg Oral Daily  . feeding supplement  237 mL Oral TID BM  . gabapentin  600 mg Oral QID  . Interferon Beta-1b  0.3 mg Subcutaneous  QODAY  . multivitamin with minerals  1 tablet Oral Daily  . nutrition supplement  1 packet Oral BID PC  . senna  1 tablet Oral BID  . sodium chloride  3 mL Intravenous Q12H   Continuous Infusions: . 0.9 % NaCl with KCl 20 mEq / L 125 mL/hr at 09/18/12 0030    Principal Problem:   UTI (urinary tract infection) Active Problems:   Multiple sclerosis   Dehydration   Wounds, multiple   Hypotension   Tachycardia   Hypokalemia   Anemia   Sepsis    Time spent: 40 minutes    [facility]  [facility] Pager [phone].   If 8PM-8AM, please contact night-coverage at [email or web address], password [facility] 09/18/2012, 9:16 AM  LOS: 3 days

## 2012-09-19 LAB — URINE CULTURE: Colony Count: NO GROWTH

## 2012-09-19 NOTE — Progress Notes (Signed)
[facility] PROGRESS NOTE  [name] RITCHEY NFA:[id number] DOB: [birth year] DOA: 09/15/2012 PCP: Londell Moh, MD  Brief narrative 52 year old male patient with history of multiple sclerosis diagnosed at age 3, lower extremity contractures, multiple decubitus ulcers on sacrum/right hip, admitted on 09/16/12 with complaints of left upper extremity weakness of 6 weeks duration, fevers and chills at home, cloudy urine, decreased oral intake. In the ED, found to be hypotensive and tachycardic. Neurology consulted and were not convinced of an acute MS flare. He was admitted to step down unit for management of dehydration, hypotension, rule out infections. Patient was empirically started on IV Rocephin and vancomycin pending cultures. Urine cultures suggest contamination. UA was positive. Vancomycin discontinued. Patient stabilized and usually run soft blood pressures. Transferred to regular floor on 5/2. Awaiting SNF placement.  Assessment/Plan: 1. Sepsis, present on admission: Likely secondary to UTI. Clinically improved. Continue IV fluids and Rocephin. Chest x-ray not convincing for pneumonia. Decubitus ulcers do not look grossly infected. Lactate was 0.8. TSH: 3.104. Blood cultures negative to date. Wound culture are negative to date. Urine culture suggestive of contamination and hence unhelpful. DCed vancomycin. Patient usually runs low blood pressures 90-100/60-70 mmHg. Does not look toxic or septic at this time. Repeat urine microscopy 5/3-cultures pending 2. Urinary tract infection: Continue IV Rocephin. Repeat urine culture but may not be helpful after antibiotics. Continue IV antibiotics while hospitalized and will change to by mouth antibiotics on discharge to complete total 7 days. 3. Hypokalemia: Repleted. Magnesium 1.7. 4. Anemia, leukocytosis and thrombocytosis: Anemia is acute on chronic may be secondary to acute illness/dilution. No overt bleeding. Anemia stable. Leukocytosis  resolved and thrombocytosis improved. 5. Multiple decubitus ulcers-right hip and sacrum: Wound care consultation appreciated. Mattress overlay. 6. Multiple sclerosis: Neurology consultation appreciated and they do not believe he has an acute flare. Continue Betaseron.  Code Status: Full Family Communication: Discussed with patient. Disposition Plan: Medically stable for transfer to SNF when bed available.   Consultants:  Neurology  Wound care nurse  Procedures:  None  Antibiotics:  IV Rocephin 5/1 >  IV vancomycin 5/1 > 5/2  HPI/Subjective: Denies complaints.  Objective: Filed Vitals:   09/18/12 0543 09/18/12 1545 09/18/12 2134 09/19/12 0537  BP: 88/60 108/65 102/65 95/65  Pulse: 108 98 95 93  Temp: 99.3 F (37.4 C) 98.3 F (36.8 C) 98.4 F (36.9 C) 98.2 F (36.8 C)  TempSrc: Oral Oral Oral Oral  Resp: 18 16 16 16   Height:      Weight:      SpO2: 99% 100% 100% 100%    Intake/Output Summary (Last 24 hours) at 09/19/12 0958 Last data filed at 09/18/12 2100  Gross per 24 hour  Intake   1360 ml  Output    620 ml  Net    740 ml   Filed Weights   09/16/12 0354 09/17/12 1508  Weight: 62 kg (136 lb 11 oz) 62 kg (136 lb 11 oz)    Exam:   General exam: Comfortable. Mucosa moist.  Respiratory system: clear to auscultation. No increased work of breathing.  Cardiovascular system: S1 & S2 heard, RRR. No JVD, murmurs, gallops, clicks or pedal edema. .  Gastrointestinal system: Abdomen is nondistended, soft and nontender. Normal bowel sounds heard.  Central nervous system: Alert and oriented. No focal neurological deficits.  Extremities: RUE grade 4+/5 power, LUE grade 4 -/5 power, bilateral lower extremity contractures.   Skin: Please see wound care team's note 5/1 for details of ulcers.   Data  Reviewed: Basic Metabolic Panel:  Recent Labs Lab 09/15/12 1700 09/16/12 0530 09/17/12 0335 09/18/12 0431  NA 139 138 139 136  K 3.6 3.0* 4.0 4.3  CL 101  106 110 104  CO2 27 23 23 26   GLUCOSE 105* 118* 117* 115*  BUN 13 8 9 12   CREATININE 0.56 0.48* 0.50 0.45*  CALCIUM 9.1 7.8* 7.8* 8.1*  MG  --  1.7  --   --   PHOS  --  2.3  --   --    Liver Function Tests:  Recent Labs Lab 09/15/12 1700 09/16/12 0530  AST 11 9  ALT <5 <5  ALKPHOS 95 78  BILITOT 0.3 0.3  PROT 8.7* 6.7  ALBUMIN 2.4* 1.9*   No results found for this basename: LIPASE, AMYLASE,  in the last 168 hours No results found for this basename: AMMONIA,  in the last 168 hours CBC:  Recent Labs Lab 09/15/12 1700 09/16/12 0530 09/17/12 0335 09/18/12 0431  WBC 10.9* 15.9* 8.7 9.2  NEUTROABS 8.3*  --   --   --   HGB 11.0* 8.7* 8.3* 8.4*  HCT 34.2* 26.9* 26.9* 28.2*  MCV 79.0 78.7 79.6 81.0  PLT 733* 560* 443* 461*   Cardiac Enzymes:  Recent Labs Lab 09/15/12 1700  CKTOTAL 74   BNP (last 3 results) No results found for this basename: PROBNP,  in the last 8760 hours CBG: No results found for this basename: GLUCAP,  in the last 168 hours  Recent Results (from the past 240 hour(s))  URINE CULTURE     Status: None   Collection Time    09/16/12  3:10 AM      Result Value Range Status   Specimen Description URINE, CLEAN CATCH   Final   Special Requests NONE   Final   Culture  Setup Time 09/16/2012 10:17   Final   Colony Count >=100,000 COLONIES/ML   Final   Culture     Final   Value: Multiple bacterial morphotypes present, none predominant. Suggest appropriate recollection if clinically indicated.   Report Status 09/17/2012 FINAL   Final  MRSA PCR SCREENING     Status: None   Collection Time    09/16/12  3:59 AM      Result Value Range Status   MRSA by PCR NEGATIVE  NEGATIVE Final   Comment:            The GeneXpert MRSA Assay (FDA     approved for NASAL specimens     only), is one component of a     comprehensive MRSA colonization     surveillance program. It is not     intended to diagnose MRSA     infection nor to guide or     monitor treatment  for     MRSA infections.  CULTURE, BLOOD (ROUTINE X 2)     Status: None   Collection Time    09/16/12  5:30 AM      Result Value Range Status   Specimen Description BLOOD RIGHT ARM   Final   Special Requests BOTTLES DRAWN AEROBIC AND ANAEROBIC   Final   Culture  Setup Time 09/16/2012 10:06   Final   Culture     Final   Value:        BLOOD CULTURE RECEIVED NO GROWTH TO DATE CULTURE WILL BE HELD FOR 5 DAYS BEFORE ISSUING A FINAL NEGATIVE REPORT   Report Status PENDING   Incomplete  CULTURE,  BLOOD (ROUTINE X 2)     Status: None   Collection Time    09/16/12  5:40 AM      Result Value Range Status   Specimen Description BLOOD LEFT HAND   Final   Special Requests BOTTLES DRAWN AEROBIC ONLY   Final   Culture  Setup Time 09/16/2012 10:05   Final   Culture     Final   Value:        BLOOD CULTURE RECEIVED NO GROWTH TO DATE CULTURE WILL BE HELD FOR 5 DAYS BEFORE ISSUING A FINAL NEGATIVE REPORT   Report Status PENDING   Incomplete  WOUND CULTURE     Status: None   Collection Time    09/16/12  2:57 PM      Result Value Range Status   Specimen Description LEG   Final   Special Requests Immunocompromised   Final   Gram Stain     Final   Value: RARE WBC PRESENT, PREDOMINANTLY PMN     NO SQUAMOUS EPITHELIAL CELLS SEEN     NO ORGANISMS SEEN   Culture     Final   Value: FEW STAPHYLOCOCCUS AUREUS     Note: RIFAMPIN AND GENTAMICIN SHOULD NOT BE USED AS SINGLE DRUGS FOR TREATMENT OF STAPH INFECTIONS.   Report Status PENDING   Incomplete     Studies: No results found.   Additional labs:   Scheduled Meds: . amantadine  100 mg Oral BID  . calcium carbonate  1 tablet Oral Daily  . cefTRIAXone (ROCEPHIN) IVPB 1 gram/50 mL D5W  1 g Intravenous Q24H  . collagenase   Topical Daily  . dantrolene  25 mg Oral TID  . docusate sodium  100 mg Oral BID  . enoxaparin (LOVENOX) injection  40 mg Subcutaneous Q24H  . ezetimibe  10 mg Oral Daily  . feeding supplement  237 mL Oral TID BM  .  gabapentin  600 mg Oral QID  . Interferon Beta-1b  0.3 mg Subcutaneous QODAY  . multivitamin with minerals  1 tablet Oral Daily  . nutrition supplement  1 packet Oral BID PC  . senna  1 tablet Oral BID  . sodium chloride  3 mL Intravenous Q12H   Continuous Infusions:    Principal Problem:   UTI (urinary tract infection) Active Problems:   Multiple sclerosis   Dehydration   Wounds, multiple   Hypotension   Tachycardia   Hypokalemia   Anemia   Sepsis    Time spent: 20 minutes    [facility]  [facility] Pager [phone].   If 8PM-8AM, please contact night-coverage at [email or web address], password [facility] 09/19/2012, 9:58 AM  LOS: 4 days

## 2012-09-20 LAB — WOUND CULTURE

## 2012-09-20 MED ORDER — CEFUROXIME AXETIL 500 MG PO TABS
500.0000 mg | ORAL_TABLET | Freq: Two times a day (BID) | ORAL | Status: DC
  Administered 2012-09-21: 500 mg via ORAL
  Filled 2012-09-20 (×3): qty 1

## 2012-09-20 NOTE — Progress Notes (Addendum)
CSW continuing to follow for SNF placement.  CSW met with pt at bedside and discussed that currently there are no bed offers and facility considering is [facility].  Pt reports that he has [employer] that will come into effect on 09/30/2012 and provided CSW with application ID # for this plan. CSW discussed that pt will not meet medical criteria to remain in the hospital until 5/15 and will have to be placed under Medicaid at SNF initially. CSW discussed that bed options will likely be limited due to Medicaid only status at this time, but more opportunities for SNF will open up when [facility] plan in enabled.   CSW spoke to [facility] admissions who were going to review pt information for decision about SNF bed.  CSW spoke to [facility] who were going to review clinical information for decision.  CSW to facilitate pt discharge needs to SNF when bed available.  Addendum 3:25pm: CSW received notification from [facility] that they would not be able to offer pt a Medicaid bed. CSW awaiting response from [place]. CSW to contact other facilities that may be able to offer Medicaid only bed. Pt was not open to [place]F out of county at this time. Current barrier is that pt is Medicaid only and Medicaid beds are limited at SNF facilities.   Jacklynn Lewis, MSW, LCSWA  Clinical Social Work [phone]

## 2012-09-20 NOTE — Progress Notes (Signed)
[facility] PROGRESS NOTE  RAFAN Mccoy KZS:[id number] DOB: 06-05-1960 DOA: 09/15/2012 PCP: [name] Moh, MD  Brief narrative 52 year old male patient with history of multiple sclerosis diagnosed at age 74, lower extremity contractures, multiple decubitus ulcers on sacrum/right hip, admitted on 09/16/12 with complaints of left upper extremity weakness of 6 weeks duration, fevers and chills at home, cloudy urine, decreased oral intake. In the ED, found to be hypotensive and tachycardic. Neurology consulted and were not convinced of an acute MS flare. He was admitted to step down unit for management of dehydration, hypotension, rule out infections. Patient was empirically started on IV Rocephin and vancomycin pending cultures. Urine cultures suggest contamination. UA was positive. Vancomycin discontinued. Patient stabilized and usually run soft blood pressures. Transferred to regular floor on 5/2. Awaiting SNF placement.  Assessment/Plan: 1. Sepsis, present on admission: Likely secondary to UTI. Clinically improved. Continue IV fluids and Rocephin. Chest x-ray not convincing for pneumonia. Decubitus ulcers do not look grossly infected. Lactate was 0.8. TSH: 3.104. Blood cultures negative to date. Wound culture are negative to date. Urine culture suggestive of contamination and hence unhelpful. DCed vancomycin. Patient usually runs low blood pressures 90-100/60-70 mmHg. Does not look toxic or septic at this time. Repeat urine culture (while on Rocephin) 5/3-negative. 2. Urinary tract infection: Repeat urine culture but may not be helpful after antibiotics. Completed 5 days of IV Rocephin. We'll change to oral Ceftin for additional 2 days. 3. Hypokalemia: Repleted. Magnesium 1.7. 4. Anemia, leukocytosis and thrombocytosis: Anemia is acute on chronic may be secondary to acute illness/dilution. No overt bleeding. Anemia stable. Leukocytosis resolved and thrombocytosis improved. 5. Multiple  decubitus ulcers-right hip and sacrum: Wound care consultation appreciated. Mattress overlay. 6. Multiple sclerosis: Neurology consultation appreciated and they do not believe he has an acute flare. Continue Betaseron.  Code Status: Full Family Communication: Discussed with patient. Left message for patient's caregiver Ms. [name] Mccoy. Disposition Plan: Medically stable for transfer to SNF when bed available.   Consultants:  Neurology  Wound care nurse  Procedures:  None  Antibiotics:  IV Rocephin 5/1 > 5/5  IV vancomycin 5/1 > 5/2  PO Ceftin 5/6 >   HPI/Subjective: Denies complaints.  Objective: Filed Vitals:   09/19/12 0537 09/19/12 1435 09/19/12 2123 09/20/12 0734  BP: 95/65 102/67 105/65 112/79  Pulse: 93 95 90 101  Temp: 98.2 F (36.8 C) 98.3 F (36.8 C) 98.7 F (37.1 C) 98.3 F (36.8 C)  TempSrc: Oral Oral Oral Oral  Resp: 16 16 18 16   Height:      Weight:      SpO2: 100% 100% 97% 99%    Intake/Output Summary (Last 24 hours) at 09/20/12 1343 Last data filed at 09/20/12 0842  Gross per 24 hour  Intake    360 ml  Output    200 ml  Net    160 ml   Filed Weights   09/16/12 0354 09/17/12 1508  Weight: 62 kg (136 lb 11 oz) 62 kg (136 lb 11 oz)    Exam:   General exam: Comfortable. Mucosa moist.  Respiratory system: clear to auscultation. No increased work of breathing.  Cardiovascular system: S1 & S2 heard, RRR. No JVD, murmurs, gallops, clicks or pedal edema. .  Gastrointestinal system: Abdomen is nondistended, soft and nontender. Normal bowel sounds heard.  Central nervous system: Alert and oriented. No focal neurological deficits.  Extremities: RUE grade 4+/5 power, LUE grade 4 -/5 power, bilateral lower extremity contractures.   Skin: Please see wound  care team's note 5/1 for details of ulcers.   Data Reviewed: Basic Metabolic Panel:  Recent Labs Lab 09/15/12 1700 09/16/12 0530 09/17/12 0335 09/18/12 0431  NA 139 138 139 136   K 3.6 3.0* 4.0 4.3  CL 101 106 110 104  CO2 27 23 23 26   GLUCOSE 105* 118* 117* 115*  BUN 13 8 9 12   CREATININE 0.56 0.48* 0.50 0.45*  CALCIUM 9.1 7.8* 7.8* 8.1*  MG  --  1.7  --   --   PHOS  --  2.3  --   --    Liver Function Tests:  Recent Labs Lab 09/15/12 1700 09/16/12 0530  AST 11 9  ALT <5 <5  ALKPHOS 95 78  BILITOT 0.3 0.3  PROT 8.7* 6.7  ALBUMIN 2.4* 1.9*   No results found for this basename: LIPASE, AMYLASE,  in the last 168 hours No results found for this basename: AMMONIA,  in the last 168 hours CBC:  Recent Labs Lab 09/15/12 1700 09/16/12 0530 09/17/12 0335 09/18/12 0431  WBC 10.9* 15.9* 8.7 9.2  NEUTROABS 8.3*  --   --   --   HGB 11.0* 8.7* 8.3* 8.4*  HCT 34.2* 26.9* 26.9* 28.2*  MCV 79.0 78.7 79.6 81.0  PLT 733* 560* 443* 461*   Cardiac Enzymes:  Recent Labs Lab 09/15/12 1700  CKTOTAL 74   BNP (last 3 results) No results found for this basename: PROBNP,  in the last 8760 hours CBG: No results found for this basename: GLUCAP,  in the last 168 hours  Recent Results (from the past 240 hour(s))  URINE CULTURE     Status: None   Collection Time    09/16/12  3:10 AM      Result Value Range Status   Specimen Description URINE, CLEAN CATCH   Final   Special Requests NONE   Final   Culture  Setup Time 09/16/2012 10:17   Final   Colony Count >=100,000 COLONIES/ML   Final   Culture     Final   Value: Multiple bacterial morphotypes present, none predominant. Suggest appropriate recollection if clinically indicated.   Report Status 09/17/2012 FINAL   Final  MRSA PCR SCREENING     Status: None   Collection Time    09/16/12  3:59 AM      Result Value Range Status   MRSA by PCR NEGATIVE  NEGATIVE Final   Comment:            The GeneXpert MRSA Assay (FDA     approved for NASAL specimens     only), is one component of a     comprehensive MRSA colonization     surveillance program. It is not     intended to diagnose MRSA     infection nor to  guide or     monitor treatment for     MRSA infections.  CULTURE, BLOOD (ROUTINE X 2)     Status: None   Collection Time    09/16/12  5:30 AM      Result Value Range Status   Specimen Description BLOOD RIGHT ARM   Final   Special Requests BOTTLES DRAWN AEROBIC AND ANAEROBIC   Final   Culture  Setup Time 09/16/2012 10:06   Final   Culture     Final   Value:        BLOOD CULTURE RECEIVED NO GROWTH TO DATE CULTURE WILL BE HELD FOR 5 DAYS BEFORE ISSUING A FINAL NEGATIVE  REPORT   Report Status PENDING   Incomplete  CULTURE, BLOOD (ROUTINE X 2)     Status: None   Collection Time    09/16/12  5:40 AM      Result Value Range Status   Specimen Description BLOOD LEFT HAND   Final   Special Requests BOTTLES DRAWN AEROBIC ONLY   Final   Culture  Setup Time 09/16/2012 10:05   Final   Culture     Final   Value:        BLOOD CULTURE RECEIVED NO GROWTH TO DATE CULTURE WILL BE HELD FOR 5 DAYS BEFORE ISSUING A FINAL NEGATIVE REPORT   Report Status PENDING   Incomplete  WOUND CULTURE     Status: None   Collection Time    09/16/12  2:57 PM      Result Value Range Status   Specimen Description LEG   Final   Special Requests Immunocompromised   Final   Gram Stain     Final   Value: RARE WBC PRESENT, PREDOMINANTLY PMN     NO SQUAMOUS EPITHELIAL CELLS SEEN     NO ORGANISMS SEEN   Culture     Final   Value: FEW STAPHYLOCOCCUS AUREUS     Note: RIFAMPIN AND GENTAMICIN SHOULD NOT BE USED AS SINGLE DRUGS FOR TREATMENT OF STAPH INFECTIONS.   Report Status 09/20/2012 FINAL   Final   Organism ID, Bacteria STAPHYLOCOCCUS AUREUS   Final  URINE CULTURE     Status: None   Collection Time    09/18/12  3:29 PM      Result Value Range Status   Specimen Description URINE, CLEAN CATCH   Final   Special Requests Immunocompromised   Final   Culture  Setup Time 09/18/2012 20:04   Final   Colony Count NO GROWTH   Final   Culture NO GROWTH   Final   Report Status 09/19/2012 FINAL   Final     Studies: No  results found.   Additional labs:   Scheduled Meds: . amantadine  100 mg Oral BID  . calcium carbonate  1 tablet Oral Daily  . cefTRIAXone (ROCEPHIN) IVPB 1 gram/50 mL D5W  1 g Intravenous Q24H  . collagenase   Topical Daily  . dantrolene  25 mg Oral TID  . docusate sodium  100 mg Oral BID  . enoxaparin (LOVENOX) injection  40 mg Subcutaneous Q24H  . ezetimibe  10 mg Oral Daily  . feeding supplement  237 mL Oral TID BM  . gabapentin  600 mg Oral QID  . Interferon Beta-1b  0.3 mg Subcutaneous QODAY  . multivitamin with minerals  1 tablet Oral Daily  . nutrition supplement  1 packet Oral BID PC  . senna  1 tablet Oral BID  . sodium chloride  3 mL Intravenous Q12H   Continuous Infusions:    Principal Problem:   UTI (urinary tract infection) Active Problems:   Multiple sclerosis   Dehydration   Wounds, multiple   Hypotension   Tachycardia   Hypokalemia   Anemia   Sepsis    Time spent: 20 minutes    [facility]  [facility] Pager [phone].   If 8PM-8AM, please contact night-coverage at [email or web address], password [facility] 09/20/2012, 1:43 PM  LOS: 5 days

## 2012-09-21 ENCOUNTER — Emergency Department (HOSPITAL_COMMUNITY)
Admission: EM | Admit: 2012-09-21 | Discharge: 2012-09-21 | Disposition: A | Discharge status: Home / Self Care | Payer: Medicaid Other | Attending: Emergency Medicine | Admitting: Emergency Medicine

## 2012-09-21 DIAGNOSIS — Z79899 Other long term (current) drug therapy: Secondary | ICD-10-CM | POA: Insufficient documentation

## 2012-09-21 DIAGNOSIS — Z591 Inadequate housing, unspecified: Secondary | ICD-10-CM | POA: Insufficient documentation

## 2012-09-21 DIAGNOSIS — Z8669 Personal history of other diseases of the nervous system and sense organs: Secondary | ICD-10-CM | POA: Insufficient documentation

## 2012-09-21 DIAGNOSIS — Z742 Need for assistance at home and no other household member able to render care: Secondary | ICD-10-CM

## 2012-09-21 MED ORDER — CEFUROXIME AXETIL 500 MG PO TABS: 500.0000 mg | ORAL_TABLET | Freq: Two times a day (BID) | ORAL | Status: DC

## 2012-09-21 MED ORDER — DSS 100 MG PO CAPS: 100.0000 mg | ORAL_CAPSULE | Freq: Two times a day (BID) | ORAL | Status: DC

## 2012-09-21 MED ORDER — ENSURE COMPLETE PO LIQD: 237.0000 mL | Freq: Three times a day (TID) | ORAL | Status: AC

## 2012-09-21 MED ORDER — SENNA 8.6 MG PO TABS
1.0000 | ORAL_TABLET | Freq: Once | ORAL | Status: AC
  Administered 2012-09-21: 8.6 mg via ORAL
  Filled 2012-09-21: qty 1

## 2012-09-21 MED ORDER — JUVEN PO PACK: 1.0000 | PACK | Freq: Two times a day (BID) | ORAL | Status: DC

## 2012-09-21 MED ORDER — HYDROCODONE-ACETAMINOPHEN 5-325 MG PO TABS
1.0000 | ORAL_TABLET | Freq: Once | ORAL | Status: AC
  Administered 2012-09-21: 1 via ORAL
  Filled 2012-09-21: qty 1

## 2012-09-21 MED ORDER — ADULT MULTIVITAMIN W/MINERALS CH: 1.0000 | ORAL_TABLET | Freq: Every day | ORAL | Status: AC

## 2012-09-21 MED ORDER — COLLAGENASE 250 UNIT/GM EX OINT: TOPICAL_OINTMENT | Freq: Every day | CUTANEOUS | Status: DC

## 2012-09-21 MED ORDER — SENNA 8.6 MG PO TABS: 1.0000 | ORAL_TABLET | Freq: Two times a day (BID) | ORAL | Status: DC

## 2012-09-21 MED ORDER — VITAMINS A & D EX OINT
TOPICAL_OINTMENT | CUTANEOUS | Status: AC
  Administered 2012-09-21: 14:00:00
  Filled 2012-09-21: qty 5

## 2012-09-21 MED ORDER — HYDROCODONE-ACETAMINOPHEN 5-325 MG PO TABS: 1.0000 | ORAL_TABLET | Freq: Three times a day (TID) | ORAL | Status: DC | PRN

## 2012-09-21 MED ORDER — GABAPENTIN 300 MG PO CAPS
600.0000 mg | ORAL_CAPSULE | Freq: Once | ORAL | Status: AC
  Administered 2012-09-21: 600 mg via ORAL
  Filled 2012-09-21: qty 2

## 2012-09-21 MED ORDER — VITAMINS A & D EX OINT
TOPICAL_OINTMENT | CUTANEOUS | Status: AC
  Administered 2012-09-21: 15:00:00
  Filled 2012-09-21: qty 5

## 2012-09-21 NOTE — Care Management (Signed)
CM spoke with patient concerning discharge planning with CSW present at bedside. CSW informed pt SNF bed availability may require search outside of [facility], per pt would like to dc home if SNF not available in [place]. Cm offered pt choice of HH agencies in [place]. Per pt choice [facility] to provide [facility] services upon discharge. Cm spoke with pt's private caretaker [name] Mccoy at [phone] to confirm 24/7 care provided per dc per PT recommendations. Pt and caretaker request RN/PT/aide/Sw for [facility] services. Cm informed pt CSW can arrange SNF placment upon bed availability if unable to manage at home. Pt and care taker request [facility] to tx pt to residence. Pt request hospital bed & hoyer lift for home DME. MD made aware of pt's change in disposition.   [name] Mccoy [name],RN,BSN [phone]

## 2012-09-21 NOTE — Progress Notes (Signed)
Received orders for bed and hoyer lift.  Orders have been faxed to [facility] for processing.

## 2012-09-21 NOTE — Progress Notes (Signed)
CSW continuing to follow.  Pt does not yet have any bed offers at this time given his Medicaid only payor status. CSW awaiting responses from [place], [facility], and [facility].   CSW and RNCM met with pt at bedside to discuss. CSW discussed that currently there are no bed offers for pt. CSW offered to send information to other counties and discussed that pt could likely get a bed in [place], [place]. Pt declined expanding search to other counties. CSW and RNCM discussed the potential of returning home with home health services as pt had previously had 24 hour private caregiver. Pt plans to speak to caregiver to determine if caregiver can continue to provide 24 hour care.   CSW and RNCM to follow up with pt re: disposition plan.  Jacklynn Lewis, MSW, LCSWA  Clinical Social Work [phone]

## 2012-09-21 NOTE — ED Notes (Signed)
[facility] called for transport of pt back to his residence.

## 2012-09-21 NOTE — ED Provider Notes (Signed)
History     CSN: [id number]  Arrival date & time 09/21/12  1711   First MD Initiated Contact with Patient 09/21/12 1716      Chief Complaint  Patient presents with  . Needs home health    (Consider location/radiation/quality/duration/timing/severity/associated sxs/prior treatment) HPI The patient presents on the same day as discharge due to lack of home health care equipment. He was admitted one week ago for sepsis. Today he has no complaints, states that he generally well, and was discharged in stable condition. Upon arriving home after the patient had no hospital bed.  He requires a bit due to his MS, immobilized status. There is no bed in the house.   Past Medical History  Diagnosis Date  . Multiple sclerosis     No past surgical history on file.  Family History  Problem Relation Age of Onset  . Hypertension Mother   . Dementia Mother   . Hypertension Father   . Heart disease Father     History  Substance Use Topics  . Smoking status: Never Smoker   . Smokeless tobacco: Not on file  . Alcohol Use: No      Review of Systems  All other systems reviewed and are negative.    Allergies  Review of patient's allergies indicates no known allergies.  Home Medications   Current Outpatient Rx  Name  Route  Sig  Dispense  Refill  . amantadine (SYMMETREL) 100 MG capsule   Oral   Take 100 mg by mouth 2 (two) times daily.          . calcium carbonate (OS-CAL - DOSED IN MG OF ELEMENTAL CALCIUM) 1250 MG tablet   Oral   Take 1 tablet by mouth daily.         . cefUROXime (CEFTIN) 500 MG tablet   Oral   Take 1 tablet (500 mg total) by mouth 2 (two) times daily with a meal.   3 tablet   0   . collagenase (SANTYL) ointment   Topical   Apply topically daily. Apply to Unstageable pressure ulcer on coccyx once daily.  Apply 1/8 inch thick and top with saline dressing.  Change daily and PRN for soiling or loosening of dressing x 21 days.   90 g   1   .  dantrolene (DANTRIUM) 25 MG capsule   Oral   Take 25 mg by mouth 3 (three) times daily.         Marland Kitchen docusate sodium 100 MG CAPS   Oral   Take 100 mg by mouth 2 (two) times daily.         Marland Kitchen ezetimibe (ZETIA) 10 MG tablet   Oral   Take 10 mg by mouth daily.         . feeding supplement (ENSURE COMPLETE) LIQD   Oral   Take 237 mLs by mouth 3 (three) times daily between meals.         . gabapentin (NEURONTIN) 600 MG tablet   Oral   Take 600 mg by mouth 4 (four) times daily.         Marland Kitchen HYDROcodone-acetaminophen (NORCO/VICODIN) 5-325 MG per tablet   Oral   Take 1-2 tablets by mouth every 8 (eight) hours as needed for pain.   30 tablet   0   . interferon beta-1b (BETASERON) 0.3 MG injection   Subcutaneous   Inject 0.3 mg into the skin every other day.          Marland Kitchen  modafinil (PROVIGIL) 200 MG tablet   Oral   Take 200 mg by mouth 2 (two) times daily.          . Multiple Vitamin (MULTIVITAMIN WITH MINERALS) TABS   Oral   Take 1 tablet by mouth daily.         . nutrition supplement (JUVEN) PACK   Oral   Take 1 packet by mouth 2 (two) times daily after a meal.         . senna (SENOKOT) 8.6 MG TABS   Oral   Take 1 tablet (8.6 mg total) by mouth 2 (two) times daily.           There were no vitals taken for this visit.  Physical Exam  Nursing note and vitals reviewed. Constitutional: He is oriented to person, place, and time. He appears well-developed and well-nourished.  Thin male contracted, but in no distress  HENT:  Head: Normocephalic and atraumatic.  Eyes: Conjunctivae are normal. Right eye exhibits no discharge. Left eye exhibits no discharge.  Neck: No tracheal deviation present.  Pulmonary/Chest: No stridor. No respiratory distress.  Neurological: He is alert and oriented to person, place, and time. No cranial nerve deficit.  Psychiatric: He has a normal mood and affect.    ED Course  Procedures (including critical care time)  Labs Reviewed -  No data to display No results found.   No diagnosis found.  After initial evaluation, the patient's home analgesics were ordered. The home health team is working on obtaining the appropriate equipment for the patient.   9:12 PM The patient's equipment has arrived, no new complaints MDM  This patient presents today as discharge because of a lack of home health equipment at his residence.  After an unremarkable ED stay he was discharged in stable condition.      Gerhard Munch, MD 09/21/12 2112

## 2012-09-21 NOTE — ED Notes (Signed)
Pt states his home health bed is ready. Dr. Jeraldine Loots notified.

## 2012-09-21 NOTE — Progress Notes (Signed)
Pt. Was discharged home with home health. Pt. Was given his discharge instructions and prescriptions.  [facility] was called to transport pt. Home.  Home medications from the pharmacy were picked up and returned to the pt.

## 2012-09-21 NOTE — Discharge Summary (Signed)
Physician Discharge Summary  VAN SEYMORE ZOX:[id number] DOB: 1961-05-15 DOA: 09/15/2012  PCP: Londell Moh, MD  Admit date: 09/15/2012 Discharge date: 09/21/2012  Time spent: Greater than 30 minutes  Recommendations for Outpatient Follow-up:  1. Dr. Merri Brunette, PCP in 1 week with repeat labs (CBC) 2. Dr. Joycelyn Schmid, Neurology in 1 week 3. Home health PT, RN and aide. Hoyer lift and hospital bed.  Discharge Diagnoses:  Principal Problem:   UTI (urinary tract infection) Active Problems:   Multiple sclerosis   Dehydration   Wounds, multiple   Hypotension   Tachycardia   Hypokalemia   Anemia   Sepsis   Discharge Condition: Improved & Stable  Diet recommendation: Regular  Filed Weights   09/16/12 0354 09/17/12 1508  Weight: 62 kg (136 lb 11 oz) 62 kg (136 lb 11 oz)    History of present illness:  52 year old male patient with history of multiple sclerosis diagnosed at age 81, lower extremity contractures, multiple decubitus ulcers on sacrum/right hip, admitted on 09/16/12 with complaints of left upper extremity weakness of 6 weeks duration, fevers and chills at home, cloudy urine, decreased oral intake. In the ED, found to be hypotensive and tachycardic. Neurology consulted and were not convinced of an acute MS flare. He was admitted to step down unit for management of dehydration, hypotension, rule out infections.  Hospital Course:  1. Sepsis, present on admission: Likely secondary to UTI. Patient was initially admitted to step down unit. He was empirically started on IV fluids, vancomycin and Rocephin. Chest x-ray not convincing for pneumonia. Decubitus ulcers do not look grossly infected. Lactate was 0.8. TSH: 3.104. Blood cultures negative to date. Wound culture showed few Staphylococcus aureus. Initial urine culture was suggestive of contamination and was repeated but while on antibiotics and was negative. DCed vancomycin. Patient usually runs low blood  pressures 90-100/60-70 mmHg. Does not look toxic or septic at this time. He was transitioned to oral Ceftin and will complete total 7 days of antibiotics. 2. Urinary tract infection: Management as above. Changed to Ceftin on 09/21/12 and will complete a total 14 days antibiotics. Repeat urine cultures negative. 3. Hypokalemia: Repleted. Magnesium 1.7. 4. Anemia, leukocytosis and thrombocytosis: Anemia is acute on chronic may be secondary to acute illness/dilution. No overt bleeding. Anemia stable. Leukocytosis resolved and thrombocytosis improved. 5. Multiple decubitus ulcers-right hip and sacrum: Wound care consulted and made wound care recommendations. Patient was initially planned for SNF but due to no offers, patient has opted to go home with 24/7 care. Home health services have been arranged. 6. Multiple sclerosis: Neurology consultation appreciated and they do not believe he has an acute flare. Continue Betaseron. Outpatient followup with neurology.   Consultants:  Neurology  Wound care nurse  Procedures:  None   Discharge Exam:  Complaints: Patient complains of some hip pain, chronic but controlled with medications. Denies any other complaints.  Filed Vitals:   09/20/12 0734 09/20/12 1409 09/20/12 2101 09/21/12 0544  BP: 112/79 100/64 111/72 99/66  Pulse: 101 93 112 94  Temp: 98.3 F (36.8 C) 97.7 F (36.5 C) 98.1 F (36.7 C) 98.3 F (36.8 C)  TempSrc: Oral Oral Oral Oral  Resp: 16 16 16 16   Height:      Weight:      SpO2: 99% 100% 99% 100%    General exam: Comfortable. Mucosa moist.  Respiratory system: clear to auscultation. No increased work of breathing.  Cardiovascular system: S1 & S2 heard, RRR. No JVD, murmurs, gallops, clicks or  pedal edema. .  Gastrointestinal system: Abdomen is nondistended, soft and nontender. Normal bowel sounds heard.  Central nervous system: Alert and oriented. No focal neurological deficits.  Extremities: RUE grade 4+/5 power, LUE grade 4  -/5 power, bilateral lower extremity contractures.  Skin: exam per WOC as below Wound type:Pressure and medical adhesive related skin injury (MARSI)  Pressure Ulcer POA: Yes  Measurement: Coccyx pressure ulcer: Unstageable with soft yellow/white eschar measuring 4.5cm x 6.5cm  Areas (2) of partial thickness skin injury secondary to MARSI at 4 o'clock position to ulcer above the largest of which measures 2.5cm x 2cm x 0.2cm  Right hip: 8x8cm site of previously healed Stage IV PrU with a small moist, pink area within measuring 4x3x.4cm  Distal to right hip there is a previously healed area measuring 4x5.5cm with a discoloration within (erythematous, red) measuring 2cm x 1.5cm  Wound bed:As above.  Drainage (amount, consistency, odor) Drainage on old dressings is serous  Periwound:as described above. Intact with previously healed ulcerations    Discharge Instructions      Discharge Orders   Future Appointments Provider Department Dept Phone   09/28/2012 11:30 AM Levert Feinstein, MD [facility] [phone]   11/04/2012 2:30 PM Kerry Fort, PT [facility] [phone]   Future Orders Complete By Expires     Call MD for:  extreme fatigue  As directed     Call MD for:  persistant dizziness or light-headedness  As directed     Call MD for:  redness, tenderness, or signs of infection (pain, swelling, redness, odor or green/yellow discharge around incision site)  As directed     Call MD for:  severe uncontrolled pain  As directed     Call MD for:  temperature >100.4  As directed     Diet general  As directed     Discharge wound care:  As directed     Comments:      Please follow detailed wound care instructions as per Wound Care Nurse on 09/16/12. 1. Wound care to Full thickness, Healing Stage IV pressure ulcers on right hip:  Cleanse with NS, pat gently dry.  Cover open areas with saline-moistened gauze 2x2s, opening-taking care to avoid  placement of damp gauze on healed (scarred) areas.  Top with dry gauze and ABD and secure with tape.  Change twice daily and PRN for loosening or soiling of dressing. 2. Wound care to partial thickness tissue loss at coccyx: Cleanse with NS, pat gently dry.  Cover with soft silicone foam dressing (Allevyn, Lawson # [id number])  and change twice weekly and PRN for loosening or soiling of dressing. 3. Wound care to coccyx unstageable pressure ulcer: cleanswe with NS, pat gently dry.  Cover eschar with 1/8 inch thickness of collagenase (Santyl).  Top with saline moistened gauze and cover with dry gauze. Secure with tape.  Change daily and PRN for soiling or loosening of dressing.    Increase activity slowly  As directed         Medication List    TAKE these medications       amantadine 100 MG capsule  Commonly known as:  SYMMETREL  Take 100 mg by mouth 2 (two) times daily.     calcium carbonate 1250 MG tablet  Commonly known as:  OS-CAL - dosed in mg of elemental calcium  Take 1 tablet by mouth daily.     cefUROXime 500 MG tablet  Commonly known as:  CEFTIN  Take 1  tablet (500 mg total) by mouth 2 (two) times daily with a meal.     collagenase ointment  Commonly known as:  SANTYL  Apply topically daily. Apply to Unstageable pressure ulcer on coccyx once daily.  Apply 1/8 inch thick and top with saline dressing.  Change daily and PRN for soiling or loosening of dressing x 21 days.     dantrolene 25 MG capsule  Commonly known as:  DANTRIUM  Take 25 mg by mouth 3 (three) times daily.     DSS 100 MG Caps  Take 100 mg by mouth 2 (two) times daily.     ezetimibe 10 MG tablet  Commonly known as:  ZETIA  Take 10 mg by mouth daily.     feeding supplement Liqd  Take 237 mLs by mouth 3 (three) times daily between meals.     nutrition supplement Pack  Take 1 packet by mouth 2 (two) times daily after a meal.     gabapentin 600 MG tablet  Commonly known as:  NEURONTIN  Take 600 mg by mouth 4  (four) times daily.     HYDROcodone-acetaminophen 5-325 MG per tablet  Commonly known as:  NORCO/VICODIN  Take 1-2 tablets by mouth every 8 (eight) hours as needed for pain.     interferon beta-1b 0.3 MG injection  Commonly known as:  BETASERON  Inject 0.3 mg into the skin every other day.     modafinil 200 MG tablet  Commonly known as:  PROVIGIL  Take 200 mg by mouth 2 (two) times daily.     multivitamin with minerals Tabs  Take 1 tablet by mouth daily.     senna 8.6 MG Tabs  Commonly known as:  SENOKOT  Take 1 tablet (8.6 mg total) by mouth 2 (two) times daily.       Follow-up Information   Follow up with Londell Moh, MD. Schedule an appointment as soon as possible for a visit in 1 week.   Contact information:   [address] [place] [address] [phone]       Follow up with PENUMALLI,VIKRAM, MD. Schedule an appointment as soon as possible for a visit in 1 week.   Contact information:   [address] [place] [address] [phone]        The results of significant diagnostics from this hospitalization (including imaging, microbiology, ancillary and laboratory) are listed below for reference.    Significant Diagnostic Studies: Dg Chest Port 1 View  09/16/2012  *RADIOLOGY REPORT*  Clinical Data: Weakness.  Short of breath.  PORTABLE CHEST - 1 VIEW  Comparison: None.  Findings: Severe emphysema is present.  Cardiopericardial silhouette appears within normal limits.  Tortuous thoracic aorta. Upward retraction of the hila.  Linear densities present in the right upper lobe, most compatible with scarring.  No discrete mass lesion is identified. Diffuse prominence of the interstitium is present.  IMPRESSION: Emphysema and scarring, with upward retraction of the hila bilaterally.   Superimposed pneumonia difficult to exclude but not favored.   Original Report Authenticated By: Andreas Newport, M.D.     Microbiology: Recent  Results (from the past 240 hour(s))  URINE CULTURE     Status: None   Collection Time    09/16/12  3:10 AM      Result Value Range Status   Specimen Description URINE, CLEAN CATCH   Final   Special Requests NONE   Final   Culture  Setup Time 09/16/2012 10:17  Final   Colony Count >=100,000 COLONIES/ML   Final   Culture     Final   Value: Multiple bacterial morphotypes present, none predominant. Suggest appropriate recollection if clinically indicated.   Report Status 09/17/2012 FINAL   Final  MRSA PCR SCREENING     Status: None   Collection Time    09/16/12  3:59 AM      Result Value Range Status   MRSA by PCR NEGATIVE  NEGATIVE Final   Comment:            The GeneXpert MRSA Assay (FDA     approved for NASAL specimens     only), is one component of a     comprehensive MRSA colonization     surveillance program. It is not     intended to diagnose MRSA     infection nor to guide or     monitor treatment for     MRSA infections.  CULTURE, BLOOD (ROUTINE X 2)     Status: None   Collection Time    09/16/12  5:30 AM      Result Value Range Status   Specimen Description BLOOD RIGHT ARM   Final   Special Requests BOTTLES DRAWN AEROBIC AND ANAEROBIC   Final   Culture  Setup Time 09/16/2012 10:06   Final   Culture     Final   Value:        BLOOD CULTURE RECEIVED NO GROWTH TO DATE CULTURE WILL BE HELD FOR 5 DAYS BEFORE ISSUING A FINAL NEGATIVE REPORT   Report Status PENDING   Incomplete  CULTURE, BLOOD (ROUTINE X 2)     Status: None   Collection Time    09/16/12  5:40 AM      Result Value Range Status   Specimen Description BLOOD LEFT HAND   Final   Special Requests BOTTLES DRAWN AEROBIC ONLY   Final   Culture  Setup Time 09/16/2012 10:05   Final   Culture     Final   Value:        BLOOD CULTURE RECEIVED NO GROWTH TO DATE CULTURE WILL BE HELD FOR 5 DAYS BEFORE ISSUING A FINAL NEGATIVE REPORT   Report Status PENDING   Incomplete  WOUND CULTURE     Status: None    Collection Time    09/16/12  2:57 PM      Result Value Range Status   Specimen Description LEG   Final   Special Requests Immunocompromised   Final   Gram Stain     Final   Value: RARE WBC PRESENT, PREDOMINANTLY PMN     NO SQUAMOUS EPITHELIAL CELLS SEEN     NO ORGANISMS SEEN   Culture     Final   Value: FEW STAPHYLOCOCCUS AUREUS     Note: RIFAMPIN AND GENTAMICIN SHOULD NOT BE USED AS SINGLE DRUGS FOR TREATMENT OF STAPH INFECTIONS.   Report Status 09/20/2012 FINAL   Final   Organism ID, Bacteria STAPHYLOCOCCUS AUREUS   Final  URINE CULTURE     Status: None   Collection Time    09/18/12  3:29 PM      Result Value Range Status   Specimen Description URINE, CLEAN CATCH   Final   Special Requests Immunocompromised   Final   Culture  Setup Time 09/18/2012 20:04   Final   Colony Count NO GROWTH   Final   Culture NO GROWTH   Final   Report Status 09/19/2012 FINAL  Final     Labs: Basic Metabolic Panel:  Recent Labs Lab 09/15/12 1700 09/16/12 0530 09/17/12 0335 09/18/12 0431  NA 139 138 139 136  K 3.6 3.0* 4.0 4.3  CL 101 106 110 104  CO2 27 23 23 26   GLUCOSE 105* 118* 117* 115*  BUN 13 8 9 12   CREATININE 0.56 0.48* 0.50 0.45*  CALCIUM 9.1 7.8* 7.8* 8.1*  MG  --  1.7  --   --   PHOS  --  2.3  --   --    Liver Function Tests:  Recent Labs Lab 09/15/12 1700 09/16/12 0530  AST 11 9  ALT <5 <5  ALKPHOS 95 78  BILITOT 0.3 0.3  PROT 8.7* 6.7  ALBUMIN 2.4* 1.9*   No results found for this basename: LIPASE, AMYLASE,  in the last 168 hours No results found for this basename: AMMONIA,  in the last 168 hours CBC:  Recent Labs Lab 09/15/12 1700 09/16/12 0530 09/17/12 0335 09/18/12 0431  WBC 10.9* 15.9* 8.7 9.2  NEUTROABS 8.3*  --   --   --   HGB 11.0* 8.7* 8.3* 8.4*  HCT 34.2* 26.9* 26.9* 28.2*  MCV 79.0 78.7 79.6 81.0  PLT 733* 560* 443* 461*   Cardiac Enzymes:  Recent Labs Lab 09/15/12 1700  CKTOTAL 74   BNP: BNP (last 3 results) No results found for  this basename: PROBNP,  in the last 8760 hours CBG: No results found for this basename: GLUCAP,  in the last 168 hours  Additional labs:  TSH: 3.104   Signed:  HONGALGI,ANAND  [facility] 09/21/2012, 1:50 PM

## 2012-09-21 NOTE — ED Notes (Addendum)
Per PTAR pt was d/c from [facility] hospital today from room 1305. Home health nurse tried to tell staff that home supplies and bed were not ready. PTAR took pt to home, bed not at house, no where to place pt. Pt brought back to ED to stay until home health supplies arrive to home. Home health nurse knows pt is at [facility] ED.

## 2012-09-21 NOTE — Progress Notes (Signed)
No medicaid beds have become available for pt at SNF.  Pt agreeable to return home and pt caregiver will be available to provide 24 hour care for pt along with [facility] services.  CSW discussed with pt at bedside and provided list of SNFs that have been considering in order to assist pt if pt needs to be placed from home.  Pt will have HH MSW at home to assist with placement if needed.  RN plans to arrange ambulance transportation for pt to home.  No further social work needs identified at this time.  CSW signing off.   Jacklynn Lewis, MSW, LCSWA  Clinical Social Work [phone]

## 2012-09-21 NOTE — ED Notes (Signed)
MD at bedside. Dr. Jeraldine Loots at bedside. PTAR reports that pt should receive his new bed in the next few hours.

## 2012-09-21 NOTE — ED Notes (Signed)
UEA:[id number]<WJ> Expected date:<BR> Expected time:<BR> Means of arrival:<BR> Comments:<BR> Pt d/c&#39;d from admission, unable to receive medical supplies now

## 2012-09-21 NOTE — ED Notes (Signed)
PTAR here to take pt back to his residence.  

## 2012-09-22 LAB — CULTURE, BLOOD (ROUTINE X 2)

## 2012-09-28 ENCOUNTER — Ambulatory Visit: Payer: Self-pay | Admitting: Neurology

## 2012-09-28 ENCOUNTER — Encounter: Payer: Self-pay | Admitting: Neurology

## 2012-09-28 DIAGNOSIS — L89309 Pressure ulcer of unspecified buttock, unspecified stage: Secondary | ICD-10-CM | POA: Insufficient documentation

## 2012-09-28 DIAGNOSIS — M62838 Other muscle spasm: Secondary | ICD-10-CM | POA: Insufficient documentation

## 2012-09-28 DIAGNOSIS — E559 Vitamin D deficiency, unspecified: Secondary | ICD-10-CM | POA: Insufficient documentation

## 2012-09-28 DIAGNOSIS — M25559 Pain in unspecified hip: Secondary | ICD-10-CM | POA: Insufficient documentation

## 2012-10-12 ENCOUNTER — Encounter (HOSPITAL_COMMUNITY): Payer: Self-pay | Admitting: *Deleted

## 2012-10-12 ENCOUNTER — Emergency Department (HOSPITAL_COMMUNITY): Payer: BC Managed Care – PPO

## 2012-10-12 ENCOUNTER — Inpatient Hospital Stay (HOSPITAL_COMMUNITY)
Admission: EM | Admit: 2012-10-12 | Discharge: 2012-10-22 | DRG: 581 | Disposition: A | Discharge status: Skilled Nursing Facility | Payer: BC Managed Care – PPO | Attending: Internal Medicine | Admitting: Internal Medicine

## 2012-10-12 DIAGNOSIS — E46 Unspecified protein-calorie malnutrition: Secondary | ICD-10-CM | POA: Diagnosis present

## 2012-10-12 DIAGNOSIS — E876 Hypokalemia: Secondary | ICD-10-CM | POA: Diagnosis present

## 2012-10-12 DIAGNOSIS — Z681 Body mass index (BMI) 19 or less, adult: Secondary | ICD-10-CM

## 2012-10-12 DIAGNOSIS — K5641 Fecal impaction: Secondary | ICD-10-CM

## 2012-10-12 DIAGNOSIS — R6510 Systemic inflammatory response syndrome (SIRS) of non-infectious origin without acute organ dysfunction: Secondary | ICD-10-CM

## 2012-10-12 DIAGNOSIS — L89213 Pressure ulcer of right hip, stage 3: Secondary | ICD-10-CM

## 2012-10-12 DIAGNOSIS — R000 Tachycardia, unspecified: Secondary | ICD-10-CM

## 2012-10-12 DIAGNOSIS — Z7401 Bed confinement status: Secondary | ICD-10-CM

## 2012-10-12 DIAGNOSIS — A419 Sepsis, unspecified organism: Principal | ICD-10-CM | POA: Diagnosis present

## 2012-10-12 DIAGNOSIS — L89109 Pressure ulcer of unspecified part of back, unspecified stage: Secondary | ICD-10-CM | POA: Diagnosis present

## 2012-10-12 DIAGNOSIS — E860 Dehydration: Secondary | ICD-10-CM

## 2012-10-12 DIAGNOSIS — D649 Anemia, unspecified: Secondary | ICD-10-CM | POA: Diagnosis present

## 2012-10-12 DIAGNOSIS — I959 Hypotension, unspecified: Secondary | ICD-10-CM

## 2012-10-12 DIAGNOSIS — E559 Vitamin D deficiency, unspecified: Secondary | ICD-10-CM

## 2012-10-12 DIAGNOSIS — D473 Essential (hemorrhagic) thrombocythemia: Secondary | ICD-10-CM | POA: Diagnosis present

## 2012-10-12 DIAGNOSIS — R509 Fever, unspecified: Secondary | ICD-10-CM

## 2012-10-12 DIAGNOSIS — T07XXXA Unspecified multiple injuries, initial encounter: Secondary | ICD-10-CM | POA: Diagnosis present

## 2012-10-12 DIAGNOSIS — G35 Multiple sclerosis: Secondary | ICD-10-CM | POA: Diagnosis present

## 2012-10-12 DIAGNOSIS — E871 Hypo-osmolality and hyponatremia: Secondary | ICD-10-CM | POA: Diagnosis present

## 2012-10-12 DIAGNOSIS — N390 Urinary tract infection, site not specified: Secondary | ICD-10-CM | POA: Diagnosis present

## 2012-10-12 DIAGNOSIS — M24569 Contracture, unspecified knee: Secondary | ICD-10-CM | POA: Diagnosis present

## 2012-10-12 DIAGNOSIS — D72829 Elevated white blood cell count, unspecified: Secondary | ICD-10-CM

## 2012-10-12 DIAGNOSIS — E861 Hypovolemia: Secondary | ICD-10-CM | POA: Diagnosis present

## 2012-10-12 DIAGNOSIS — L8994 Pressure ulcer of unspecified site, stage 4: Secondary | ICD-10-CM | POA: Diagnosis present

## 2012-10-12 DIAGNOSIS — D638 Anemia in other chronic diseases classified elsewhere: Secondary | ICD-10-CM | POA: Diagnosis present

## 2012-10-12 DIAGNOSIS — L89309 Pressure ulcer of unspecified buttock, unspecified stage: Secondary | ICD-10-CM

## 2012-10-12 DIAGNOSIS — M6282 Rhabdomyolysis: Secondary | ICD-10-CM

## 2012-10-12 DIAGNOSIS — G35D Multiple sclerosis, unspecified: Secondary | ICD-10-CM | POA: Diagnosis present

## 2012-10-12 DIAGNOSIS — M62838 Other muscle spasm: Secondary | ICD-10-CM

## 2012-10-12 LAB — CBC WITH DIFFERENTIAL/PLATELET
Basophils Absolute: 0.1 K/uL (ref 0.0–0.1)
Basophils Relative: 0 % (ref 0–1)
Eosinophils Absolute: 0.2 K/uL (ref 0.0–0.7)
Eosinophils Relative: 1 % (ref 0–5)
HCT: 33 % — ABNORMAL LOW (ref 39.0–52.0)
Hemoglobin: 10.8 g/dL — ABNORMAL LOW (ref 13.0–17.0)
Lymphocytes Relative: 9 % — ABNORMAL LOW (ref 12–46)
Lymphs Abs: 1.6 K/uL (ref 0.7–4.0)
MCH: 24.4 pg — ABNORMAL LOW (ref 26.0–34.0)
MCHC: 32.7 g/dL (ref 30.0–36.0)
MCV: 74.7 fL — ABNORMAL LOW (ref 78.0–100.0)
Monocytes Absolute: 1.4 K/uL — ABNORMAL HIGH (ref 0.1–1.0)
Monocytes Relative: 8 % (ref 3–12)
Neutro Abs: 14.4 K/uL — ABNORMAL HIGH (ref 1.7–7.7)
Neutrophils Relative %: 82 % — ABNORMAL HIGH (ref 43–77)
Platelets: 829 K/uL — ABNORMAL HIGH (ref 150–400)
RBC: 4.42 MIL/uL (ref 4.22–5.81)
RDW: 15.7 % — ABNORMAL HIGH (ref 11.5–15.5)
WBC: 17.7 K/uL — ABNORMAL HIGH (ref 4.0–10.5)

## 2012-10-12 LAB — COMPREHENSIVE METABOLIC PANEL
ALT: 9 U/L (ref 0–53)
AST: 24 U/L (ref 0–37)
Alkaline Phosphatase: 85 U/L (ref 39–117)
CO2: 24 mEq/L (ref 19–32)
Chloride: 93 mEq/L — ABNORMAL LOW (ref 96–112)
Creatinine, Ser: 0.51 mg/dL (ref 0.50–1.35)
GFR calc non Af Amer: >90 mL/min (ref >90)
Potassium: 4.7 mEq/L (ref 3.5–5.1)
Total Bilirubin: 1 mg/dL (ref 0.3–1.2)

## 2012-10-12 LAB — CG4 I-STAT (LACTIC ACID): Lactic Acid, Venous: 1.8 mmol/L (ref 0.5–2.2)

## 2012-10-12 MED ORDER — ACETAMINOPHEN 325 MG PO TABS
650.0000 mg | ORAL_TABLET | Freq: Once | ORAL | Status: AC
  Administered 2012-10-12: 650 mg via ORAL
  Filled 2012-10-12: qty 2

## 2012-10-12 MED ORDER — VANCOMYCIN HCL IN DEXTROSE 1-5 GM/200ML-% IV SOLN
1000.0000 mg | Freq: Once | INTRAVENOUS | Status: AC
  Administered 2012-10-13: 1000 mg via INTRAVENOUS
  Filled 2012-10-12: qty 200

## 2012-10-12 MED ORDER — DEXTROSE 5 % IV SOLN
1.0000 g | Freq: Once | INTRAVENOUS | Status: AC
  Administered 2012-10-12: 1 g via INTRAVENOUS
  Filled 2012-10-12: qty 10

## 2012-10-12 MED ORDER — SODIUM CHLORIDE 0.9 % IV BOLUS (SEPSIS)
1000.0000 mL | Freq: Once | INTRAVENOUS | Status: AC
  Administered 2012-10-12: 1000 mL via INTRAVENOUS

## 2012-10-12 NOTE — ED Notes (Signed)
Patient transported to X-ray 

## 2012-10-12 NOTE — ED Notes (Signed)
Pt still with no output

## 2012-10-12 NOTE — ED Notes (Signed)
ZOX:[id number]<AV> Expected date:<BR> Expected time:<BR> Means of arrival:<BR> Comments:<BR> EMS

## 2012-10-12 NOTE — ED Notes (Signed)
Pt sts that he would like to try to use the urinal before staff does an In and Out cath.

## 2012-10-12 NOTE — ED Notes (Signed)
Pt arrives from home by EMS, pt had physical therapy today and therapist became concerned with their findings and called pts doctor and had pt sent to ER. Pt has bed sores to bottom.

## 2012-10-12 NOTE — ED Notes (Signed)
Pt waiting for IV team to start IV.

## 2012-10-12 NOTE — ED Provider Notes (Addendum)
History     CSN: [id number]  Arrival date & time 10/12/12  1831   First MD Initiated Contact with Patient 10/12/12 1836      Chief Complaint  Patient presents with  . Weakness    (Consider location/radiation/quality/duration/timing/severity/associated sxs/prior treatment) Patient is a 52 y.o. male presenting with weakness. The history is provided by the patient.  Weakness This is a recurrent problem. Episode onset: unknown. The problem occurs constantly. The problem has been gradually worsening. Pertinent negatives include no chest pain, no abdominal pain and no shortness of breath. Associated symptoms comments: Home health nurse today was concerned about him and thought he may be dehydrated.  Pt denies any pain or vomiting.  States his decubitus ulcers are better than prior.  States had a fever today but unknown if prior fevers.  States over the last week urine looked different.. Nothing aggravates the symptoms. Nothing relieves the symptoms. He has tried nothing for the symptoms. The treatment provided no relief.    Past Medical History  Diagnosis Date  . Multiple sclerosis   . Pressure ulcer, buttock(707.05)   . Pain in joint, pelvic region and thigh   . Spasm of muscle   . Unspecified vitamin D deficiency   . Disturbance of skin sensation     History reviewed. No pertinent past surgical history.  Family History  Problem Relation Age of Onset  . Hypertension Mother   . Dementia Mother   . Hypertension Father   . Heart disease Father     History  Substance Use Topics  . Smoking status: Never Smoker   . Smokeless tobacco: Never Used  . Alcohol Use: No      Review of Systems  Constitutional: Positive for fever.  Respiratory: Negative for shortness of breath.   Cardiovascular: Negative for chest pain.  Gastrointestinal: Negative for nausea, vomiting, abdominal pain and diarrhea.  Genitourinary: Negative for dysuria and hematuria.  Neurological: Positive for  weakness.  All other systems reviewed and are negative.    Allergies  Review of patient's allergies indicates no known allergies.  Home Medications   Current Outpatient Rx  Name  Route  Sig  Dispense  Refill  . amantadine (SYMMETREL) 100 MG capsule   Oral   Take 100 mg by mouth 2 (two) times daily.          . calcium carbonate (OS-CAL - DOSED IN MG OF ELEMENTAL CALCIUM) 1250 MG tablet   Oral   Take 1 tablet by mouth daily.         . cefUROXime (CEFTIN) 500 MG tablet   Oral   Take 1 tablet (500 mg total) by mouth 2 (two) times daily with a meal.   3 tablet   0   . collagenase (SANTYL) ointment   Topical   Apply topically daily. Apply to Unstageable pressure ulcer on coccyx once daily.  Apply 1/8 inch thick and top with saline dressing.  Change daily and PRN for soiling or loosening of dressing x 21 days.   90 g   1   . dantrolene (DANTRIUM) 25 MG capsule   Oral   Take 25 mg by mouth 3 (three) times daily.         Marland Kitchen docusate sodium 100 MG CAPS   Oral   Take 100 mg by mouth 2 (two) times daily.         Marland Kitchen ezetimibe (ZETIA) 10 MG tablet   Oral   Take 10 mg by mouth daily.         Marland Kitchen  feeding supplement (ENSURE COMPLETE) LIQD   Oral   Take 237 mLs by mouth 3 (three) times daily between meals.         . gabapentin (NEURONTIN) 600 MG tablet   Oral   Take 600 mg by mouth 4 (four) times daily.         Marland Kitchen HYDROcodone-acetaminophen (NORCO/VICODIN) 5-325 MG per tablet   Oral   Take 1-2 tablets by mouth every 8 (eight) hours as needed for pain.   30 tablet   0   . interferon beta-1b (BETASERON) 0.3 MG injection   Subcutaneous   Inject 0.3 mg into the skin every other day.          . methylPREDNISolone sodium succinate (SOLU-MEDROL) 1000 MG injection   Intravenous   Inject 1,000 mg into the vein once.         . modafinil (PROVIGIL) 200 MG tablet   Oral   Take 200 mg by mouth 2 (two) times daily.          . Multiple Vitamin (MULTIVITAMIN WITH  MINERALS) TABS   Oral   Take 1 tablet by mouth daily.         . nutrition supplement (JUVEN) PACK   Oral   Take 1 packet by mouth 2 (two) times daily after a meal.         . senna (SENOKOT) 8.6 MG TABS   Oral   Take 1 tablet (8.6 mg total) by mouth 2 (two) times daily.           SpO2 99%  Physical Exam  Nursing note and vitals reviewed. Constitutional: He is oriented to person, place, and time. He appears well-developed and well-nourished. No distress.  HENT:  Head: Normocephalic and atraumatic.  Mouth/Throat: Oropharynx is clear and moist. Mucous membranes are dry.  Eyes: Conjunctivae and EOM are normal. Pupils are equal, round, and reactive to light.  Neck: Normal range of motion. Neck supple.  Cardiovascular: Regular rhythm and intact distal pulses.  Tachycardia present.   No murmur heard. Pulmonary/Chest: Effort normal and breath sounds normal. No respiratory distress. He has no wheezes. He has no rales.  Abdominal: Soft. He exhibits no distension. There is no tenderness. There is no rebound and no guarding.  Musculoskeletal: Normal range of motion. He exhibits tenderness. He exhibits no edema.  Contractures in bilateral lower ext.  Decubitus ulcers over bilateral hips.  Right hip is baseball sized with mild purulent discharge and odor.  Neurological: He is alert and oriented to person, place, and time.  Skin: Skin is warm and dry. No rash noted. No erythema.  Psychiatric: He has a normal mood and affect. His behavior is normal.    ED Course  Procedures (including critical care time)  Labs Reviewed  CBC WITH DIFFERENTIAL - Abnormal; Notable for the following:    WBC 17.7 (*)    Hemoglobin 10.8 (*)    HCT 33.0 (*)    MCV 74.7 (*)    MCH 24.4 (*)    RDW 15.7 (*)    Platelets 829 (*)    Neutrophils Relative % 82 (*)    Neutro Abs 14.4 (*)    Lymphocytes Relative 9 (*)    Monocytes Absolute 1.4 (*)    All other components within normal limits  COMPREHENSIVE  METABOLIC PANEL - Abnormal; Notable for the following:    Sodium 133 (*)    Chloride 93 (*)    Total Protein 9.4 (*)    Albumin  2.6 (*)    All other components within normal limits  URINALYSIS, ROUTINE W REFLEX MICROSCOPIC  CG4 I-STAT (LACTIC ACID)   Dg Chest Port 1 View  10/12/2012   *RADIOLOGY REPORT*  Clinical Data: Rapid heart rate.  PORTABLE CHEST - 1 VIEW  Comparison: Fourth 32,014  Findings: Patient in  atypical positioning due to hip pain.  Stable cardiac silhouette.  Lungs are hyperinflated.  There is linear scarring at the right lung apex not changed from prior.  No focal consolidation.  No pneumothorax.  IMPRESSION: 1.  No interval change. 2.  Hyperinflated lungs with pulmonary scarring at the right lung apex.   Original Report Authenticated By: Genevive Bi, M.D.     Date: 10/12/2012  Rate: 135  Rhythm: sinus tachycardia  QRS Axis: normal  Intervals: normal  ST/T Wave abnormalities: normal  Conduction Disutrbances:none  Narrative Interpretation:   Old EKG Reviewed: unchanged    1. Fever   2. Dehydration   3. Decubitus ulcer of hip, right, stage III   4. Tachycardia       MDM   Patient with multiple medical problems in clotting severe MS who is bedridden and since then do to concern for dehydration and infection. The patient has no complaints but states he has not been ED and is much as normal. He has a temperature of 101.9 here but denies cough, shortness of breath, abdominal pain or vomiting. He does have decubitus ulcers on the left and right hip but more significant on the right hip which she says actually looks better. He denies any dysuria but states his urine did look abnormal this last week. He did not have a catheter. The patient is tachycardic to 130s to 140s here. He has a normal blood pressure but does appear dehydrated on exam.  CBC, CMP, UA, lactic acid pending. Patient is febrile to 101.9 here and given Tylenol. IV fluids started  11:01 PM New  leukocytosis of 17,000 today and CMP within normal limits. Lactic acid normal. UA still pending is unable to Patient and he has not urinated. Caregiver is now at bedside and states that she measured his wounds and they are getting deeper and larger with more drainage. Unable to get a pelvis film due to patient's contractures and deformity. However his source could be either UTI versus wound.  Will start Rocephin and vancomycin. After 2 L of IV fluid her rate improved to the 120s. Blood pressure is stable. Will admit for further care     Gwyneth Sprout, MD 10/12/12 2302  Gwyneth Sprout, MD 10/13/12 0001

## 2012-10-13 DIAGNOSIS — L89109 Pressure ulcer of unspecified part of back, unspecified stage: Secondary | ICD-10-CM

## 2012-10-13 DIAGNOSIS — D649 Anemia, unspecified: Secondary | ICD-10-CM

## 2012-10-13 DIAGNOSIS — G35 Multiple sclerosis: Secondary | ICD-10-CM

## 2012-10-13 DIAGNOSIS — L8993 Pressure ulcer of unspecified site, stage 3: Secondary | ICD-10-CM

## 2012-10-13 DIAGNOSIS — E860 Dehydration: Secondary | ICD-10-CM

## 2012-10-13 DIAGNOSIS — D72829 Elevated white blood cell count, unspecified: Secondary | ICD-10-CM

## 2012-10-13 DIAGNOSIS — L89209 Pressure ulcer of unspecified hip, unspecified stage: Secondary | ICD-10-CM

## 2012-10-13 DIAGNOSIS — I959 Hypotension, unspecified: Secondary | ICD-10-CM

## 2012-10-13 DIAGNOSIS — E876 Hypokalemia: Secondary | ICD-10-CM

## 2012-10-13 DIAGNOSIS — R509 Fever, unspecified: Secondary | ICD-10-CM

## 2012-10-13 DIAGNOSIS — T148XXA Other injury of unspecified body region, initial encounter: Secondary | ICD-10-CM

## 2012-10-13 DIAGNOSIS — L89309 Pressure ulcer of unspecified buttock, unspecified stage: Secondary | ICD-10-CM

## 2012-10-13 LAB — CBC WITH DIFFERENTIAL/PLATELET
Basophils Absolute: 0.1 10*3/uL (ref 0.0–0.1)
Basophils Relative: 0 % (ref 0–1)
Eosinophils Absolute: 0.3 10*3/uL (ref 0.0–0.7)
Hemoglobin: 7.9 g/dL — ABNORMAL LOW (ref 13.0–17.0)
MCH: 23.7 pg — ABNORMAL LOW (ref 26.0–34.0)
MCHC: 31.9 g/dL (ref 30.0–36.0)
Monocytes Relative: 10 % (ref 3–12)
Neutro Abs: 14.6 10*3/uL — ABNORMAL HIGH (ref 1.7–7.7)
Neutrophils Relative %: 81 % — ABNORMAL HIGH (ref 43–77)
Platelets: 624 10*3/uL — ABNORMAL HIGH (ref 150–400)

## 2012-10-13 LAB — COMPREHENSIVE METABOLIC PANEL
ALT: 7 U/L (ref 0–53)
CO2: 22 mEq/L (ref 19–32)
Calcium: 8.2 mg/dL — ABNORMAL LOW (ref 8.4–10.5)
Chloride: 98 mEq/L (ref 96–112)
Creatinine, Ser: 0.44 mg/dL — ABNORMAL LOW (ref 0.50–1.35)
GFR calc Af Amer: >90 mL/min (ref >90)
GFR calc non Af Amer: >90 mL/min (ref >90)
Glucose, Bld: 108 mg/dL — ABNORMAL HIGH (ref 70–99)
Sodium: 132 mEq/L — ABNORMAL LOW (ref 135–145)
Total Bilirubin: 0.7 mg/dL (ref 0.3–1.2)

## 2012-10-13 LAB — URINALYSIS, ROUTINE W REFLEX MICROSCOPIC
Bilirubin Urine: NEGATIVE
Glucose, UA: NEGATIVE mg/dL
Ketones, ur: 15 mg/dL — AB
Protein, ur: NEGATIVE mg/dL
Urobilinogen, UA: 1 mg/dL (ref 0.0–1.0)

## 2012-10-13 MED ORDER — EZETIMIBE 10 MG PO TABS
10.0000 mg | ORAL_TABLET | Freq: Every day | ORAL | Status: DC
  Administered 2012-10-13 – 2012-10-22 (×9): 10 mg via ORAL
  Filled 2012-10-13 (×10): qty 1

## 2012-10-13 MED ORDER — MODAFINIL 200 MG PO TABS
200.0000 mg | ORAL_TABLET | Freq: Two times a day (BID) | ORAL | Status: DC
  Administered 2012-10-13 – 2012-10-22 (×20): 200 mg via ORAL
  Filled 2012-10-13 (×20): qty 1

## 2012-10-13 MED ORDER — TIZANIDINE HCL 4 MG PO TABS
4.0000 mg | ORAL_TABLET | Freq: Three times a day (TID) | ORAL | Status: DC
  Administered 2012-10-13 – 2012-10-22 (×36): 4 mg via ORAL
  Filled 2012-10-13 (×42): qty 1

## 2012-10-13 MED ORDER — POTASSIUM CHLORIDE CRYS ER 20 MEQ PO TBCR
40.0000 meq | EXTENDED_RELEASE_TABLET | Freq: Once | ORAL | Status: AC
  Administered 2012-10-13: 40 meq via ORAL
  Filled 2012-10-13: qty 2

## 2012-10-13 MED ORDER — INTERFERON BETA-1B 0.3 MG ~~LOC~~ KIT: 0.3000 mg | PACK | SUBCUTANEOUS | Status: DC

## 2012-10-13 MED ORDER — AMANTADINE HCL 100 MG PO CAPS
100.0000 mg | ORAL_CAPSULE | Freq: Two times a day (BID) | ORAL | Status: DC
  Administered 2012-10-13 – 2012-10-22 (×18): 100 mg via ORAL
  Filled 2012-10-13 (×23): qty 1

## 2012-10-13 MED ORDER — GABAPENTIN 300 MG PO CAPS
600.0000 mg | ORAL_CAPSULE | Freq: Four times a day (QID) | ORAL | Status: DC
  Administered 2012-10-13 – 2012-10-22 (×37): 600 mg via ORAL
  Filled 2012-10-13 (×40): qty 2

## 2012-10-13 MED ORDER — SODIUM CHLORIDE 0.9 % IV SOLN
INTRAVENOUS | Status: DC
  Administered 2012-10-13 – 2012-10-14 (×2): via INTRAVENOUS

## 2012-10-13 MED ORDER — HYDROCODONE-ACETAMINOPHEN 5-325 MG PO TABS
1.0000 | ORAL_TABLET | Freq: Three times a day (TID) | ORAL | Status: DC | PRN
  Administered 2012-10-13: 2 via ORAL
  Filled 2012-10-13: qty 2

## 2012-10-13 MED ORDER — ADULT MULTIVITAMIN W/MINERALS CH
1.0000 | ORAL_TABLET | Freq: Every day | ORAL | Status: DC
  Administered 2012-10-13 – 2012-10-22 (×9): 1 via ORAL
  Filled 2012-10-13 (×10): qty 1

## 2012-10-13 MED ORDER — DANTROLENE SODIUM 25 MG PO CAPS
25.0000 mg | ORAL_CAPSULE | Freq: Three times a day (TID) | ORAL | Status: DC
  Administered 2012-10-13 – 2012-10-22 (×28): 25 mg via ORAL
  Filled 2012-10-13 (×30): qty 1

## 2012-10-13 MED ORDER — ENSURE COMPLETE PO LIQD
237.0000 mL | Freq: Three times a day (TID) | ORAL | Status: DC
  Administered 2012-10-13 – 2012-10-22 (×24): 237 mL via ORAL
  Filled 2012-10-13: qty 237

## 2012-10-13 MED ORDER — LEVOFLOXACIN IN D5W 500 MG/100ML IV SOLN
500.0000 mg | INTRAVENOUS | Status: DC
  Administered 2012-10-13 – 2012-10-14 (×2): 500 mg via INTRAVENOUS
  Filled 2012-10-13 (×2): qty 100

## 2012-10-13 MED ORDER — VANCOMYCIN HCL 10 G IV SOLR
1250.0000 mg | Freq: Two times a day (BID) | INTRAVENOUS | Status: DC
  Administered 2012-10-13 – 2012-10-16 (×7): 1250 mg via INTRAVENOUS
  Filled 2012-10-13 (×10): qty 1250

## 2012-10-13 MED ORDER — JUVEN PO PACK
1.0000 | PACK | Freq: Two times a day (BID) | ORAL | Status: DC
  Administered 2012-10-13 – 2012-10-22 (×18): 1 via ORAL
  Filled 2012-10-13 (×22): qty 1

## 2012-10-13 MED ORDER — COLLAGENASE 250 UNIT/GM EX OINT
TOPICAL_OINTMENT | Freq: Every day | CUTANEOUS | Status: DC
  Administered 2012-10-13 – 2012-10-18 (×3): via TOPICAL
  Administered 2012-10-20: 1 via TOPICAL
  Administered 2012-10-21 – 2012-10-22 (×2): via TOPICAL
  Filled 2012-10-13 (×2): qty 30

## 2012-10-13 MED ORDER — HEPARIN SODIUM (PORCINE) 5000 UNIT/ML IJ SOLN
5000.0000 [IU] | Freq: Three times a day (TID) | INTRAMUSCULAR | Status: AC
  Administered 2012-10-13 – 2012-10-14 (×6): 5000 [IU] via SUBCUTANEOUS
  Filled 2012-10-13 (×7): qty 1

## 2012-10-13 NOTE — Progress Notes (Signed)
INITIAL NUTRITION ASSESSMENT  DOCUMENTATION CODES Per approved criteria  -Underweight   INTERVENTION: Continue Ensure Complete po TID, each supplement provides 350 kcal and 13 grams of protein. Continue Juven BID Continue Multivitamin with minerals daily Encourage PO intake of 3 meals daily and all supplements Recommend 1000 mg of Vitamin C BID for 14 days Recommend 50 mg elemental Zinc daily Recommend 10,000 IU of Vitamin A daily to replete, if pt is deficient   NUTRITION DIAGNOSIS: Increased nutrient needs related to underweight and wound healing as evidenced by pt with multiple stage 3 pressure ulcers and BMI of 16.8.   Goal: Pt to meet >/= 90% of their estimated nutrition needs  Monitor:  PO intake Weight Wounds Labs  Reason for Assessment: Malnutrition Screening Tool, Score of 4  52 y.o. male  Admitting Dx: Dehydration, questionable sepsis  ASSESSMENT: 52 y.o. year old male with multiple medical problems including multiple sclerosis, lower extremity contractures, sacral decubitus and hip pressure ulcers, presenting with dehydration, questionable sepsis.  Pt reports that his appetite is normal and he ate 100% of his breakfast. Pt states he usually eats a late breakfast and a late dinner and drinks 3 Ensure supplements daily as well as 2 Juven supplements. Pt states he usually weighs between 140 and 150 lbs but, in late 2011 his usual weight was 193 lbs. Pt reports that he hasn't been taking a multivitamin recently. Discussed sources of protein and encouraged protein-rich foods daily. Discussed ways to help pt gain weight; pt plans to increase calories by drinking one cup of chocolate mild daily (pt usually drinks just water) and adding one snack daily around lunch time.  Height: Ht Readings from Last 1 Encounters:  10/13/12 6\' 3"  (1.905 m)    Weight: Wt Readings from Last 1 Encounters:  10/13/12 134 lb 4.2 oz (60.9 kg)    Ideal Body Weight: 196 lbs  % Ideal Body  Weight: 68%  Wt Readings from Last 10 Encounters:  10/13/12 134 lb 4.2 oz (60.9 kg)  09/17/12 136 lb 11 oz (62 kg)  07/26/11 141 lb 12.1 oz (64.3 kg)    Usual Body Weight: 140 lbs  % Usual Body Weight: 96%  BMI:  Body mass index is 16.78 kg/(m^2).  Estimated Nutritional Needs: Kcal: 1830-2130  Protein: 91-110 grams Fluid: 2.1 L  Skin: Stage 3 pressure ulcer on hip, stage 3 pressure ulcer on sacrum  Diet Order: General  EDUCATION NEEDS: -Education needs addressed   Intake/Output Summary (Last 24 hours) at 10/13/12 1144 Last data filed at 10/13/12 1100  Gross per 24 hour  Intake 1093.75 ml  Output    445 ml  Net 648.75 ml    Last BM: 5/28  Labs:   Recent Labs Lab 10/12/12 1904 10/13/12 0538  NA 133* 132*  K 4.7 3.4*  CL 93* 98  CO2 24 22  BUN 8 7  CREATININE 0.51 0.44*  CALCIUM 9.4 8.2*  GLUCOSE 96 108*    CBG (last 3)  No results found for this basename: GLUCAP,  in the last 72 hours  Scheduled Meds: . amantadine  100 mg Oral BID  . collagenase   Topical Daily  . dantrolene  25 mg Oral TID  . ezetimibe  10 mg Oral Daily  . feeding supplement  237 mL Oral TID BM  . gabapentin  600 mg Oral QID  . heparin  5,000 Units Subcutaneous Q8H  . Interferon Beta-1b  0.3 mg Subcutaneous QODAY  . levofloxacin (LEVAQUIN) IV  500 mg Intravenous Q24H  . modafinil  200 mg Oral BID  . multivitamin with minerals  1 tablet Oral Daily  . nutrition supplement  1 packet Oral BID PC  . tiZANidine  4 mg Oral TID PC & HS  . vancomycin  1,250 mg Intravenous Q12H    Continuous Infusions: . sodium chloride 75 mL/hr at 10/13/12 0105    Past Medical History  Diagnosis Date  . Multiple sclerosis   . Pressure ulcer, buttock(707.05)   . Pain in joint, pelvic region and thigh   . Spasm of muscle   . Unspecified vitamin D deficiency   . Disturbance of skin sensation     History reviewed. No pertinent past surgical history.  Ian Malkin RD, LDN Inpatient  Clinical Dietitian Pager: [phone] After Hours Pager: [phone]

## 2012-10-13 NOTE — Consult Note (Signed)
Reason for Consult: Decubitus Referring Physician: Doree Albee, MD   [name] Mccoy is an 52 y.o. male.  HPI: History of Present Illness:This is a 52 y.o. year old male with multiple medical problems including multiple sclerosis, lower extremity contractures, sacral decubitus and hip pressure ulcers, presenting with dehydration, questionable sepsis. Patient currently lives at home with a in-house caregiver. Per report, physical therapy came by to see the patient while the caregiver was not there was some concern about the patient being dehydrated as he had elevated heart rate. Per the caregiver, his home health nurse was made aware of the situation and was he was recommended to call EMS because of the elevated heart rate. Patient states that he has felt overall asymptomatic over the past 24 hours. Patient currently denies any dysuria, nausea, abdominal pain, headache. IN ER his HR was up to the 150's and temp up to 102, no recorded temp or complaints at home.  He was admitted with fever, tachycardia, and probable sepsis. We are ask to see and evaluate for decubitus.   He is bed bound, with contractures, knees to chest.  He says he eats well, or thinks he does.  Care is by a caregiver at home and he has no family in the area.   Past Medical History  Diagnosis Date  . Multiple sclerosis   . Pressure ulcer, buttock(707.05)   . Pain in joint, pelvic region and thigh   . Spasm of muscle   . Unspecified vitamin D deficiency   . Disturbance of skin sensation     History reviewed. No pertinent past surgical history.  Family History  Problem Relation Age of Onset  . Hypertension Mother   . Dementia Mother   . Hypertension Father   . Heart disease Father     Social History:   Tobacco: none ETOH:  None since MS symptoms 30 plus years ago. DRugs:  none  Allergies: No Known Allergies  Medications:  Prior to Admission:  Prescriptions prior to admission  Medication Sig Dispense Refill   . amantadine (SYMMETREL) 100 MG capsule Take 100 mg by mouth 2 (two) times daily.       . collagenase (SANTYL) ointment Apply topically daily. Apply to Unstageable pressure ulcer on coccyx once daily.  Apply 1/8 inch thick and top with saline dressing.  Change daily and PRN for soiling or loosening of dressing x 21 days.  90 g  1  . dantrolene (DANTRIUM) 25 MG capsule Take 25 mg by mouth 3 (three) times daily.      Marland Kitchen ezetimibe (ZETIA) 10 MG tablet Take 10 mg by mouth daily.      . feeding supplement (ENSURE COMPLETE) LIQD Take 237 mLs by mouth 3 (three) times daily between meals.      . gabapentin (NEURONTIN) 600 MG tablet Take 600 mg by mouth 4 (four) times daily.      Marland Kitchen HYDROcodone-acetaminophen (NORCO/VICODIN) 5-325 MG per tablet Take 1-2 tablets by mouth every 8 (eight) hours as needed for pain.  30 tablet  0  . interferon beta-1b (BETASERON) 0.3 MG injection Inject 0.3 mg into the skin every other day.       . modafinil (PROVIGIL) 200 MG tablet Take 200 mg by mouth 2 (two) times daily.       . Multiple Vitamin (MULTIVITAMIN WITH MINERALS) TABS Take 1 tablet by mouth daily.      . nutrition supplement (JUVEN) PACK Take 1 packet by mouth 2 (two) times daily after  a meal.      . tiZANidine (ZANAFLEX) 4 MG tablet Take 4 mg by mouth 4 (four) times daily - after meals and at bedtime.       Scheduled: . amantadine  100 mg Oral BID  . collagenase   Topical Daily  . dantrolene  25 mg Oral TID  . ezetimibe  10 mg Oral Daily  . feeding supplement  237 mL Oral TID BM  . gabapentin  600 mg Oral QID  . heparin  5,000 Units Subcutaneous Q8H  . Interferon Beta-1b  0.3 mg Subcutaneous QODAY  . levofloxacin (LEVAQUIN) IV  500 mg Intravenous Q24H  . modafinil  200 mg Oral BID  . multivitamin with minerals  1 tablet Oral Daily  . nutrition supplement  1 packet Oral BID PC  . potassium chloride  40 mEq Oral Once  . tiZANidine  4 mg Oral TID PC & HS  . vancomycin  1,250 mg Intravenous Q12H    Continuous: . sodium chloride 75 mL/hr at 10/13/12 0105   QIO:[id number] Anti-infectives   Start     Dose/Rate Route Frequency Ordered Stop   10/13/12 1000  vancomycin (VANCOCIN) 1,250 mg in sodium chloride 0.9 % 250 mL IVPB     1,250 mg 166.7 mL/hr over 90 Minutes Intravenous Every 12 hours 10/13/12 0120     10/13/12 0115  levofloxacin (LEVAQUIN) IVPB 500 mg     500 mg 100 mL/hr over 60 Minutes Intravenous Every 24 hours 10/13/12 0104     10/12/12 2315  vancomycin (VANCOCIN) IVPB 1000 mg/200 mL premix     1,000 mg 200 mL/hr over 60 Minutes Intravenous  Once 10/12/12 2301 10/13/12 0112   10/12/12 2315  cefTRIAXone (ROCEPHIN) 1 g in dextrose 5 % 50 mL IVPB     1 g 100 mL/hr over 30 Minutes Intravenous  Once 10/12/12 2301 10/13/12 0039      Results for orders placed during the hospital encounter of 10/12/12 (from the past 48 hour(s))  CBC WITH DIFFERENTIAL     Status: Abnormal   Collection Time    10/12/12  7:04 PM      Result Value Range   WBC 17.7 (*) 4.0 - 10.5 K/uL   RBC 4.42  4.22 - 5.81 MIL/uL   Hemoglobin 10.8 (*) 13.0 - 17.0 g/dL   HCT 40.3 (*) 47.4 - 25.9 %   MCV 74.7 (*) 78.0 - 100.0 fL   MCH 24.4 (*) 26.0 - 34.0 pg   MCHC 32.7  30.0 - 36.0 g/dL   RDW 56.3 (*) 87.5 - 64.3 %   Platelets 829 (*) 150 - 400 K/uL   Neutrophils Relative % 82 (*) 43 - 77 %   Neutro Abs 14.4 (*) 1.7 - 7.7 K/uL   Lymphocytes Relative 9 (*) 12 - 46 %   Lymphs Abs 1.6  0.7 - 4.0 K/uL   Monocytes Relative 8  3 - 12 %   Monocytes Absolute 1.4 (*) 0.1 - 1.0 K/uL   Eosinophils Relative 1  0 - 5 %   Eosinophils Absolute 0.2  0.0 - 0.7 K/uL   Basophils Relative 0  0 - 1 %   Basophils Absolute 0.1  0.0 - 0.1 K/uL  COMPREHENSIVE METABOLIC PANEL     Status: Abnormal   Collection Time    10/12/12  7:04 PM      Result Value Range   Sodium 133 (*) 135 - 145 mEq/L   Potassium 4.7  3.5 - 5.1  mEq/L   Chloride 93 (*) 96 - 112 mEq/L   CO2 24  19 - 32 mEq/L   Glucose, Bld 96  70 -  99 mg/dL   BUN 8  6 - 23 mg/dL   Creatinine, Ser 1.61  0.50 - 1.35 mg/dL   Calcium 9.4  8.4 - 09.6 mg/dL   Total Protein 9.4 (*) 6.0 - 8.3 g/dL   Albumin 2.6 (*) 3.5 - 5.2 g/dL   AST 24  0 - 37 U/L   ALT 9  0 - 53 U/L   Alkaline Phosphatase 85  39 - 117 U/L   Total Bilirubin 1.0  0.3 - 1.2 mg/dL   GFR calc non Af Amer >90  >90 mL/min   GFR calc Af Amer >90  >90 mL/min   Comment:            The eGFR has been calculated     using the CKD EPI equation.     This calculation has not been     validated in all clinical     situations.     eGFR's persistently     <90 mL/min signify     possible Chronic Kidney Disease.  CG4 I-STAT (LACTIC ACID)     Status: None   Collection Time    10/12/12  7:31 PM      Result Value Range   Lactic Acid, Venous 1.80  0.5 - 2.2 mmol/L  URINALYSIS, ROUTINE W REFLEX MICROSCOPIC     Status: Abnormal   Collection Time    10/13/12 12:03 AM      Result Value Range   Color, Urine YELLOW  YELLOW   APPearance CLOUDY (*) CLEAR   Specific Gravity, Urine 1.013  1.005 - 1.030   pH 6.0  5.0 - 8.0   Glucose, UA NEGATIVE  NEGATIVE mg/dL   Hgb urine dipstick MODERATE (*) NEGATIVE   Bilirubin Urine NEGATIVE  NEGATIVE   Ketones, ur 15 (*) NEGATIVE mg/dL   Protein, ur NEGATIVE  NEGATIVE mg/dL   Urobilinogen, UA 1.0  0.0 - 1.0 mg/dL   Nitrite NEGATIVE  NEGATIVE   Leukocytes, UA MODERATE (*) NEGATIVE  URINE MICROSCOPIC-ADD ON     Status: Abnormal   Collection Time    10/13/12 12:03 AM      Result Value Range   Squamous Epithelial / LPF FEW (*) RARE   Comment: FEW     FEW   WBC, UA 7-10  <3 WBC/hpf   Comment: 7-10     7-10   RBC / HPF 7-10  <3 RBC/hpf   Comment: 7-10     7-10  MRSA PCR SCREENING     Status: None   Collection Time    10/13/12  3:12 AM      Result Value Range   MRSA by PCR NEGATIVE  NEGATIVE   Comment:            The GeneXpert MRSA Assay (FDA     approved for NASAL specimens     only), is one component of a     comprehensive MRSA  colonization     surveillance program. It is not     intended to diagnose MRSA     infection nor to guide or     monitor treatment for     MRSA infections.  COMPREHENSIVE METABOLIC PANEL     Status: Abnormal   Collection Time    10/13/12  5:38 AM      Result  Value Range   Sodium 132 (*) 135 - 145 mEq/L   Potassium 3.4 (*) 3.5 - 5.1 mEq/L   Comment: RESULT REPEATED AND VERIFIED     DELTA CHECK NOTED   Chloride 98  96 - 112 mEq/L   CO2 22  19 - 32 mEq/L   Glucose, Bld 108 (*) 70 - 99 mg/dL   BUN 7  6 - 23 mg/dL   Creatinine, Ser 6.21 (*) 0.50 - 1.35 mg/dL   Calcium 8.2 (*) 8.4 - 10.5 mg/dL   Total Protein 7.2  6.0 - 8.3 g/dL   Albumin 2.0 (*) 3.5 - 5.2 g/dL   AST 13  0 - 37 U/L   ALT 7  0 - 53 U/L   Alkaline Phosphatase 69  39 - 117 U/L   Total Bilirubin 0.7  0.3 - 1.2 mg/dL   GFR calc non Af Amer >90  >90 mL/min   GFR calc Af Amer >90  >90 mL/min   Comment:            The eGFR has been calculated     using the CKD EPI equation.     This calculation has not been     validated in all clinical     situations.     eGFR's persistently     <90 mL/min signify     possible Chronic Kidney Disease.  CBC WITH DIFFERENTIAL     Status: Abnormal   Collection Time    10/13/12  5:38 AM      Result Value Range   WBC 17.9 (*) 4.0 - 10.5 K/uL   RBC 3.34 (*) 4.22 - 5.81 MIL/uL   Hemoglobin 7.9 (*) 13.0 - 17.0 g/dL   Comment: DELTA CHECK NOTED     REPEATED TO VERIFY   HCT 24.8 (*) 39.0 - 52.0 %   MCV 74.3 (*) 78.0 - 100.0 fL   MCH 23.7 (*) 26.0 - 34.0 pg   MCHC 31.9  30.0 - 36.0 g/dL   RDW 30.8 (*) 65.7 - 84.6 %   Platelets 624 (*) 150 - 400 K/uL   Comment: DELTA CHECK NOTED     REPEATED TO VERIFY   Neutrophils Relative % 81 (*) 43 - 77 %   Neutro Abs 14.6 (*) 1.7 - 7.7 K/uL   Lymphocytes Relative 7 (*) 12 - 46 %   Lymphs Abs 1.2  0.7 - 4.0 K/uL   Monocytes Relative 10  3 - 12 %   Monocytes Absolute 1.8 (*) 0.1 - 1.0 K/uL   Eosinophils Relative 2  0 - 5 %   Eosinophils Absolute  0.3  0.0 - 0.7 K/uL   Basophils Relative 0  0 - 1 %   Basophils Absolute 0.1  0.0 - 0.1 K/uL    Dg Chest Port 1 View  10/12/2012   *RADIOLOGY REPORT*  Clinical Data: Rapid heart rate.  PORTABLE CHEST - 1 VIEW  Comparison: Fourth 32,014  Findings: Patient in  atypical positioning due to hip pain.  Stable cardiac silhouette.  Lungs are hyperinflated.  There is linear scarring at the right lung apex not changed from prior.  No focal consolidation.  No pneumothorax.  IMPRESSION: 1.  No interval change. 2.  Hyperinflated lungs with pulmonary scarring at the right lung apex.   Original Report Authenticated By: Genevive Bi, M.D.    Review of Systems  Constitutional: Positive for fever (fever recorded here, none at home) and weight loss (No sure 60.9 KG now  Weight last year 07/25/11:  64.3 KG.). Negative for chills, malaise/fatigue and diaphoresis.       Bed bound with MS, has a home health care worker, no family.  HENT: Negative.   Eyes: Negative for blurred vision, double vision, photophobia, pain, discharge and redness.       Just near sighted.  Respiratory: Negative.   Cardiovascular: Negative.   Gastrointestinal: Negative.   Genitourinary: Negative for dysuria, urgency, frequency, hematuria and flank pain.       Some odor.  Incontinent when asleep, he has good control when he is awake.   Musculoskeletal:       Bed bound, contracted with MS  Skin: Negative for itching and rash.       Right hip and sacral ulcers.  Neurological: Negative.  Negative for weakness.  Endo/Heme/Allergies: Negative.   Psychiatric/Behavioral: Negative.    Blood pressure 104/69, pulse 117, temperature 102.2 F (39 C), temperature source Oral, resp. rate 18, height 6\' 3"  (1.905 m), weight 60.9 kg (134 lb 4.2 oz), SpO2 97.00%. Physical Exam  Constitutional: He is oriented to person, place, and time. No distress.  Chronically ill male in bed contracted with knees to chest.  Thin and malnourished in  appearance. BP 104/69  Pulse 117  Temp(Src) 102.2 F (39 C) (Oral)  Resp 18  Ht 6\' 3"  (1.905 m)  Wt 60.9 kg (134 lb 4.2 oz)  BMI 16.78 kg/m2  SpO2 97%  HENT:  Head: Normocephalic and atraumatic.  Nose: Nose normal.  Mouth/Throat: No oropharyngeal exudate.  Eyes: Conjunctivae and EOM are normal. Pupils are equal, round, and reactive to light. Right eye exhibits no discharge. Left eye exhibits no discharge. No scleral icterus.  Neck: Normal range of motion. Neck supple. No JVD present. No tracheal deviation present. No thyromegaly present.  Cardiovascular: Regular rhythm, normal heart sounds and intact distal pulses.  Exam reveals no gallop.   No murmur heard. Tachycardic  Respiratory: Effort normal and breath sounds normal. No respiratory distress. He has no wheezes. He has no rales. He exhibits no tenderness.  GI: Soft. Bowel sounds are normal. He exhibits no distension and no mass. There is no tenderness. There is no rebound and no guarding.  Genitourinary: Penis normal.  Musculoskeletal: He exhibits no edema and no tenderness.  Lymphadenopathy:    He has no cervical adenopathy.  Neurological: He is alert and oriented to person, place, and time. No cranial nerve deficit.  Skin: He is not diaphoretic.  He has 2 large ulcers, one on the right hip.  It is 9 x 9 cm.  Then there is a second area adjacent to it that is 7 cm in diameter.  The majority of this is pink healed skin from prior ulcer.  The larger site has a 7 x 6 cm area that is stage IV ulcer.  With some necrotic tissue within it.  It is about .5 cm deep, and there is some undermining of it  At the 6 o'clock about 4-5 cm deep.  Some of the superficial necrotic tissue was debrided with scissors.  He has a second area on his back just above the sacrum, well above the rectum.  It was about 6 cm in diameter, with 1-2 cm undermining.  I sharply debrided some of the dry necrotic tissue with scissors at the bedside.  He did not feel any  of it.  Psychiatric: He has a normal mood and affect. His behavior is normal. Judgment and thought content normal.  Assessment/Plan: 1. Fever, Tachycardia, leukocytosis;  probable sepsis, of uncertain etiology. 2.Multiple sclerosis; bed bound with contractures of the knees to his chest. 3.Pt has 2 Stage IV ulcers one more on his back than his sacrum, and a second on his right hip.  These can use some more aggressive treatment, but I don't think it is the cause of his sepsis. 4. Hx of rhabdomyolysis after fall at home 07/2011.  Plan:  Wound care nurse will see.  I have taken a little of the necrotic tissue and will start BID dressing changes with NS wet to dry,  Ask hydrotherapy to see and work on these some while he is here.  We can follow with you.  We could open the areas more, but I'm not sure that will be a big help.  Ask nutrition to see and see if he's getting enough protein/calories to heal this.  Khai Torbert 10/13/2012, 7:36 AM

## 2012-10-13 NOTE — Consult Note (Signed)
WOC consult Note Reason for Consult:Will plan to evaluate in am with Zola Button, PA and hydrotherapy.  Thanks, Ladona Mow, MSN, RN, [facility], [facility]OCN [phone])

## 2012-10-13 NOTE — H&P (Signed)
Hospitalist Admission History and Physical  Patient name: [name] Mccoy Medical record number: [id number] Date of birth: Mar 20, 1961 Age: 52 y.o. Gender: male  Primary Care Provider: Londell Moh, MD  Chief Complaint: Dehydration, questionable sepsis History of Present Illness:This is a 52 y.o. year old male with multiple medical problems including multiple sclerosis, lower extremity contractures, sacral decubitus and hip pressure ulcers, presenting with dehydration, questionable sepsis. Patient currently lives at home with a in-house caregiver. Per report, physical therapy came by to see the patient while the caregiver was not there was some concern about the patient being dehydrated as he had elevated heart rate. Per the caregiver, his home health nurse was made aware of the situation and was he was recommended to call EMS because of the elevated heart rate. Patient states that he has felt overall asymptomatic over the past 24 hours. Patient currently denies any dysuria, nausea, abdominal pain, headache. However, the caregiver states the patient has had some issues and having some dark urine is also questionable bloody urine consistent with prior UTIs. 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 Wound dressings have been getting change and there have been no signs of recurrence or infection. Patient presented to the ER today with a temperature of 101.9 as well as white blood cell count of 17 and a heart rate into the 150s. Patient is noted to have been admitted for similar symptoms about 3-4 weeks ago. Patient was formally diagnosed with a urinary tract infection.  Patient Active Problem List   Diagnosis Date Noted  . Pressure ulcer, buttock(707.05)   . Pain in joint, pelvic region and thigh   . Spasm of muscle   . Unspecified vitamin D deficiency   . Sepsis 09/17/2012  . Hypotension 09/16/2012  . Tachycardia 09/16/2012   . SIRS (systemic inflammatory response syndrome) 09/16/2012  . UTI (urinary tract infection) 09/16/2012  . Hypokalemia 09/16/2012  . Anemia 09/16/2012  . Thrombocytosis 08/02/2011  . Fecal impaction 07/30/2011  . Normocytic anemia 07/30/2011  . Fall 07/30/2011  . Wounds, multiple 07/30/2011  . Rhabdomyolysis 07/26/2011  . Multiple sclerosis 07/26/2011  . Dehydration 07/26/2011   Past Medical History: Past Medical History  Diagnosis Date  . Multiple sclerosis   . Pressure ulcer, buttock(707.05)   . Pain in joint, pelvic region and thigh   . Spasm of muscle   . Unspecified vitamin D deficiency   . Disturbance of skin sensation     Past Surgical History: History reviewed. No pertinent past surgical history.  Social History: History   Social History  . Marital Status: Single    Spouse Name: N/A    Number of Children: N/A  . Years of Education: N/A   Social History Main Topics  . Smoking status: Never Smoker   . Smokeless tobacco: Never Used  . Alcohol Use: No  . Drug Use: No  . Sexually Active: None   Other Topics Concern  . None   Social History Narrative   Patient is single and lives at home alone. Patient is disabled.    Family History: Family History  Problem Relation Age of Onset  . Hypertension Mother   . Dementia Mother   . Hypertension Father   . Heart disease Father     Allergies: No Known Allergies  Current Facility-Administered Medications  Medication Dose Route Frequency Provider Last Rate Last Dose  . amantadine (SYMMETREL) capsule 100 mg  100 mg Oral  BID Doree Albee, MD      . collagenase Melburn Popper) ointment   Topical Daily Doree Albee, MD      . dantrolene (DANTRIUM) capsule 25 mg  25 mg Oral TID Doree Albee, MD      . ezetimibe (ZETIA) tablet 10 mg  10 mg Oral Daily Doree Albee, MD      . feeding supplement (ENSURE COMPLETE) liquid 237 mL  237 mL Oral TID BM Doree Albee, MD      . gabapentin (NEURONTIN) capsule 600 mg  600 mg  Oral QID Doree Albee, MD      . heparin injection 5,000 Units  5,000 Units Subcutaneous Q8H Doree Albee, MD      . HYDROcodone-acetaminophen (NORCO/VICODIN) 5-325 MG per tablet 1-2 tablet  1-2 tablet Oral Q8H PRN Doree Albee, MD      . Interferon Beta-1b (BETASERON/EXTAVIA) injection 0.3 mg  0.3 mg Subcutaneous QODAY Doree Albee, MD      . modafinil (PROVIGIL) tablet 200 mg  200 mg Oral BID Doree Albee, MD      . multivitamin with minerals tablet 1 tablet  1 tablet Oral Daily Doree Albee, MD      . nutrition supplement (JUVEN) powder packet 1 packet  1 packet Oral BID PC Doree Albee, MD      . tiZANidine (ZANAFLEX) tablet 4 mg  4 mg Oral TID PC & HS Doree Albee, MD      . vancomycin (VANCOCIN) IVPB 1000 mg/200 mL premix  1,000 mg Intravenous Once Gwyneth Sprout, MD 200 mL/hr at 10/13/12 0012 1,000 mg at 10/13/12 0012   Current Outpatient Prescriptions  Medication Sig Dispense Refill  . amantadine (SYMMETREL) 100 MG capsule Take 100 mg by mouth 2 (two) times daily.       . collagenase (SANTYL) ointment Apply topically daily. Apply to Unstageable pressure ulcer on coccyx once daily.  Apply 1/8 inch thick and top with saline dressing.  Change daily and PRN for soiling or loosening of dressing x 21 days.  90 g  1  . dantrolene (DANTRIUM) 25 MG capsule Take 25 mg by mouth 3 (three) times daily.      Marland Kitchen ezetimibe (ZETIA) 10 MG tablet Take 10 mg by mouth daily.      . feeding supplement (ENSURE COMPLETE) LIQD Take 237 mLs by mouth 3 (three) times daily between meals.      . gabapentin (NEURONTIN) 600 MG tablet Take 600 mg by mouth 4 (four) times daily.      Marland Kitchen HYDROcodone-acetaminophen (NORCO/VICODIN) 5-325 MG per tablet Take 1-2 tablets by mouth every 8 (eight) hours as needed for pain.  30 tablet  0  . interferon beta-1b (BETASERON) 0.3 MG injection Inject 0.3 mg into the skin every other day.       . modafinil (PROVIGIL) 200 MG tablet Take 200 mg by mouth 2 (two) times daily.       .  Multiple Vitamin (MULTIVITAMIN WITH MINERALS) TABS Take 1 tablet by mouth daily.      . nutrition supplement (JUVEN) PACK Take 1 packet by mouth 2 (two) times daily after a meal.      . tiZANidine (ZANAFLEX) 4 MG tablet Take 4 mg by mouth 4 (four) times daily - after meals and at bedtime.       Review Of Systems: 12 point ROS negative except as noted above in HPI.  Physical Exam: Filed Vitals:   10/12/12 2300  BP: 112/76  Pulse: 125  Temp:  Resp: 24    General: alert, cooperative and cachectic HEENT: PERRLA and extra ocular movement intact Heart: S1, S2 normal, no murmur, rub or gallop, regular rate and rhythm Lungs: clear to auscultation Abdomen: abdomen is soft without significant tenderness, masses, organomegaly or guarding Extremities: Lotion the contractures bilaterally. General muscle wasting. Stage treated for left-sided sacral decubitus ulcer with questionable bone palpation. Right hip sacral decubitus ulcer approximately stage 3-4. Skin: Noted wound changes as above Neurology: Cranial nerves II through XII grossly intact, baseline lotion the contractures hyperreflexic limited range of motion.  Labs and Imaging: Lab Results  Component Value Date/Time   NA 133* 10/12/2012  7:04 PM   K 4.7 10/12/2012  7:04 PM   CL 93* 10/12/2012  7:04 PM   CO2 24 10/12/2012  7:04 PM   BUN 8 10/12/2012  7:04 PM   CREATININE 0.51 10/12/2012  7:04 PM   GLUCOSE 96 10/12/2012  7:04 PM   Lab Results  Component Value Date   WBC 17.7* 10/12/2012   HGB 10.8* 10/12/2012   HCT 33.0* 10/12/2012   MCV 74.7* 10/12/2012   PLT 829* 10/12/2012    Dg Chest Port 1 View  10/12/2012   *RADIOLOGY REPORT*  Clinical Data: Rapid heart rate.  PORTABLE CHEST - 1 VIEW  Comparison: Fourth 32,014  Findings: Patient in  atypical positioning due to hip pain.  Stable cardiac silhouette.  Lungs are hyperinflated.  There is linear scarring at the right lung apex not changed from prior.  No focal consolidation.  No pneumothorax.   IMPRESSION: 1.  No interval change. 2.  Hyperinflated lungs with pulmonary scarring at the right lung apex.   Original Report Authenticated By: Genevive Bi, M.D.     Assessment and Plan: JRAKE RODRIQUEZ is a 52 y.o. year old male presenting with concern for sepsis.  Sepsis: Patient does meet overall sepsis criteria though patient is clinically stable. Will place patient on broad-spectrum antibiotics with vancomycin and Levaquin. Urinalysis is pending. Blood and urine cultures. Chest x-ray negative for any focal infiltrate. Wound cultures. Will place patient stepped down. Anticipate early transfer to the floor. We'll trend white count. Some concern for wound source of symptoms. Sacral/lungs overall stable currently. Surgery consultation to further evaluate his for the patient may need a formal debridement. Continue to follow.  Hyponatremia: Hypovolemic clinically. Dry on exam. Will hydrate patient and reassess. Continue to follow closely.  Leukocytosis: White blood cell count markedly elevated from recent hospital discharge. Place on broad spectrum antibiotics. Pending blood urine and sputum cultures. Trend white blood cell count while in house.  Pressure ulcers: Per the caregiver, pressures are somewhat near to baseline. Pain has been a chronic issue. Some questionable bony palpation on investigation.  Anemia: Baseline hemoglobin nonslipping. At baseline today. Suspect likely anemia of chronic disease. Continue to follow clinically.  Multiple sclerosis: Clinically at baseline. Continue home regimen.  Thrombocytosis: Acute or chronic issue. Elevated at last admission. Suspect is likely reactive component to this. Continue clinically follow with hydration.    FEN/GI: Normal saline. PPI.  Prophylaxis: Subcutaneous heparin Disposition: Pending further evaluation Code Status: Full code       Doree Albee MD  Pager: [phone]

## 2012-10-13 NOTE — Progress Notes (Signed)
ANTIBIOTIC CONSULT NOTE - INITIAL  Pharmacy Consult for Vancomycin Indication: rule out sepsis  No Known Allergies  Patient Measurements:   Wt=62 kg  Vital Signs: Temp: 101.9 F (38.8 C) (05/27 1934) Temp src: Rectal (05/27 1934) BP: 112/76 mmHg (05/27 2300) Pulse Rate: 125 (05/27 2300) Intake/Output from previous day:   Intake/Output from this shift:    Labs:  Recent Labs  10/12/12 1904  WBC 17.7*  HGB 10.8*  PLT 829*  CREATININE 0.51   The CrCl is unknown because both a height and weight (above a minimum accepted value) are required for this calculation. No results found for this basename: VANCOTROUGH, Leodis Binet, VANCORANDOM, GENTTROUGH, GENTPEAK, GENTRANDOM, TOBRATROUGH, TOBRAPEAK, TOBRARND, AMIKACINPEAK, AMIKACINTROU, AMIKACIN,  in the last 72 hours   Microbiology: Recent Results (from the past 720 hour(s))  URINE CULTURE     Status: None   Collection Time    09/16/12  3:10 AM      Result Value Range Status   Specimen Description URINE, CLEAN CATCH   Final   Special Requests NONE   Final   Culture  Setup Time 09/16/2012 10:17   Final   Colony Count >=100,000 COLONIES/ML   Final   Culture     Final   Value: Multiple bacterial morphotypes present, none predominant. Suggest appropriate recollection if clinically indicated.   Report Status 09/17/2012 FINAL   Final  MRSA PCR SCREENING     Status: None   Collection Time    09/16/12  3:59 AM      Result Value Range Status   MRSA by PCR NEGATIVE  NEGATIVE Final   Comment:            The GeneXpert MRSA Assay (FDA     approved for NASAL specimens     only), is one component of a     comprehensive MRSA colonization     surveillance program. It is not     intended to diagnose MRSA     infection nor to guide or     monitor treatment for     MRSA infections.  CULTURE, BLOOD (ROUTINE X 2)     Status: None   Collection Time    09/16/12  5:30 AM      Result Value Range Status   Specimen Description BLOOD RIGHT  ARM   Final   Special Requests BOTTLES DRAWN AEROBIC AND ANAEROBIC   Final   Culture  Setup Time 09/16/2012 10:06   Final   Culture NO GROWTH 5 DAYS   Final   Report Status 09/22/2012 FINAL   Final  CULTURE, BLOOD (ROUTINE X 2)     Status: None   Collection Time    09/16/12  5:40 AM      Result Value Range Status   Specimen Description BLOOD LEFT HAND   Final   Special Requests BOTTLES DRAWN AEROBIC ONLY   Final   Culture  Setup Time 09/16/2012 10:05   Final   Culture NO GROWTH 5 DAYS   Final   Report Status 09/22/2012 FINAL   Final  WOUND CULTURE     Status: None   Collection Time    09/16/12  2:57 PM      Result Value Range Status   Specimen Description LEG   Final   Special Requests Immunocompromised   Final   Gram Stain     Final   Value: RARE WBC PRESENT, PREDOMINANTLY PMN     NO SQUAMOUS EPITHELIAL CELLS SEEN  NO ORGANISMS SEEN   Culture     Final   Value: FEW STAPHYLOCOCCUS AUREUS     Note: RIFAMPIN AND GENTAMICIN SHOULD NOT BE USED AS SINGLE DRUGS FOR TREATMENT OF STAPH INFECTIONS.   Report Status 09/20/2012 FINAL   Final   Organism ID, Bacteria STAPHYLOCOCCUS AUREUS   Final  URINE CULTURE     Status: None   Collection Time    09/18/12  3:29 PM      Result Value Range Status   Specimen Description URINE, CLEAN CATCH   Final   Special Requests Immunocompromised   Final   Culture  Setup Time 09/18/2012 20:04   Final   Colony Count NO GROWTH   Final   Culture NO GROWTH   Final   Report Status 09/19/2012 FINAL   Final    Medical History: Past Medical History  Diagnosis Date  . Multiple sclerosis   . Pressure ulcer, buttock(707.05)   . Pain in joint, pelvic region and thigh   . Spasm of muscle   . Unspecified vitamin D deficiency   . Disturbance of skin sensation     Medications:  Scheduled:  . amantadine  100 mg Oral BID  . collagenase   Topical Daily  . dantrolene  25 mg Oral TID  . ezetimibe  10 mg Oral Daily  . feeding supplement  237 mL  Oral TID BM  . gabapentin  600 mg Oral QID  . heparin  5,000 Units Subcutaneous Q8H  . Interferon Beta-1b  0.3 mg Subcutaneous QODAY  . modafinil  200 mg Oral BID  . multivitamin with minerals  1 tablet Oral Daily  . nutrition supplement  1 packet Oral BID PC  . tiZANidine  4 mg Oral TID PC & HS   Infusions:  . sodium chloride 75 mL/hr at 10/13/12 0105  . levofloxacin (LEVAQUIN) IV     Assessment: 52 yo male with multiple sclerosis, LE contractures, sacral decubis/hip pressure ulcers presents to ER with dehydration, possible Sepsis.  Vancomycin per Rx ordered.  Goal of Therapy:  Vancomycin trough level 15-20 mcg/ml  Plan:   Vancomycin 1 Gm x1 in ER ~0015  Then 1250mg  IV q12h. CrCl~90 (N) (rounding to 1)  F/u SCr/levels/cultures as needed  Susanne Greenhouse R 10/13/2012,1:17 AM

## 2012-10-13 NOTE — Progress Notes (Signed)
[facility]  Patient Status: Active (receiving services up to time of hospitalization)  [facility] is providing the following services: RN, PT, OT and MSW- MSW was working on SNF placement for pt prior to admission into the hospital.    If patient discharges after hours, please call [phone].   Lanae Crumbly 10/13/2012, 10:36 AM

## 2012-10-13 NOTE — Consult Note (Signed)
General Surgery [facility]  Patient seen and examined this morning with Will Marlyne Beards.  We need recommendations from [facility] nursing.  If the undermined portions of these wounds is not accessible for wound care, then operative debridement to unroof these areas would be appropriate.  If the wounds can be dressed and kept clean as they are, then no operative intervention is needed.  Please direct.  Velora Heckler, MD, [facility] Office: [phone]

## 2012-10-13 NOTE — Progress Notes (Signed)
[facility] PROGRESS NOTE  [name] Mccoy ZOX:[id number] DOB: [birth year] DOA: 10/12/2012 PCP: Londell Moh, MD  Assessment/Plan: SEPSIS: Patient did meet overall sepsis criteria though patient is clinically stable.  broad-spectrum antibiotics with vancomycin and Levaquin. Urinalysis has mod LE. Blood and urine cultures pending. Chest x-ray negative for any focal infiltrate. Wound cultures.  trend white count. Some concern for wound source of symptoms. Sacral/lungs overall stable currently. Surgery consultation to further evaluate his for the patient may need a formal debridement along with wound care consult  Hyponatremia: Hypovolemic clinically. Dry on exam. Will hydrate patient and reassess. Continue to follow closely.   Hypokalemia- replete  Leukocytosis: White blood cell count markedly elevated from recent hospital discharge. Place on broad spectrum antibiotics. Pending blood/urinecultures.   Pressure ulcers: Per the caregiver, pressures are somewhat near to baseline.   Anemia: Baseline hemoglobin nonslipping. At baseline today. Suspect likely anemia of chronic disease. Continue to follow clinically.   Multiple sclerosis: Clinically at baseline. Continue home regimen.   Thrombocytosis: Acute or chronic issue. Elevated at last admission. Suspect is likely reactive component to this. Continue clinically follow with hydration.   Code Status: full Family Communication: patient at bedside Disposition Plan: SDU   Consultants:  Surgery  Wound care  Procedures:  debridement  Antibiotics:  vanc/  HPI/Subjective: Admitted last night  Objective: Filed Vitals:   10/13/12 0417 10/13/12 0500 10/13/12 0600 10/13/12 0700  BP:  116/64 91/56 104/69  Pulse:  137 129 117  Temp: 102.2 F (39 C)     TempSrc: Oral     Resp:  22 21 18   Height:      Weight:      SpO2:  99% 97% 97%    Intake/Output Summary (Last 24 hours) at 10/13/12 0820 Last data filed at  10/13/12 0700  Gross per 24 hour  Intake 543.75 ml  Output    445 ml  Net  98.75 ml   Filed Weights   10/13/12 0322  Weight: 60.9 kg (134 lb 4.2 oz)    Exam:   General:  NAD, laying in bed  Cardiovascular: rrr  Respiratory: clear anterior  Abdomen: +BS, soft, NT/ND  Musculoskeletal: weak LE  Data Reviewed: Basic Metabolic Panel:  Recent Labs Lab 10/12/12 1904 10/13/12 0538  NA 133* 132*  K 4.7 3.4*  CL 93* 98  CO2 24 22  GLUCOSE 96 108*  BUN 8 7  CREATININE 0.51 0.44*  CALCIUM 9.4 8.2*   Liver Function Tests:  Recent Labs Lab 10/12/12 1904 10/13/12 0538  AST 24 13  ALT 9 7  ALKPHOS 85 69  BILITOT 1.0 0.7  PROT 9.4* 7.2  ALBUMIN 2.6* 2.0*   No results found for this basename: LIPASE, AMYLASE,  in the last 168 hours No results found for this basename: AMMONIA,  in the last 168 hours CBC:  Recent Labs Lab 10/12/12 1904 10/13/12 0538  WBC 17.7* 17.9*  NEUTROABS 14.4* 14.6*  HGB 10.8* 7.9*  HCT 33.0* 24.8*  MCV 74.7* 74.3*  PLT 829* 624*   Cardiac Enzymes: No results found for this basename: CKTOTAL, CKMB, CKMBINDEX, TROPONINI,  in the last 168 hours BNP (last 3 results) No results found for this basename: PROBNP,  in the last 8760 hours CBG: No results found for this basename: GLUCAP,  in the last 168 hours  Recent Results (from the past 240 hour(s))  MRSA PCR SCREENING     Status: None   Collection Time    10/13/12  3:12  AM      Result Value Range Status   MRSA by PCR NEGATIVE  NEGATIVE Final   Comment:            The GeneXpert MRSA Assay (FDA     approved for NASAL specimens     only), is one component of a     comprehensive MRSA colonization     surveillance program. It is not     intended to diagnose MRSA     infection nor to guide or     monitor treatment for     MRSA infections.     Studies: Dg Chest Port 1 View  10/12/2012   *RADIOLOGY REPORT*  Clinical Data: Rapid heart rate.  PORTABLE CHEST - 1 VIEW  Comparison:  Fourth 32,014  Findings: Patient in  atypical positioning due to hip pain.  Stable cardiac silhouette.  Lungs are hyperinflated.  There is linear scarring at the right lung apex not changed from prior.  No focal consolidation.  No pneumothorax.  IMPRESSION: 1.  No interval change. 2.  Hyperinflated lungs with pulmonary scarring at the right lung apex.   Original Report Authenticated By: Genevive Bi, M.D.    Scheduled Meds: . amantadine  100 mg Oral BID  . collagenase   Topical Daily  . dantrolene  25 mg Oral TID  . ezetimibe  10 mg Oral Daily  . feeding supplement  237 mL Oral TID BM  . gabapentin  600 mg Oral QID  . heparin  5,000 Units Subcutaneous Q8H  . Interferon Beta-1b  0.3 mg Subcutaneous QODAY  . levofloxacin (LEVAQUIN) IV  500 mg Intravenous Q24H  . modafinil  200 mg Oral BID  . multivitamin with minerals  1 tablet Oral Daily  . nutrition supplement  1 packet Oral BID PC  . potassium chloride  40 mEq Oral Once  . tiZANidine  4 mg Oral TID PC & HS  . vancomycin  1,250 mg Intravenous Q12H   Continuous Infusions: . sodium chloride 75 mL/hr at 10/13/12 0105    Active Problems:   Multiple sclerosis   Wounds, multiple   Hypokalemia   Anemia   Pressure ulcer, buttock(707.05)   Leukocytosis, unspecified    Time spent: 35 min    Abbigail Anstey  [facility] Pager [phone]. If 7PM-7AM, please contact night-coverage at [email or web address], password [facility] 10/13/2012, 8:20 AM  LOS: 1 day

## 2012-10-14 LAB — CBC
HCT: 24.5 % — ABNORMAL LOW (ref 39.0–52.0)
MCH: 24 pg — ABNORMAL LOW (ref 26.0–34.0)
MCHC: 31.8 g/dL (ref 30.0–36.0)
MCV: 75.4 fL — ABNORMAL LOW (ref 78.0–100.0)
Platelets: 723 10*3/uL — ABNORMAL HIGH (ref 150–400)
RDW: 15.5 % (ref 11.5–15.5)

## 2012-10-14 LAB — BASIC METABOLIC PANEL
BUN: 7 mg/dL (ref 6–23)
Calcium: 8.2 mg/dL — ABNORMAL LOW (ref 8.4–10.5)
Chloride: 103 mEq/L (ref 96–112)
Creatinine, Ser: 0.47 mg/dL — ABNORMAL LOW (ref 0.50–1.35)
GFR calc Af Amer: >90 mL/min (ref >90)

## 2012-10-14 LAB — VANCOMYCIN, TROUGH: Vancomycin Tr: 19 ug/mL (ref 10.0–20.0)

## 2012-10-14 MED ORDER — LEVOFLOXACIN IN D5W 750 MG/150ML IV SOLN
750.0000 mg | INTRAVENOUS | Status: DC
  Administered 2012-10-15 – 2012-10-22 (×8): 750 mg via INTRAVENOUS
  Filled 2012-10-14 (×10): qty 150

## 2012-10-14 MED ORDER — HYDROMORPHONE HCL PF 1 MG/ML IJ SOLN
0.5000 mg | INTRAMUSCULAR | Status: DC | PRN
  Administered 2012-10-14: 0.5 mg via INTRAVENOUS
  Filled 2012-10-14: qty 1

## 2012-10-14 NOTE — Consult Note (Addendum)
WOC consult Note Reason for Consult:Evaluate right hip and sacral pressure ulcers, recommendation for topical therapy, contribute to POC Wound type:Pressure, chronic non-healing Pressure Ulcer POA: Yes Measurement:Sacrall:  6cm x 5.5cm x .4cm with undermining from 6-10 o'clock measuring 4cm. Small necrotic area (soft yellow/black eschar) at 6 o'clock measuring .5cm x 3cm.   Right hip:  7cm x 5cm x .4cm with undermining from 3-8 o'clock measuring 3.5cm.  Wound margin from 8-3 o'clock with epibole (rolled, closed wound edges).  Wound bed:  Red, moist, non-granulating.  Sucpect biofilm in that open wounds have been in existence for greater than 6 months with limited changes in dimension. Drainage (amount, consistency, odor)  Periwound:intact with evidence of past healing of ulcerations of unknown depth (but suspect full thickness) Dressing procedure/placement/frequency:Patient examined with two physician assistants with [facility] as well as two  physical therapists ready to perform hydrotherapy.  I would agree with going ahead with pulsatile lavage today and daily until op procedure (if planned) as it can reduce biofilm. Today's plan is to place saline dressings and to consult with Dr. Nat Christen for consideration of possible operative procedure to "open" these wounds and expose the undermined tissue, aiding and assisting the home care providers to execute efficient wound care.  This also eliminates the possibility of over-packing the undermined areas and consequently contributing to an increase in wound dimensions. Additionally, the surgical procedure "jump-starts" the healing cascade and activates the natural release of growth factors, etc. needed for wound healing.  NB:  Of concern is this patient's contractures which are worsening and will further contribute to pressure injuries in the future.  Two turning surfaces are involved at this point with Stage IV ulcerations; to effective position off of these areas, the  sacrum is the only surface available and it too, will inevitably succumb to breakdown. While infrequently used, we may need to consider air-fluidized bed therapy (vs. the low air loss upon which he currently sleeps) both in acute care and post-acute.  Recommendation for consideration in an LTAC for specialized wound care post procedure, if patient is deemed appropriate. I will follow along with you and remain available to this patient and his nursing and surgical teams, but not follow closely.  Please re-consult if needed in-between visits. Thanks, Ladona Mow, MSN, RN, [facility], [facility] [phone])

## 2012-10-14 NOTE — Progress Notes (Signed)
[facility] PROGRESS NOTE  [name] Mccoy GNF:[id number] DOB: 07-09-1960 DOA: 10/12/2012 PCP: Londell Moh, MD  Assessment/Plan: SEPSIS: Patient did meet overall sepsis criteria though patient is clinically stable.  broad-spectrum antibiotics with vancomycin and Levaquin. Urinalysis has mod LE. Blood and urine cultures pending. Chest x-ray negative for any focal infiltrate. Wound cultures.  trend white count. Some concern for wound source of symptoms. Sacral/lungs overall stable currently. Surgery consultation along with wound care consult  Hyponatremia: Hypovolemic clinically. Dry on exam. Will hydrate patient and reassess. Continue to follow closely.   Hypokalemia- replete  Leukocytosis: White blood cell count markedly elevated from recent hospital discharge. Place on broad spectrum antibiotics. Pending blood/urinecultures- no reported diarrhea   Pressure ulcers: Per the caregiver, pressures are somewhat near to baseline, wound care   Anemia: Baseline hemoglobin nonslipping. At baseline today. Suspect likely anemia of chronic disease. Continue to follow clinically.   Multiple sclerosis: Clinically at baseline. Continue home regimen.   Thrombocytosis: Acute or chronic issue. Elevated at last admission. Suspect is likely reactive component to this. Continue clinically follow with hydration.   Code Status: full Family Communication: patient at bedside Disposition Plan: tx out SDU to floor   Consultants:  Surgery  Wound care  Procedures:  debridement  Antibiotics:  vanc/levaquin  HPI/Subjective: Admitted last night  Objective: Filed Vitals:   10/13/12 2311 10/14/12 0000 10/14/12 0400 10/14/12 0610  BP: 94/62   95/62  Pulse: 125   108  Temp:  99.2 F (37.3 C) 99.1 F (37.3 C)   TempSrc:  Oral Oral   Resp: 22   17  Height:      Weight:      SpO2: 98%   98%    Intake/Output Summary (Last 24 hours) at 10/14/12 0759 Last data filed at 10/14/12  0600  Gross per 24 hour  Intake   2325 ml  Output   1800 ml  Net    525 ml   Filed Weights   10/13/12 0322  Weight: 60.9 kg (134 lb 4.2 oz)    Exam:   General:  NAD, laying in bed eating breakfast  Cardiovascular: rrr  Respiratory: clear anterior  Abdomen: +BS, soft, NT/ND  Musculoskeletal: weak LE  Data Reviewed: Basic Metabolic Panel:  Recent Labs Lab 10/12/12 1904 10/13/12 0538  NA 133* 132*  K 4.7 3.4*  CL 93* 98  CO2 24 22  GLUCOSE 96 108*  BUN 8 7  CREATININE 0.51 0.44*  CALCIUM 9.4 8.2*   Liver Function Tests:  Recent Labs Lab 10/12/12 1904 10/13/12 0538  AST 24 13  ALT 9 7  ALKPHOS 85 69  BILITOT 1.0 0.7  PROT 9.4* 7.2  ALBUMIN 2.6* 2.0*   No results found for this basename: LIPASE, AMYLASE,  in the last 168 hours No results found for this basename: AMMONIA,  in the last 168 hours CBC:  Recent Labs Lab 10/12/12 1904 10/13/12 0538  WBC 17.7* 17.9*  NEUTROABS 14.4* 14.6*  HGB 10.8* 7.9*  HCT 33.0* 24.8*  MCV 74.7* 74.3*  PLT 829* 624*   Cardiac Enzymes: No results found for this basename: CKTOTAL, CKMB, CKMBINDEX, TROPONINI,  in the last 168 hours BNP (last 3 results) No results found for this basename: PROBNP,  in the last 8760 hours CBG: No results found for this basename: GLUCAP,  in the last 168 hours  Recent Results (from the past 240 hour(s))  MRSA PCR SCREENING     Status: None   Collection Time  10/13/12  3:12 AM      Result Value Range Status   MRSA by PCR NEGATIVE  NEGATIVE Final   Comment:            The GeneXpert MRSA Assay (FDA     approved for NASAL specimens     only), is one component of a     comprehensive MRSA colonization     surveillance program. It is not     intended to diagnose MRSA     infection nor to guide or     monitor treatment for     MRSA infections.  WOUND CULTURE     Status: None   Collection Time    10/13/12  5:00 AM      Result Value Range Status   Specimen Description WOUND RT  HIP   Final   Special Requests Normal   Final   Gram Stain     Final   Value: ABUNDANT WBC PRESENT, PREDOMINANTLY PMN     NO SQUAMOUS EPITHELIAL CELLS SEEN     MODERATE GRAM POSITIVE COCCI     IN PAIRS   Culture PENDING   Incomplete   Report Status PENDING   Incomplete  WOUND CULTURE     Status: None   Collection Time    10/13/12  5:00 AM      Result Value Range Status   Specimen Description WOUND SACRAL   Final   Special Requests NONE   Final   Gram Stain     Final   Value: ABUNDANT WBC PRESENT, PREDOMINANTLY PMN     NO SQUAMOUS EPITHELIAL CELLS SEEN     ABUNDANT GRAM POSITIVE COCCI     IN PAIRS MODERATE GRAM NEGATIVE RODS   Culture PENDING   Incomplete   Report Status PENDING   Incomplete     Studies: Dg Chest Port 1 View  10/12/2012   *RADIOLOGY REPORT*  Clinical Data: Rapid heart rate.  PORTABLE CHEST - 1 VIEW  Comparison: Fourth 32,014  Findings: Patient in  atypical positioning due to hip pain.  Stable cardiac silhouette.  Lungs are hyperinflated.  There is linear scarring at the right lung apex not changed from prior.  No focal consolidation.  No pneumothorax.  IMPRESSION: 1.  No interval change. 2.  Hyperinflated lungs with pulmonary scarring at the right lung apex.   Original Report Authenticated By: Genevive Bi, M.D.    Scheduled Meds: . amantadine  100 mg Oral BID  . collagenase   Topical Daily  . dantrolene  25 mg Oral TID  . ezetimibe  10 mg Oral Daily  . feeding supplement  237 mL Oral TID BM  . gabapentin  600 mg Oral QID  . heparin  5,000 Units Subcutaneous Q8H  . Interferon Beta-1b  0.3 mg Subcutaneous QODAY  . levofloxacin (LEVAQUIN) IV  500 mg Intravenous Q24H  . modafinil  200 mg Oral BID  . multivitamin with minerals  1 tablet Oral Daily  . nutrition supplement  1 packet Oral BID PC  . tiZANidine  4 mg Oral TID PC & HS  . vancomycin  1,250 mg Intravenous Q12H   Continuous Infusions: . sodium chloride 75 mL/hr at 10/13/12 1749    Active  Problems:   Multiple sclerosis   Wounds, multiple   Hypokalemia   Anemia   Pressure ulcer, buttock(707.05)   Leukocytosis, unspecified    Time spent: 35 min    Fontaine Hehl  [facility] Pager [phone]. If 7PM-7AM,  please contact night-coverage at [email or web address], password [facility] 10/14/2012, 7:59 AM  LOS: 2 days

## 2012-10-14 NOTE — Progress Notes (Signed)
General Surgery [facility]  Patient seen and examined.  Reviewed notes from [facility] nurse and recommendations.  Plan operative debridement in OR tomorrow of right hip decubitus and sacral decubitus.  Risks and benefits reviewed with patient.  He understands and agrees to proceed.  Will schedule.  Velora Heckler, MD, [facility] Office: [phone]

## 2012-10-14 NOTE — Progress Notes (Signed)
ANTIBIOTIC CONSULT NOTE - FOLLOW UP  Pharmacy Consult for Vancomycin Indication: R/O Sepsis (decubital/pressure ulcers)  No Known Allergies  Patient Measurements: Height: 6\' 3"  (190.5 cm) Weight: 134 lb 4.2 oz (60.9 kg) IBW/kg (Calculated) : 84.5   Vital Signs: Temp: 98.4 F (36.9 C) (05/29 0800) Temp src: Oral (05/29 0800) BP: 89/65 mmHg (05/29 1115) Pulse Rate: 114 (05/29 1115) Intake/Output from previous day: 05/28 0701 - 05/29 0700 In: 2400 [I.V.:1800; IV Piggyback:600] Out: 1800 [Urine:1800] Intake/Output from this shift: Total I/O In: 375 [I.V.:375] Out: 470 [Urine:470]  Labs:  Recent Labs  10/12/12 1904 10/13/12 0538 10/14/12 0755  WBC 17.7* 17.9* 10.6*  HGB 10.8* 7.9* 7.8*  PLT 829* 624* 723*  CREATININE 0.51 0.44* 0.47*   Estimated Creatinine Clearance: 95.2 ml/min (by C-G formula based on Cr of 0.47). No results found for this basename: Rolm Gala, VANCORANDOM, GENTTROUGH, GENTPEAK, GENTRANDOM, TOBRATROUGH, TOBRAPEAK, TOBRARND, AMIKACINPEAK, AMIKACINTROU, AMIKACIN,  in the last 72 hours    Assessment: 68 yom with h/o MS, chronic decubitus ulcer discharged from [facility] 09/21/12 (staph aureus decubitus ulcer, possible UTI) brought to ED on 5/27 by EMS given home health nurse concern for dehydration/weakness. In ED, found Tmax 101.9, tachycardic, w/ leukocytosis and 2 stage IV decubitus ulcers. CXR negative for infiltrate. Concerning for UTI vs wound infection. Vanc per Rx, Levaquin per MD started. Surgery on board  Today is D#2 Vancomycin, Levaquin. Patient is afebrile, WBC improving to 10.6K, Scr low for CG CrCl 95 ml/min, normalized CrCl > 100 ml/min.   Blood cultures pending, sputum cultured ordered but not collected, wound culture x 2 re-incubated for better growth but gram stain growing GPC in pairs and moderate GNR.    Goal of Therapy:  Vancomycin trough level 15-20 mcg/ml  Plan:   Continue Vanc 1250 mg IV q12h.  Will check a vancomycin  trough prior to dose at 2200 tonight  MD: Consider increasing Levaquin to 750 mg IV q24h for now   Pharmacy will f/u  Geoffry Paradise, PharmD, BCPS Pager: [phone] 12:35 PM Pharmacy #: [phone]

## 2012-10-14 NOTE — Progress Notes (Signed)
OT Cancellation/Screen Note  Patient Details Name: ADDIE CEDERBERG MRN: [id number] DOB: 09/29/1960   Cancelled Treatment:     Noted pt was bedbound at home and was dependent with bathing/dressing/mobility.  OT saw pt on 5/1 and he did not tolerate movement in bed well.  Will sign off.    Rondall Radigan 10/14/2012, 1:55 PM Marica Otter, OTR/L [phone] 10/14/2012

## 2012-10-14 NOTE — Progress Notes (Signed)
Physical Therapy Hydrotherapy Evaluation   10/14/12 1346  Subjective Assessment  Subjective I thought I was the only left handed person around.  Patient and Family Stated Goals agreeable to plan for wound care  Date of Onset (prior to admission)  Prior Treatments dressing changes  Evaluation and Treatment  Evaluation and Treatment Procedures Explained to Patient/Family Yes  Evaluation and Treatment Procedures agreed to  Pressure Ulcer  Date First Assessed/Time First Assessed: 10/13/12 0330   Location: Hip  Location Orientation: Right  Staging: Stage III -  Full thickness tissue loss. Subcutaneous fat may be visible but bone, tendon or muscle are NOT exposed.  Wound Description (Comment  Site / Wound Assessment Clean;Dry  Peri-wound Assessment Intact  Drainage Amount Moderate  Drainage Description Serosanguineous  Dressing Type Moist to dry  Dressing Clean;Dry;Intact  Pressure Ulcer  Date First Assessed/Time First Assessed: 10/13/12 0330   Location: Sacrum  Staging: Stage III -  Full thickness tissue loss. Subcutaneous fat may be visible but bone, tendon or muscle are NOT exposed.  Present on Admission: Yes  Site / Wound Assessment Pink;Red  % Wound base Red or Granulating 90%  % Wound base Yellow 10%  Peri-wound Assessment Intact  Drainage Amount Moderate  Drainage Description Serous  Dressing Type Moist to dry  Dressing Clean;Dry;Intact  Pressure Ulcer 10/14/12 Stage IV - Full thickness tissue loss with exposed bone, tendon or muscle. sacral pressure ulcer  Date First Assessed/Time First Assessed: 10/14/12 1135   Location: Sacrum  Location Orientation: Mid  Staging: Stage IV - Full thickness tissue loss with exposed bone, tendon or muscle.  Wound Description (Comments): sacral pressure ulcer  Present on Adm  State of Healing Non-healing  Site / Wound Assessment Granulation tissue;Red  % Wound base Red or Granulating 80%  % Wound base Yellow 20%  Peri-wound Assessment Erythema  (blanchable);Intact  Wound Length (cm) 6 cm  Wound Width (cm) 5.5 cm  Wound Depth (cm) 0.4 cm  Undermining (cm) 4 (7 to 2 o'clock)  Margins Unattacted edges (unapproximated)  Drainage Amount Moderate  Drainage Description Serosanguineous  Treatment Debridement (Selective);Hydrotherapy (Pulse lavage);Packing (Saline gauze)  Dressing Type ABD;Moist to dry  Dressing Changed  Hydrotherapy  Pulsed Lavage with Suction (psi) 8 psi ((4-12))  Pulsed Lavage with Suction - Normal Saline Used 1000 mL  Pulsed Lavage Tip Tip with splash shield  Pulsed lavage therapy - wound location sacrum  Selective Debridement  Selective Debridement - Location sacral pressure ulcer  Selective Debridement - Tools Used Scissors;Forceps  Selective Debridement - Tissue Removed yellow slough, black eschar around 6 o'clock  Wound Therapy - Assess/Plan/Recommendations  Wound Therapy - Clinical Statement Pt would benefit from hydrotherapy to decrease bioburden, remove nonviable tissue, and facilitate wound healing.  Wound Therapy - Functional Problem List bedbound pt with LE flexion contractures, hx of multiple sclerosis  Factors Delaying/Impairing Wound Healing Immobility;Multiple medical problems  Hydrotherapy Plan Debridement;Dressing change;Pulsatile lavage with suction;Patient/family education  Wound Therapy - Frequency 5X / week  Wound Therapy - Follow Up Recommendations Home health RN  Wound Plan Perform selective debridement, dressing changes, and hydrotherapy to sacral pressure ulcer to promote wound healing.  Wound Therapy Goals - Improve the function of patient's integumentary system by progressing the wound(s) through the phases of wound healing by:  Decrease Necrotic Tissue to 10  Decrease Necrotic Tissue - Progress Goal set today  Increase Granulation Tissue to 90  Increase Granulation Tissue - Progress Goal set today  Decrease Length/Width/Depth by (cm) .5/.5/.5  Decrease  Length/Width/Depth - Progress  Goal set today  Improve Drainage Characteristics Min  Improve Drainage Characteristics - Progress Goal set today  Goals/treatment plan/discharge plan were made with and agreed upon by patient/family Yes  Time For Goal Achievement 2 weeks  Wound Therapy - Potential for Goals Fair   Time: 1610-9604 Pt premedicate by IV.  Zenovia Jarred, PT, DPT 10/14/2012 Pager: [phone]

## 2012-10-14 NOTE — Clinical Social Work Note (Signed)
CSW attempted to see Pt, but he was getting wound care. CSW saw Pt several weeks ago and he wanted SNF, but had issues with his insurance. CSW will reattempt to see in the am. He should be SNF appropriate for wound care and now appears to have [employer] CSW to follow.   Doreen Salvage, LCSW ICU/Stepdown Clinical Social Worker [facility] Cell [phone] Hours 8am-1200pm M-F

## 2012-10-14 NOTE — Progress Notes (Signed)
Calorie count envelope has been hung on the patient's door. Document percent consumed for each item on the patient's meal tray ticket and keep in envelope. Also document percent of any supplement or snack pt consumes and keep documentation in envelope for RD to review. Pt made aware that calorie count is in progress. Ian Malkin RD, LDN Inpatient Clinical Dietitian Pager: [phone] After Hours Pager: [phone]

## 2012-10-14 NOTE — Progress Notes (Signed)
Subjective: Stable and no complaints this AM, until we roll him to look at the wounds.  Objective: Vital signs in last 24 hours: Temp:  [98.4 F (36.9 C)-99.2 F (37.3 C)] 98.4 F (36.9 C) (05/29 0800) Pulse Rate:  [108-128] 114 (05/29 1115) Resp:  [14-24] 24 (05/29 1115) BP: (89-98)/(56-65) 89/65 mmHg (05/29 1115) SpO2:  [97 %-100 %] 97 % (05/29 1115) Last BM Date: 10/13/12 I/O not really sure Diet: regular TM99.2,  BP in the  90's, ongoing tachycardia No labs this AM Intake/Output from previous day: 05/28 0701 - 05/29 0700 In: 2400 [I.V.:1800; IV Piggyback:600] Out: 1800 [Urine:1800] Intake/Output this shift: Total I/O In: 375 [I.V.:375] Out: 470 [Urine:470]  General appearance: alert, cooperative and no distress Resp: clear to auscultation bilaterally Skin: wounds look the same as yesterday, I have a better look at the one on the back and there is more undermining than I could see yesterday.  It is clean but will be difficult to cover.  Lab Results:   Recent Labs  10/13/12 0538 10/14/12 0755  WBC 17.9* 10.6*  HGB 7.9* 7.8*  HCT 24.8* 24.5*  PLT 624* 723*    BMET  Recent Labs  10/13/12 0538 10/14/12 0755  NA 132* 136  K 3.4* 3.4*  CL 98 103  CO2 22 25  GLUCOSE 108* 102*  BUN 7 7  CREATININE 0.44* 0.47*  CALCIUM 8.2* 8.2*   PT/INR No results found for this basename: LABPROT, INR,  in the last 72 hours   Recent Labs Lab 10/12/12 1904 10/13/12 0538  AST 24 13  ALT 9 7  ALKPHOS 85 69  BILITOT 1.0 0.7  PROT 9.4* 7.2  ALBUMIN 2.6* 2.0*     Lipase  No results found for this basename: lipase     Studies/Results: Dg Chest Port 1 View  10/12/2012   *RADIOLOGY REPORT*  Clinical Data: Rapid heart rate.  PORTABLE CHEST - 1 VIEW  Comparison: Fourth 32,014  Findings: Patient in  atypical positioning due to hip pain.  Stable cardiac silhouette.  Lungs are hyperinflated.  There is linear scarring at the right lung apex not changed from prior.  No  focal consolidation.  No pneumothorax.  IMPRESSION: 1.  No interval change. 2.  Hyperinflated lungs with pulmonary scarring at the right lung apex.   Original Report Authenticated By: Genevive Bi, M.D.    Medications: . amantadine  100 mg Oral BID  . collagenase   Topical Daily  . dantrolene  25 mg Oral TID  . ezetimibe  10 mg Oral Daily  . feeding supplement  237 mL Oral TID BM  . gabapentin  600 mg Oral QID  . heparin  5,000 Units Subcutaneous Q8H  . Interferon Beta-1b  0.3 mg Subcutaneous QODAY  . levofloxacin (LEVAQUIN) IV  500 mg Intravenous Q24H  . modafinil  200 mg Oral BID  . multivitamin with minerals  1 tablet Oral Daily  . nutrition supplement  1 packet Oral BID PC  . tiZANidine  4 mg Oral TID PC & HS  . vancomycin  1,250 mg Intravenous Q12H    Assessment/Plan 1. Fever, Tachycardia, leukocytosis; probable sepsis, of uncertain etiology.  WBC is improving. 2.Multiple sclerosis; bed bound with contractures of the knees to his chest.  3.Pt has 2 Stage IV ulcers one more on his back than his sacrum, and a second on his right hip. These can use some more aggressive treatment, but I don't think it is the cause of his sepsis.  4. Hx of rhabdomyolysis after fall at home 07/2011.   Plan:  We looked at this with Blane Ohara our Wound Care Nurse, and she recommends we consider surgical debridement in order to get better dressing care and healing.  We have explained this to Mr. Bowlby and he is more than willing to consider this option.  There is also concern that with his contractures and limited pressure points he is bound to have more future skin breakdown. I will talk with Dr. Gerrit Friends about this.  LOS: 2 days    Secily Walthour 10/14/2012

## 2012-10-14 NOTE — Progress Notes (Signed)
PT Cancellation Note / Screen  Patient Details Name: [name] Mccoy MRN: [id number] DOB: Dec 18, 1960   Cancelled Treatment:    Reason Eval/Treat Not Completed: Other (comment) Pt unable to tolerate rolling (+2 assist and increased pain/discomfort) during hydrotherapy session, hx of MS, LE contractures, bedbound, so not appropriate for physical therapy (mobility).  Will continue to see for hydrotherapy unless discontinued.  Thanks.    Conlan Miceli,KATHrine E 10/14/2012, 2:31 PM Zenovia Jarred, PT, DPT 10/14/2012 Pager: [phone]

## 2012-10-14 NOTE — Progress Notes (Signed)
Brief Pharmacy Update: CONCERNING:  Vancomycin  DESCRIPTION:  Please see consult note written earlier today for full details on antibiotic dosing.  Briefly, pt on vancomycin, levaquin for r/o sepsis (decubital/pressure ulcers).  Vancomycin level checked to ensure adequate concentration.    5/29: Vancomycin trough  = 19, therapeutic (goal 15-20).   Last dose given 2 hours late, so this level is not quite true trough, but extrapolated level should still be in range.     Plan: Continue Vancomycin 1250mg  IV q 12 h.    Haynes Hoehn, PharmD 10/14/2012 9:50 PM  Pager: [phone]

## 2012-10-15 ENCOUNTER — Inpatient Hospital Stay (HOSPITAL_COMMUNITY): Payer: BC Managed Care – PPO | Admitting: Anesthesiology

## 2012-10-15 ENCOUNTER — Encounter (HOSPITAL_COMMUNITY): Payer: Self-pay | Admitting: Anesthesiology

## 2012-10-15 ENCOUNTER — Encounter (HOSPITAL_COMMUNITY)
Admission: EM | Disposition: A | Discharge status: Skilled Nursing Facility | Payer: Self-pay | Source: Home / Self Care | Attending: Internal Medicine

## 2012-10-15 HISTORY — PX: WOUND DEBRIDEMENT: SHX247

## 2012-10-15 LAB — WOUND CULTURE

## 2012-10-15 LAB — CBC
MCH: 23.2 pg — ABNORMAL LOW (ref 26.0–34.0)
MCHC: 30.4 g/dL (ref 30.0–36.0)
MCV: 76.2 fL — ABNORMAL LOW (ref 78.0–100.0)
Platelets: 678 10*3/uL — ABNORMAL HIGH (ref 150–400)
RDW: 15.8 % — ABNORMAL HIGH (ref 11.5–15.5)

## 2012-10-15 SURGERY — DEBRIDEMENT, WOUND
Anesthesia: General | Site: Coccyx | Laterality: Right | Wound class: Dirty or Infected

## 2012-10-15 MED ORDER — ONDANSETRON HCL 4 MG/2ML IJ SOLN: 4.0000 mg | Freq: Four times a day (QID) | INTRAMUSCULAR | Status: DC | PRN

## 2012-10-15 MED ORDER — VITAMINS A & D EX OINT
TOPICAL_OINTMENT | CUTANEOUS | Status: AC
  Administered 2012-10-15: 5
  Filled 2012-10-15: qty 10

## 2012-10-15 MED ORDER — FENTANYL CITRATE 0.05 MG/ML IJ SOLN
INTRAMUSCULAR | Status: DC | PRN
  Administered 2012-10-15 (×2): 50 ug via INTRAVENOUS

## 2012-10-15 MED ORDER — MEPERIDINE HCL 50 MG/ML IJ SOLN: 6.2500 mg | INTRAMUSCULAR | Status: DC | PRN

## 2012-10-15 MED ORDER — ONDANSETRON HCL 4 MG/2ML IJ SOLN
INTRAMUSCULAR | Status: DC | PRN
  Administered 2012-10-15: 4 mg via INTRAVENOUS

## 2012-10-15 MED ORDER — FENTANYL CITRATE 0.05 MG/ML IJ SOLN
25.0000 ug | INTRAMUSCULAR | Status: DC | PRN
  Administered 2012-10-15 (×3): 25 ug via INTRAVENOUS

## 2012-10-15 MED ORDER — KCL IN DEXTROSE-NACL 20-5-0.45 MEQ/L-%-% IV SOLN
INTRAVENOUS | Status: DC
  Administered 2012-10-15: 1000 mL via INTRAVENOUS
  Administered 2012-10-16 – 2012-10-17 (×3): via INTRAVENOUS
  Filled 2012-10-15 (×7): qty 1000

## 2012-10-15 MED ORDER — PROMETHAZINE HCL 25 MG/ML IJ SOLN: 6.2500 mg | INTRAMUSCULAR | Status: DC | PRN

## 2012-10-15 MED ORDER — LACTATED RINGERS IV SOLN
INTRAVENOUS | Status: DC
  Administered 2012-10-15: 1000 mL via INTRAVENOUS
  Administered 2012-10-15: 10:00:00 via INTRAVENOUS

## 2012-10-15 MED ORDER — ONDANSETRON HCL 4 MG PO TABS: 4.0000 mg | ORAL_TABLET | Freq: Four times a day (QID) | ORAL | Status: DC | PRN

## 2012-10-15 MED ORDER — LACTATED RINGERS IV SOLN: INTRAVENOUS | Status: DC

## 2012-10-15 MED ORDER — FENTANYL CITRATE 0.05 MG/ML IJ SOLN
50.0000 ug | INTRAMUSCULAR | Status: AC | PRN
  Administered 2012-10-15: 100 ug via INTRAVENOUS

## 2012-10-15 MED ORDER — PROPOFOL 10 MG/ML IV BOLUS
INTRAVENOUS | Status: DC | PRN
  Administered 2012-10-15: 180 mg via INTRAVENOUS

## 2012-10-15 MED ORDER — MIDAZOLAM HCL 5 MG/5ML IJ SOLN
INTRAMUSCULAR | Status: DC | PRN
  Administered 2012-10-15: 2 mg via INTRAVENOUS

## 2012-10-15 MED ORDER — HEPARIN SODIUM (PORCINE) 5000 UNIT/ML IJ SOLN
5000.0000 [IU] | Freq: Three times a day (TID) | INTRAMUSCULAR | Status: DC
  Administered 2012-10-16 – 2012-10-22 (×20): 5000 [IU] via SUBCUTANEOUS
  Filled 2012-10-15 (×22): qty 1

## 2012-10-15 MED ORDER — HYDROCODONE-ACETAMINOPHEN 5-325 MG PO TABS
1.0000 | ORAL_TABLET | ORAL | Status: DC | PRN
  Administered 2012-10-15 – 2012-10-18 (×9): 2 via ORAL
  Filled 2012-10-15 (×10): qty 2

## 2012-10-15 MED ORDER — LIDOCAINE HCL (CARDIAC) 20 MG/ML IV SOLN
INTRAVENOUS | Status: DC | PRN
  Administered 2012-10-15: 50 mg via INTRAVENOUS

## 2012-10-15 MED ORDER — POLYETHYLENE GLYCOL 3350 17 G PO PACK
17.0000 g | PACK | Freq: Every day | ORAL | Status: DC | PRN
  Filled 2012-10-15: qty 1

## 2012-10-15 MED ORDER — HYDROMORPHONE HCL PF 1 MG/ML IJ SOLN
1.0000 mg | INTRAMUSCULAR | Status: DC | PRN
  Administered 2012-10-15 – 2012-10-19 (×11): 1 mg via INTRAVENOUS
  Filled 2012-10-15 (×11): qty 1

## 2012-10-15 SURGICAL SUPPLY — 32 items
BANDAGE GAUZE ELAST BULKY 4 IN (GAUZE/BANDAGES/DRESSINGS) IMPLANT
BLADE SURG 15 STRL LF DISP TIS (BLADE) ×1 IMPLANT
BLADE SURG 15 STRL SS (BLADE) ×2
CANISTER SUCTION 2500CC (MISCELLANEOUS) ×2 IMPLANT
CLOTH BEACON ORANGE TIMEOUT ST (SAFETY) ×2 IMPLANT
COVER SURGICAL LIGHT HANDLE (MISCELLANEOUS) ×1 IMPLANT
DECANTER SPIKE VIAL GLASS SM (MISCELLANEOUS) IMPLANT
DRAPE LAPAROSCOPIC ABDOMINAL (DRAPES) ×1 IMPLANT
DRSG PAD ABDOMINAL 8X10 ST (GAUZE/BANDAGES/DRESSINGS) IMPLANT
ELECT CAUTERY BLADE 6.4 (BLADE) ×1 IMPLANT
ELECT REM PT RETURN 9FT ADLT (ELECTROSURGICAL) ×2
ELECTRODE REM PT RTRN 9FT ADLT (ELECTROSURGICAL) ×1 IMPLANT
GLOVE BIO SURGEON STRL SZ7.5 (GLOVE) ×4 IMPLANT
GOWN STRL NON-REIN LRG LVL3 (GOWN DISPOSABLE) ×4 IMPLANT
KIT BASIN OR (CUSTOM PROCEDURE TRAY) ×2 IMPLANT
NDL HYPO 25X1 1.5 SAFETY (NEEDLE) IMPLANT
NEEDLE HYPO 25X1 1.5 SAFETY (NEEDLE) IMPLANT
NS IRRIG 1000ML POUR BTL (IV SOLUTION) ×2 IMPLANT
PACK GENERAL/GYN (CUSTOM PROCEDURE TRAY) ×1 IMPLANT
PENCIL BUTTON HOLSTER BLD 10FT (ELECTRODE) ×1 IMPLANT
SPONGE GAUZE 4X4 12PLY (GAUZE/BANDAGES/DRESSINGS) IMPLANT
SPONGE LAP 18X18 X RAY DECT (DISPOSABLE) ×1 IMPLANT
SUT MNCRL AB 4-0 PS2 18 (SUTURE) IMPLANT
SUT VIC AB 3-0 SH 27 (SUTURE)
SUT VIC AB 3-0 SH 27XBRD (SUTURE) IMPLANT
SWAB COLLECTION DEVICE MRSA (MISCELLANEOUS) IMPLANT
SYR BULB 3OZ (MISCELLANEOUS) ×1 IMPLANT
SYR CONTROL 10ML LL (SYRINGE) IMPLANT
TOWEL OR 17X26 10 PK STRL BLUE (TOWEL DISPOSABLE) ×2 IMPLANT
TUBE ANAEROBIC SPECIMEN COL (MISCELLANEOUS) IMPLANT
WATER STERILE IRR 1000ML POUR (IV SOLUTION) IMPLANT
YANKAUER SUCT BULB TIP NO VENT (SUCTIONS) ×1 IMPLANT

## 2012-10-15 NOTE — Op Note (Signed)
NAME:  [name] Mccoy, [name] Mccoy NO.:  [id number]  MEDICAL RECORD NO.:  [id number]  LOCATION:  1407                         FACILITY:  [facility]  PHYSICIAN:  Velora Heckler, MD      DATE OF BIRTH:  May 13, 1961  DATE OF PROCEDURE:  10/15/2012                               OPERATIVE REPORT   PREOPERATIVE DIAGNOSES: 1. Stage IV sacral decubitus ulcer. 2. Stage IV right hip decubitus ulcer.  POSTOPERATIVE DIAGNOSES: 1. Stage IV sacral decubitus ulcer. 2. Stage IV right hip decubitus ulcer.  PROCEDURES: 1. Debridement of sacral decubitus ulcer, 3 cm x 5 cm, full-thickness     skin and subcutaneous tissue. 2. Debridement of right hip decubitus ulcer, full thickness, 9 cm x 5     cm full-thickness skin and subcutaneous tissue.  SURGEON:  Velora Heckler, MD, FACS  ANESTHESIA:  General.  ESTIMATED BLOOD LOSS:  Minimal.  PREPARATION:  Betadine.  COMPLICATIONS:  None.  INDICATIONS:  The patient is a 52 year old male with cerebral palsy.  He has long-standing decubitus ulcers involving the lower back and sacrum and right hip.  These wounds have become undermined and are difficult for the wound care specialist to adequately debride and dress.  After review with the wound nurse consultant, a decision was made to proceed to the operating room for operative debridement, unroofing, and saucerization of the wounds.  DESCRIPTION OF PROCEDURE:  Procedure was done in OR #6 at the [facility]  The patient was brought to the operating room, placed in a supine position on the stretcher.  Following administration of general anesthesia, the patient was moved to the operating table and placed in a left lateral decubitus position on the bean bag.  After establishing an adequate position for the patient, he was prepped and draped in the usual aseptic fashion.  After ascertaining that an adequate level of anesthesia had been achieved, the sacral wound was addressed first.   The wound was clean.  However, inferiorly, there was some necrotic skin, necrotic subcutaneous tissue, and a moderate area which is undermined, making dressing changes difficult.  Using the electrocautery, a 3 cm x 5 cm full-thickness area of skin was debrided. Dissection was carried down to the base of the ulcer.  Hemostasis was achieved with the electrocautery.  The remainder of the wound was debrided and the electrocautery employed for hemostasis.  Next, we addressed the right hip decubitus ulcer.  Again, there are large areas which were widely undermined with necrotic tissue present. Using the electrocautery, an ellipse of skin around the wound was excised, full thickness with skin and subcutaneous tissue as well as underlying fascia and muscle being debrided.  Total area was 9 x 5 cm. Good hemostasis was achieved with the electrocautery.  Necrotic tissue from the base of the ulcer was also debrided and hemostasis was achieved with the electrocautery.  Both wounds were dressed with saline soaked Kerlix gauze sponge in a wet- to-dry fashion.  Good hemostasis was noted.  The patient was awakened from anesthesia and brought to the recovery room.  The patient tolerated the procedure well.   Velora Heckler, MD, [facility] Office:  [phone]    TMG/MEDQ  D:  10/15/2012  T:  10/15/2012  Job:  [id number]

## 2012-10-15 NOTE — Anesthesia Preprocedure Evaluation (Addendum)
Anesthesia Evaluation  Patient identified by MRN, date of birth, ID band Patient awake    Reviewed: Allergy & Precautions, H&P , NPO status , Patient's Chart, lab work & pertinent test results  Airway Mallampati: II TM Distance: >3 FB Neck ROM: Full    Dental no notable dental hx. (+) Teeth Intact   Pulmonary neg pulmonary ROS,  breath sounds clear to auscultation  Pulmonary exam normal       Cardiovascular negative cardio ROS  Rhythm:Regular Rate:Normal     Neuro/Psych Multiple sclerosis  Neuromuscular disease negative neurological ROS  negative psych ROS   GI/Hepatic negative GI ROS, Neg liver ROS,   Endo/Other  negative endocrine ROS  Renal/GU negative Renal ROS  negative genitourinary   Musculoskeletal negative musculoskeletal ROS (+)   Abdominal   Peds negative pediatric ROS (+)  Hematology negative hematology ROS (+)   Anesthesia Other Findings Emaciated. Appears chronically ill  Reproductive/Obstetrics negative OB ROS                           Anesthesia Physical Anesthesia Plan  ASA: III  Anesthesia Plan: General   Post-op Pain Management:    Induction: Intravenous  Airway Management Planned: Oral ETT  Additional Equipment:   Intra-op Plan:   Post-operative Plan: Extubation in OR  Informed Consent: I have reviewed the patients History and Physical, chart, labs and discussed the procedure including the risks, benefits and alternatives for the proposed anesthesia with the patient or authorized representative who has indicated his/her understanding and acceptance.   Dental advisory given  Plan Discussed with: CRNA  Anesthesia Plan Comments: (Avoid Sux if possible)       Anesthesia Quick Evaluation

## 2012-10-15 NOTE — Progress Notes (Addendum)
Patient was tachycardic. Heart rate was 142.  Vitals were:69F,HR 129, RR 18, 93/69. Patient was making a telephone call at the time of elevated heart rate. Will continue to monitor patient.

## 2012-10-15 NOTE — Progress Notes (Addendum)
[facility] PROGRESS NOTE  [name] Mccoy AOZ:[id number] DOB: 25-Aug-1960 DOA: 10/12/2012 PCP: Londell Moh, MD  Assessment/Plan: SEPSIS: Patient did meet overall sepsis criteria though patient is clinically stable.  broad-spectrum antibiotics with vancomycin and Levaquin. Urinalysis has mod LE. Blood and urine cultures pending. Chest x-ray negative for any focal infiltrate. Wound cultures.  trend white count. Some concern for wound source of symptoms. Sacral/lungs overall stable currently. Surgery consultation along with wound care consult ?LTAC  Hyponatremia: Hypovolemic clinically. Dry on exam. Will hydrate patient and reassess. Continue to follow closely.   Hypokalemia- replete  Leukocytosis: White blood cell count markedly elevated from recent hospital discharge. Place on broad spectrum antibiotics. Pending blood/urinecultures- no reported diarrhea   Pressure ulcers: Per the caregiver, pressures are somewhat near to baseline, wound care- s/p debridement  Anemia: Baseline hemoglobin nonslipping. At baseline today. Suspect likely anemia of chronic disease. Continue to follow clinically. Transfuse < 7  Multiple sclerosis: Clinically at baseline. Continue home regimen.   Thrombocytosis: Acute or chronic issue. Elevated at last admission. Suspect is likely reactive component to this. Continue clinically follow with hydration.   Code Status: full Family Communication: patient at bedside Disposition Plan: tx out SDU to floor   Consultants:  Surgery  Wound care  Procedures:  debridement  Antibiotics:  vanc/levaquin  HPI/Subjective: feeling better  Objective: Filed Vitals:   10/15/12 1030 10/15/12 1045 10/15/12 1125 10/15/12 1205  BP: 98/67  95/69 95/69  Pulse: 102  113 113  Temp: 97.9 F (36.6 C)  97.9 F (36.6 C) 97.9 F (36.6 C)  TempSrc:    Oral  Resp: 15  14 16   Height:      Weight:      SpO2: 100% 95% 98% 98%    Intake/Output Summary (Last  24 hours) at 10/15/12 1315 Last data filed at 10/15/12 1206  Gross per 24 hour  Intake   2820 ml  Output   2300 ml  Net    520 ml   Filed Weights   10/13/12 0322 10/14/12 1336  Weight: 60.9 kg (134 lb 4.2 oz) 63.2 kg (139 lb 5.3 oz)    Exam:   General:  NAD, laying in bed eating breakfast  Cardiovascular: rrr  Respiratory: clear anterior  Abdomen: +BS, soft, NT/ND  Musculoskeletal: weak LE  Data Reviewed: Basic Metabolic Panel:  Recent Labs Lab 10/12/12 1904 10/13/12 0538 10/14/12 0755  NA 133* 132* 136  K 4.7 3.4* 3.4*  CL 93* 98 103  CO2 24 22 25   GLUCOSE 96 108* 102*  BUN 8 7 7   CREATININE 0.51 0.44* 0.47*  CALCIUM 9.4 8.2* 8.2*   Liver Function Tests:  Recent Labs Lab 10/12/12 1904 10/13/12 0538  AST 24 13  ALT 9 7  ALKPHOS 85 69  BILITOT 1.0 0.7  PROT 9.4* 7.2  ALBUMIN 2.6* 2.0*   No results found for this basename: LIPASE, AMYLASE,  in the last 168 hours No results found for this basename: AMMONIA,  in the last 168 hours CBC:  Recent Labs Lab 10/12/12 1904 10/13/12 0538 10/14/12 0755 10/15/12 0440  WBC 17.7* 17.9* 10.6* 11.5*  NEUTROABS 14.4* 14.6*  --   --   HGB 10.8* 7.9* 7.8* 7.3*  HCT 33.0* 24.8* 24.5* 24.0*  MCV 74.7* 74.3* 75.4* 76.2*  PLT 829* 624* 723* 678*   Cardiac Enzymes: No results found for this basename: CKTOTAL, CKMB, CKMBINDEX, TROPONINI,  in the last 168 hours BNP (last 3 results) No results found for this basename:  PROBNP,  in the last 8760 hours CBG: No results found for this basename: GLUCAP,  in the last 168 hours  Recent Results (from the past 240 hour(s))  MRSA PCR SCREENING     Status: None   Collection Time    10/13/12  3:12 AM      Result Value Range Status   MRSA by PCR NEGATIVE  NEGATIVE Final   Comment:            The GeneXpert MRSA Assay (FDA     approved for NASAL specimens     only), is one component of a     comprehensive MRSA colonization     surveillance program. It is not     intended  to diagnose MRSA     infection nor to guide or     monitor treatment for     MRSA infections.  WOUND CULTURE     Status: None   Collection Time    10/13/12  5:00 AM      Result Value Range Status   Specimen Description WOUND RT HIP   Final   Special Requests Normal   Final   Gram Stain     Final   Value: ABUNDANT WBC PRESENT, PREDOMINANTLY PMN     NO SQUAMOUS EPITHELIAL CELLS SEEN     MODERATE GRAM POSITIVE COCCI     IN PAIRS   Culture Culture reincubated for better growth   Final   Report Status PENDING   Incomplete  WOUND CULTURE     Status: None   Collection Time    10/13/12  5:00 AM      Result Value Range Status   Specimen Description WOUND SACRAL   Final   Special Requests NONE   Final   Gram Stain     Final   Value: ABUNDANT WBC PRESENT, PREDOMINANTLY PMN     NO SQUAMOUS EPITHELIAL CELLS SEEN     ABUNDANT GRAM POSITIVE COCCI     IN PAIRS MODERATE GRAM NEGATIVE RODS   Culture     Final   Value: MULTIPLE ORGANISMS PRESENT, NONE PREDOMINANT     Note: NO STAPHYLOCOCCUS AUREUS ISOLATED NO GROUP A STREP (S.PYOGENES) ISOLATED   Report Status 10/15/2012 FINAL   Final  CULTURE, BLOOD (ROUTINE X 2)     Status: None   Collection Time    10/13/12  5:38 AM      Result Value Range Status   Specimen Description BLOOD RIGHT ARM   Final   Special Requests BOTTLES DRAWN AEROBIC AND ANAEROBIC 5CC   Final   Culture  Setup Time 10/13/2012 08:41   Final   Culture     Final   Value:        BLOOD CULTURE RECEIVED NO GROWTH TO DATE CULTURE WILL BE HELD FOR 5 DAYS BEFORE ISSUING A FINAL NEGATIVE REPORT   Report Status PENDING   Incomplete  CULTURE, BLOOD (ROUTINE X 2)     Status: None   Collection Time    10/13/12  5:40 AM      Result Value Range Status   Specimen Description BLOOD RIGHT HAND   Final   Special Requests BOTTLES DRAWN AEROBIC AND ANAEROBIC 5CC   Final   Culture  Setup Time 10/13/2012 08:41   Final   Culture     Final   Value:        BLOOD CULTURE RECEIVED NO GROWTH TO  DATE CULTURE WILL BE HELD FOR 5 DAYS BEFORE  ISSUING A FINAL NEGATIVE REPORT   Report Status PENDING   Incomplete  SURGICAL PCR SCREEN     Status: None   Collection Time    10/15/12  4:04 AM      Result Value Range Status   MRSA, PCR NEGATIVE  NEGATIVE Final   Staphylococcus aureus NEGATIVE  NEGATIVE Final   Comment:            The Xpert SA Assay (FDA     approved for NASAL specimens     in patients over 51 years of age),     is one component of     a comprehensive surveillance     program.  Test performance has     been validated by [employer] for patients greater     than or equal to 96 year old.     It is not intended     to diagnose infection nor to     guide or monitor treatment.     Studies: No results found.  Scheduled Meds: . amantadine  100 mg Oral BID  . collagenase   Topical Daily  . dantrolene  25 mg Oral TID  . ezetimibe  10 mg Oral Daily  . feeding supplement  237 mL Oral TID BM  . gabapentin  600 mg Oral QID  . Interferon Beta-1b  0.3 mg Subcutaneous QODAY  . levofloxacin (LEVAQUIN) IV  750 mg Intravenous Q24H  . modafinil  200 mg Oral BID  . multivitamin with minerals  1 tablet Oral Daily  . nutrition supplement  1 packet Oral BID PC  . tiZANidine  4 mg Oral TID PC & HS  . vancomycin  1,250 mg Intravenous Q12H   Continuous Infusions: . sodium chloride 75 mL/hr at 10/14/12 0809  . dextrose 5 % and 0.45 % NaCl with KCl 20 mEq/L 1,000 mL (10/15/12 1210)    Active Problems:   Multiple sclerosis   Wounds, multiple   Hypokalemia   Anemia   Pressure ulcer, buttock(707.05)   Leukocytosis, unspecified    Time spent: 35 min    Terriah Reggio  [facility] Pager [phone]. If 7PM-7AM, please contact night-coverage at [email or web address], password [facility] 10/15/2012, 1:15 PM  LOS: 3 days

## 2012-10-15 NOTE — Progress Notes (Signed)
Clinical Social Work Department BRIEF PSYCHOSOCIAL ASSESSMENT 10/15/2012  Patient:  [name] Mccoy, [name] Mccoy     Account Number:  [id number]     Admit date:  10/12/2012  Clinical Social Worker:  Hattie Perch  Date/Time:  10/15/2012 12:00 M  Referred by:  Physician  Date Referred:  10/15/2012 Referred for  SNF Placement   Other Referral:   Interview type:  Patient Other interview type:    PSYCHOSOCIAL DATA Living Status:  ALONE Admitted from facility:   Level of care:   Primary support name:  Sloan Leiter Primary support relationship to patient:  FRIEND Degree of support available:   fair    CURRENT CONCERNS Current Concerns  Post-Acute Placement   Other Concerns:    SOCIAL WORK ASSESSMENT / PLAN CSW met with patient. patient is alert and oriented X3. patient in need of snf placement. patient now has blue cross/blue shield. patient is agreeable to being faxed out and recieving bed offers.   Assessment/plan status:   Other assessment/ plan:   Information/referral to community resources:    PATIENT'S/FAMILY'S RESPONSE TO PLAN OF CARE: patient is hopeful to go to snf this admission and is agreeable to being faxed out.

## 2012-10-15 NOTE — Progress Notes (Signed)
Pt. Complained of nausea and vomiting and "feeling full". Pt has been on a reg diet since 0800 and tolerated well. Pt had no bowel movement all day. Paged MD on call. Orders written for pt to become NPO and ice chips only. Fluids increased to 75 ml/hr.

## 2012-10-15 NOTE — Brief Op Note (Signed)
10/12/2012 - 10/15/2012  10:10 AM  PATIENT:  [name] Mccoy  52 y.o. male  PRE-OPERATIVE DIAGNOSIS:  right hip and sacral decubitus ulcers  POST-OPERATIVE DIAGNOSIS:  same  PROCEDURE:  Procedure(s) with comments: DEBRIDMENT OF RIGHT HIP DECUBITUS AND SACRAL DECUBITUS (Right) - left lateral position, right hip  SURGEON:  Surgeon(s) and Role:    * Velora Heckler, MD - Primary  ANESTHESIA:   general  EBL:  Total I/O In: 1000 [I.V.:1000] Out: 400 [Urine:400]  BLOOD ADMINISTERED:none  DRAINS: none   LOCAL MEDICATIONS USED:  NONE  SPECIMEN:  Excision  DISPOSITION OF SPECIMEN:  PATHOLOGY  COUNTS:  YES  TOURNIQUET:  * No tourniquets in log *  DICTATION: .Other Dictation: Dictation Number [id number]  PLAN OF CARE: Admit to inpatient   PATIENT DISPOSITION:  PACU - hemodynamically stable.   Delay start of Pharmacological VTE agent (>24hrs) due to surgical blood loss or risk of bleeding: yes  Velora Heckler, MD, [facility] Office: [phone]

## 2012-10-15 NOTE — Care Management Note (Addendum)
    Page 1 of 2   10/23/2012     4:44:19 PM   CARE MANAGEMENT NOTE 10/23/2012  Patient:  [name] Mccoy, [name] Mccoy   Account Number:  [id number]  Date Initiated:  10/13/2012  Documentation initiated by:  DAVIS,RHONDA  Subjective/Objective Assessment:   pt with hisotry of ms, contractures of the legs, decubtis ulcers , was recently admitted for urosepsis stablized and sent home. readmitted with sirs     Action/Plan:   has a caregiver in the home.  There is some concern by [facility] that patient is not being adequately attended to.   Anticipated DC Date:  10/22/2012   Anticipated DC Plan:  SKILLED NURSING FACILITY  In-house referral  NA      DC Planning Services  CM consult      Choice offered to / List presented to:  NA        HH arranged  NA      Status of service:  Completed, signed off Medicare Important Message given?  NA - LOS <3 / Initial given by admissions (If response is "NO", the following Medicare IM given date fields will be blank) Date Medicare IM given:   Date Additional Medicare IM given:    Discharge Disposition:  SKILLED NURSING FACILITY  Per UR Regulation:  Reviewed for med. necessity/level of care/duration of stay  If discussed at Long Length of Stay Meetings, dates discussed:   10/19/2012  10/21/2012    Comments:  10/23/12 Lauraann Missey RN,BSN NCM [facility] [phone] D/C 10/22/12 D/C SNF.  10/21/12 Pebbles Zeiders RN,BSN NCM [phone] STABLE FOR D/C AWAITING AUTH SNF.  10/19/12 Arbell Wycoff RN,BSN NCM [phone] POD#3 PRESSURE ULCER DEBRIDEMENT.SEPSIS,MSSA,R HIP,SACRUM W-D DSG.LEVAQUIN IV.PER MD MEDICALLY STABLE FOR D/C SNF.  10/18/12 Nico Syme RN,BSN NCM [phone] LTAC APPROPRIATE BUT PATIENT OUT OF POCKET COST IS $11,000(PATIENT DECLINED TO PAY THIS AMOUNT)[facility] REP AWARE OF PATIENT'S DECISION.AWAIT CX.MAY D/C IN AM SNF.  10/15/12 Verlean Allport RN,BSN NCM [phone] 4:20P INSURANCE VERIFIED,& NOW THE PRIMARY INSURANCE IS BC[employer].[facility] REP TOMMY TEL#[phone]  [phone] WHO SAYS PATIENT MEETS CRITERIA,THEY WILL CONTACT INSURANCE COMPANY FOR WHETHER THEY WILL AUTH-WILL LET ME KNOW ON MONDAY.MD UPDATED. CONTACTED BILING ABOUT THE PRIMARY INSURANCE SHOWING [employer],NOW CORRECTED TO SHOW MEDICAID [place] ACCESS.[facility] ACTIVE,BUT RECOMMENDED SNF SINCE THEY WERE WORKING ON THAT PTA.WILL CHECK ON LTAC-[facility] IF APPROPRIATE.PT-HYDROTHERAPY, WOC CONS.  [id number] Earlene Plater, RN, BSN, CCM:  CHART REVIEWED AND UPDATED. per Baxter Hire at [facility] csw is looking for snf placement.  CSW for hospital is aware. Next chart review due on [id number]. NO DISCHARGE NEEDS PRESENT AT THIS TIME. CASE MANAGEMENT [phone]

## 2012-10-15 NOTE — Progress Notes (Signed)
Calorie Count Note  48 hour calorie count ordered.  Diet: Regular Supplements: Ensure Complete, Juven  Breakfast 5/30: None; Pt NPO for procedure Lunch 5/29: 224 kcal, 15 grams of protein Dinner: 994 kcal, 41 grams protein Supplements: 350 kcal, 13 grams protein  Total intake: 1568 kcal (86% of minimum estimated needs)  69 grams protein (76% of minimum estimated needs)  Nutrition Dx: Increased nutrient needs related to underweight and wound healing as evidenced by pt with multiple stage 3 pressure ulcers and BMI of 16.8; ongoing   Goal: Pt to meet >/= 90% of their estimated nutrition needs; not met Pt likely will meet goal if able to eat 3 meals but, pt was NPO for breakfast.  Intervention: Continue Ensure Complete TID Continue Juven BID Continue Multivitamin with minerals daily Provide Magic Cup once daily Recommend Vitamin C and zinc supplements daily  Ian Malkin RD, LDN Inpatient Clinical Dietitian Pager: [phone] After Hours Pager: [phone]

## 2012-10-15 NOTE — Transfer of Care (Signed)
Immediate Anesthesia Transfer of Care Note  Patient: [name] Mccoy  Procedure(s) Performed: Procedure(s) with comments: DEBRIDMENT OF RIGHT HIP DECUBITUS AND SACRAL DECUBITUS (Right) - left lateral position, right hip  Patient Location: PACU  Anesthesia Type:General  Level of Consciousness: awake, alert  and oriented  Airway & Oxygen Therapy: Patient Spontanous Breathing and Patient connected to face mask oxygen  Post-op Assessment: Report given to PACU RN and Post -op Vital signs reviewed and stable  Post vital signs: Reviewed and stable  Complications: No apparent anesthesia complications

## 2012-10-15 NOTE — Anesthesia Postprocedure Evaluation (Signed)
  Anesthesia Post-op Note  Patient: [name] Mccoy  Procedure(s) Performed: Procedure(s) (LRB): DEBRIDMENT OF RIGHT HIP DECUBITUS AND SACRAL DECUBITUS (Right)  Patient Location: PACU  Anesthesia Type: General  Level of Consciousness: awake and alert   Airway and Oxygen Therapy: Patient Spontanous Breathing  Post-op Pain: mild  Post-op Assessment: Post-op Vital signs reviewed, Patient's Cardiovascular Status Stable, Respiratory Function Stable, Patent Airway and No signs of Nausea or vomiting  Last Vitals:  Filed Vitals:   10/15/12 1205  BP: 95/69  Pulse: 113  Temp: 36.6 C  Resp: 16    Post-op Vital Signs: stable   Complications: No apparent anesthesia complications

## 2012-10-15 NOTE — Progress Notes (Addendum)
PT Cancellation Note  Patient Details Name: [name] Mccoy MRN: [id number] DOB: June 25, 1960   Cancelled Treatment:     Pt had surgical debridement of sacral and R hip pressure ulcers this morning.  RN contacted MD in regards to continuing hydrotherapy for sacral ulcer and reports MD stated discontinuing hydrotherapy at this time.  Please re-order if needed.  Thanks.  After writing above note, new order for checking on pt 6/2.  Will check back Monday.   Joylyn Duggin,KATHrine E 10/15/2012, 2:07 PM Zenovia Jarred, PT, DPT 10/15/2012 Pager: [phone]

## 2012-10-16 MED ORDER — ACETAMINOPHEN 325 MG PO TABS
650.0000 mg | ORAL_TABLET | Freq: Four times a day (QID) | ORAL | Status: DC | PRN
  Administered 2012-10-16: 650 mg via ORAL
  Filled 2012-10-16: qty 1

## 2012-10-16 MED ORDER — ACETAMINOPHEN 325 MG PO TABS
ORAL_TABLET | ORAL | Status: AC
  Filled 2012-10-16: qty 2

## 2012-10-16 NOTE — Progress Notes (Addendum)
[facility] PROGRESS NOTE  JAMAN ARO WUJ:[id number] DOB: [birth year] DOA: 10/12/2012 PCP: Londell Moh, MD  Assessment/Plan: SEPSIS: Patient did meet overall sepsis criteria though patient is clinically stable.  Levaquin. Urinalysis has mod LE. Blood and urine cultures pending. Chest x-ray negative for any focal infiltrate. Wound cultures.  trend white count. Some concern for wound source of symptoms. Sacral/lungs overall stable currently. Surgery consultation along with wound care consult ?LTAC vs SNF  Hyponatremia: Hypovolemic clinically. Dry on exam. Will hydrate patient and reassess. Continue to follow closely.   Hypokalemia- replete  Leukocytosis: White blood cell count markedly elevated from recent hospital discharge. Place on broad spectrum antibiotics day #5/7. Pending blood/urine cultures- no reported diarrhea  No fever x 2 days  Pressure ulcers: Per the caregiver, pressures are somewhat near to baseline, wound care- s/p debridement  Anemia: Baseline hemoglobin nonslipping. At baseline today. Suspect likely anemia of chronic disease. Continue to follow clinically. Transfuse < 7  Multiple sclerosis: Clinically at baseline. Continue home regimen.   Thrombocytosis: Acute or chronic issue. Elevated at last admission. Suspect is likely reactive component to this. Continue clinically follow with hydration.   Code Status: full Family Communication: patient at bedside Disposition Plan: tx out SDU to floor   Consultants:  Surgery  Wound care  Procedures:  debridement  Antibiotics:  vanc/levaquin  HPI/Subjective: Eating well  Objective: Filed Vitals:   10/15/12 2140 10/16/12 0201 10/16/12 0458 10/16/12 0630  BP: 91/64 99/65  90/62  Pulse: 126 122 122   Temp: 98.2 F (36.8 C) 98.1 F (36.7 C) 98.2 F (36.8 C)   TempSrc: Oral Oral Oral   Resp: 16 16 16    Height:      Weight:      SpO2: 100% 100% 96%     Intake/Output Summary (Last 24  hours) at 10/16/12 1052 Last data filed at 10/16/12 0930  Gross per 24 hour  Intake 3537.5 ml  Output   1975 ml  Net 1562.5 ml   Filed Weights   10/13/12 0322 10/14/12 1336  Weight: 60.9 kg (134 lb 4.2 oz) 63.2 kg (139 lb 5.3 oz)    Exam:   General:  NAD, laying in bed eating breakfast  Cardiovascular: rrr  Respiratory: clear anterior  Abdomen: +BS, soft, NT/ND  Musculoskeletal: weak LE  Data Reviewed: Basic Metabolic Panel:  Recent Labs Lab 10/12/12 1904 10/13/12 0538 10/14/12 0755  NA 133* 132* 136  K 4.7 3.4* 3.4*  CL 93* 98 103  CO2 24 22 25   GLUCOSE 96 108* 102*  BUN 8 7 7   CREATININE 0.51 0.44* 0.47*  CALCIUM 9.4 8.2* 8.2*   Liver Function Tests:  Recent Labs Lab 10/12/12 1904 10/13/12 0538  AST 24 13  ALT 9 7  ALKPHOS 85 69  BILITOT 1.0 0.7  PROT 9.4* 7.2  ALBUMIN 2.6* 2.0*   No results found for this basename: LIPASE, AMYLASE,  in the last 168 hours No results found for this basename: AMMONIA,  in the last 168 hours CBC:  Recent Labs Lab 10/12/12 1904 10/13/12 0538 10/14/12 0755 10/15/12 0440  WBC 17.7* 17.9* 10.6* 11.5*  NEUTROABS 14.4* 14.6*  --   --   HGB 10.8* 7.9* 7.8* 7.3*  HCT 33.0* 24.8* 24.5* 24.0*  MCV 74.7* 74.3* 75.4* 76.2*  PLT 829* 624* 723* 678*   Cardiac Enzymes: No results found for this basename: CKTOTAL, CKMB, CKMBINDEX, TROPONINI,  in the last 168 hours BNP (last 3 results) No results found for this basename:  PROBNP,  in the last 8760 hours CBG: No results found for this basename: GLUCAP,  in the last 168 hours  Recent Results (from the past 240 hour(s))  MRSA PCR SCREENING     Status: None   Collection Time    10/13/12  3:12 AM      Result Value Range Status   MRSA by PCR NEGATIVE  NEGATIVE Final   Comment:            The GeneXpert MRSA Assay (FDA     approved for NASAL specimens     only), is one component of a     comprehensive MRSA colonization     surveillance program. It is not     intended to  diagnose MRSA     infection nor to guide or     monitor treatment for     MRSA infections.  WOUND CULTURE     Status: None   Collection Time    10/13/12  5:00 AM      Result Value Range Status   Specimen Description WOUND RT HIP   Final   Special Requests Normal   Final   Gram Stain     Final   Value: ABUNDANT WBC PRESENT, PREDOMINANTLY PMN     NO SQUAMOUS EPITHELIAL CELLS SEEN     MODERATE GRAM POSITIVE COCCI     IN PAIRS   Culture     Final   Value: FEW STAPHYLOCOCCUS AUREUS     Note: RIFAMPIN AND GENTAMICIN SHOULD NOT BE USED AS SINGLE DRUGS FOR TREATMENT OF STAPH INFECTIONS.   Report Status PENDING   Incomplete  WOUND CULTURE     Status: None   Collection Time    10/13/12  5:00 AM      Result Value Range Status   Specimen Description WOUND SACRAL   Final   Special Requests NONE   Final   Gram Stain     Final   Value: ABUNDANT WBC PRESENT, PREDOMINANTLY PMN     NO SQUAMOUS EPITHELIAL CELLS SEEN     ABUNDANT GRAM POSITIVE COCCI     IN PAIRS MODERATE GRAM NEGATIVE RODS   Culture     Final   Value: MULTIPLE ORGANISMS PRESENT, NONE PREDOMINANT     Note: NO STAPHYLOCOCCUS AUREUS ISOLATED NO GROUP A STREP (S.PYOGENES) ISOLATED   Report Status 10/15/2012 FINAL   Final  CULTURE, BLOOD (ROUTINE X 2)     Status: None   Collection Time    10/13/12  5:38 AM      Result Value Range Status   Specimen Description BLOOD RIGHT ARM   Final   Special Requests BOTTLES DRAWN AEROBIC AND ANAEROBIC 5CC   Final   Culture  Setup Time 10/13/2012 08:41   Final   Culture     Final   Value:        BLOOD CULTURE RECEIVED NO GROWTH TO DATE CULTURE WILL BE HELD FOR 5 DAYS BEFORE ISSUING A FINAL NEGATIVE REPORT   Report Status PENDING   Incomplete  CULTURE, BLOOD (ROUTINE X 2)     Status: None   Collection Time    10/13/12  5:40 AM      Result Value Range Status   Specimen Description BLOOD RIGHT HAND   Final   Special Requests BOTTLES DRAWN AEROBIC AND ANAEROBIC 5CC   Final   Culture  Setup Time  10/13/2012 08:41   Final   Culture     Final  Value:        BLOOD CULTURE RECEIVED NO GROWTH TO DATE CULTURE WILL BE HELD FOR 5 DAYS BEFORE ISSUING A FINAL NEGATIVE REPORT   Report Status PENDING   Incomplete  SURGICAL PCR SCREEN     Status: None   Collection Time    10/15/12  4:04 AM      Result Value Range Status   MRSA, PCR NEGATIVE  NEGATIVE Final   Staphylococcus aureus NEGATIVE  NEGATIVE Final   Comment:            The Xpert SA Assay (FDA     approved for NASAL specimens     in patients over 90 years of age),     is one component of     a comprehensive surveillance     program.  Test performance has     been validated by [employer] for patients greater     than or equal to 28 year old.     It is not intended     to diagnose infection nor to     guide or monitor treatment.     Studies: No results found.  Scheduled Meds: . amantadine  100 mg Oral BID  . collagenase   Topical Daily  . dantrolene  25 mg Oral TID  . ezetimibe  10 mg Oral Daily  . feeding supplement  237 mL Oral TID BM  . gabapentin  600 mg Oral QID  . heparin  5,000 Units Subcutaneous Q8H  . Interferon Beta-1b  0.3 mg Subcutaneous QODAY  . levofloxacin (LEVAQUIN) IV  750 mg Intravenous Q24H  . modafinil  200 mg Oral BID  . multivitamin with minerals  1 tablet Oral Daily  . nutrition supplement  1 packet Oral BID PC  . tiZANidine  4 mg Oral TID PC & HS  . vancomycin  1,250 mg Intravenous Q12H   Continuous Infusions: . sodium chloride 75 mL/hr at 10/14/12 0809  . dextrose 5 % and 0.45 % NaCl with KCl 20 mEq/L 75 mL/hr at 10/16/12 1308    Active Problems:   Multiple sclerosis   Wounds, multiple   Hypokalemia   Anemia   Pressure ulcer, buttock(707.05)   Leukocytosis, unspecified    Time spent: 35 min    VANN, JESSICA  [facility] Pager [phone]. If 7PM-7AM, please contact night-coverage at [email or web address], password [facility] 10/16/2012, 10:52 AM  LOS: 4 days

## 2012-10-16 NOTE — Progress Notes (Signed)
CMT notified the RN that the patient's heart rate was elevated. RN notified CMT to say that the patient was being repositioned and bathed.

## 2012-10-16 NOTE — Progress Notes (Signed)
Patient had a complete bath and all dressings were changed. Both on the right hip and the sacral area. Patient was given pain medication prior to dressing changes which enabled patient to tolerate procedure better than if he had no pain medication.

## 2012-10-17 LAB — CBC
HCT: 24.7 % — ABNORMAL LOW (ref 39.0–52.0)
MCH: 23 pg — ABNORMAL LOW (ref 26.0–34.0)
MCHC: 30.4 g/dL (ref 30.0–36.0)
MCV: 75.8 fL — ABNORMAL LOW (ref 78.0–100.0)
Platelets: 717 10*3/uL — ABNORMAL HIGH (ref 150–400)
RDW: 16 % — ABNORMAL HIGH (ref 11.5–15.5)

## 2012-10-17 LAB — BASIC METABOLIC PANEL
BUN: 14 mg/dL (ref 6–23)
Calcium: 8.2 mg/dL — ABNORMAL LOW (ref 8.4–10.5)
Creatinine, Ser: 0.54 mg/dL (ref 0.50–1.35)
GFR calc Af Amer: >90 mL/min (ref >90)

## 2012-10-17 LAB — WOUND CULTURE

## 2012-10-17 NOTE — Progress Notes (Signed)
ANTIBIOTIC CONSULT NOTE - FOLLOW UP  Pharmacy Consult for Vancomycin Indication: R/O Sepsis (decubital/pressure ulcers)  No Known Allergies  Patient Measurements: Height: 6\' 3"  (190.5 cm) Weight: 139 lb 5.3 oz (63.2 kg) IBW/kg (Calculated) : 84.5   Vital Signs: Temp: 98.2 F (36.8 C) (06/01 0623) Temp src: Oral (06/01 0623) BP: 85/54 mmHg (06/01 0623) Pulse Rate: 111 (06/01 0623) Intake/Output from previous day: 05/31 0701 - 06/01 0700 In: 1800 [P.O.:600; I.V.:950; IV Piggyback:250] Out: 1325 [Urine:1325] Intake/Output from this shift:    Labs:  Recent Labs  10/15/12 0440 10/17/12 0516  WBC 11.5* 15.6*  HGB 7.3* 7.5*  PLT 678* 717*  CREATININE  --  0.54   Estimated Creatinine Clearance: 98.8 ml/min (by C-G formula based on Cr of 0.54).  Recent Labs  10/14/12 2105  VANCOTROUGH 19.0      Assessment: 52 yom with h/o MS, chronic decubitus ulcer discharged from [facility] 09/21/12 (staph aureus decubitus ulcer, possible UTI) brought to ED on 5/27 by EMS given home health nurse concern for dehydration/weakness. In ED, found Tmax 101.9, tachycardic, w/ leukocytosis and 2 stage IV decubitus ulcers. CXR negative for infiltrate. Concerning for UTI vs wound infection. Vanc per Rx, Levaquin per MD started 5/28. Surgery on board  Today is D#5 Vancomycin, Levaquin.  Rising WBC but afebrile  Wound cx grew pan-sensitive MSSA  Goal of Therapy:  Vancomycin trough level 15-20 mcg/ml  Plan:  Continue Vanc 1250 mg IV q12h for now, BUT recommend D/C of vanc since MSSA growing and continue Levaquin per sensitivity results   Hessie Knows, PharmD, BCPS Pager [phone] 10/17/2012 10:06 AM

## 2012-10-17 NOTE — Progress Notes (Signed)
Patient ID: [name] Mccoy, male   DOB: 07-Apr-1961, 52 y.o.   MRN: [id number] [facility] Progress Note:   2 Days Post-Op  Subjective: Mental status is clear Objective: Vital signs in last 24 hours: Temp:  [98.2 F (36.8 C)-101 F (38.3 C)] 98.2 F (36.8 C) (06/01 0623) Pulse Rate:  [111-147] 111 (06/01 0623) Resp:  [12-16] 12 (06/01 0623) BP: (85-95)/(54-61) 85/54 mmHg (06/01 0623) SpO2:  [90 %-99 %] 90 % (06/01 0623)  Intake/Output from previous day: 05/31 0701 - 06/01 0700 In: 1800 [P.O.:600; I.V.:950; IV Piggyback:250] Out: 1325 [Urine:1325] Intake/Output this shift:    Physical Exam: Work of breathing is not labored.  Having daily dressing changes.    Lab Results:  Results for orders placed during the hospital encounter of 10/12/12 (from the past 48 hour(s))  CBC     Status: Abnormal   Collection Time    10/17/12  5:16 AM      Result Value Range   WBC 15.6 (*) 4.0 - 10.5 K/uL   RBC 3.26 (*) 4.22 - 5.81 MIL/uL   Hemoglobin 7.5 (*) 13.0 - 17.0 g/dL   HCT 40.9 (*) 81.1 - 91.4 %   MCV 75.8 (*) 78.0 - 100.0 fL   MCH 23.0 (*) 26.0 - 34.0 pg   MCHC 30.4  30.0 - 36.0 g/dL   RDW 78.2 (*) 95.6 - 21.3 %   Platelets 717 (*) 150 - 400 K/uL  BASIC METABOLIC PANEL     Status: Abnormal   Collection Time    10/17/12  5:16 AM      Result Value Range   Sodium 133 (*) 135 - 145 mEq/L   Potassium 4.1  3.5 - 5.1 mEq/L   Chloride 99  96 - 112 mEq/L   CO2 27  19 - 32 mEq/L   Glucose, Bld 118 (*) 70 - 99 mg/dL   BUN 14  6 - 23 mg/dL   Creatinine, Ser 0.86  0.50 - 1.35 mg/dL   Calcium 8.2 (*) 8.4 - 10.5 mg/dL   GFR calc non Af Amer >90  >90 mL/min   GFR calc Af Amer >90  >90 mL/min   Comment:            The eGFR has been calculated     using the CKD EPI equation.     This calculation has not been     validated in all clinical     situations.     eGFR's persistently     <90 mL/min signify     possible Chronic Kidney Disease.    Radiology/Results: No results  found.  Anti-infectives: Anti-infectives   Start     Dose/Rate Route Frequency Ordered Stop   10/15/12 0000  levofloxacin (LEVAQUIN) IVPB 750 mg     750 mg 100 mL/hr over 90 Minutes Intravenous Every 24 hours 10/14/12 1233     10/13/12 1000  vancomycin (VANCOCIN) 1,250 mg in sodium chloride 0.9 % 250 mL IVPB     1,250 mg 166.7 mL/hr over 90 Minutes Intravenous Every 12 hours 10/13/12 0120     10/13/12 0115  levofloxacin (LEVAQUIN) IVPB 500 mg  Status:  Discontinued     500 mg 100 mL/hr over 60 Minutes Intravenous Every 24 hours 10/13/12 0104 10/14/12 1233   10/12/12 2315  vancomycin (VANCOCIN) IVPB 1000 mg/200 mL premix     1,000 mg 200 mL/hr over 60 Minutes Intravenous  Once 10/12/12 2301 10/13/12 0112   10/12/12 2315  cefTRIAXone (ROCEPHIN) 1 g in dextrose 5 % 50 mL IVPB     1 g 100 mL/hr over 30 Minutes Intravenous  Once 10/12/12 2301 10/13/12 0039      Assessment/Plan: Problem List: Patient Active Problem List   Diagnosis Date Noted  . Leukocytosis, unspecified 10/13/2012  . Pressure ulcer, buttock(707.05)   . Pain in joint, pelvic region and thigh   . Spasm of muscle   . Unspecified vitamin D deficiency   . Sepsis 09/17/2012  . Hypotension 09/16/2012  . Tachycardia 09/16/2012  . SIRS (systemic inflammatory response syndrome) 09/16/2012  . UTI (urinary tract infection) 09/16/2012  . Hypokalemia 09/16/2012  . Anemia 09/16/2012  . Thrombocytosis 08/02/2011  . Fecal impaction 07/30/2011  . Normocytic anemia 07/30/2011  . Fall 07/30/2011  . Wounds, multiple 07/30/2011  . Rhabdomyolysis 07/26/2011  . Multiple sclerosis 07/26/2011  . Dehydration 07/26/2011    No apparent complications from ulcer debridement per Dr. Gerrit Friends.  Surgery will see again prn.  2 Days Post-Op    LOS: 5 days   Matt B. Daphine Deutscher, MD, [facility] [phone] beeper [phone]  10/17/2012 8:42 AM

## 2012-10-17 NOTE — Progress Notes (Signed)
[facility] PROGRESS NOTE  [name] Mccoy:[id number] DOB: [birth year] DOA: 10/12/2012 PCP: Londell Moh, MD  Assessment/Plan: SEPSIS: Patient did meet overall sepsis criteria though patient is clinically stable.  broad-spectrum antibiotics with vancomycin and Levaquin. Urinalysis has mod LE. Blood and urine cultures pending. Chest x-ray negative for any focal infiltrate. Wound cultures.  trend white count. Some concern for wound source of symptoms. Sacral/lungs overall stable currently. Surgery consultation along with wound care consult ?LTAC vs SNF  Hyponatremia: Hypovolemic clinically. Dry on exam. Will hydrate patient and reassess. Continue to follow closely.   Hypokalemia- replete  Leukocytosis: White blood cell count markedly elevated from recent hospital discharge. Place on broad spectrum antibiotics day #6/7. Pending blood/urine cultures-NGTD no reported diarrhea    Pressure ulcers: Per the caregiver, pressures are somewhat near to baseline, wound care- s/p debridement  Anemia: Baseline hemoglobin nonslipping. At baseline today. Suspect likely anemia of chronic disease. Continue to follow clinically. Transfuse < 7  Multiple sclerosis: Clinically at baseline. Continue home regimen.   Thrombocytosis: Acute or chronic issue. Elevated at last admission. Suspect is likely reactive component to this. Continue clinically follow with hydration.   Code Status: full Family Communication: patient at bedside Disposition Plan: SNF vs LTAC   Consultants:  Surgery  Wound care  Procedures:  debridement  Antibiotics:  vanc/levaquin  HPI/Subjective: No new c/o doing well  Objective: Filed Vitals:   10/16/12 1434 10/16/12 2106 10/16/12 2316 10/17/12 0623  BP: 89/60 95/61  85/54  Pulse: 117 147  111  Temp: 98.3 F (36.8 C) 101 F (38.3 C) 99.3 F (37.4 C) 98.2 F (36.8 C)  TempSrc: Oral Oral  Oral  Resp: 16 16  12   Height:      Weight:      SpO2: 99%  98%  90%    Intake/Output Summary (Last 24 hours) at 10/17/12 1237 Last data filed at 10/17/12 1100  Gross per 24 hour  Intake   1370 ml  Output   1325 ml  Net     45 ml   Filed Weights   10/13/12 0322 10/14/12 1336  Weight: 60.9 kg (134 lb 4.2 oz) 63.2 kg (139 lb 5.3 oz)    Exam:   General:  NAD, laying in bed eating breakfast  Cardiovascular: rrr  Respiratory: clear anterior  Abdomen: +BS, soft, NT/ND  Musculoskeletal: weak LE  Data Reviewed: Basic Metabolic Panel:  Recent Labs Lab 10/12/12 1904 10/13/12 0538 10/14/12 0755 10/17/12 0516  NA 133* 132* 136 133*  K 4.7 3.4* 3.4* 4.1  CL 93* 98 103 99  CO2 24 22 25 27   GLUCOSE 96 108* 102* 118*  BUN 8 7 7 14   CREATININE 0.51 0.44* 0.47* 0.54  CALCIUM 9.4 8.2* 8.2* 8.2*   Liver Function Tests:  Recent Labs Lab 10/12/12 1904 10/13/12 0538  AST 24 13  ALT 9 7  ALKPHOS 85 69  BILITOT 1.0 0.7  PROT 9.4* 7.2  ALBUMIN 2.6* 2.0*   No results found for this basename: LIPASE, AMYLASE,  in the last 168 hours No results found for this basename: AMMONIA,  in the last 168 hours CBC:  Recent Labs Lab 10/12/12 1904 10/13/12 0538 10/14/12 0755 10/15/12 0440 10/17/12 0516  WBC 17.7* 17.9* 10.6* 11.5* 15.6*  NEUTROABS 14.4* 14.6*  --   --   --   HGB 10.8* 7.9* 7.8* 7.3* 7.5*  HCT 33.0* 24.8* 24.5* 24.0* 24.7*  MCV 74.7* 74.3* 75.4* 76.2* 75.8*  PLT 829* 624* 723* 678*  717*   Cardiac Enzymes: No results found for this basename: CKTOTAL, CKMB, CKMBINDEX, TROPONINI,  in the last 168 hours BNP (last 3 results) No results found for this basename: PROBNP,  in the last 8760 hours CBG: No results found for this basename: GLUCAP,  in the last 168 hours  Recent Results (from the past 240 hour(s))  MRSA PCR SCREENING     Status: None   Collection Time    10/13/12  3:12 AM      Result Value Range Status   MRSA by PCR NEGATIVE  NEGATIVE Final   Comment:            The GeneXpert MRSA Assay (FDA     approved  for NASAL specimens     only), is one component of a     comprehensive MRSA colonization     surveillance program. It is not     intended to diagnose MRSA     infection nor to guide or     monitor treatment for     MRSA infections.  WOUND CULTURE     Status: None   Collection Time    10/13/12  5:00 AM      Result Value Range Status   Specimen Description WOUND RT HIP   Final   Special Requests Normal   Final   Gram Stain     Final   Value: ABUNDANT WBC PRESENT, PREDOMINANTLY PMN     NO SQUAMOUS EPITHELIAL CELLS SEEN     MODERATE GRAM POSITIVE COCCI     IN PAIRS   Culture     Final   Value: FEW STAPHYLOCOCCUS AUREUS     Note: RIFAMPIN AND GENTAMICIN SHOULD NOT BE USED AS SINGLE DRUGS FOR TREATMENT OF STAPH INFECTIONS.   Report Status 10/17/2012 FINAL   Final   Organism ID, Bacteria STAPHYLOCOCCUS AUREUS   Final  WOUND CULTURE     Status: None   Collection Time    10/13/12  5:00 AM      Result Value Range Status   Specimen Description WOUND SACRAL   Final   Special Requests NONE   Final   Gram Stain     Final   Value: ABUNDANT WBC PRESENT, PREDOMINANTLY PMN     NO SQUAMOUS EPITHELIAL CELLS SEEN     ABUNDANT GRAM POSITIVE COCCI     IN PAIRS MODERATE GRAM NEGATIVE RODS   Culture     Final   Value: MULTIPLE ORGANISMS PRESENT, NONE PREDOMINANT     Note: NO STAPHYLOCOCCUS AUREUS ISOLATED NO GROUP A STREP (S.PYOGENES) ISOLATED   Report Status 10/15/2012 FINAL   Final  CULTURE, BLOOD (ROUTINE X 2)     Status: None   Collection Time    10/13/12  5:38 AM      Result Value Range Status   Specimen Description BLOOD RIGHT ARM   Final   Special Requests BOTTLES DRAWN AEROBIC AND ANAEROBIC 5CC   Final   Culture  Setup Time 10/13/2012 08:41   Final   Culture     Final   Value:        BLOOD CULTURE RECEIVED NO GROWTH TO DATE CULTURE WILL BE HELD FOR 5 DAYS BEFORE ISSUING A FINAL NEGATIVE REPORT   Report Status PENDING   Incomplete  CULTURE, BLOOD (ROUTINE X 2)     Status: None    Collection Time    10/13/12  5:40 AM      Result Value Range Status  Specimen Description BLOOD RIGHT HAND   Final   Special Requests BOTTLES DRAWN AEROBIC AND ANAEROBIC 5CC   Final   Culture  Setup Time 10/13/2012 08:41   Final   Culture     Final   Value:        BLOOD CULTURE RECEIVED NO GROWTH TO DATE CULTURE WILL BE HELD FOR 5 DAYS BEFORE ISSUING A FINAL NEGATIVE REPORT   Report Status PENDING   Incomplete  SURGICAL PCR SCREEN     Status: None   Collection Time    10/15/12  4:04 AM      Result Value Range Status   MRSA, PCR NEGATIVE  NEGATIVE Final   Staphylococcus aureus NEGATIVE  NEGATIVE Final   Comment:            The Xpert SA Assay (FDA     approved for NASAL specimens     in patients over 5 years of age),     is one component of     a comprehensive surveillance     program.  Test performance has     been validated by [employer] for patients greater     than or equal to 48 year old.     It is not intended     to diagnose infection nor to     guide or monitor treatment.     Studies: No results found.  Scheduled Meds: . amantadine  100 mg Oral BID  . collagenase   Topical Daily  . dantrolene  25 mg Oral TID  . ezetimibe  10 mg Oral Daily  . feeding supplement  237 mL Oral TID BM  . gabapentin  600 mg Oral QID  . heparin  5,000 Units Subcutaneous Q8H  . Interferon Beta-1b  0.3 mg Subcutaneous QODAY  . levofloxacin (LEVAQUIN) IV  750 mg Intravenous Q24H  . modafinil  200 mg Oral BID  . multivitamin with minerals  1 tablet Oral Daily  . nutrition supplement  1 packet Oral BID PC  . tiZANidine  4 mg Oral TID PC & HS   Continuous Infusions: . sodium chloride 75 mL/hr at 10/14/12 0809  . dextrose 5 % and 0.45 % NaCl with KCl 20 mEq/L 75 mL/hr at 10/17/12 1110    Active Problems:   Multiple sclerosis   Wounds, multiple   Hypokalemia   Anemia   Pressure ulcer, buttock(707.05)   Leukocytosis, unspecified    Time spent: 35 min    Chloey Ricard,  Daune Colgate  [facility] Pager [phone]. If 7PM-7AM, please contact night-coverage at [email or web address], password [facility] 10/17/2012, 12:37 PM  LOS: 5 days

## 2012-10-17 NOTE — Progress Notes (Signed)
Calorie Count Note  48 hour calorie count ordered.  Diet: Regular Supplements: Ensure Complete TID, Juven BID  Breakfast 5/31: 792 kcal, 36 g protein Lunch 5/30: No meal ticket Dinner 5/30: 970 kcal, 35 g protein Supplements: 350 kcal, 13 g protein  Total intake: 2112 kcal (100% of minimum estimated needs)  84 protein (92% of minimum estimated needs)  Nutrition Dx: Increased nutrient needs related to underweight and wound healing as evidenced by pt with multiple stage 3 pressure ulcers and BMI of 16.8; ongoing  Goal: Pt to meet >/= 90% of their estimated nutrition needs; met with 2 meals recorded.   Patient likely meeting >100% of estimated kcal and protein needs with 3 meals daily. States he is "cleaning his plate."   Intervention:  Continue Ensure Complete TID  Continue Juven BID  Continue Multivitamin with minerals daily  Recommend Vitamin C and zinc supplements daily  Linnell Fulling, RD, LDN Pager #: [phone] After-Hours Pager #: [phone]

## 2012-10-18 ENCOUNTER — Encounter (HOSPITAL_COMMUNITY): Payer: Self-pay | Admitting: Surgery

## 2012-10-18 MED ORDER — OXYCODONE HCL 5 MG PO TABS
5.0000 mg | ORAL_TABLET | ORAL | Status: DC | PRN
  Administered 2012-10-18 – 2012-10-19 (×3): 10 mg via ORAL
  Filled 2012-10-18 (×3): qty 2

## 2012-10-18 MED ORDER — VITAMINS A & D EX OINT
TOPICAL_OINTMENT | CUTANEOUS | Status: AC
  Administered 2012-10-18: 5
  Filled 2012-10-18: qty 5

## 2012-10-18 NOTE — Progress Notes (Signed)
[facility] PROGRESS NOTE  [name] Mccoy:[id number] DOB: 01-20-1961 DOA: 10/12/2012 PCP: Londell Moh, MD  Assessment/Plan: SEPSIS: Patient did meet overall sepsis criteria though patient is clinically stable.  broad-spectrum antibiotics with vancomycin and Levaquin. Urinalysis has mod LE. Blood and urine cultures pending. Chest x-ray negative for any focal infiltrate. Wound cultures.  trend white count. Some concern for wound source of symptoms. Sacral/lungs overall stable currently. Surgery consultation along with wound care consult SNF  Hyponatremia: Hypovolemic clinically. Dry on exam. Will hydrate patient and reassess. Continue to follow closely.   Hypokalemia- replete  Leukocytosis: White blood cell count markedly elevated from recent hospital discharge. Place on broad spectrum antibiotics day #7/7- changed to just levaquin . Pending blood/urine cultures-NGTD no reported diarrhea    Pressure ulcers: Per the caregiver, pressures are somewhat near to baseline, wound care- s/p debridement  Anemia: Baseline hemoglobin nonslipping. At baseline today. Suspect likely anemia of chronic disease. Continue to follow clinically. Transfuse < 7  Multiple sclerosis: Clinically at baseline. Continue home regimen.   Thrombocytosis: Acute or chronic issue. Elevated at last admission. Suspect is likely reactive component to this. Continue clinically follow with hydration.   Code Status: full Family Communication: patient at bedside Disposition Plan: SNF vs LTAC   Consultants:  Surgery  Wound care  Procedures:  debridement  Antibiotics:  levaquin  HPI/Subjective: Eating well C/o pain at debridement site  Objective: Filed Vitals:   10/17/12 0623 10/17/12 1300 10/17/12 2202 10/18/12 0504  BP: 85/54 109/66 93/53 93/55   Pulse: 111 122 135 110  Temp: 98.2 F (36.8 C) 98.4 F (36.9 C) 99.7 F (37.6 C) 98.3 F (36.8 C)  TempSrc: Oral Axillary Oral Oral  Resp:  12 16 18 18   Height:      Weight:      SpO2: 90% 100% 99% 100%    Intake/Output Summary (Last 24 hours) at 10/18/12 1328 Last data filed at 10/18/12 1100  Gross per 24 hour  Intake 1978.75 ml  Output   2945 ml  Net -966.25 ml   Filed Weights   10/13/12 0322 10/14/12 1336  Weight: 60.9 kg (134 lb 4.2 oz) 63.2 kg (139 lb 5.3 oz)    Exam:   General:  NAD, laying in bed eating breakfast  Cardiovascular: rrr  Respiratory: clear anterior  Abdomen: +BS, soft, NT/ND  Musculoskeletal: weak LE  Data Reviewed: Basic Metabolic Panel:  Recent Labs Lab 10/12/12 1904 10/13/12 0538 10/14/12 0755 10/17/12 0516  NA 133* 132* 136 133*  K 4.7 3.4* 3.4* 4.1  CL 93* 98 103 99  CO2 24 22 25 27   GLUCOSE 96 108* 102* 118*  BUN 8 7 7 14   CREATININE 0.51 0.44* 0.47* 0.54  CALCIUM 9.4 8.2* 8.2* 8.2*   Liver Function Tests:  Recent Labs Lab 10/12/12 1904 10/13/12 0538  AST 24 13  ALT 9 7  ALKPHOS 85 69  BILITOT 1.0 0.7  PROT 9.4* 7.2  ALBUMIN 2.6* 2.0*   No results found for this basename: LIPASE, AMYLASE,  in the last 168 hours No results found for this basename: AMMONIA,  in the last 168 hours CBC:  Recent Labs Lab 10/12/12 1904 10/13/12 0538 10/14/12 0755 10/15/12 0440 10/17/12 0516  WBC 17.7* 17.9* 10.6* 11.5* 15.6*  NEUTROABS 14.4* 14.6*  --   --   --   HGB 10.8* 7.9* 7.8* 7.3* 7.5*  HCT 33.0* 24.8* 24.5* 24.0* 24.7*  MCV 74.7* 74.3* 75.4* 76.2* 75.8*  PLT 829* 624* 723* 678* 717*  Cardiac Enzymes: No results found for this basename: CKTOTAL, CKMB, CKMBINDEX, TROPONINI,  in the last 168 hours BNP (last 3 results) No results found for this basename: PROBNP,  in the last 8760 hours CBG: No results found for this basename: GLUCAP,  in the last 168 hours  Recent Results (from the past 240 hour(s))  MRSA PCR SCREENING     Status: None   Collection Time    10/13/12  3:12 AM      Result Value Range Status   MRSA by PCR NEGATIVE  NEGATIVE Final   Comment:             The GeneXpert MRSA Assay (FDA     approved for NASAL specimens     only), is one component of a     comprehensive MRSA colonization     surveillance program. It is not     intended to diagnose MRSA     infection nor to guide or     monitor treatment for     MRSA infections.  WOUND CULTURE     Status: None   Collection Time    10/13/12  5:00 AM      Result Value Range Status   Specimen Description WOUND RT HIP   Final   Special Requests Normal   Final   Gram Stain     Final   Value: ABUNDANT WBC PRESENT, PREDOMINANTLY PMN     NO SQUAMOUS EPITHELIAL CELLS SEEN     MODERATE GRAM POSITIVE COCCI     IN PAIRS   Culture     Final   Value: FEW STAPHYLOCOCCUS AUREUS     Note: RIFAMPIN AND GENTAMICIN SHOULD NOT BE USED AS SINGLE DRUGS FOR TREATMENT OF STAPH INFECTIONS.   Report Status 10/17/2012 FINAL   Final   Organism ID, Bacteria STAPHYLOCOCCUS AUREUS   Final  WOUND CULTURE     Status: None   Collection Time    10/13/12  5:00 AM      Result Value Range Status   Specimen Description WOUND SACRAL   Final   Special Requests NONE   Final   Gram Stain     Final   Value: ABUNDANT WBC PRESENT, PREDOMINANTLY PMN     NO SQUAMOUS EPITHELIAL CELLS SEEN     ABUNDANT GRAM POSITIVE COCCI     IN PAIRS MODERATE GRAM NEGATIVE RODS   Culture     Final   Value: MULTIPLE ORGANISMS PRESENT, NONE PREDOMINANT     Note: NO STAPHYLOCOCCUS AUREUS ISOLATED NO GROUP A STREP (S.PYOGENES) ISOLATED   Report Status 10/15/2012 FINAL   Final  CULTURE, BLOOD (ROUTINE X 2)     Status: None   Collection Time    10/13/12  5:38 AM      Result Value Range Status   Specimen Description BLOOD RIGHT ARM   Final   Special Requests BOTTLES DRAWN AEROBIC AND ANAEROBIC 5CC   Final   Culture  Setup Time 10/13/2012 08:41   Final   Culture     Final   Value:        BLOOD CULTURE RECEIVED NO GROWTH TO DATE CULTURE WILL BE HELD FOR 5 DAYS BEFORE ISSUING A FINAL NEGATIVE REPORT   Report Status PENDING   Incomplete   CULTURE, BLOOD (ROUTINE X 2)     Status: None   Collection Time    10/13/12  5:40 AM      Result Value Range Status   Specimen Description BLOOD  RIGHT HAND   Final   Special Requests BOTTLES DRAWN AEROBIC AND ANAEROBIC 5CC   Final   Culture  Setup Time 10/13/2012 08:41   Final   Culture     Final   Value:        BLOOD CULTURE RECEIVED NO GROWTH TO DATE CULTURE WILL BE HELD FOR 5 DAYS BEFORE ISSUING A FINAL NEGATIVE REPORT   Report Status PENDING   Incomplete  SURGICAL PCR SCREEN     Status: None   Collection Time    10/15/12  4:04 AM      Result Value Range Status   MRSA, PCR NEGATIVE  NEGATIVE Final   Staphylococcus aureus NEGATIVE  NEGATIVE Final   Comment:            The Xpert SA Assay (FDA     approved for NASAL specimens     in patients over 48 years of age),     is one component of     a comprehensive surveillance     program.  Test performance has     been validated by [employer] for patients greater     than or equal to 32 year old.     It is not intended     to diagnose infection nor to     guide or monitor treatment.     Studies: No results found.  Scheduled Meds: . amantadine  100 mg Oral BID  . collagenase   Topical Daily  . dantrolene  25 mg Oral TID  . ezetimibe  10 mg Oral Daily  . feeding supplement  237 mL Oral TID BM  . gabapentin  600 mg Oral QID  . heparin  5,000 Units Subcutaneous Q8H  . Interferon Beta-1b  0.3 mg Subcutaneous QODAY  . levofloxacin (LEVAQUIN) IV  750 mg Intravenous Q24H  . modafinil  200 mg Oral BID  . multivitamin with minerals  1 tablet Oral Daily  . nutrition supplement  1 packet Oral BID PC  . tiZANidine  4 mg Oral TID PC & HS   Continuous Infusions: . sodium chloride 75 mL/hr at 10/14/12 0809  . dextrose 5 % and 0.45 % NaCl with KCl 20 mEq/L 75 mL/hr at 10/18/12 0605    Active Problems:   Multiple sclerosis   Wounds, multiple   Hypokalemia   Anemia   Pressure ulcer, buttock(707.05)   Leukocytosis,  unspecified    Time spent: 35 min    [name] Mccoy  [facility] Pager [phone]. If 7PM-7AM, please contact night-coverage at [email or web address], password [facility] 10/18/2012, 1:28 PM  LOS: 6 days

## 2012-10-18 NOTE — Progress Notes (Signed)
PT Cancellation Note and d/c from hydrotherapy  Patient Details Name: [name] Mccoy MRN: [id number] DOB: 02-20-1961   Cancelled Treatment:    Reason Eval/Treat Not Completed: Other (comment) RN reports surgery states hydrotherapy no longer necessary.  Please re-order if necessary. Thanks.   Hollan Philipp,KATHrine E 10/18/2012, 3:24 PM Pager: [phone]

## 2012-10-19 LAB — CULTURE, BLOOD (ROUTINE X 2): Culture: NO GROWTH

## 2012-10-19 MED ORDER — HYDROMORPHONE HCL PF 1 MG/ML IJ SOLN
0.5000 mg | INTRAMUSCULAR | Status: DC | PRN
  Administered 2012-10-19 – 2012-10-22 (×10): 0.5 mg via INTRAVENOUS
  Filled 2012-10-19 (×10): qty 1

## 2012-10-19 MED ORDER — OXYCODONE HCL 5 MG PO TABS
10.0000 mg | ORAL_TABLET | ORAL | Status: DC | PRN
  Administered 2012-10-19: 10 mg via ORAL
  Administered 2012-10-20 – 2012-10-22 (×7): 15 mg via ORAL
  Filled 2012-10-19 (×6): qty 3
  Filled 2012-10-19: qty 2
  Filled 2012-10-19: qty 3

## 2012-10-19 NOTE — Progress Notes (Signed)
[facility] PROGRESS NOTE  [name] Mccoy ZOX:[id number] DOB: [birth year] DOA: 10/12/2012 PCP: Londell Moh, MD  Patient presented to the ER today with a temperature of 101.9 as well as white blood cell count of 17 and a heart rate into the 150s.  Patient is noted to have been admitted for similar symptoms about 3-4 weeks ago. Patient was formally diagnosed with a urinary tract infection Patient was seen by wound care and then had a debridement on 5/30   Assessment/Plan: SEPSIS: Patient did meet overall sepsis criteria though patient is clinically stable.  broad-spectrum antibiotics inially with vancomycin and Levaquin- changes to just levaquin on 6/2 as just MSSA Urinalysis has mod LE.   Chest x-ray negative for any focal infiltrate. concern for wound source of symptoms. Surgery consultation along with wound care consult: s/p debridement SNF placement  Hyponatremia: Hypovolemic clinically. Dry on exam. Will hydrate patient and reassess. Continue to follow closely.   Hypokalemia- replete  Leukocytosis: White blood cell count markedly elevated from recent hospital discharge. Place on broad spectrum antibiotics for 14 days  Pending blood/urine cultures-NGTD Wound culture MSSA  no reported diarrhea    Pressure ulcers:  s/p debridement  Anemia: Baseline hemoglobin Suspect likely anemia of chronic disease. Continue to follow clinically. Transfuse < 7  Multiple sclerosis: Clinically at baseline. Continue home regimen.   Thrombocytosis: Acute or chronic issue. Elevated at last admission. Suspect is likely reactive component to this.  monitor  Pain - still requiring IV- change to PO and increase  Code Status: full Family Communication: patient at bedside Disposition Plan: SNF    Consultants:  Surgery  Wound care  Procedures:  debridement  Antibiotics:  levaquin  HPI/Subjective: Still requiring IV pain meds  Objective: Filed Vitals:   10/18/12 0504  10/18/12 1300 10/18/12 2057 10/19/12 0452  BP: 93/55 106/62 108/62 90/58  Pulse: 110 108 137 114  Temp: 98.3 F (36.8 C) 97.4 F (36.3 C) 98.1 F (36.7 C) 98.8 F (37.1 C)  TempSrc: Oral Oral Oral Oral  Resp: 18 18 18 18   Height:      Weight:      SpO2: 100% 99% 97% 98%    Intake/Output Summary (Last 24 hours) at 10/19/12 1325 Last data filed at 10/19/12 1250  Gross per 24 hour  Intake    930 ml  Output   2900 ml  Net  -1970 ml   Filed Weights   10/13/12 0322 10/14/12 1336  Weight: 60.9 kg (134 lb 4.2 oz) 63.2 kg (139 lb 5.3 oz)    Exam:   General:  NAD, laying in bed eating breakfast  Cardiovascular: rrr  Respiratory: clear anterior  Abdomen: +BS, soft, NT/ND  Musculoskeletal: weak LE  Data Reviewed: Basic Metabolic Panel:  Recent Labs Lab 10/12/12 1904 10/13/12 0538 10/14/12 0755 10/17/12 0516  NA 133* 132* 136 133*  K 4.7 3.4* 3.4* 4.1  CL 93* 98 103 99  CO2 24 22 25 27   GLUCOSE 96 108* 102* 118*  BUN 8 7 7 14   CREATININE 0.51 0.44* 0.47* 0.54  CALCIUM 9.4 8.2* 8.2* 8.2*   Liver Function Tests:  Recent Labs Lab 10/12/12 1904 10/13/12 0538  AST 24 13  ALT 9 7  ALKPHOS 85 69  BILITOT 1.0 0.7  PROT 9.4* 7.2  ALBUMIN 2.6* 2.0*   No results found for this basename: LIPASE, AMYLASE,  in the last 168 hours No results found for this basename: AMMONIA,  in the last 168 hours CBC:  Recent  Labs Lab 10/12/12 1904 10/13/12 0538 10/14/12 0755 10/15/12 0440 10/17/12 0516  WBC 17.7* 17.9* 10.6* 11.5* 15.6*  NEUTROABS 14.4* 14.6*  --   --   --   HGB 10.8* 7.9* 7.8* 7.3* 7.5*  HCT 33.0* 24.8* 24.5* 24.0* 24.7*  MCV 74.7* 74.3* 75.4* 76.2* 75.8*  PLT 829* 624* 723* 678* 717*   Cardiac Enzymes: No results found for this basename: CKTOTAL, CKMB, CKMBINDEX, TROPONINI,  in the last 168 hours BNP (last 3 results) No results found for this basename: PROBNP,  in the last 8760 hours CBG: No results found for this basename: GLUCAP,  in the last 168  hours  Recent Results (from the past 240 hour(s))  MRSA PCR SCREENING     Status: None   Collection Time    10/13/12  3:12 AM      Result Value Range Status   MRSA by PCR NEGATIVE  NEGATIVE Final   Comment:            The GeneXpert MRSA Assay (FDA     approved for NASAL specimens     only), is one component of a     comprehensive MRSA colonization     surveillance program. It is not     intended to diagnose MRSA     infection nor to guide or     monitor treatment for     MRSA infections.  WOUND CULTURE     Status: None   Collection Time    10/13/12  5:00 AM      Result Value Range Status   Specimen Description WOUND RT HIP   Final   Special Requests Normal   Final   Gram Stain     Final   Value: ABUNDANT WBC PRESENT, PREDOMINANTLY PMN     NO SQUAMOUS EPITHELIAL CELLS SEEN     MODERATE GRAM POSITIVE COCCI     IN PAIRS   Culture     Final   Value: FEW STAPHYLOCOCCUS AUREUS     Note: RIFAMPIN AND GENTAMICIN SHOULD NOT BE USED AS SINGLE DRUGS FOR TREATMENT OF STAPH INFECTIONS.   Report Status 10/17/2012 FINAL   Final   Organism ID, Bacteria STAPHYLOCOCCUS AUREUS   Final  WOUND CULTURE     Status: None   Collection Time    10/13/12  5:00 AM      Result Value Range Status   Specimen Description WOUND SACRAL   Final   Special Requests NONE   Final   Gram Stain     Final   Value: ABUNDANT WBC PRESENT, PREDOMINANTLY PMN     NO SQUAMOUS EPITHELIAL CELLS SEEN     ABUNDANT GRAM POSITIVE COCCI     IN PAIRS MODERATE GRAM NEGATIVE RODS   Culture     Final   Value: MULTIPLE ORGANISMS PRESENT, NONE PREDOMINANT     Note: NO STAPHYLOCOCCUS AUREUS ISOLATED NO GROUP A STREP (S.PYOGENES) ISOLATED   Report Status 10/15/2012 FINAL   Final  CULTURE, BLOOD (ROUTINE X 2)     Status: None   Collection Time    10/13/12  5:38 AM      Result Value Range Status   Specimen Description BLOOD RIGHT ARM   Final   Special Requests BOTTLES DRAWN AEROBIC AND ANAEROBIC 5CC   Final   Culture  Setup  Time 10/13/2012 08:41   Final   Culture NO GROWTH 5 DAYS   Final   Report Status 10/19/2012 FINAL   Final  CULTURE,  BLOOD (ROUTINE X 2)     Status: None   Collection Time    10/13/12  5:40 AM      Result Value Range Status   Specimen Description BLOOD RIGHT HAND   Final   Special Requests BOTTLES DRAWN AEROBIC AND ANAEROBIC 5CC   Final   Culture  Setup Time 10/13/2012 08:41   Final   Culture NO GROWTH 5 DAYS   Final   Report Status 10/19/2012 FINAL   Final  SURGICAL PCR SCREEN     Status: None   Collection Time    10/15/12  4:04 AM      Result Value Range Status   MRSA, PCR NEGATIVE  NEGATIVE Final   Staphylococcus aureus NEGATIVE  NEGATIVE Final   Comment:            The Xpert SA Assay (FDA     approved for NASAL specimens     in patients over 30 years of age),     is one component of     a comprehensive surveillance     program.  Test performance has     been validated by [employer] for patients greater     than or equal to 90 year old.     It is not intended     to diagnose infection nor to     guide or monitor treatment.     Studies: No results found.  Scheduled Meds: . amantadine  100 mg Oral BID  . collagenase   Topical Daily  . dantrolene  25 mg Oral TID  . ezetimibe  10 mg Oral Daily  . feeding supplement  237 mL Oral TID BM  . gabapentin  600 mg Oral QID  . heparin  5,000 Units Subcutaneous Q8H  . Interferon Beta-1b  0.3 mg Subcutaneous QODAY  . levofloxacin (LEVAQUIN) IV  750 mg Intravenous Q24H  . modafinil  200 mg Oral BID  . multivitamin with minerals  1 tablet Oral Daily  . nutrition supplement  1 packet Oral BID PC  . tiZANidine  4 mg Oral TID PC & HS   Continuous Infusions:    Active Problems:   Multiple sclerosis   Wounds, multiple   Hypokalemia   Anemia   Pressure ulcer, buttock(707.05)   Leukocytosis, unspecified    Time spent: 35 min    Darreld Hoffer  [facility] Pager [phone]. If 7PM-7AM, please contact  night-coverage at [email or web address], password [facility] 10/19/2012, 1:25 PM  LOS: 7 days

## 2012-10-19 NOTE — Consult Note (Signed)
WOC consult Note: Recommendations for d/c care This patient requires aggressive wound care for future surgical procedure as indicated by consultation with Plastic Surgeon, Dr. Kelly Splinter. Appreciated are the Case Management department's notes pertaining to placement. Until follow up visit with Outpatient Wound Center as indicated in Dr. Leonie Green note, and in the absence of placement in a post-acute setting such as an LTAC or a SNF, I can only recommend twice daily saline dressings at this time.  More comprehensive, active wound care is indicated when used in conjunction with an aggressive, monitored nutritional supplementation program.  I will not follow.  Please re-consult if needed. Thanks, Ladona Mow, MSN, RN, [facility], CWOCN [phone])

## 2012-10-20 MED ORDER — SODIUM CHLORIDE 0.9 % IV BOLUS (SEPSIS)
500.0000 mL | Freq: Once | INTRAVENOUS | Status: AC
  Administered 2012-10-20: 500 mL via INTRAVENOUS

## 2012-10-20 MED ORDER — BISACODYL 10 MG RE SUPP
10.0000 mg | Freq: Once | RECTAL | Status: AC
  Administered 2012-10-20: 10 mg via RECTAL
  Filled 2012-10-20: qty 1

## 2012-10-20 MED ORDER — VITAMINS A & D EX OINT
TOPICAL_OINTMENT | CUTANEOUS | Status: AC
  Administered 2012-10-20: 18:00:00
  Filled 2012-10-20: qty 5

## 2012-10-20 NOTE — Progress Notes (Signed)
[facility] made bed offer and is beginning [place] process. Patient made aware that this is the only bed offer available. Pending auth.  Valiant Dills C. Dannelle Rhymes MSW, LCSW [phone]

## 2012-10-20 NOTE — Progress Notes (Addendum)
[facility] PROGRESS NOTE  [name] Mccoy RUE:[id number] DOB: Dec 28, 1960 DOA: 10/12/2012 PCP: Londell Moh, MD  Assessment/Plan: Active Problems:   Multiple sclerosis   Wounds, multiple   Hypokalemia   Anemia   Pressure ulcer, buttock(707.05)   Leukocytosis, unspecified    1. Sepsis: Patient presented with tachycardia, a pyrexia of 101.9, as well as a wcc 17.7. He did meet overall sepsis criteria, although clinically stable. Septic work up was carried out, and patient managed with iv Vancomycin/Levaquin. Urinalysis revealed pyuria, blood and urine cultures remained negative. Chest x-ray negative for any focal infiltrate. Wound cultures revealed gram positive cocci, later identified as MSSA, as well as gram negative rods. Patient's decubiti appears to be the source of infection. WOC was consulted, Dr Darnell Level provided surgery consultation, and patient is s/p sharp debridement in OR on 10/15/12. Vancomycin was discontinued on 10/19/12. Per WOC, patient will benefit from specialist wound care, following discharge.  2. Electrolyte abnormalities (Hyponatremia/Hypokalemia): Serum sodium was 133 at presentation, and patient appeared hypovolemic clinically. Managed with iv NS, with achievement of euvolemia and resolution of hyponatremia. Hypokalemia was repleted as indicated.   3. Pressure ulcers: Patient had chronic, non-healing pressure ulcers at presentation, Sacral: 6cm x 5.5cm x .4cm with undermining from 6-10 o'clock measuring 4cm. Small necrotic area (soft yellow/black eschar) at 6 o'clock measuring .5cm x 3cm. Right hip: 7cm x 5cm x .4cm with undermining from 3-8 o'clock measuring 3.5cm. Wound margin from 8-3 o'clock with epibole (rolled, closed wound edges).  See discussion in #1 above. Will need continued wound care by specialist, on discharge.  4. Anemia/Thrombocytosis: Chronic microcytic anemia, likely secondary to chronic disease. Gradually worsening. As HB is 7.5 today, will  transfuse 1 unit PRBC. Thrombocytosis appears reactive.  5. Multiple sclerosis: Clinically at baseline. Has bilateral knee flexion contractures. Continue home regimen.    Code Status: Full Code.  Family Communication:  Disposition Plan: To be determined.    Brief narrative: 52 y.o. year old male with multiple sclerosis, lower extremity contractures, sacral decubitus and hip pressure ulcers, anemia, thrombocytosis, presenting with dehydration, questionable sepsis. Patient currently lives at home with a in-house caregiver. Per report, physical therapy came by to see the patient while the caregiver was not there, was concerned about the patient being dehydrated, as he had elevated heart rate. Per the caregiver, his home health nurse was made aware of the situation and was he was recommended to call EMS because of the elevated heart rate. Patient states that he has felt overall asymptomatic over the past 24 hours. Patient currently denies any dysuria, nausea, abdominal pain, headache. Per care giver, patient has had some issues with dark urine is also questionable bloody urine consistent with prior UTIs. 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 Wound dressings have been getting changeed and there have been no gross signs of recurrence or infection.  In the Ed, he had a temperature of 101.9 as well as white blood cell count of 17 and a heart rate into the 150s. Admitted for further management.    Consultants:  N/A.   Procedures:  See Notes.   Antibiotics:  Levaquin 10/12/12>>>  Vancomycin 10/12/12-10/16/12.   HPI/Subjective: Feels better.   Objective: Vital signs in last 24 hours: Temp:  [98.2 F (36.8 C)-99.1 F (37.3 C)] 98.5 F (36.9 C) (06/04 0706) Pulse Rate:  [121-134] 126 (06/04 0706) Resp:  [18-20] 18 (06/04 0706) BP: (81-91)/(56-57) 81/56 mmHg (06/04 0706)  SpO2:  [100 %] 100 % (06/04 0706) Weight change:  Last  BM Date: 10/17/12  Intake/Output from previous day: 06/03 0701 - 06/04 0700 In: 870 [P.O.:720; IV Piggyback:150] Out: 2550 [Urine:2550]     Physical Exam: General: Comfortable, alert, communicative, fully oriented, not short of breath at rest.  HEENT:  Moderate clinical pallor, no jaundice, no conjunctival injection or discharge. NECK:  Supple, JVP not seen, no carotid bruits, no palpable lymphadenopathy, no palpable goiter. CHEST:  Clinically clear to auscultation, no wheezes, no crackles. HEART:  Sounds 1 and 2 heard, normal, regular, no murmurs. ABDOMEN:  Non-tender, no palpable organomegaly, no palpable masses, normal bowel sounds. GENITALIA:  Not examined.  LOWER EXTREMITIES:  No pitting edema, palpable peripheral pulses. MUSCULOSKELETAL SYSTEM:  Bilateral flexion contractures, in the knees.  CENTRAL NERVOUS SYSTEM:  No focal neurologic deficit on gross examination.  Lab Results: No results found for this basename: WBC, HGB, HCT, PLT,  in the last 72 hours No results found for this basename: NA, K, CL, CO2, GLUCOSE, BUN, CREATININE, CALCIUM,  in the last 72 hours Recent Results (from the past 240 hour(s))  MRSA PCR SCREENING     Status: None   Collection Time    10/13/12  3:12 AM      Result Value Range Status   MRSA by PCR NEGATIVE  NEGATIVE Final   Comment:            The GeneXpert MRSA Assay (FDA     approved for NASAL specimens     only), is one component of a     comprehensive MRSA colonization     surveillance program. It is not     intended to diagnose MRSA     infection nor to guide or     monitor treatment for     MRSA infections.  WOUND CULTURE     Status: None   Collection Time    10/13/12  5:00 AM      Result Value Range Status   Specimen Description WOUND RT HIP   Final   Special Requests Normal   Final   Gram Stain     Final   Value: ABUNDANT WBC PRESENT, PREDOMINANTLY PMN     NO SQUAMOUS EPITHELIAL CELLS SEEN     MODERATE GRAM POSITIVE COCCI      IN PAIRS   Culture     Final   Value: FEW STAPHYLOCOCCUS AUREUS     Note: RIFAMPIN AND GENTAMICIN SHOULD NOT BE USED AS SINGLE DRUGS FOR TREATMENT OF STAPH INFECTIONS.   Report Status 10/17/2012 FINAL   Final   Organism ID, Bacteria STAPHYLOCOCCUS AUREUS   Final  WOUND CULTURE     Status: None   Collection Time    10/13/12  5:00 AM      Result Value Range Status   Specimen Description WOUND SACRAL   Final   Special Requests NONE   Final   Gram Stain     Final   Value: ABUNDANT WBC PRESENT, PREDOMINANTLY PMN     NO SQUAMOUS EPITHELIAL CELLS SEEN     ABUNDANT GRAM POSITIVE COCCI     IN PAIRS MODERATE GRAM NEGATIVE RODS   Culture     Final   Value: MULTIPLE ORGANISMS PRESENT, NONE PREDOMINANT     Note: NO STAPHYLOCOCCUS AUREUS ISOLATED NO GROUP A STREP (S.PYOGENES) ISOLATED   Report Status 10/15/2012 FINAL   Final  CULTURE, BLOOD (ROUTINE X 2)     Status: None  Collection Time    10/13/12  5:38 AM      Result Value Range Status   Specimen Description BLOOD RIGHT ARM   Final   Special Requests BOTTLES DRAWN AEROBIC AND ANAEROBIC 5CC   Final   Culture  Setup Time 10/13/2012 08:41   Final   Culture NO GROWTH 5 DAYS   Final   Report Status 10/19/2012 FINAL   Final  CULTURE, BLOOD (ROUTINE X 2)     Status: None   Collection Time    10/13/12  5:40 AM      Result Value Range Status   Specimen Description BLOOD RIGHT HAND   Final   Special Requests BOTTLES DRAWN AEROBIC AND ANAEROBIC 5CC   Final   Culture  Setup Time 10/13/2012 08:41   Final   Culture NO GROWTH 5 DAYS   Final   Report Status 10/19/2012 FINAL   Final  SURGICAL PCR SCREEN     Status: None   Collection Time    10/15/12  4:04 AM      Result Value Range Status   MRSA, PCR NEGATIVE  NEGATIVE Final   Staphylococcus aureus NEGATIVE  NEGATIVE Final   Comment:            The Xpert SA Assay (FDA     approved for NASAL specimens     in patients over 59 years of age),     is one component of     a comprehensive  surveillance     program.  Test performance has     been validated by [employer] for patients greater     than or equal to 36 year old.     It is not intended     to diagnose infection nor to     guide or monitor treatment.     Studies/Results: No results found.  Medications: Scheduled Meds: . amantadine  100 mg Oral BID  . collagenase   Topical Daily  . dantrolene  25 mg Oral TID  . ezetimibe  10 mg Oral Daily  . feeding supplement  237 mL Oral TID BM  . gabapentin  600 mg Oral QID  . heparin  5,000 Units Subcutaneous Q8H  . Interferon Beta-1b  0.3 mg Subcutaneous QODAY  . levofloxacin (LEVAQUIN) IV  750 mg Intravenous Q24H  . modafinil  200 mg Oral BID  . multivitamin with minerals  1 tablet Oral Daily  . nutrition supplement  1 packet Oral BID PC  . tiZANidine  4 mg Oral TID PC & HS   Continuous Infusions:  PRN Meds:.acetaminophen, HYDROmorphone (DILAUDID) injection, ondansetron (ZOFRAN) IV, ondansetron, oxyCODONE, polyethylene glycol    LOS: 8 days   Shyheem Whitham,CHRISTOPHER  [facility] Pager [phone]. If 8PM-8AM, please contact night-coverage at [email or web address], password [facility] 10/20/2012, 11:46 AM  LOS: 8 days

## 2012-10-21 DIAGNOSIS — G35 Multiple sclerosis: Secondary | ICD-10-CM

## 2012-10-21 LAB — CBC
MCV: 75 fL — ABNORMAL LOW (ref 78.0–100.0)
Platelets: 763 10*3/uL — ABNORMAL HIGH (ref 150–400)
RBC: 3.68 MIL/uL — ABNORMAL LOW (ref 4.22–5.81)
WBC: 16.1 10*3/uL — ABNORMAL HIGH (ref 4.0–10.5)

## 2012-10-21 LAB — TYPE AND SCREEN
ABO/RH(D): A POS
Antibody Screen: NEGATIVE

## 2012-10-21 LAB — BASIC METABOLIC PANEL
CO2: 26 mEq/L (ref 19–32)
Chloride: 96 mEq/L (ref 96–112)
Creatinine, Ser: 0.53 mg/dL (ref 0.50–1.35)
Sodium: 131 mEq/L — ABNORMAL LOW (ref 135–145)

## 2012-10-21 MED ORDER — VITAMINS A & D EX OINT
TOPICAL_OINTMENT | CUTANEOUS | Status: AC
  Administered 2012-10-21: 18:00:00
  Filled 2012-10-21: qty 5

## 2012-10-21 NOTE — Discharge Summary (Signed)
Physician Discharge Summary  [name] Mccoy OZH:[id number] DOB: July 10, 1960 DOA: 10/12/2012  PCP: Londell Moh, MD  Admit date: 10/12/2012 Discharge date: 10/22/2012  Time spent: 40 minutes  Recommendations for Outpatient Follow-up:  1. Follow up with PMD. 2. Follow up with Wound clinic.   Discharge Diagnoses:  Active Problems:   Multiple sclerosis   Wounds, multiple   Hypokalemia   Anemia   Pressure ulcer, buttock(707.05)   Leukocytosis, unspecified   Discharge Condition: Satisfactory.   Diet recommendation: Regular.   Filed Weights   10/13/12 0322 10/14/12 1336  Weight: 60.9 kg (134 lb 4.2 oz) 63.2 kg (139 lb 5.3 oz)    History of present illness:  52 y.o. year old male with multiple sclerosis, lower extremity contractures, sacral decubitus and hip pressure ulcers, anemia, thrombocytosis, presenting with dehydration, questionable sepsis. Patient currently lives at home with a in-house caregiver. Per report, physical therapy came by to see the patient while the caregiver was not there, was concerned about the patient being dehydrated, as he had elevated heart rate. Per the caregiver, his home health nurse was made aware of the situation and was he was recommended to call EMS because of the elevated heart rate. Patient states that he has felt overall asymptomatic over the past 24 hours. Patient currently denies any dysuria, nausea, abdominal pain, headache. Per care giver, patient has had some issues with dark urine is also questionable bloody urine consistent with prior UTIs. 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 Wound dressings have been getting changeed and there have been no gross signs of recurrence or infection.  In the Ed, he had a temperature of 101.9 as well as white blood cell count of 17 and a heart rate into the 150s. Admitted for further management.    Hospital Course:  1. Sepsis: Patient  presented with tachycardia, a pyrexia of 101.9, as well as a wcc 17.7. He did meet overall sepsis criteria, although clinically stable. Septic work up was carried out, and patient managed with iv Vancomycin/Levaquin. Urinalysis revealed pyuria, blood and urine cultures remained negative. Chest X-Ray was negative for any focal infiltrate. Wound cultures revealed gram positive cocci, later identified as MSSA, as well as gram negative rods. Patient's decubiti appears to be the source of infection. WOC was consulted, Dr Darnell Level provided surgery consultation, and patient is s/p sharp debridement in OR on 10/15/12. Vancomycin was discontinued on 10/19/12, and Levaquin will be concluded on 10/28/10/14. Per WOC, patient will benefit from specialist wound care, following discharge, and will be followed up at the wound care clinic.  2. Electrolyte abnormalities (Hyponatremia/Hypokalemia): Serum sodium was 133 at presentation, and patient appeared hypovolemic clinically. Managed with iv NS, with achievement of euvolemia and resolution of hyponatremia. Hypokalemia was repleted as indicated.  3. Pressure ulcers: Patient had chronic, non-healing pressure ulcers at presentation, Sacral: 6cm x 5.5cm x .4cm with undermining from 6-10 o'clock measuring 4cm. Small necrotic area (soft yellow/black eschar) at 6 o'clock measuring .5cm x 3cm. Right hip: 7cm x 5cm x .4cm with undermining from 3-8 o'clock measuring 3.5cm. Wound margin from 8-3 o'clock with epibole (rolled, closed wound edges).  See discussion in #1 above. As described above, will need continued wound care by specialist, on discharge.  4. Anemia/Thrombocytosis: Chronic microcytic anemia, likely secondary to chronic disease. Gradually worsening. As HB was 7.5 on 10/20/12, patient was transfused 1 unit PRBC on that date, with satisfactory bump in HB to  8.8. Thrombocytosis appears reactive.  5. Multiple sclerosis: Clinically at baseline. Has bilateral knee flexion  contractures. Continued on pre-admission regimen.    Procedures:  See Below.  Wound debridement on 10/19/12.   Consultations:  Dr Darnell Level, surgeon.   Discharge Exam: Filed Vitals:   10/21/12 2214 10/21/12 2300 10/22/12 0652 10/22/12 1415  BP: 98/63  88/57 99/72  Pulse: 124  118 130  Temp: 100.4 F (38 C) 99.9 F (37.7 C) 99.5 F (37.5 C) 98.7 F (37.1 C)  TempSrc: Oral  Oral Oral  Resp: 16  18 20   Height:      Weight:      SpO2: 99%  96% 90%    General: Comfortable, alert, communicative, fully oriented, not short of breath at rest.  HEENT: Mild clinical pallor, no jaundice, no conjunctival injection or discharge.  NECK: Supple, JVP not seen, no carotid bruits, no palpable lymphadenopathy, no palpable goiter.  CHEST: Clinically clear to auscultation, no wheezes, no crackles.  HEART: Sounds 1 and 2 heard, normal, regular, no murmurs.  ABDOMEN: Non-tender, no palpable organomegaly, no palpable masses, normal bowel sounds.  GENITALIA: Not examined.  LOWER EXTREMITIES: No pitting edema, palpable peripheral pulses.  MUSCULOSKELETAL SYSTEM: Bilateral flexion contractures, in the knees.  CENTRAL NERVOUS SYSTEM: No focal neurologic deficit on gross examination.  Discharge Instructions      Discharge Orders   Future Appointments Provider Department Dept Phone   11/04/2012 2:45 PM Kerry Fort, PT Outpt [facility] [phone]   Future Orders Complete By Expires     Diet general  As directed     Increase activity slowly  As directed         Medication List    TAKE these medications       amantadine 100 MG capsule  Commonly known as:  SYMMETREL  Take 100 mg by mouth 2 (two) times daily.     collagenase ointment  Commonly known as:  SANTYL  Apply topically daily. Apply to Unstageable pressure ulcer on coccyx once daily.  Apply 1/8 inch thick and top with saline dressing.  Change daily and PRN for soiling or loosening of  dressing x 21 days.     dantrolene 25 MG capsule  Commonly known as:  DANTRIUM  Take 25 mg by mouth 3 (three) times daily.     ezetimibe 10 MG tablet  Commonly known as:  ZETIA  Take 10 mg by mouth daily.     feeding supplement Liqd  Take 237 mLs by mouth 3 (three) times daily between meals.     nutrition supplement Pack  Take 1 packet by mouth 2 (two) times daily after a meal.     gabapentin 600 MG tablet  Commonly known as:  NEURONTIN  Take 600 mg by mouth 4 (four) times daily.     HYDROcodone-acetaminophen 5-325 MG per tablet  Commonly known as:  NORCO/VICODIN  Take 1-2 tablets by mouth every 8 (eight) hours as needed for pain.     interferon beta-1b 0.3 MG injection  Commonly known as:  BETASERON  Inject 0.3 mg into the skin every other day.     levofloxacin 750 MG tablet  Commonly known as:  LEVAQUIN  Take 1 tablet (750 mg total) by mouth daily.     modafinil 200 MG tablet  Commonly known as:  PROVIGIL  Take 200 mg by mouth 2 (two) times daily.     multivitamin with minerals Tabs  Take 1 tablet by mouth daily.  tiZANidine 4 MG tablet  Commonly known as:  ZANAFLEX  Take 4 mg by mouth 4 (four) times daily - after meals and at bedtime.       No Known Allergies Follow-up Information   Follow up with Londell Moh, MD.   Contact information:   [address] [place] [address] [phone]        The results of significant diagnostics from this hospitalization (including imaging, microbiology, ancillary and laboratory) are listed below for reference.    Significant Diagnostic Studies: Dg Chest Port 1 View  10/12/2012   *RADIOLOGY REPORT*  Clinical Data: Rapid heart rate.  PORTABLE CHEST - 1 VIEW  Comparison: Fourth 32,014  Findings: Patient in  atypical positioning due to hip pain.  Stable cardiac silhouette.  Lungs are hyperinflated.  There is linear scarring at the right lung apex not changed from prior.  No focal  consolidation.  No pneumothorax.  IMPRESSION: 1.  No interval change. 2.  Hyperinflated lungs with pulmonary scarring at the right lung apex.   Original Report Authenticated By: Genevive Bi, M.D.    Microbiology: Recent Results (from the past 240 hour(s))  MRSA PCR SCREENING     Status: None   Collection Time    10/13/12  3:12 AM      Result Value Range Status   MRSA by PCR NEGATIVE  NEGATIVE Final   Comment:            The GeneXpert MRSA Assay (FDA     approved for NASAL specimens     only), is one component of a     comprehensive MRSA colonization     surveillance program. It is not     intended to diagnose MRSA     infection nor to guide or     monitor treatment for     MRSA infections.  WOUND CULTURE     Status: None   Collection Time    10/13/12  5:00 AM      Result Value Range Status   Specimen Description WOUND RT HIP   Final   Special Requests Normal   Final   Gram Stain     Final   Value: ABUNDANT WBC PRESENT, PREDOMINANTLY PMN     NO SQUAMOUS EPITHELIAL CELLS SEEN     MODERATE GRAM POSITIVE COCCI     IN PAIRS   Culture     Final   Value: FEW STAPHYLOCOCCUS AUREUS     Note: RIFAMPIN AND GENTAMICIN SHOULD NOT BE USED AS SINGLE DRUGS FOR TREATMENT OF STAPH INFECTIONS.   Report Status 10/17/2012 FINAL   Final   Organism ID, Bacteria STAPHYLOCOCCUS AUREUS   Final  WOUND CULTURE     Status: None   Collection Time    10/13/12  5:00 AM      Result Value Range Status   Specimen Description WOUND SACRAL   Final   Special Requests NONE   Final   Gram Stain     Final   Value: ABUNDANT WBC PRESENT, PREDOMINANTLY PMN     NO SQUAMOUS EPITHELIAL CELLS SEEN     ABUNDANT GRAM POSITIVE COCCI     IN PAIRS MODERATE GRAM NEGATIVE RODS   Culture     Final   Value: MULTIPLE ORGANISMS PRESENT, NONE PREDOMINANT     Note: NO STAPHYLOCOCCUS AUREUS ISOLATED NO GROUP A STREP (S.PYOGENES) ISOLATED   Report Status 10/15/2012 FINAL   Final  CULTURE, BLOOD (ROUTINE X 2)  Status:  None   Collection Time    10/13/12  5:38 AM      Result Value Range Status   Specimen Description BLOOD RIGHT ARM   Final   Special Requests BOTTLES DRAWN AEROBIC AND ANAEROBIC 5CC   Final   Culture  Setup Time 10/13/2012 08:41   Final   Culture NO GROWTH 5 DAYS   Final   Report Status 10/19/2012 FINAL   Final  CULTURE, BLOOD (ROUTINE X 2)     Status: None   Collection Time    10/13/12  5:40 AM      Result Value Range Status   Specimen Description BLOOD RIGHT HAND   Final   Special Requests BOTTLES DRAWN AEROBIC AND ANAEROBIC 5CC   Final   Culture  Setup Time 10/13/2012 08:41   Final   Culture NO GROWTH 5 DAYS   Final   Report Status 10/19/2012 FINAL   Final  SURGICAL PCR SCREEN     Status: None   Collection Time    10/15/12  4:04 AM      Result Value Range Status   MRSA, PCR NEGATIVE  NEGATIVE Final   Staphylococcus aureus NEGATIVE  NEGATIVE Final   Comment:            The Xpert SA Assay (FDA     approved for NASAL specimens     in patients over 59 years of age),     is one component of     a comprehensive surveillance     program.  Test performance has     been validated by [employer] for patients greater     than or equal to 10 year old.     It is not intended     to diagnose infection nor to     guide or monitor treatment.     Labs: Basic Metabolic Panel:  Recent Labs Lab 10/17/12 0516 10/21/12 0518  NA 133* 131*  K 4.1 4.0  CL 99 96  CO2 27 26  GLUCOSE 118* 106*  BUN 14 15  CREATININE 0.54 0.53  CALCIUM 8.2* 8.4   Liver Function Tests: No results found for this basename: AST, ALT, ALKPHOS, BILITOT, PROT, ALBUMIN,  in the last 168 hours No results found for this basename: LIPASE, AMYLASE,  in the last 168 hours No results found for this basename: AMMONIA,  in the last 168 hours CBC:  Recent Labs Lab 10/17/12 0516 10/21/12 0518  WBC 15.6* 16.1*  HGB 7.5* 8.8*  HCT 24.7* 27.6*  MCV 75.8* 75.0*  PLT 717* 763*   Cardiac Enzymes: No results  found for this basename: CKTOTAL, CKMB, CKMBINDEX, TROPONINI,  in the last 168 hours BNP: BNP (last 3 results) No results found for this basename: PROBNP,  in the last 8760 hours CBG:  Recent Labs Lab 10/22/12 0749  GLUCAP 107*       Signed:  Osmin Welz,CHRISTOPHER  [facility] 10/22/2012, 4:27 PM

## 2012-10-21 NOTE — Progress Notes (Signed)
[facility] PROGRESS NOTE  [name] Mccoy WUJ:[id number] DOB: [birth year] DOA: 10/12/2012 PCP: Londell Moh, MD  Assessment/Plan: Active Problems:   Multiple sclerosis   Wounds, multiple   Hypokalemia   Anemia   Pressure ulcer, buttock(707.05)   Leukocytosis, unspecified    1. Sepsis: Patient presented with tachycardia, a pyrexia of 101.9, as well as a wcc 17.7. He did meet overall sepsis criteria, although clinically stable. Septic work up was carried out, and patient managed with iv Vancomycin/Levaquin. Urinalysis revealed pyuria, blood and urine cultures remained negative. Chest x-ray negative for any focal infiltrate. Wound cultures revealed gram positive cocci, later identified as MSSA, as well as gram negative rods. Patient's decubiti appears to be the source of infection. WOC was consulted, Dr Darnell Level provided surgery consultation, and patient is s/p sharp debridement in OR on 10/15/12. Vancomycin was discontinued on 10/19/12. Per WOC, patient will benefit from specialist wound care, and will follow up with wound clinic on discharge. 2. Electrolyte abnormalities (Hyponatremia/Hypokalemia): Serum sodium was 133 at presentation, and patient appeared hypovolemic clinically. Managed with iv NS, with achievement of euvolemia and resolution of hyponatremia. Hypokalemia was repleted as indicated.   3. Pressure ulcers: Patient had chronic, non-healing pressure ulcers at presentation, Sacral: 6cm x 5.5cm x .4cm with undermining from 6-10 o'clock measuring 4cm. Small necrotic area (soft yellow/black eschar) at 6 o'clock measuring .5cm x 3cm. Right hip: 7cm x 5cm x .4cm with undermining from 3-8 o'clock measuring 3.5cm. Wound margin from 8-3 o'clock with epibole (rolled, closed wound edges).  See discussion in #1 above. As described above, will need continued wound care by specialist, on discharge.  4. Anemia/Thrombocytosis: Chronic microcytic anemia, likely secondary to chronic  disease. Gradually worsening. As HB was 7.5 on 10/20/12, patient was transfused 1 unit PRBC on that date, with satisfactory bump in HB to 8.8. Thrombocytosis appears reactive.  5. Multiple sclerosis: Clinically at baseline. Has bilateral knee flexion contractures. Continued on home regimen.    Code Status: Full Code.  Family Communication:  Disposition Plan: To be determined.    Brief narrative: 52 y.o. year old male with multiple sclerosis, lower extremity contractures, sacral decubitus and hip pressure ulcers, anemia, thrombocytosis, presenting with dehydration, questionable sepsis. Patient currently lives at home with a in-house caregiver. Per report, physical therapy came by to see the patient while the caregiver was not there, was concerned about the patient being dehydrated, as he had elevated heart rate. Per the caregiver, his home health nurse was made aware of the situation and was he was recommended to call EMS because of the elevated heart rate. Patient states that he has felt overall asymptomatic over the past 24 hours. Patient currently denies any dysuria, nausea, abdominal pain, headache. Per care giver, patient has had some issues with dark urine is also questionable bloody urine consistent with prior UTIs. 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 Wound dressings have been getting changeed and there have been no gross signs of recurrence or infection.  In the Ed, he had a temperature of 101.9 as well as white blood cell count of 17 and a heart rate into the 150s. Admitted for further management.    Consultants:  N/A.   Procedures:  See Notes.   Antibiotics:  Levaquin 10/12/12>>>  Vancomycin 10/12/12-10/16/12.   HPI/Subjective: Feels much better.   Objective: Vital signs in last 24 hours: Temp:  [98.3 F (36.8 C)-99.5 F (37.5 C)] 98.3 F (  36.8 C) (06/05 1334) Pulse Rate:  [120-136] 122 (06/05 1334) Resp:   [18-24] 18 (06/05 1334) BP: (81-104)/(57-69) 100/65 mmHg (06/05 1334) SpO2:  [99 %-100 %] 99 % (06/05 0602) Weight change:  Last BM Date: 10/21/12  Intake/Output from previous day: 06/04 0701 - 06/05 0700 In: 852.5 [P.O.:720; Blood:132.5] Out: 620 [Urine:620] Total I/O In: 120 [P.O.:120] Out: 850 [Urine:850]   Physical Exam: General: Comfortable, alert, communicative, fully oriented, not short of breath at rest.  HEENT:  Mild clinical pallor, no jaundice, no conjunctival injection or discharge. NECK:  Supple, JVP not seen, no carotid bruits, no palpable lymphadenopathy, no palpable goiter. CHEST:  Clinically clear to auscultation, no wheezes, no crackles. HEART:  Sounds 1 and 2 heard, normal, regular, no murmurs. ABDOMEN:  Non-tender, no palpable organomegaly, no palpable masses, normal bowel sounds. GENITALIA:  Not examined.  LOWER EXTREMITIES:  No pitting edema, palpable peripheral pulses. MUSCULOSKELETAL SYSTEM:  Bilateral flexion contractures, in the knees.  CENTRAL NERVOUS SYSTEM:  No focal neurologic deficit on gross examination.  Lab Results:  Recent Labs  10/21/12 0518  WBC 16.1*  HGB 8.8*  HCT 27.6*  PLT 763*    Recent Labs  10/21/12 0518  NA 131*  K 4.0  CL 96  CO2 26  GLUCOSE 106*  BUN 15  CREATININE 0.53  CALCIUM 8.4   Recent Results (from the past 240 hour(s))  MRSA PCR SCREENING     Status: None   Collection Time    10/13/12  3:12 AM      Result Value Range Status   MRSA by PCR NEGATIVE  NEGATIVE Final   Comment:            The GeneXpert MRSA Assay (FDA     approved for NASAL specimens     only), is one component of a     comprehensive MRSA colonization     surveillance program. It is not     intended to diagnose MRSA     infection nor to guide or     monitor treatment for     MRSA infections.  WOUND CULTURE     Status: None   Collection Time    10/13/12  5:00 AM      Result Value Range Status   Specimen Description WOUND RT HIP    Final   Special Requests Normal   Final   Gram Stain     Final   Value: ABUNDANT WBC PRESENT, PREDOMINANTLY PMN     NO SQUAMOUS EPITHELIAL CELLS SEEN     MODERATE GRAM POSITIVE COCCI     IN PAIRS   Culture     Final   Value: FEW STAPHYLOCOCCUS AUREUS     Note: RIFAMPIN AND GENTAMICIN SHOULD NOT BE USED AS SINGLE DRUGS FOR TREATMENT OF STAPH INFECTIONS.   Report Status 10/17/2012 FINAL   Final   Organism ID, Bacteria STAPHYLOCOCCUS AUREUS   Final  WOUND CULTURE     Status: None   Collection Time    10/13/12  5:00 AM      Result Value Range Status   Specimen Description WOUND SACRAL   Final   Special Requests NONE   Final   Gram Stain     Final   Value: ABUNDANT WBC PRESENT, PREDOMINANTLY PMN     NO SQUAMOUS EPITHELIAL CELLS SEEN     ABUNDANT GRAM POSITIVE COCCI     IN PAIRS MODERATE GRAM NEGATIVE RODS   Culture     Final  Value: MULTIPLE ORGANISMS PRESENT, NONE PREDOMINANT     Note: NO STAPHYLOCOCCUS AUREUS ISOLATED NO GROUP A STREP (S.PYOGENES) ISOLATED   Report Status 10/15/2012 FINAL   Final  CULTURE, BLOOD (ROUTINE X 2)     Status: None   Collection Time    10/13/12  5:38 AM      Result Value Range Status   Specimen Description BLOOD RIGHT ARM   Final   Special Requests BOTTLES DRAWN AEROBIC AND ANAEROBIC 5CC   Final   Culture  Setup Time 10/13/2012 08:41   Final   Culture NO GROWTH 5 DAYS   Final   Report Status 10/19/2012 FINAL   Final  CULTURE, BLOOD (ROUTINE X 2)     Status: None   Collection Time    10/13/12  5:40 AM      Result Value Range Status   Specimen Description BLOOD RIGHT HAND   Final   Special Requests BOTTLES DRAWN AEROBIC AND ANAEROBIC 5CC   Final   Culture  Setup Time 10/13/2012 08:41   Final   Culture NO GROWTH 5 DAYS   Final   Report Status 10/19/2012 FINAL   Final  SURGICAL PCR SCREEN     Status: None   Collection Time    10/15/12  4:04 AM      Result Value Range Status   MRSA, PCR NEGATIVE  NEGATIVE Final   Staphylococcus aureus NEGATIVE   NEGATIVE Final   Comment:            The Xpert SA Assay (FDA     approved for NASAL specimens     in patients over 68 years of age),     is one component of     a comprehensive surveillance     program.  Test performance has     been validated by [employer] for patients greater     than or equal to 87 year old.     It is not intended     to diagnose infection nor to     guide or monitor treatment.     Studies/Results: No results found.  Medications: Scheduled Meds: . amantadine  100 mg Oral BID  . collagenase   Topical Daily  . dantrolene  25 mg Oral TID  . ezetimibe  10 mg Oral Daily  . feeding supplement  237 mL Oral TID BM  . gabapentin  600 mg Oral QID  . heparin  5,000 Units Subcutaneous Q8H  . Interferon Beta-1b  0.3 mg Subcutaneous QODAY  . levofloxacin (LEVAQUIN) IV  750 mg Intravenous Q24H  . modafinil  200 mg Oral BID  . multivitamin with minerals  1 tablet Oral Daily  . nutrition supplement  1 packet Oral BID PC  . tiZANidine  4 mg Oral TID PC & HS   Continuous Infusions:  PRN Meds:.acetaminophen, HYDROmorphone (DILAUDID) injection, ondansetron (ZOFRAN) IV, ondansetron, oxyCODONE, polyethylene glycol    LOS: 9 days   Kaleesi Guyton,CHRISTOPHER  [facility] Pager [phone]. If 8PM-8AM, please contact night-coverage at [email or web address], password [facility] 10/21/2012, 1:41 PM  LOS: 9 days

## 2012-10-22 LAB — GLUCOSE, CAPILLARY: Glucose-Capillary: 107 mg/dL — ABNORMAL HIGH (ref 70–99)

## 2012-10-22 MED ORDER — LEVOFLOXACIN 750 MG PO TABS: 750.0000 mg | ORAL_TABLET | Freq: Every day | ORAL | Status: DC

## 2012-10-22 MED ORDER — HYDROCODONE-ACETAMINOPHEN 5-325 MG PO TABS: 1.0000 | ORAL_TABLET | Freq: Three times a day (TID) | ORAL | Status: DC | PRN

## 2012-10-22 NOTE — Progress Notes (Signed)
NUTRITION FOLLOW UP  Intervention:   Continue Ensure Complete po TID, each supplement provides 350 kcal and 13 grams of protein.  Continue Juven BID  Continue Multivitamin with minerals daily   Nutrition Dx:   Increased nutrient needs related to underweight and wound healing as evidenced by pt with multiple stage 3 pressure ulcers and BMI of 16.8; ongoing   Goal:   Pt to meet >/= 90% of their estimated nutrition needs; being met   Monitor:   PO intake; 100% of 2 meals daily, 2-3 Ensure daily, and Juven BID Weight; no new wt since 5/29 Wounds; healing per pt report Labs; low hemoglobin, low sodium  Assessment:   Pt states that he is doing well; reports that he has been eating 100% of 2 meals daily, drinking 2 or 3 Ensure supplements daily, and drinking Juven twice daily. Encouraged pt to continue PO routine at discharge.   Height: Ht Readings from Last 1 Encounters:  10/13/12 6\' 3"  (1.905 m)    Weight Status:   Wt Readings from Last 1 Encounters:  10/14/12 139 lb 5.3 oz (63.2 kg)    Re-estimated needs:  Kcal: 1830-2130  Protein: 91-110 grams  Fluid: 2.1 L  Skin: pressure ulcer on left foot, incisions on coccyx and right hip  Diet Order: General   Intake/Output Summary (Last 24 hours) at 10/22/12 1453 Last data filed at 10/22/12 1415  Gross per 24 hour  Intake    240 ml  Output   1275 ml  Net  -1035 ml    Last BM: 6/6   Labs:   Recent Labs Lab 10/17/12 0516 10/21/12 0518  NA 133* 131*  K 4.1 4.0  CL 99 96  CO2 27 26  BUN 14 15  CREATININE 0.54 0.53  CALCIUM 8.2* 8.4  GLUCOSE 118* 106*    CBG (last 3)   Recent Labs  10/22/12 0749  GLUCAP 107*    Scheduled Meds: . amantadine  100 mg Oral BID  . collagenase   Topical Daily  . dantrolene  25 mg Oral TID  . ezetimibe  10 mg Oral Daily  . feeding supplement  237 mL Oral TID BM  . gabapentin  600 mg Oral QID  . heparin  5,000 Units Subcutaneous Q8H  . Interferon Beta-1b  0.3 mg  Subcutaneous QODAY  . levofloxacin (LEVAQUIN) IV  750 mg Intravenous Q24H  . modafinil  200 mg Oral BID  . multivitamin with minerals  1 tablet Oral Daily  . nutrition supplement  1 packet Oral BID PC  . tiZANidine  4 mg Oral TID PC & HS    Continuous Infusions:   Ian Malkin RD, LDN Inpatient Clinical Dietitian Pager: [phone] After Hours Pager: [phone]

## 2012-10-22 NOTE — Progress Notes (Signed)
Still pending authorization from [employer].  Loeta Herst C. Amanie Mcculley MSW, LCSW [phone]

## 2012-10-22 NOTE — Progress Notes (Signed)
Clinical Social Work  [employer] has received authorization from [employer]. CSW faxed DC summary to SNF and they are agreeable to admission today. CSW prepared DC packet with FL2 and hard scripts included. CSW informed patient and RN of DC plans and both parties agreeable. Patient to call family regarding DC plans. CSW coordinated transportation via [employer]. (request [phone]).  CSW is signing off.  Unk Lightning, LCSW  (Coverage for [employer])

## 2012-10-25 ENCOUNTER — Other Ambulatory Visit: Payer: Self-pay | Admitting: *Deleted

## 2012-10-25 MED ORDER — HYDROCODONE-ACETAMINOPHEN 5-325 MG PO TABS: ORAL_TABLET | ORAL | Status: DC

## 2012-10-26 ENCOUNTER — Non-Acute Institutional Stay (SKILLED_NURSING_FACILITY): Payer: BC Managed Care – PPO | Admitting: Internal Medicine

## 2012-10-26 ENCOUNTER — Other Ambulatory Visit: Payer: Self-pay | Admitting: Internal Medicine

## 2012-10-26 ENCOUNTER — Encounter: Payer: Self-pay | Admitting: Internal Medicine

## 2012-10-26 DIAGNOSIS — G35 Multiple sclerosis: Secondary | ICD-10-CM

## 2012-10-26 DIAGNOSIS — R339 Retention of urine, unspecified: Secondary | ICD-10-CM

## 2012-10-26 DIAGNOSIS — T07XXXA Unspecified multiple injuries, initial encounter: Secondary | ICD-10-CM

## 2012-10-26 DIAGNOSIS — T148XXA Other injury of unspecified body region, initial encounter: Secondary | ICD-10-CM

## 2012-10-26 DIAGNOSIS — L89309 Pressure ulcer of unspecified buttock, unspecified stage: Secondary | ICD-10-CM

## 2012-10-26 NOTE — Progress Notes (Signed)
Date: 10/26/2012  MRN:  [id number] Name:  [name] Mccoy Sex:  male Age:  52 y.o. DOB:[birth year]               Facility/Room:  [facility] [place], [place] [address] Level Of Care:  SNF Provider: Kermit Balo, DO, CMD  Emergency Contacts: Contact Information   Name Relation Home Work [place] Friend [phone]  [phone]     Allergies:No Known Allergies   Chief Complaint  Patient presents with  . Hospitalization Follow-up    new admission s/p hospitalization for pressure ulcer with cellulitis   HPI:  52 yo male with h/o MS on interferon, lower extremity contractures, sacral decubitus ulcer and pressure ulcers of his hips here for continuation of antibiotics for cellulitis and wound care.  He had no complaints when seen.  Gets fevers when he receives his interferon and some tachycardia that then resolves just in time for him to get the next shot.     Past Medical History  Diagnosis Date  . Multiple sclerosis   . Pressure ulcer, buttock(707.05)   . Pain in joint, pelvic region and thigh   . Spasm of muscle   . Unspecified vitamin D deficiency   . Disturbance of skin sensation     Past Surgical History  Procedure Laterality Date  . Wound debridement Right 10/15/2012    Procedure: DEBRIDMENT OF RIGHT HIP DECUBITUS AND SACRAL DECUBITUS;  Surgeon: Velora Heckler, MD;  Location: [facility];  Service: General;  Laterality: Right;  left lateral position, right hip      Current Outpatient Prescriptions  Medication Sig Dispense Refill  . amantadine (SYMMETREL) 100 MG capsule Take 100 mg by mouth 2 (two) times daily.       . collagenase (SANTYL) ointment Apply topically daily. Apply to Unstageable pressure ulcer on coccyx once daily.  Apply 1/8 inch thick and top with saline dressing.  Change daily and PRN for soiling or loosening of dressing x 21 days.  90 g  1  . dantrolene (DANTRIUM) 25 MG capsule Take 25 mg by mouth 3 (three) times daily.      Marland Kitchen ezetimibe (ZETIA) 10  MG tablet Take 10 mg by mouth daily.      . feeding supplement (ENSURE COMPLETE) LIQD Take 237 mLs by mouth 3 (three) times daily between meals.      . gabapentin (NEURONTIN) 600 MG tablet Take 600 mg by mouth 4 (four) times daily.      Marland Kitchen HYDROcodone-acetaminophen (NORCO/VICODIN) 5-325 MG per tablet Take 1 tablet every 8 hours as needed for pain(1-5) Take 2 tablets every 8 hours as needed for pain (6-10).  180 tablet  0  . interferon beta-1b (BETASERON) 0.3 MG injection Inject 0.3 mg into the skin every other day.       . levofloxacin (LEVAQUIN) 750 MG tablet Take 1 tablet (750 mg total) by mouth daily.  7 tablet  0  . modafinil (PROVIGIL) 200 MG tablet Take 200 mg by mouth 2 (two) times daily.       . Multiple Vitamin (MULTIVITAMIN WITH MINERALS) TABS Take 1 tablet by mouth daily.      . nutrition supplement (JUVEN) PACK Take 1 packet by mouth 2 (two) times daily after a meal.      . tiZANidine (ZANAFLEX) 4 MG tablet Take 4 mg by mouth 4 (four) times daily - after meals and at bedtime.       No current facility-administered medications for this visit.  Immunization History  Administered Date(s) Administered  . Influenza Split 07/26/2011     History  Substance Use Topics  . Smoking status: Never Smoker   . Smokeless tobacco: Never Used  . Alcohol Use: No    Family History  Problem Relation Age of Onset  . Hypertension Mother   . Dementia Mother   . Hypertension Father   . Heart disease Father     Review of Systems  Constitutional: Positive for fever and malaise/fatigue. Negative for chills.  HENT: Negative for congestion.   Eyes: Positive for blurred vision.  Respiratory: Negative for shortness of breath.   Cardiovascular: Negative for chest pain, palpitations and leg swelling.  Gastrointestinal: Negative for abdominal pain, blood in stool and melena.  Genitourinary: Negative for dysuria.  Musculoskeletal: Negative for falls.       Painful contractures  Skin: Negative for  itching and rash.       Ulcers on right hip and sacrum  Neurological: Positive for sensory change and weakness. Negative for dizziness, loss of consciousness and headaches.  Endo/Heme/Allergies: Does not bruise/bleed easily.  Psychiatric/Behavioral: Negative for memory loss.    Vital signs: BP 106/59  Pulse 113  Temp(Src) 102.4 F (39.1 C)  Resp 18  Physical Exam  Constitutional: He is oriented to person, place, and time. No distress.  HENT:  Head: Normocephalic and atraumatic.  Eyes: EOM are normal. Pupils are equal, round, and reactive to light.  Neck: Neck supple.  Cardiovascular: Normal rate, regular rhythm, normal heart sounds and intact distal pulses.   Pulmonary/Chest: Effort normal and breath sounds normal. No respiratory distress.  Abdominal: Soft. Bowel sounds are normal. He exhibits no distension. There is no tenderness.  Musculoskeletal: He exhibits no edema.  Neurological: He is alert and oriented to person, place, and time.  Paraplegic, also weakness of right hand, better use of left  Skin:  Ulcer on sacrum, right hip    Plan: 1. Multiple sclerosis -relapsing remitting, but severe -has multiple wounds, contractures of les On interferon   2. Pressure ulcer, buttock(707.05) -here for wound care and to complete abx  3. Wounds, multiple -receiving treatments by wound care nurse, pressure offloading  4. Urinary retention with incomplete bladder emptying -continue catheter -encourage fluids

## 2012-11-04 ENCOUNTER — Ambulatory Visit: Payer: Medicaid Other | Attending: Neurology | Admitting: Physical Therapy

## 2012-11-09 ENCOUNTER — Encounter: Payer: Self-pay | Admitting: Internal Medicine

## 2012-11-09 ENCOUNTER — Non-Acute Institutional Stay (SKILLED_NURSING_FACILITY): Payer: BC Managed Care – PPO | Admitting: Internal Medicine

## 2012-11-09 DIAGNOSIS — G35 Multiple sclerosis: Secondary | ICD-10-CM

## 2012-11-09 DIAGNOSIS — R509 Fever, unspecified: Secondary | ICD-10-CM

## 2012-11-09 NOTE — Progress Notes (Signed)
Patient ID: [name] Mccoy, male   DOB: 05/04/1961, 52 y.o.   MRN: [id number] Location:  [facility]  Provider:  Gwenith Spitz. Renato Gails, D.O., C.M.D.  Code Status:  Full code  Chief Complaint: AV:  Fever HPI:  52 yo male with relapsing remitting MS seen due to acute onset of fever.  Denies myalgias, cough, congestion, nausea, vomiting, diarrhea.  Wound care nurse has not noted any concerns for infection with his pressure ulcer of his buttock of the areas of his lower extremities.  He has no urinary symptoms.  He does note his left arm has become weaker.  His temp was 101 degrees F.  He has just received his betaseron.    Review of Systems: see hpi above for relevant ROS Review of Systems  All other systems reviewed and are negative.   Medications: Patient's Medications  New Prescriptions   No medications on file  Previous Medications   AMANTADINE (SYMMETREL) 100 MG CAPSULE    Take 100 mg by mouth 2 (two) times daily.    COLLAGENASE (SANTYL) OINTMENT    Apply topically daily. Apply to Unstageable pressure ulcer on coccyx once daily.  Apply 1/8 inch thick and top with saline dressing.  Change daily and PRN for soiling or loosening of dressing x 21 days.   DANTROLENE (DANTRIUM) 25 MG CAPSULE    Take 25 mg by mouth 3 (three) times daily.   EZETIMIBE (ZETIA) 10 MG TABLET    Take 10 mg by mouth daily.   FEEDING SUPPLEMENT (ENSURE COMPLETE) LIQD    Take 237 mLs by mouth 3 (three) times daily between meals.   GABAPENTIN (NEURONTIN) 600 MG TABLET    Take 600 mg by mouth 4 (four) times daily.   HYDROCODONE-ACETAMINOPHEN (NORCO/VICODIN) 5-325 MG PER TABLET    Take 1 tablet every 8 hours as needed for pain(1-5) Take 2 tablets every 8 hours as needed for pain (6-10).   INTERFERON BETA-1B (BETASERON) 0.3 MG INJECTION    Inject 0.3 mg into the skin every other day.    LEVOFLOXACIN (LEVAQUIN) 750 MG TABLET    Take 1 tablet (750 mg total) by mouth daily.   MODAFINIL (PROVIGIL) 200 MG TABLET     Take 200 mg by mouth 2 (two) times daily.    MULTIPLE VITAMIN (MULTIVITAMIN WITH MINERALS) TABS    Take 1 tablet by mouth daily.   NUTRITION SUPPLEMENT (JUVEN) PACK    Take 1 packet by mouth 2 (two) times daily after a meal.   TIZANIDINE (ZANAFLEX) 4 MG TABLET    Take 4 mg by mouth 4 (four) times daily - after meals and at bedtime.  Modified Medications   No medications on file  Discontinued Medications   No medications on file    Physical Exam: Filed Vitals:   11/09/12 1230  BP: 106/59  Pulse: 113  Temp: 102.4 F (39.1 C)  Resp: 18  Weight: 134 lb (60.782 kg)  Physical Exam  Constitutional: He is oriented to person, place, and time.  Chronically ill AA, seen in bed.    Cardiovascular: Normal rate, regular rhythm and normal heart sounds.   Pulmonary/Chest: Effort normal and breath sounds normal.  Abdominal: Soft. Bowel sounds are normal.  Musculoskeletal:  Severe lower extremity contractures  Neurological: He is alert and oriented to person, place, and time.  Left upper extremity 3+/5 versus 4+/5 when admitted.  Has been unable to hold his cup or utensils to eat due to weakness  Labs reviewed: Basic Metabolic Panel:  Recent Labs  16/10/96 1700 09/16/12 0530  10/14/12 0755 10/17/12 0516 10/21/12 0518  NA 139 138  < > 136 133* 131*  K 3.6 3.0*  < > 3.4* 4.1 4.0  CL 101 106  < > 103 99 96  CO2 27 23  < > 25 27 26   GLUCOSE 105* 118*  < > 102* 118* 106*  BUN 13 8  < > 7 14 15   CREATININE 0.56 0.48*  < > 0.47* 0.54 0.53  CALCIUM 9.1 7.8*  < > 8.2* 8.2* 8.4  MG  --  1.7  --   --   --   --   PHOS  --  2.3  --   --   --   --   < > = values in this interval not displayed.  Liver Function Tests:  Recent Labs  09/16/12 0530 10/12/12 1904 10/13/12 0538  AST 9 24 13   ALT 5 9 7   ALKPHOS 78 85 69  BILITOT 0.3 1.0 0.7  PROT 6.7 9.4* 7.2  ALBUMIN 1.9* 2.6* 2.0*    CBC:  Recent Labs  09/15/12 1700  10/12/12 1904 10/13/12 0538  10/15/12 0440 10/17/12 0516  10/21/12 0518  WBC 10.9*  < > 17.7* 17.9*  < > 11.5* 15.6* 16.1*  NEUTROABS 8.3*  --  14.4* 14.6*  --   --   --   --   HGB 11.0*  < > 10.8* 7.9*  < > 7.3* 7.5* 8.8*  HCT 34.2*  < > 33.0* 24.8*  < > 24.0* 24.7* 27.6*  MCV 79.0  < > 74.7* 74.3*  < > 76.2* 75.8* 75.0*  PLT 733*  < > 829* 624*  < > 678* 717* 763*  < > = values in this interval not displayed.  Assessment/Plan Multiple sclerosis exacerbation Having increased weakness of left upper extremity vs. Time of admission and it is interfering with his ability to eat properly.  Will start on solumedrol 30mg /kg IV q6h x 48 hrs and monitor for improvement.  Fever, unspecified States it happened before his betaseron.  Has no new evidence for infection.  Question of whether this is due to his ms exacerbation.  If persists, check cbc, bmp, but steroids will now increase wbc--must assess diff for bands.   Family/ staff Communication: discussed with unit supervisor and DNS  Goals of care: full code  Labs/tests ordered:  Will check cbc with diff, bmp if fever returns

## 2012-11-13 DIAGNOSIS — R509 Fever, unspecified: Secondary | ICD-10-CM | POA: Insufficient documentation

## 2012-11-13 DIAGNOSIS — G35 Multiple sclerosis: Secondary | ICD-10-CM | POA: Insufficient documentation

## 2012-11-13 NOTE — Assessment & Plan Note (Addendum)
States it happened before his betaseron.  Has no new evidence for infection.  Question of whether this is due to his ms exacerbation.  If persists, check cbc, bmp, but steroids will now increase wbc--must assess diff for bands.

## 2012-11-13 NOTE — Assessment & Plan Note (Signed)
Having increased weakness of left upper extremity vs. Time of admission and it is interfering with his ability to eat properly.  Will start on solumedrol 30mg /kg IV q6h x 48 hrs and monitor for improvement.

## 2012-12-07 ENCOUNTER — Telehealth: Payer: Self-pay | Admitting: Neurology

## 2012-12-08 NOTE — Telephone Encounter (Signed)
I have called him, his stiffness is getting better.

## 2012-12-08 NOTE — Telephone Encounter (Signed)
Patient is experiencing increasing pain and stiffness. Pain is not as bad, 6 on pain scale, in ball joint area of hip, bilateral, mostly on L. Stiffness in hip also. Advised would inform Dr. Terrace Arabia.

## 2012-12-29 ENCOUNTER — Other Ambulatory Visit: Payer: Self-pay | Admitting: Geriatric Medicine

## 2012-12-30 ENCOUNTER — Telehealth: Payer: Self-pay | Admitting: Diagnostic Neuroimaging

## 2012-12-31 MED ORDER — INTERFERON BETA-1B 0.3 MG ~~LOC~~ KIT: 0.2500 mg | PACK | SUBCUTANEOUS | Status: DC

## 2012-12-31 NOTE — Telephone Encounter (Signed)
Rx sent electronically.  

## 2013-01-03 ENCOUNTER — Inpatient Hospital Stay (HOSPITAL_COMMUNITY): Payer: BC Managed Care – PPO

## 2013-01-03 ENCOUNTER — Inpatient Hospital Stay (HOSPITAL_COMMUNITY)
Admission: EM | Admit: 2013-01-03 | Discharge: 2013-01-08 | DRG: 581 | Disposition: A | Discharge status: Home Health | Payer: BC Managed Care – PPO | Attending: Internal Medicine | Admitting: Internal Medicine

## 2013-01-03 ENCOUNTER — Encounter (HOSPITAL_COMMUNITY): Payer: Self-pay

## 2013-01-03 ENCOUNTER — Emergency Department (HOSPITAL_COMMUNITY): Payer: BC Managed Care – PPO

## 2013-01-03 DIAGNOSIS — T148XXA Other injury of unspecified body region, initial encounter: Secondary | ICD-10-CM

## 2013-01-03 DIAGNOSIS — N200 Calculus of kidney: Secondary | ICD-10-CM | POA: Insufficient documentation

## 2013-01-03 DIAGNOSIS — M87059 Idiopathic aseptic necrosis of unspecified femur: Secondary | ICD-10-CM | POA: Diagnosis present

## 2013-01-03 DIAGNOSIS — M6282 Rhabdomyolysis: Secondary | ICD-10-CM

## 2013-01-03 DIAGNOSIS — G35 Multiple sclerosis: Secondary | ICD-10-CM | POA: Diagnosis present

## 2013-01-03 DIAGNOSIS — R197 Diarrhea, unspecified: Secondary | ICD-10-CM | POA: Diagnosis present

## 2013-01-03 DIAGNOSIS — E46 Unspecified protein-calorie malnutrition: Secondary | ICD-10-CM | POA: Diagnosis present

## 2013-01-03 DIAGNOSIS — D509 Iron deficiency anemia, unspecified: Secondary | ICD-10-CM | POA: Diagnosis present

## 2013-01-03 DIAGNOSIS — T07XXXA Unspecified multiple injuries, initial encounter: Secondary | ICD-10-CM

## 2013-01-03 DIAGNOSIS — R339 Retention of urine, unspecified: Secondary | ICD-10-CM

## 2013-01-03 DIAGNOSIS — A419 Sepsis, unspecified organism: Secondary | ICD-10-CM | POA: Diagnosis present

## 2013-01-03 DIAGNOSIS — M25559 Pain in unspecified hip: Secondary | ICD-10-CM

## 2013-01-03 DIAGNOSIS — N1330 Unspecified hydronephrosis: Secondary | ICD-10-CM | POA: Diagnosis present

## 2013-01-03 DIAGNOSIS — E860 Dehydration: Secondary | ICD-10-CM

## 2013-01-03 DIAGNOSIS — L89109 Pressure ulcer of unspecified part of back, unspecified stage: Secondary | ICD-10-CM | POA: Diagnosis present

## 2013-01-03 DIAGNOSIS — S72009A Fracture of unspecified part of neck of unspecified femur, initial encounter for closed fracture: Secondary | ICD-10-CM | POA: Diagnosis present

## 2013-01-03 DIAGNOSIS — L89309 Pressure ulcer of unspecified buttock, unspecified stage: Secondary | ICD-10-CM

## 2013-01-03 DIAGNOSIS — K5641 Fecal impaction: Secondary | ICD-10-CM

## 2013-01-03 DIAGNOSIS — M62838 Other muscle spasm: Secondary | ICD-10-CM

## 2013-01-03 DIAGNOSIS — W19XXXA Unspecified fall, initial encounter: Secondary | ICD-10-CM

## 2013-01-03 DIAGNOSIS — D473 Essential (hemorrhagic) thrombocythemia: Secondary | ICD-10-CM

## 2013-01-03 DIAGNOSIS — E559 Vitamin D deficiency, unspecified: Secondary | ICD-10-CM | POA: Diagnosis present

## 2013-01-03 DIAGNOSIS — L89209 Pressure ulcer of unspecified hip, unspecified stage: Secondary | ICD-10-CM | POA: Diagnosis present

## 2013-01-03 DIAGNOSIS — N390 Urinary tract infection, site not specified: Secondary | ICD-10-CM | POA: Diagnosis present

## 2013-01-03 DIAGNOSIS — I959 Hypotension, unspecified: Secondary | ICD-10-CM

## 2013-01-03 DIAGNOSIS — R7309 Other abnormal glucose: Secondary | ICD-10-CM | POA: Diagnosis present

## 2013-01-03 DIAGNOSIS — L8993 Pressure ulcer of unspecified site, stage 3: Secondary | ICD-10-CM | POA: Diagnosis present

## 2013-01-03 DIAGNOSIS — N179 Acute kidney failure, unspecified: Secondary | ICD-10-CM | POA: Diagnosis present

## 2013-01-03 DIAGNOSIS — R509 Fever, unspecified: Secondary | ICD-10-CM

## 2013-01-03 DIAGNOSIS — N319 Neuromuscular dysfunction of bladder, unspecified: Secondary | ICD-10-CM

## 2013-01-03 DIAGNOSIS — D638 Anemia in other chronic diseases classified elsewhere: Secondary | ICD-10-CM | POA: Diagnosis present

## 2013-01-03 DIAGNOSIS — R000 Tachycardia, unspecified: Secondary | ICD-10-CM | POA: Diagnosis present

## 2013-01-03 DIAGNOSIS — G8220 Paraplegia, unspecified: Secondary | ICD-10-CM | POA: Diagnosis present

## 2013-01-03 DIAGNOSIS — A4159 Other Gram-negative sepsis: Principal | ICD-10-CM | POA: Diagnosis present

## 2013-01-03 DIAGNOSIS — D649 Anemia, unspecified: Secondary | ICD-10-CM

## 2013-01-03 DIAGNOSIS — N3289 Other specified disorders of bladder: Secondary | ICD-10-CM | POA: Diagnosis present

## 2013-01-03 DIAGNOSIS — R6510 Systemic inflammatory response syndrome (SIRS) of non-infectious origin without acute organ dysfunction: Secondary | ICD-10-CM

## 2013-01-03 DIAGNOSIS — D72829 Elevated white blood cell count, unspecified: Secondary | ICD-10-CM

## 2013-01-03 DIAGNOSIS — Z7401 Bed confinement status: Secondary | ICD-10-CM

## 2013-01-03 DIAGNOSIS — R911 Solitary pulmonary nodule: Secondary | ICD-10-CM | POA: Diagnosis present

## 2013-01-03 DIAGNOSIS — Z681 Body mass index (BMI) 19 or less, adult: Secondary | ICD-10-CM

## 2013-01-03 DIAGNOSIS — N419 Inflammatory disease of prostate, unspecified: Secondary | ICD-10-CM | POA: Diagnosis present

## 2013-01-03 DIAGNOSIS — E871 Hypo-osmolality and hyponatremia: Secondary | ICD-10-CM | POA: Diagnosis present

## 2013-01-03 DIAGNOSIS — L8994 Pressure ulcer of unspecified site, stage 4: Secondary | ICD-10-CM | POA: Diagnosis present

## 2013-01-03 DIAGNOSIS — X58XXXA Exposure to other specified factors, initial encounter: Secondary | ICD-10-CM | POA: Diagnosis present

## 2013-01-03 DIAGNOSIS — E876 Hypokalemia: Secondary | ICD-10-CM | POA: Diagnosis not present

## 2013-01-03 DIAGNOSIS — N201 Calculus of ureter: Secondary | ICD-10-CM

## 2013-01-03 HISTORY — DX: Calculus of ureter: N20.1

## 2013-01-03 HISTORY — DX: Calculus of kidney: N20.0

## 2013-01-03 HISTORY — DX: Neuromuscular dysfunction of bladder, unspecified: N31.9

## 2013-01-03 HISTORY — DX: Retention of urine, unspecified: R33.9

## 2013-01-03 LAB — CORTISOL: Cortisol, Plasma: 67.2 ug/dL

## 2013-01-03 LAB — MRSA PCR SCREENING: MRSA by PCR: POSITIVE — AB

## 2013-01-03 LAB — CBC
HCT: 29.8 % — ABNORMAL LOW (ref 39.0–52.0)
Hemoglobin: 9.8 g/dL — ABNORMAL LOW (ref 13.0–17.0)
MCH: 24.6 pg — ABNORMAL LOW (ref 26.0–34.0)
MCHC: 32.9 g/dL (ref 30.0–36.0)
MCV: 74.7 fL — ABNORMAL LOW (ref 78.0–100.0)

## 2013-01-03 LAB — COMPREHENSIVE METABOLIC PANEL
BUN: 70 mg/dL — ABNORMAL HIGH (ref 6–23)
CO2: 19 mEq/L (ref 19–32)
Calcium: 7.8 mg/dL — ABNORMAL LOW (ref 8.4–10.5)
Creatinine, Ser: 3.62 mg/dL — ABNORMAL HIGH (ref 0.50–1.35)
GFR calc Af Amer: 21 mL/min — ABNORMAL LOW (ref >90)
GFR calc non Af Amer: 18 mL/min — ABNORMAL LOW (ref >90)
Glucose, Bld: 167 mg/dL — ABNORMAL HIGH (ref 70–99)
Sodium: 131 mEq/L — ABNORMAL LOW (ref 135–145)
Total Protein: 7.7 g/dL (ref 6.0–8.3)

## 2013-01-03 LAB — URINALYSIS, ROUTINE W REFLEX MICROSCOPIC
Nitrite: NEGATIVE
Protein, ur: 30 mg/dL — AB
Specific Gravity, Urine: 1.015 (ref 1.005–1.030)
Urobilinogen, UA: 0.2 mg/dL (ref 0.0–1.0)

## 2013-01-03 LAB — TROPONIN I: Troponin I: <0.3 ng/mL (ref <0.30)

## 2013-01-03 LAB — APTT: aPTT: 37 seconds (ref 24–37)

## 2013-01-03 LAB — TYPE AND SCREEN

## 2013-01-03 LAB — PROTIME-INR: Prothrombin Time: 16.7 seconds — ABNORMAL HIGH (ref 11.6–15.2)

## 2013-01-03 LAB — LACTIC ACID, PLASMA
Lactic Acid, Venous: 2.6 mmol/L — ABNORMAL HIGH (ref 0.5–2.2)
Lactic Acid, Venous: 1.5 mmol/L (ref 0.5–2.2)

## 2013-01-03 LAB — URINE MICROSCOPIC-ADD ON

## 2013-01-03 LAB — FIBRINOGEN: Fibrinogen: 772 mg/dL — ABNORMAL HIGH (ref 204–475)

## 2013-01-03 LAB — PROCALCITONIN: Procalcitonin: >175 ng/mL

## 2013-01-03 MED ORDER — BIOTENE DRY MOUTH MT LIQD: 15.0000 mL | Freq: Two times a day (BID) | OROMUCOSAL | Status: DC

## 2013-01-03 MED ORDER — SODIUM CHLORIDE 0.9 % IV BOLUS (SEPSIS)
1000.0000 mL | Freq: Once | INTRAVENOUS | Status: AC
  Administered 2013-01-03: 1000 mL via INTRAVENOUS

## 2013-01-03 MED ORDER — DANTROLENE SODIUM 25 MG PO CAPS
25.0000 mg | ORAL_CAPSULE | Freq: Three times a day (TID) | ORAL | Status: DC
  Administered 2013-01-03 – 2013-01-08 (×14): 25 mg via ORAL
  Filled 2013-01-03 (×17): qty 1

## 2013-01-03 MED ORDER — FENTANYL CITRATE 0.05 MG/ML IJ SOLN
INTRAMUSCULAR | Status: AC
  Filled 2013-01-03: qty 6

## 2013-01-03 MED ORDER — VANCOMYCIN HCL IN DEXTROSE 750-5 MG/150ML-% IV SOLN
750.0000 mg | INTRAVENOUS | Status: DC
  Filled 2013-01-03: qty 150

## 2013-01-03 MED ORDER — SODIUM CHLORIDE 0.9 % IV BOLUS (SEPSIS)
1000.0000 mL | INTRAVENOUS | Status: AC | PRN
  Administered 2013-01-03 (×2): 1000 mL via INTRAVENOUS

## 2013-01-03 MED ORDER — PIPERACILLIN-TAZOBACTAM 3.375 G IVPB
3.3750 g | Freq: Three times a day (TID) | INTRAVENOUS | Status: DC
  Administered 2013-01-03 – 2013-01-06 (×9): 3.375 g via INTRAVENOUS
  Filled 2013-01-03 (×10): qty 50

## 2013-01-03 MED ORDER — ADULT MULTIVITAMIN W/MINERALS CH
1.0000 | ORAL_TABLET | Freq: Every day | ORAL | Status: DC
  Administered 2013-01-04 – 2013-01-08 (×5): 1 via ORAL
  Filled 2013-01-03 (×6): qty 1

## 2013-01-03 MED ORDER — TIZANIDINE HCL 4 MG PO TABS: 4.0000 mg | ORAL_TABLET | Freq: Three times a day (TID) | ORAL | Status: DC

## 2013-01-03 MED ORDER — PIPERACILLIN-TAZOBACTAM 3.375 G IVPB 30 MIN
3.3750 g | INTRAVENOUS | Status: AC
  Administered 2013-01-03: 3.375 g via INTRAVENOUS
  Filled 2013-01-03: qty 50

## 2013-01-03 MED ORDER — ACETAMINOPHEN 650 MG RE SUPP
650.0000 mg | Freq: Once | RECTAL | Status: AC
  Administered 2013-01-03: 650 mg via RECTAL
  Filled 2013-01-03: qty 1

## 2013-01-03 MED ORDER — MIDAZOLAM HCL 2 MG/2ML IJ SOLN
INTRAMUSCULAR | Status: AC | PRN
  Administered 2013-01-03: 0.5 mg via INTRAVENOUS

## 2013-01-03 MED ORDER — LIDOCAINE HCL 1 % IJ SOLN
INTRAMUSCULAR | Status: AC
  Filled 2013-01-03: qty 20

## 2013-01-03 MED ORDER — MODAFINIL 200 MG PO TABS: 200.0000 mg | ORAL_TABLET | Freq: Two times a day (BID) | ORAL | Status: DC

## 2013-01-03 MED ORDER — PHENYLEPHRINE HCL 10 MG/ML IJ SOLN
30.0000 ug/min | INTRAVENOUS | Status: DC
  Administered 2013-01-03: 20 ug/min via INTRAVENOUS
  Filled 2013-01-03: qty 1

## 2013-01-03 MED ORDER — HEPARIN SODIUM (PORCINE) 5000 UNIT/ML IJ SOLN
5000.0000 [IU] | Freq: Three times a day (TID) | INTRAMUSCULAR | Status: DC
  Administered 2013-01-03 – 2013-01-08 (×15): 5000 [IU] via SUBCUTANEOUS
  Filled 2013-01-03 (×18): qty 1

## 2013-01-03 MED ORDER — SODIUM CHLORIDE 0.9 % IV SOLN
INTRAVENOUS | Status: DC
  Administered 2013-01-03 – 2013-01-07 (×6): via INTRAVENOUS

## 2013-01-03 MED ORDER — SODIUM CHLORIDE 0.9 % IV SOLN
250.0000 mL | INTRAVENOUS | Status: DC | PRN
  Administered 2013-01-03: 250 mL via INTRAVENOUS

## 2013-01-03 MED ORDER — FENTANYL CITRATE 0.05 MG/ML IJ SOLN
INTRAMUSCULAR | Status: AC | PRN
  Administered 2013-01-03 (×2): 50 ug via INTRAVENOUS

## 2013-01-03 MED ORDER — ENSURE COMPLETE PO LIQD
237.0000 mL | Freq: Three times a day (TID) | ORAL | Status: DC
  Administered 2013-01-04 – 2013-01-07 (×11): 237 mL via ORAL
  Filled 2013-01-03 (×2): qty 237

## 2013-01-03 MED ORDER — JUVEN PO PACK
1.0000 | PACK | Freq: Two times a day (BID) | ORAL | Status: DC
  Administered 2013-01-04 – 2013-01-08 (×9): 1 via ORAL
  Filled 2013-01-03 (×10): qty 1

## 2013-01-03 MED ORDER — GABAPENTIN 300 MG PO CAPS
600.0000 mg | ORAL_CAPSULE | Freq: Four times a day (QID) | ORAL | Status: DC
  Administered 2013-01-03 – 2013-01-08 (×19): 600 mg via ORAL
  Filled 2013-01-03 (×23): qty 2

## 2013-01-03 MED ORDER — AMANTADINE HCL 100 MG PO CAPS
100.0000 mg | ORAL_CAPSULE | ORAL | Status: DC
  Filled 2013-01-03: qty 1

## 2013-01-03 MED ORDER — PIPERACILLIN-TAZOBACTAM 4.5 G IVPB: 4.5000 g | Freq: Once | INTRAVENOUS | Status: DC

## 2013-01-03 MED ORDER — MIDAZOLAM HCL 2 MG/2ML IJ SOLN
INTRAMUSCULAR | Status: AC
  Filled 2013-01-03: qty 6

## 2013-01-03 MED ORDER — VANCOMYCIN HCL 10 G IV SOLR
1250.0000 mg | INTRAVENOUS | Status: AC
  Administered 2013-01-03: 1250 mg via INTRAVENOUS
  Filled 2013-01-03: qty 1250

## 2013-01-03 MED ORDER — CHLORHEXIDINE GLUCONATE 0.12 % MT SOLN
15.0000 mL | Freq: Two times a day (BID) | OROMUCOSAL | Status: DC
  Administered 2013-01-03 – 2013-01-04 (×2): 15 mL via OROMUCOSAL
  Filled 2013-01-03 (×2): qty 15

## 2013-01-03 MED ORDER — JUVEN PO PACK: 1.0000 | PACK | Freq: Two times a day (BID) | ORAL | Status: DC

## 2013-01-03 MED ORDER — INTERFERON BETA-1B 0.3 MG ~~LOC~~ KIT: 0.2500 mg | PACK | SUBCUTANEOUS | Status: DC

## 2013-01-03 MED ORDER — AMANTADINE HCL 100 MG PO CAPS
100.0000 mg | ORAL_CAPSULE | Freq: Two times a day (BID) | ORAL | Status: DC
  Filled 2013-01-03 (×2): qty 1

## 2013-01-03 MED ORDER — IOHEXOL 300 MG/ML  SOLN
10.0000 mL | Freq: Once | INTRAMUSCULAR | Status: AC | PRN
  Administered 2013-01-03: 10 mL

## 2013-01-03 MED ORDER — GABAPENTIN 600 MG PO TABS
600.0000 mg | ORAL_TABLET | Freq: Four times a day (QID) | ORAL | Status: DC
  Filled 2013-01-03 (×3): qty 1

## 2013-01-03 NOTE — ED Provider Notes (Signed)
CSN: [id number]     Arrival date & time 01/03/13  0708 History     First MD Initiated Contact with Patient 01/03/13 0725     Chief Complaint  Patient presents with  . Weakness   (Consider location/radiation/quality/duration/timing/severity/associated sxs/prior Treatment) HPI Comments: Generalized weakness with lethargy and confusion per caregiver. Started 2 days ago, worsening. No alleviating or exacerbating factors. Hx of UTIs previously.   Patient is a 52 y.o. male presenting with weakness. The history is provided by the patient.  Weakness This is a new problem. The current episode started 2 days ago. The problem occurs constantly. The problem has been gradually worsening. Pertinent negatives include no chest pain, no abdominal pain, no headaches and no shortness of breath. Nothing aggravates the symptoms. Nothing relieves the symptoms.    Past Medical History  Diagnosis Date  . Multiple sclerosis   . Pressure ulcer, buttock(707.05)   . Pain in joint, pelvic region and thigh   . Spasm of muscle   . Unspecified vitamin D deficiency   . Disturbance of skin sensation    Past Surgical History  Procedure Laterality Date  . Wound debridement Right 10/15/2012    Procedure: DEBRIDMENT OF RIGHT HIP DECUBITUS AND SACRAL DECUBITUS;  Surgeon: Velora Heckler, MD;  Location: [facility];  Service: General;  Laterality: Right;  left lateral position, right hip   Family History  Problem Relation Age of Onset  . Hypertension Mother   . Dementia Mother   . Hypertension Father   . Heart disease Father    History  Substance Use Topics  . Smoking status: Never Smoker   . Smokeless tobacco: Never Used  . Alcohol Use: No    Review of Systems  Constitutional: Positive for fever.  Respiratory: Negative for cough and shortness of breath.   Cardiovascular: Negative for chest pain.  Gastrointestinal: Positive for diarrhea. Negative for nausea, vomiting and abdominal pain.  Neurological: Positive  for weakness. Negative for headaches.  All other systems reviewed and are negative.    Allergies  Review of patient's allergies indicates no known allergies.  Home Medications   Current Outpatient Rx  Name  Route  Sig  Dispense  Refill  . amantadine (SYMMETREL) 100 MG capsule   Oral   Take 100 mg by mouth 2 (two) times daily.          . collagenase (SANTYL) ointment   Topical   Apply topically daily. Apply to Unstageable pressure ulcer on coccyx once daily.  Apply 1/8 inch thick and top with saline dressing.  Change daily and PRN for soiling or loosening of dressing x 21 days.   90 g   1   . dantrolene (DANTRIUM) 25 MG capsule   Oral   Take 25 mg by mouth 3 (three) times daily.         Marland Kitchen ezetimibe (ZETIA) 10 MG tablet   Oral   Take 10 mg by mouth daily.         . feeding supplement (ENSURE COMPLETE) LIQD   Oral   Take 237 mLs by mouth 3 (three) times daily between meals.         . gabapentin (NEURONTIN) 600 MG tablet   Oral   Take 600 mg by mouth 4 (four) times daily.         Marland Kitchen HYDROcodone-acetaminophen (NORCO/VICODIN) 5-325 MG per tablet      Take 1 tablet every 8 hours as needed for pain(1-5) Take 2 tablets every 8 hours  as needed for pain (6-10).   180 tablet   0   . Interferon Beta-1b (BETASERON) 0.3 MG KIT injection   Subcutaneous   Inject 0.25 mg into the skin every other day.   1 kit   1     Attn Latoya   . levofloxacin (LEVAQUIN) 750 MG tablet   Oral   Take 1 tablet (750 mg total) by mouth daily.   7 tablet   0   . modafinil (PROVIGIL) 200 MG tablet   Oral   Take 200 mg by mouth 2 (two) times daily.          . Multiple Vitamin (MULTIVITAMIN WITH MINERALS) TABS   Oral   Take 1 tablet by mouth daily.         . nutrition supplement (JUVEN) PACK   Oral   Take 1 packet by mouth 2 (two) times daily after a meal.         . tiZANidine (ZANAFLEX) 4 MG tablet   Oral   Take 4 mg by mouth 4 (four) times daily - after meals and at  bedtime.          BP 76/46  Pulse 155  Temp(Src) 104.9 F (40.5 C) (Rectal)  SpO2 95% Physical Exam  Nursing note and vitals reviewed. Constitutional: He is oriented to person, place, and time. He appears well-developed and well-nourished. No distress.  HENT:  Head: Normocephalic and atraumatic.  Mouth/Throat: No oropharyngeal exudate.  Eyes: EOM are normal. Pupils are equal, round, and reactive to light.  Neck: Normal range of motion. Neck supple.  Cardiovascular: Normal rate and regular rhythm.  Exam reveals no friction rub.   No murmur heard. Pulmonary/Chest: Effort normal and breath sounds normal. No respiratory distress. He has no wheezes. He has no rales.  Abdominal: He exhibits no distension. There is no tenderness. There is no rebound.  Musculoskeletal: Normal range of motion. He exhibits no edema.  Neurological: He is alert and oriented to person, place, and time.  Bilateral lower extremites contracted  Skin: He is not diaphoretic.  Large pressure ulcer on R hip, c/d/i, beefy red without drainage or signs of infection.    ED Course   Procedures (including critical care time)  Labs Reviewed  CBC - Abnormal; Notable for the following:    WBC 11.1 (*)    RBC 3.99 (*)    Hemoglobin 9.8 (*)    HCT 29.8 (*)    MCV 74.7 (*)    MCH 24.6 (*)    RDW 16.1 (*)    All other components within normal limits  CULTURE, BLOOD (ROUTINE X 2)  CULTURE, BLOOD (ROUTINE X 2)  URINE CULTURE  COMPREHENSIVE METABOLIC PANEL  LACTIC ACID, PLASMA  URINALYSIS, ROUTINE W REFLEX MICROSCOPIC  CORTISOL  TROPONIN I  PROTIME-INR  APTT  FIBRINOGEN  TYPE AND SCREEN   Dg Chest Portable 1 View  01/03/2013   *RADIOLOGY REPORT*  Clinical Data: Fever, dizziness and weakness.  PORTABLE CHEST - 1 VIEW  Comparison: Single view of the chest 10/12/2012 and 09/15/2012.  Findings: Again seen is right upper lobe scarring.  The lungs appear emphysematous.  No pneumothorax or pleural effusion is  identified.  Heart size is normal.  IMPRESSION:  1.  No acute finding. 2.  Pulmonary hyperinflation and right upper lobe scarring, unchanged.   Original Report Authenticated By: Holley Dexter, M.D.   1. Severe sepsis   2. Severe sepsis with acute organ dysfunction   3. Hypotension  4. Tachycardia   5. UTI (urinary tract infection)   6. Renal failure, acute     Angiocath insertion Performed by: Dagmar Hait  Consent: Verbal consent obtained. Risks and benefits: risks, benefits and alternatives were discussed Time out: Immediately prior to procedure a "time out" was called to verify the correct patient, procedure, equipment, support staff and site/side marked as required.  Preparation: Patient was prepped and draped in the usual sterile fashion.  Vein Location: R EJ  Not Ultrasound Guided  Gauge: 18  Normal blood return and flush without difficulty Patient tolerance: Patient tolerated the procedure well with no immediate complications.  Angiocath insertion Performed by: Dagmar Hait  Consent: Verbal consent obtained. Risks and benefits: risks, benefits and alternatives were discussed Time out: Immediately prior to procedure a "time out" was called to verify the correct patient, procedure, equipment, support staff and site/side marked as required.  Preparation: Patient was prepped and draped in the usual sterile fashion.  Vein Location: L EJ  Not Ultrasound Guided  Gauge: 20  Normal blood return and flush without difficulty Patient tolerance: Patient tolerated the procedure well with no immediate complications.  Angiocath insertion Performed by: Dagmar Hait  Consent: Verbal consent obtained. Risks and benefits: risks, benefits and alternatives were discussed Time out: Immediately prior to procedure a "time out" was called to verify the correct patient, procedure, equipment, support staff and site/side marked as required.  Preparation:  Patient was prepped and draped in the usual sterile fashion.  Vein Location: R AC  Yes Ultrasound Guided  Gauge: 20  Normal blood return and flush without difficulty Patient tolerance: Patient tolerated the procedure well with no immediate complications.   Date: 01/03/2013  Rate: 141  Rhythm: sinus tachycardia  QRS Axis: normal  Intervals: normal  ST/T Wave abnormalities: normal  Conduction Disutrbances:none  Narrative Interpretation:   Old EKG Reviewed: unchanged  CRITICAL CARE Performed by: Dagmar Hait   Total critical care time: 30 minutes  Critical care time was exclusive of separately billable procedures and treating other patients.  Critical care was necessary to treat or prevent imminent or life-threatening deterioration.  Critical care was time spent personally by me on the following activities: development of treatment plan with patient and/or surrogate as well as nursing, discussions with consultants, evaluation of patient's response to treatment, examination of patient, obtaining history from patient or surrogate, ordering and performing treatments and interventions, ordering and review of laboratory studies, ordering and review of radiographic studies, pulse oximetry and re-evaluation of patient's condition.    MDM  19M with hx MS - bedbound. Presents with lethargy, hallucinations per care giver. Talking, oriented, cooperative with me. Hypotensive, marked tachycardia, febrile. Concern for severe sepsis. I placed 3 IVs initially to aid with resuscitation in bilateral EJs and R AC. Labs sent - pressures improving with resuscitation. Code Sepsis initaited with Vanc/Zosyn ordered for broad spectrum antibiotics. Patient has indwelling Foley - likely source of infection, has hx of similar.  Labs show ARF, will scan to look for possible stone. Mild leukocytosis. Mild bump in lactate. UA c/w UTI. Pressures remaining in 90s with fluids. 3rd liter of NS given.   Critical care consulted and will admit.  I have reviewed all labs and imaging and considered them in my medical decision making.   Dagmar Hait, MD 01/03/13 [id number]

## 2013-01-03 NOTE — Consult Note (Signed)
Subjective: I was asked to see Mr. Forti in consultation by Dr. Delton Coombes for a right proximal stone with obstruction and urosepsis.   Mr. Vroom is a 52 yo BM with a 30+ year history of MS.  He has never had prior urologic care.   He was admitted to the ICU via the ER after being brought in with the onset over the last 24 hrs of fever to 104 and delirium.   He was found on CT scanning to have a 1.3cm right proximal stone with a right renal stone and bilateral hydro with a distended bladder.   He has been managed with chronic foley catheter drainage but an attempt to change the catheter in the ER was unsuccessful and I&O cath only produced some debris.   On my review of the CT the prostate is very hazy with a possible large fossa that would suggest the foley had been in the prostatic urethra and he may have prostatitis as well.   Apart from the fever and delirium he has had no other significant complaints.  ROS: Negative except as above.   On a full review he denies flank pain, hematuria, abdominal pain, chest pain or shortness of breath.  He has had no nausea.   His mental confusion has resolved.   He reports intact sensation but is bed bound with contracted extremities with bilateral hip fractures on CT but no pain.     Past Medical History  Diagnosis Date  . Multiple sclerosis   . Pressure ulcer, buttock(707.05)   . Pain in joint, pelvic region and thigh   . Spasm of muscle   . Unspecified vitamin D deficiency   . Disturbance of skin sensation   . Right ureteral stone 01/03/13  . Renal stones   . Neurogenic bladder   . Urinary retention with incomplete bladder emptying    Past Surgical History  Procedure Laterality Date  . Wound debridement Right 10/15/2012    Procedure: DEBRIDMENT OF RIGHT HIP DECUBITUS AND SACRAL DECUBITUS;  Surgeon: Velora Heckler, MD;  Location: [facility];  Service: General;  Laterality: Right;  left lateral position, right hip   History   Social History  . Marital  Status: Single    Spouse Name: N/A    Number of Children: N/A  . Years of Education: N/A   Occupational History  . Not on file.   Social History Main Topics  . Smoking status: Never Smoker   . Smokeless tobacco: Never Used  . Alcohol Use: No  . Drug Use: No  . Sexual Activity: No   Other Topics Concern  . Not on file   Social History Narrative   Patient is single and lives at home alone. Patient is disabled.   Family History  Problem Relation Age of Onset  . Hypertension Mother   . Dementia Mother   . Hypertension Father   . Heart disease Father    No Known Allergies  Meds reviewed below.     Objective: Vital signs in last 24 hours: Temp:  [101.4 F (38.6 C)-104.9 F (40.5 C)] 101.4 F (38.6 C) (08/18 1111) Pulse Rate:  [128-162] 129 (08/18 1115) Resp:  [12-28] 20 (08/18 1207) BP: (72-110)/(45-69) 72/45 mmHg (08/18 1207) SpO2:  [95 %-98 %] 95 % (08/18 1115) Weight:  [60.782 kg (134 lb)-65 kg (143 lb 4.8 oz)] 65 kg (143 lb 4.8 oz) (08/18 1207)  Intake/Output from previous day:   Intake/Output this shift: Total I/O In:  102.1 [I.V.:102.1] Out: 550 [Urine:550]  General appearance: alert and no distress Head: Normocephalic, without obvious abnormality, atraumatic Neck: no adenopathy, no carotid bruit, no JVD, supple, symmetrical, trachea midline and thyroid not enlarged, symmetric, no tenderness/mass/nodules Resp: clear to auscultation bilaterally Cardio: tachycardic with a normal rhythm.  No murmurs noted.  GI: soft, non-tender; bowel sounds normal; no masses,  no organomegaly Male genitalia: normal, but difficulty to examine because of the contracted extremities.   He is circumcised and the meatus is normal.  The scrotum is normal without swelling or erythema and the testes and epididymes are normal without mass or tenderness.  Rectal exam demonstrates decreased tone, no rectal masses and the prostate is small and some.  the SV's are non-palpable.   Extremities: normal UE's and contracted withered LE's.  Skin: warm and dry with ischial decubiti.  Lymph nodes: Cervical, supraclavicular, and axillary nodes normal. Neurologic: Mental status: Alert, oriented, thought content appropriate Sensory: normal Motor: paraplegia with spasticity.    Lab Results:   Recent Labs  01/03/13 0441  WBC 11.1*  HGB 9.8*  HCT 29.8*  PLT 360   BMET  Recent Labs  01/03/13 0441  NA 131*  K 4.5  CL 97  CO2 19  GLUCOSE 167*  BUN 70*  CREATININE 3.62*  CALCIUM 7.8*   PT/INR  Recent Labs  01/03/13 0441  LABPROT 16.7*  INR 1.39   ABG No results found for this basename: PHART, PCO2, PO2, HCO3,  in the last 72 hours  Studies/Results: Ct Abdomen Pelvis Wo Contrast  01/03/2013   *RADIOLOGY REPORT*  Clinical Data: Generalized weakness.  Question UTI.  Lethargic. Bed sores.  Sore throat.  Multiple sclerosis.  CT ABDOMEN AND PELVIS WITHOUT CONTRAST  Technique:  Multidetector CT imaging of the abdomen and pelvis was performed following the standard protocol without intravenous contrast.  Comparison: 07/28/2011.  Findings: Proximal right ureteral 1.3 cm obstructing stone with mild to moderate right-sided hydronephrosis.  Several nonobstructing right renal calculi noted.  Small nonobstructing left lower pole renal calculi.  There is fullness of the left renal collecting system.  No definitive left ureteral obstructing stone.  Fullness of the collecting systems may be partially explained by bladder outlet obstruction as the bladder is prominent in size. Calcification along the posterior aspect of the bladder wall may reflect changes of chronic distension/inflammation.  Right lung base 3.7 mm nodule (series 4 image 7) not previously imaged. Given risk factors for bronchogenic carcinoma, follow-up chest CT at 1 year is recommended.  This recommendation follows the consensus statement:  Guidelines for Management of Small Pulmonary Nodules Detected on CT Scans:   A Statement from the Fleischner Society as published in Radiology 2005; 237:395-400.  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  No CT evidence of pericholecystic inflammation.  Ultrasound can be obtained for further delineation if this is of clinical concern.  Circumferential thickening rectosigmoid region may be related to under distension.  Inflammation not excluded.  No free intraperitoneal air.  Evaluation of solid abdominal viscera is limited by lack of IV contrast.  Taking this limitation into account:  No worrisome hepatic, splenic, adrenal or pancreatic lesion.  Left femoral neck fracture with fragmentation of the left femoral head and acetabulum.  This is new from the prior exam.  Subluxed/partially dislocated right femoral head with impaction against the posterior acetabulum.  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  Sacral decubiti with sclerotic appearance of the left ilium new  from prior examination.  Osteomyelitis is a possibility.  IMPRESSION:  Proximal right ureteral 1.3 cm obstructing stone with mild to moderate right-sided hydronephrosis.  Several nonobstructing right renal calculi noted.  Small nonobstructing left lower pole renal calculi.  There is fullness of the left renal collecting system.  No definitive left ureteral obstructing stone.  Fullness of the collecting systems may be partially explained by bladder outlet obstruction as the bladder is prominent in size. Calcification along the posterior aspect of the bladder wall may reflect changes of chronic distension/inflammation.  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  No CT evidence of pericholecystic inflammation.  Ultrasound can be obtained for further delineation if this is of clinical concern.  Circumferential thickening rectosigmoid region may be related to under distension.   Inflammation not excluded.  No free intraperitoneal air.  Left femoral neck fracture with fragmentation of the left femoral head and acetabulum.  This is new from the prior exam.  Subluxed/partially dislocated right femoral head with impaction against the posterior acetabulum.  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  Sacral decubiti with sclerotic appearance of the left ilium new from prior examination.  Osteomyelitis is a possibility.  Right lung base 3.7 mm nodule (series 4 image 7) not previously imaged. Follow up as noted above.   Original Report Authenticated By: Lacy Duverney, M.D.   US Renal  01/03/2013   *RADIOLOGY REPORT*  Clinical Data: Renal insufficiency  RENAL / URINARY TRACT ULTRASOUND  Technique:  Complete ultrasound exam of the kidneys and urinary bladder was performed.  Comparison: CT scan from 01/03/2013  Findings:  The right kidney measures 13.3  cm in long axis.  The left kidney measures 12.8 cm.  Mild to moderate left hydronephrosis is evident and while comparing across modalities is difficult, the degree of collecting system distention appears to be roughly similar to the previous CT scan. At least two stones in the 5-6 mm size range are seen in the left kidney towards the lower pole.  There is mild to moderate right-sided hydronephrosis with multiple right renal stones evident.  Images in the central anatomic pelvis reveal a distended bladder with posterior bladder wall thickening and intraluminal debris.  Incidental imaging of the gallbladder shows multiple gallstones.  Impression:  Bilateral mild to moderate hydronephrosis with bilateral renal stones.  There is seen to be some associated dilatation of the proximal right ureter although an etiology for the bilateral hydronephrosis is not evident on today's study.  The patient has multiple bilateral renal stones and the obstruction may be secondary to the stone disease.  Posterior bladder wall thickening with debris in  the urinary bladder.  Bladder infection would be a consideration.   Original Report Authenticated By: Kennith Center, M.D.   Dg Chest Portable 1 View  01/03/2013   *RADIOLOGY REPORT*  Clinical Data: Fever, dizziness and weakness.  PORTABLE CHEST - 1 VIEW  Comparison: Single view of the chest 10/12/2012 and 09/15/2012.  Findings: Again seen is right upper lobe scarring.  The lungs appear emphysematous.  No pneumothorax or pleural effusion is identified.  Heart size is normal.  IMPRESSION:  1.  No acute finding. 2.  Pulmonary hyperinflation and right upper lobe scarring, unchanged.   Original Report Authenticated By: Holley Dexter, M.D.    Anti-infectives: Anti-infectives   Start     Dose/Rate Route Frequency Ordered Stop   01/04/13 1000  vancomycin (VANCOCIN) IVPB 750 mg/150 ml premix  750 mg 150 mL/hr over 60 Minutes Intravenous Every 24 hours 01/03/13 1207     01/03/13 1600  piperacillin-tazobactam (ZOSYN) IVPB 3.375 g     3.375 g 12.5 mL/hr over 240 Minutes Intravenous Every 8 hours 01/03/13 1159     01/03/13 0800  piperacillin-tazobactam (ZOSYN) IVPB 4.5 g  Status:  Discontinued     4.5 g 200 mL/hr over 30 Minutes Intravenous  Once 01/03/13 0751 01/03/13 0754   01/03/13 0800  piperacillin-tazobactam (ZOSYN) IVPB 3.375 g     3.375 g 100 mL/hr over 30 Minutes Intravenous To Emergency Dept 01/03/13 0755 01/03/13 0852   01/03/13 0800  vancomycin (VANCOCIN) 1,250 mg in sodium chloride 0.9 % 250 mL IVPB     1,250 mg 166.7 mL/hr over 90 Minutes Intravenous STAT 01/03/13 0758 01/03/13 1109      Current Facility-Administered Medications  Medication Dose Route Frequency Provider Last Rate Last Dose  . 0.9 %  sodium chloride infusion  250 mL Intravenous PRN Jeanella Craze, NP      . 0.9 %  sodium chloride infusion   Intravenous Continuous Jeanella Craze, NP 125 mL/hr at 01/03/13 1111    . [START ON 01/04/2013] amantadine (SYMMETREL) capsule 100 mg  100 mg Oral QODAY Jeanella Craze, NP       . dantrolene (DANTRIUM) capsule 25 mg  25 mg Oral TID Jeanella Craze, NP      . feeding supplement (ENSURE COMPLETE) liquid 237 mL  237 mL Oral TID BM Jeanella Craze, NP      . gabapentin (NEURONTIN) capsule 600 mg  600 mg Oral QID Leslye Peer, MD      . heparin injection 5,000 Units  5,000 Units Subcutaneous Q8H Jeanella Craze, NP      . multivitamin with minerals tablet 1 tablet  1 tablet Oral Daily Jeanella Craze, NP      . piperacillin-tazobactam (ZOSYN) IVPB 3.375 g  3.375 g Intravenous Q8H Christine E Shade, RPH      . [START ON 01/04/2013] vancomycin (VANCOCIN) IVPB 750 mg/150 ml premix  750 mg Intravenous Q24H Christine E Shade, RPH       I have reviewed the CT films and [place] report and I have reviewed his recent labs and ER notes.   Procedure.  The penis was prepped with betadine and a 47fr coude foley catheter was placed to bedside bag drainage with return of several hundred cc of progressively more turbid, malodorous urine.  Assessment: Urosepsis with a neurogenic bladder with retention. Right ureteral stone with obstruction but the ureter is dilated below the stone and on the left as well so the retention is probably responsible for the hydro. Right renal stones.  Severely contracted lower extremities.   Plan: Leave foley to SD and consider suprapubic tube placement to aid management but the extremity contractures might complicate SP tube placement and will have to be more closely evaluated.   Right perc tube placement.  He will eventually need a right percutaneous nephrolithotomy for removal of the renal and ureteral stones.   CC: Dr. Levy Pupa.     LOS: 0 days    Bula Cavalieri J 01/03/2013

## 2013-01-03 NOTE — ED Notes (Signed)
Per EMS, pt states that he has been asleep for two days and has been feeling very lethargic. Pt also c/o chills, and sore throat. Pt has indwelling foley catheter in place and is bed bound. EMS noted bed sores are present. Family is requesting that the pillow case and white sheet sent with the pt please be returned with him.

## 2013-01-03 NOTE — Progress Notes (Signed)
[facility] ED Cm noted CM consult . CM consulted with Admission RN and spoke with Elease Hashimoto at pt's bedside about CM consult  Elease Hashimoto shared that the pt is being care for by Delorise Shiner who sleeps at his home but also has to work every day. Pt presently not alert to speak with Cm at this time. [facility] his July 2014 d/c home, therefore he has been out of snf for less than a month per Elease Hashimoto. Elease Hashimoto states Delorise Shiner should be the primary contact for primary care of the pt for [facility] medical staff.  Elease Hashimoto shares there are some concerns voiced about [facility] regarding to safety in the home.  Elease Hashimoto denies living environment issues but reports she believes pt needs 24 hr supervision.  Reports pt also has had the services of [facility].  Confirms pt with BCBS and medicaid coverage.  Agreed to allow CM to consult [facility], [facility] and [facility].  Discussed pt will be further evaluated by [facility] PT/OT for recommendations for level of care needs also [address] at [facility] states pt was d/c from their services on Oct 14 2012  [address] Bogus at [facility] states pt was d/c from [facility] services on December 30 2012, therefore pt is not being followed by a home health agency as of 12/30/12  Consult for [facility] ordered

## 2013-01-03 NOTE — ED Notes (Signed)
XR at bedside

## 2013-01-03 NOTE — ED Notes (Signed)
Patient transported to CT 

## 2013-01-03 NOTE — Progress Notes (Signed)
UR completed 

## 2013-01-03 NOTE — Procedures (Signed)
R perc nephrostomy No complication No blood loss. See complete dictation in [facility].

## 2013-01-03 NOTE — Consult Note (Signed)
WOC consult Note Reason for Consult:Suggestions for care of wounds per MD request.  Patient known to me from a previous encounter in May 2014.  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  Care is immensely complicated by lower limb contractures.  Has been seen by Plastics in the past, Dr. Wayland Denis, but I cannot determine from patient if he has seen her recently. No pressure ulcers on feet, other areas. Per Ortho MDs orders, he is not a candidate for operative services. Wound type: Pressure Ulcers as noted above. Pressure Ulcer POA: Yes Measurement:Right hip:  9cm x 13.5cm x 0.2cm with head of trochanter clearly evident, albeit covered with thin layer of tissue.  Slight increase in depth ( to 0.4cm) noted from 3 o'clock to 6 o'clock. Upper left buttock: 6cm x 4cm x 0.2cm with clean, punctate margins and smooth, non-granulating base.  Sacrum:  3cm x 2cm x 0.3cm with 60% red, 40% thin yellow slough in base. Wound bed: As described above. Drainage (amount, consistency, odor) moderate amount of thin yellow exudate on old dressings. Periwound:Intact with evidence of previous healing. Dressing procedure/placement/frequency: Until and if there are to be more aggressive measures directed toward wound healing (eg, Nutrition, wound bed preparation, etc.) The most appropriate regimen is conservative, that is saline dampened gauze changed twice daily and PRN soiling.  Patient has no formal bowel program and is incontinence of stool.  At present, he is on a therapeutic support surface with low air loss feature because he is in ICU.  When he transfers to the floor, he will require a mattress overlay with low air loss feature.  If you agree, please order upon transfer. WOC Nurses will not follow, but will remain available to this patient and to his medical and nursing teams.  Please re-consult if  needed. Thanks, Ladona Mow, MSN, RN, GNP, [place], CWON-AP [phone])

## 2013-01-03 NOTE — H&P (Signed)
PULMONARY  / CRITICAL CARE MEDICINE  Name: [name] Mccoy MRN: [id number] DOB: Oct 11, 1960    ADMISSION DATE:  01/03/2013  REFERRING MD :  EDP PRIMARY SERVICE: PCCM  CHIEF COMPLAINT:  Sepsis (Urosepsis)  BRIEF PATIENT DESCRIPTION: [name] with PMH of MS & pressure ulcers requiring debridement admitted 8/18 with urosepsis.  SIGNIFICANT EVENTS / STUDIES:  8/18 - Admit with uropsepsis  LINES / TUBES:   CULTURES: UC 8/18>>> BCx2 8/18>>>  ANTIBIOTICS: Vanco 8/18>>> Zosyn 8/18>>>  HISTORY OF PRESENT ILLNESS:  [name] with PMH of MS, pressure ulcers requiring debridement, frequent admits within previous 3 months for UTI's who presented to [facility] ER with caregiver reporting 2 days of chills that progressed to fevers / sweating, weakness / fatigue from rigors, altered mental status / hallucinations.  He also has had decreased PO intake and one day of diarrhea.  Caregiver reports pt was sent home in July from a SNF for rehab with a foley catheter.  It has been changed once monthly since.  She noted a change in the urine quality 1 wk prior to admit - darker with more sediment and that the urine had been leaking from the meatus despite the catheter.  He has a large right hip and sacral decubitus ulcer that are cared for by Dr. Norma Mccoy and his home caregiver.  No new noted odor or drainage from wounds.   ER work up noted a lactic acid of 2.6, WBC 11.1, fever of 104.9, mild anemia, clear cxr, urine positive for UTI and hypotension.  CT of abd with R obs urinary stone.  Patient & caregiver report his normal blood pressure is low (last visit at [facility] notes BP of 106/59).  He reports he has been worked up by Dr. Sandria Mccoy for hypotension & tachycardia.  Patient denies burning, urgency but has noted a "pressure" in his lower abdomen.     PAST MEDICAL HISTORY :  Past Medical History  Diagnosis Date  . Multiple sclerosis   . Pressure ulcer, buttock(707.05)   . Pain in joint, pelvic region and thigh    . Spasm of muscle   . Unspecified vitamin D deficiency   . Disturbance of skin sensation    Past Surgical History  Procedure Laterality Date  . Wound debridement Right 10/15/2012    Procedure: DEBRIDMENT OF RIGHT HIP DECUBITUS AND SACRAL DECUBITUS;  Surgeon: [name] Heckler, MD;  Location: [facility] ORS;  Service: General;  Laterality: Right;  left lateral position, right hip   Prior to Admission medications   Medication Sig Start Date End Date Taking? Authorizing Provider  amantadine (SYMMETREL) 100 MG capsule Take 100 mg by mouth 2 (two) times daily.    Yes Historical Provider, MD  collagenase (SANTYL) ointment Apply topically daily. Apply to Unstageable pressure ulcer on coccyx once daily.  Apply 1/8 inch thick and top with saline dressing.  Change daily and PRN for soiling or loosening of dressing x 21 days. 09/21/12  Yes [name] Etienne, MD  dantrolene (DANTRIUM) 25 MG capsule Take 25 mg by mouth 3 (three) times daily.   Yes Historical Provider, MD  ezetimibe (ZETIA) 10 MG tablet Take 10 mg by mouth daily.   Yes Historical Provider, MD  feeding supplement (ENSURE COMPLETE) LIQD Take 237 mLs by mouth 3 (three) times daily between meals. 09/21/12  Yes [name] Etienne, MD  gabapentin (NEURONTIN) 600 MG tablet Take 600 mg by mouth 4 (four) times daily.   Yes Historical Provider, MD  HYDROcodone-acetaminophen (NORCO/VICODIN) 5-325 MG per tablet Take 1 tablet every 8 hours as needed for pain(1-5) Take 2 tablets every 8 hours as needed for pain (6-10). 10/25/12  Yes [name] L Reed, DO  Interferon Beta-1b (BETASERON) 0.3 MG KIT injection Inject 0.25 mg into the skin every other day. 12/31/12  Yes [name] Marker, MD  modafinil (PROVIGIL) 200 MG tablet Take 200 mg by mouth 2 (two) times daily.    Yes Historical Provider, MD  Multiple Vitamin (MULTIVITAMIN WITH MINERALS) TABS Take 1 tablet by mouth daily. 09/21/12  Yes [name] Etienne, MD  nutrition supplement [employer]) PACK Take 1 packet by mouth 2 (two) times  daily after a meal. 09/21/12  Yes [name] Etienne, MD  tiZANidine (ZANAFLEX) 4 MG tablet Take 4 mg by mouth 4 (four) times daily - after meals and at bedtime.   Yes Historical Provider, MD   No Known Allergies  FAMILY HISTORY:  Family History  Problem Relation Age of Onset  . Hypertension Mother   . Dementia Mother   . Hypertension Father   . Heart disease Father    SOCIAL HISTORY:  reports that he has never smoked. He has never used smokeless tobacco. He reports that he does not drink alcohol or use illicit drugs.  REVIEW OF SYSTEMS:  See HPI for pertinent positives.  All systems reviewed and otherwise negative.    SUBJECTIVE:   VITAL SIGNS: Temp:  [104.9 F (40.5 C)] 104.9 F (40.5 C) (08/18 0711) Pulse Rate:  [137-162] 137 (08/18 0915) Resp:  [12-26] 24 (08/18 0900) BP: (76-110)/(46-69) 85/52 mmHg (08/18 0915) SpO2:  [95 %-97 %] 97 % (08/18 0915) Weight:  [134 lb (60.782 kg)] 134 lb (60.782 kg) (08/18 0928)  HEMODYNAMICS:    VENTILATOR SETTINGS:    INTAKE / OUTPUT: Intake/Output     08/17 0701 - 08/18 0700 08/18 0701 - 08/19 0700   Urine (mL/kg/hr)  550   Total Output   550   Net   -550          PHYSICAL EXAMINATION: General:  Chronically ill in NAD Neuro:  AAOx4, speech clear, MAE HEENT:  Mm pink/dry, good dentition, no JVD Cardiovascular:  s1s2 tachy, regular, no m/r/g Lungs:  resp's even/non-labored, lungs bilaterally clear Abdomen:  Flat/soft, bsx4 active Musculoskeletal:  No acute changes, chronic deformities associated with MS Skin:  Warm/dry  LABS:  CBC Recent Labs     01/03/13  0441  WBC  11.1*  HGB  9.8*  HCT  29.8*  PLT  360   Coag's Recent Labs     01/03/13  0441  APTT  37  INR  1.39   BMET Recent Labs     01/03/13  0441  NA  131*  K  4.5  CL  97  CO2  19  BUN  70*  CREATININE  3.62*  GLUCOSE  167*   Electrolytes Recent Labs     01/03/13  0441  CALCIUM  7.8*   Sepsis Markers No results found for this basename:  LACTICACIDVEN, PROCALCITON, O2SATVEN,  in the last 72 hours  ABG No results found for this basename: PHART, PCO2ART, PO2ART,  in the last 72 hours  Liver Enzymes Recent Labs     01/03/13  0441  AST  28  ALT  21  ALKPHOS  106  BILITOT  0.4  ALBUMIN  2.2*   Cardiac Enzymes Recent Labs     01/03/13  0441  TROPONINI  <0.30   Glucose  No results found for this basename: GLUCAP,  in the last 72 hours  Imaging Ct Abdomen Pelvis Wo Contrast  01/03/2013   *RADIOLOGY REPORT*  Clinical Data: Generalized weakness.  Question UTI.  Lethargic. Bed sores.  Sore throat.  Multiple sclerosis.  CT ABDOMEN AND PELVIS WITHOUT CONTRAST  Technique:  Multidetector CT imaging of the abdomen and pelvis was performed following the standard protocol without intravenous contrast.  Comparison: 07/28/2011.  Findings: Proximal right ureteral 1.3 cm obstructing stone with mild to moderate right-sided hydronephrosis.  Several nonobstructing right renal calculi noted.  Small nonobstructing left lower pole renal calculi.  There is fullness of the left renal collecting system.  No definitive left ureteral obstructing stone.  Fullness of the collecting systems may be partially explained by bladder outlet obstruction as the bladder is prominent in size. Calcification along the posterior aspect of the bladder wall may reflect changes of chronic distension/inflammation.  Right lung base 3.7 mm nodule (series 4 image 7) not previously imaged. Given risk factors for bronchogenic carcinoma, follow-up chest CT at 1 year is recommended.  This recommendation follows the consensus statement:  Guidelines for Management of Small Pulmonary Nodules Detected on CT Scans:  A Statement from the Fleischner Society as published in Radiology 2005; 237:395-400.  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  No CT evidence of pericholecystic inflammation.  Ultrasound can be obtained  for further delineation if this is of clinical concern.  Circumferential thickening rectosigmoid region may be related to under distension.  Inflammation not excluded.  No free intraperitoneal air.  Evaluation of solid abdominal viscera is limited by lack of IV contrast.  Taking this limitation into account:  No worrisome hepatic, splenic, adrenal or pancreatic lesion.  Left femoral neck fracture with fragmentation of the left femoral head and acetabulum.  This is new from the prior exam.  Subluxed/partially dislocated right femoral head with impaction against the posterior acetabulum.  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  Sacral decubiti with sclerotic appearance of the left ilium new from prior examination.  Osteomyelitis is a possibility.  IMPRESSION:  Proximal right ureteral 1.3 cm obstructing stone with mild to moderate right-sided hydronephrosis.  Several nonobstructing right renal calculi noted.  Small nonobstructing left lower pole renal calculi.  There is fullness of the left renal collecting system.  No definitive left ureteral obstructing stone.  Fullness of the collecting systems may be partially explained by bladder outlet obstruction as the bladder is prominent in size. Calcification along the posterior aspect of the bladder wall may reflect changes of chronic distension/inflammation.  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  No CT evidence of pericholecystic inflammation.  Ultrasound can be obtained for further delineation if this is of clinical concern.  Circumferential thickening rectosigmoid region may be related to under distension.  Inflammation not excluded.  No free intraperitoneal air.  Left femoral neck fracture with fragmentation of the left femoral head and acetabulum.  This is new from the prior exam.  Subluxed/partially dislocated right femoral head with impaction against the posterior  acetabulum.  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  Sacral decubiti with sclerotic appearance of the left ilium new from prior examination.  Osteomyelitis is a possibility.  Right lung base 3.7 mm nodule (series 4 image 7) not previously imaged. Follow up as noted above.   Original Report Authenticated By: Lacy Duverney, M.D.   Dg Chest Portable 1 View  01/03/2013   *RADIOLOGY REPORT*  Clinical Data: Fever, dizziness and weakness.  PORTABLE CHEST - 1 VIEW  Comparison: Single view of the chest 10/12/2012 and 09/15/2012.  Findings: Again seen is right upper lobe scarring.  The lungs appear emphysematous.  No pneumothorax or pleural effusion is identified.  Heart size is normal.  IMPRESSION:  1.  No acute finding. 2.  Pulmonary hyperinflation and right upper lobe scarring, unchanged.   Original Report Authenticated By: Holley Dexter, M.D.    ASSESSMENT / PLAN:  RENAL A:   Right Obstructing Renal Calculi - 1.3 cm R stone with mild to moderate hydronephrosis on CT.  Multiple small non-obs stones as well.  Acute Kidney Injury - in setting of obstructing renal calculi Hyponatremia ? pyleo  Enlarged, ? Inflamed prostate on CT scan  P:   -unable to place catheter in ER, inserted without difficulty to hub but balloon would not inflate (checked balloon once removed and would inflate).  Did have urine returned. -Urology consult regarding catheter placement / obstructing stones - IR consult for placement of percutaneous nephrostomy drain - condom cath for now - abx as below   PULMONARY A: Pulmonary Nodule - incidental finding on CT ABD P:   -will need outpatient follow up for surveillance of nodule  CARDIOVASCULAR A:  Hypotension / Tachycardia - in setting of sepsis, ? Component of autonomic dysfunction with MS.  Troponin negative  Sepsis  P:  -admit ICU for close observation  -repeat lactic acid to ensure clearance -will accept lower BP reading with good mental status,  may need placement of TLC if decompensation -See ID   GASTROINTESTINAL A:   Diarrhea  Protein Calorie Malnutrition P:   -See ID  HEMATOLOGIC A:   Anemia - microcytic, likely of chronic disease P:  -monitor H/H with hydration  -no evidence of acute bleeding -tx for Hgb <7%, active bleeding or MI <8%  INFECTIOUS A:   UTI, probable R pyelo ? Prostatitis based on abd CT scan 8/18 Diarrhea - small volume Sacral Decubitus, R hip Decubitus  - large open wounds with clean wound beds, no odor or drainage ? Sacral Osteo - concern noted on CT P:   -abx as above -blood & urine cultures as above -WOC for decubitus, ? Need further eval for osteo -assess lactate, PCT, cortisol -if persistent diarrhea, consider culture / PCR  ENDOCRINE A:   Hyperglycemia   P:   -follow glucose on BMP, if persistently elevated, add sensitive SSI  NEUROLOGIC A:   Multiple Sclerosis  P:   -continue home medications  ORTHO A:  L Femoral Neck Fracture - new fx's noted on CT ABD compared to previous film in 07/2011 Subluxed / Partially dislocated R Femoral Head with Impaction & R fem head avascular necrosis with mild collapse - on / off high dose steroids for past 30 years secondary to MS P:  -Discussed with Dr. Magnus Ivan regarding treatment options - given patients non-ambulatory state with chronic wounds/ contractures, he would not likely be a candidate for surgical options.  Recommend medical management of pain.  I have personally obtained a history, examined the patient, evaluated laboratory and imaging results, formulated the assessment and plan and placed orders.   CRITICAL CARE: The patient is critically ill with multiple organ systems failure and requires high complexity decision making for assessment and support, frequent evaluation and titration of therapies, application of advanced monitoring technologies and extensive interpretation of multiple databases. Critical Care Time devoted to  patient care  services described in this note is 60 minutes.    Levy Pupa, MD, PhD 01/03/2013, 12:09 PM [facility] [phone] or if no answer [phone]

## 2013-01-03 NOTE — Progress Notes (Signed)
ANTIBIOTIC CONSULT NOTE - INITIAL  Pharmacy Consult for Vancomycin, Zosyn Indication: Sepsis  No Known Allergies  Patient Measurements: Height: 6\' 3"  (190.5 cm) Weight: 134 lb (60.782 kg) IBW/kg (Calculated) : 84.5  Vital Signs: Temp: 104.9 F (40.5 C) (08/18 0711) Temp src: Rectal (08/18 0711) BP: 82/49 mmHg (08/18 0900) Pulse Rate: 138 (08/18 0900)  Labs:  Recent Labs  01/03/13 0441  WBC 11.1*  HGB 9.8*  PLT 360  CREATININE 3.62*   Estimated Creatinine Clearance: 21 ml/min (by C-G formula based on Cr of 3.62).  Microbiology: No results found for this or any previous visit (from the past 720 hour(s)).  Medical History: Past Medical History  Diagnosis Date  . Multiple sclerosis   . Pressure ulcer, buttock(707.05)   . Pain in joint, pelvic region and thigh   . Spasm of muscle   . Unspecified vitamin D deficiency   . Disturbance of skin sensation     Medications:  Anti-infectives   Start     Dose/Rate Route Frequency Ordered Stop   01/03/13 0800  piperacillin-tazobactam (ZOSYN) IVPB 4.5 g  Status:  Discontinued     4.5 g 200 mL/hr over 30 Minutes Intravenous  Once 01/03/13 0751 01/03/13 0754   01/03/13 0800  piperacillin-tazobactam (ZOSYN) IVPB 3.375 g     3.375 g 100 mL/hr over 30 Minutes Intravenous To Emergency Dept 01/03/13 0755 01/03/13 0852   01/03/13 0800  vancomycin (VANCOCIN) 1,250 mg in sodium chloride 0.9 % 250 mL IVPB     1,250 mg 166.7 mL/hr over 90 Minutes Intravenous STAT 01/03/13 0758 01/04/13 0800     Assessment: 52 yoM admitted 8/18 with r/o sepsis; hx of weakness, lethargy and confusion started 2 days ago per caregiver.  Hx of UTIs in the past (UA: large leukocytes, +WBC, many bacteria).  CXR without acute changes.  Pharmacy asked to dose vancomycin and zosyn.  First doses of Vanc 1250mg  IV and Zosyn 3.375mg  IV sent to ED.  SCr acutely elevated (3.62) with CrCl ~ 21 ml/min (CG)  WBC 11.1  Tm 104.9  Goal of Therapy:  Vancomycin  trough level 15-20 mcg/ml  Plan:   Vancomycin 750mg  IV q24h, next dose due 01/04/13. Zosyn 3.375g IV Q8H infused over 4hrs.   Measure Vanc trough at steady state.  Follow up renal fxn and culture results.  Lynann Beaver PharmD, BCPS Pager [phone] 01/03/2013 12:08 PM

## 2013-01-03 NOTE — Interval H&P Note (Cosign Needed)
History and Physical Interval Note:  01/03/2013 1:13 PM  [name] Mccoy  has presented today for surgery, with the diagnosis of Urosepsis. IR is asked to place a right sided nephrosotmy tube for hydronephrosis from an obstructing right renal stone.  The various methods of treatment have been discussed with the patient and family. After consideration of risks, benefits and other options for treatment, the patient has consented to right percutaneous nephrostomy tube placement. The patient's history has been reviewed, patient examined, no change in status, stable for procedure.  I have reviewed the patient's chart and labs.  Questions were answered to the patient's satisfaction.   BP 72/45  Pulse 129  Temp(Src) 101.4 F (38.6 C) (Rectal)  Resp 20  Ht 6\' 3"  (1.905 m)  Wt 143 lb 4.8 oz (65 kg)  BMI 17.91 kg/m2  SpO2 95% ENT: airway clear Lungs: CTA, no wheezes. Heart: Tachy regular. Ext: Severe hip and LE contractures related to MS.  Results for orders placed during the hospital encounter of 01/03/13  CBC      Result Value Range   WBC 11.1 (*) 4.0 - 10.5 K/uL   RBC 3.99 (*) 4.22 - 5.81 MIL/uL   Hemoglobin 9.8 (*) 13.0 - 17.0 g/dL   HCT 95.2 (*) 84.1 - 32.4 %   MCV 74.7 (*) 78.0 - 100.0 fL   MCH 24.6 (*) 26.0 - 34.0 pg   MCHC 32.9  30.0 - 36.0 g/dL   RDW 40.1 (*) 02.7 - 25.3 %   Platelets 360  150 - 400 K/uL  COMPREHENSIVE METABOLIC PANEL      Result Value Range   Sodium 131 (*) 135 - 145 mEq/L   Potassium 4.5  3.5 - 5.1 mEq/L   Chloride 97  96 - 112 mEq/L   CO2 19  19 - 32 mEq/L   Glucose, Bld 167 (*) 70 - 99 mg/dL   BUN 70 (*) 6 - 23 mg/dL   Creatinine, Ser 6.64 (*) 0.50 - 1.35 mg/dL   Calcium 7.8 (*) 8.4 - 10.5 mg/dL   Total Protein 7.7  6.0 - 8.3 g/dL   Albumin 2.2 (*) 3.5 - 5.2 g/dL   AST 28  0 - 37 U/L   ALT 21  0 - 53 U/L   Alkaline Phosphatase 106  39 - 117 U/L   Total Bilirubin 0.4  0.3 - 1.2 mg/dL   GFR calc non Af Amer 18 (*) >90 mL/min   GFR calc Af Amer 21 (*)  >90 mL/min  LACTIC ACID, PLASMA      Result Value Range   Lactic Acid, Venous 2.6 (*) 0.5 - 2.2 mmol/L  URINALYSIS, ROUTINE W REFLEX MICROSCOPIC      Result Value Range   Color, Urine YELLOW  YELLOW   APPearance TURBID (*) CLEAR   Specific Gravity, Urine 1.015  1.005 - 1.030   pH 7.0  5.0 - 8.0   Glucose, UA NEGATIVE  NEGATIVE mg/dL   Hgb urine dipstick MODERATE (*) NEGATIVE   Bilirubin Urine NEGATIVE  NEGATIVE   Ketones, ur NEGATIVE  NEGATIVE mg/dL   Protein, ur 30 (*) NEGATIVE mg/dL   Urobilinogen, UA 0.2  0.0 - 1.0 mg/dL   Nitrite NEGATIVE  NEGATIVE   Leukocytes, UA LARGE (*) NEGATIVE  CORTISOL      Result Value Range   Cortisol, Plasma 67.2    TROPONIN I      Result Value Range   Troponin I <0.30  <0.30 ng/mL  PROTIME-INR  Result Value Range   Prothrombin Time 16.7 (*) 11.6 - 15.2 seconds   INR 1.39  0.00 - 1.49  APTT      Result Value Range   aPTT 37  24 - 37 seconds  FIBRINOGEN      Result Value Range   Fibrinogen 772 (*) 204 - 475 mg/dL  URINE MICROSCOPIC-ADD ON      Result Value Range   Squamous Epithelial / LPF RARE  RARE   WBC, UA TOO NUMEROUS TO COUNT  <3 WBC/hpf   RBC / HPF 7-10  <3 RBC/hpf   Bacteria, UA MANY (*) RARE  LACTIC ACID, PLASMA      Result Value Range   Lactic Acid, Venous 1.5  0.5 - 2.2 mmol/L  TYPE AND SCREEN      Result Value Range   ABO/RH(D) A POS     Antibody Screen NEG     Sample Expiration 01/06/2013      Seen for and discussed with Dr. Deanne Coffer. Brayton El PA-C Interventional Radiology 01/03/2013 1:17 PM

## 2013-01-03 NOTE — Progress Notes (Signed)
INITIAL NUTRITION ASSESSMENT  DOCUMENTATION CODES Per approved criteria  -Underweight   INTERVENTION: Diet advancement per MD discretion Continue Ensure Complete TID Provide Juven BID Continue Multivitamin with minerals daily  NUTRITION DIAGNOSIS: Increased nutrient needs related to underweight and wound healing as evidenced by BMI of 17.9 and right hip and sacral pressure ulcers.   Goal: Pt to meet >/= 90% of their estimated nutrition needs  Monitor:  Diet advancement/ PO intake Weight Labs Wounds  Reason for Assessment: Consult  52 y.o. male  Admitting Dx: Septic Shock  ASSESSMENT: [name] with PMH of MS, pressure ulcers requiring debridement, frequent admits within previous 3 months for UTI's who presented to [facility] ER with caregiver reporting 2 days of chills that progressed to fevers / sweating, weakness / fatigue from rigors, altered mental status / hallucinations. He also has had decreased PO intake and one day of diarrhea. Pt asleep at time of first visit and down for procedure at time of second visit. RD familiar with pt from previous admissions. Pt usually weighs between 140 and 150 lbs. Pt usually eats 2 meals per day and drinks 2-3 Ensure supplements daily. Per H&P pt was also drinking Juven BID  PTA. Per H&P pt has sacral and R hip decubitus ulcers- large open wounds with clean wound beds, no odor or drainage. Per weight history pt's weight has been stable the past few months.   Height: Ht Readings from Last 1 Encounters:  01/03/13 6\' 3"  (1.905 m)    Weight: Wt Readings from Last 1 Encounters:  01/03/13 143 lb 4.8 oz (65 kg)    Ideal Body Weight: 196 lbs  % Ideal Body Weight: 73%  Wt Readings from Last 10 Encounters:  01/03/13 143 lb 4.8 oz (65 kg)  11/09/12 134 lb (60.782 kg)  10/14/12 139 lb 5.3 oz (63.2 kg)  10/14/12 139 lb 5.3 oz (63.2 kg)  09/17/12 136 lb 11 oz (62 kg)  07/26/11 141 lb 12.1 oz (64.3 kg)    Usual Body Weight: 140-150 lbs  %  Usual Body Weight: 100%  BMI:  Body mass index is 17.91 kg/(m^2).  Estimated Nutritional Needs: Kcal: 2000-2200 Protein: 100-110 grams Fluid: 2-2.2 L/day  Skin: sacral decubitus and R hip decubitus per H&P note  Diet Order: NPO  EDUCATION NEEDS: -No education needs identified at this time   Intake/Output Summary (Last 24 hours) at 01/03/13 1518 Last data filed at 01/03/13 1300  Gross per 24 hour  Intake 1102.08 ml  Output    550 ml  Net 552.08 ml    Last BM: PTA   Labs:   Recent Labs Lab 01/03/13 0441  NA 131*  K 4.5  CL 97  CO2 19  BUN 70*  CREATININE 3.62*  CALCIUM 7.8*  GLUCOSE 167*    CBG (last 3)  No results found for this basename: GLUCAP,  in the last 72 hours  Scheduled Meds: . [START ON 01/04/2013] amantadine  100 mg Oral QODAY  . dantrolene  25 mg Oral TID  . feeding supplement  237 mL Oral TID BM  . fentaNYL      . gabapentin  600 mg Oral QID  . heparin  5,000 Units Subcutaneous Q8H  . lidocaine      . midazolam      . multivitamin with minerals  1 tablet Oral Daily  . piperacillin-tazobactam (ZOSYN)  IV  3.375 g Intravenous Q8H  . [START ON 01/04/2013] vancomycin  750 mg Intravenous Q24H  Continuous Infusions: . sodium chloride 125 mL/hr at 01/03/13 1111  . phenylephrine (NEO-SYNEPHRINE) Adult infusion 20 mcg/min (01/03/13 1421)    Past Medical History  Diagnosis Date  . Multiple sclerosis   . Pressure ulcer, buttock(707.05)   . Pain in joint, pelvic region and thigh   . Spasm of muscle   . Unspecified vitamin D deficiency   . Disturbance of skin sensation   . Right ureteral stone 01/03/13  . Renal stones   . Neurogenic bladder   . Urinary retention with incomplete bladder emptying     Past Surgical History  Procedure Laterality Date  . Wound debridement Right 10/15/2012    Procedure: DEBRIDMENT OF RIGHT HIP DECUBITUS AND SACRAL DECUBITUS;  Surgeon: Velora Heckler, MD;  Location: [facility];  Service: General;  Laterality: Right;   left lateral position, right hip    Ian Malkin RD, LDN Inpatient Clinical Dietitian Pager: [phone] After Hours Pager: [phone]

## 2013-01-04 DIAGNOSIS — N201 Calculus of ureter: Secondary | ICD-10-CM

## 2013-01-04 LAB — PROCALCITONIN: Procalcitonin: >175 ng/mL

## 2013-01-04 LAB — CBC
HCT: 27.5 % — ABNORMAL LOW (ref 39.0–52.0)
MCHC: 32.4 g/dL (ref 30.0–36.0)
Platelets: 287 10*3/uL (ref 150–400)
RDW: 16.3 % — ABNORMAL HIGH (ref 11.5–15.5)
WBC: 21.3 10*3/uL — ABNORMAL HIGH (ref 4.0–10.5)

## 2013-01-04 LAB — BASIC METABOLIC PANEL
BUN: 39 mg/dL — ABNORMAL HIGH (ref 6–23)
Calcium: 8.1 mg/dL — ABNORMAL LOW (ref 8.4–10.5)
Creatinine, Ser: 1.21 mg/dL (ref 0.50–1.35)
GFR calc non Af Amer: 68 mL/min — ABNORMAL LOW (ref >90)
Glucose, Bld: 169 mg/dL — ABNORMAL HIGH (ref 70–99)

## 2013-01-04 LAB — URINE CULTURE: Colony Count: >=100000

## 2013-01-04 MED ORDER — HYDROCODONE-ACETAMINOPHEN 5-325 MG PO TABS
1.0000 | ORAL_TABLET | Freq: Four times a day (QID) | ORAL | Status: DC | PRN
  Administered 2013-01-04: 1 via ORAL
  Filled 2013-01-04: qty 1

## 2013-01-04 MED ORDER — ACETAMINOPHEN 160 MG/5ML PO SOLN
650.0000 mg | Freq: Four times a day (QID) | ORAL | Status: DC | PRN
  Administered 2013-01-04 – 2013-01-06 (×3): 650 mg
  Filled 2013-01-04 (×3): qty 20.3

## 2013-01-04 MED ORDER — HYDROCODONE-ACETAMINOPHEN 5-325 MG PO TABS
1.0000 | ORAL_TABLET | Freq: Three times a day (TID) | ORAL | Status: DC | PRN
  Administered 2013-01-04: 1 via ORAL
  Administered 2013-01-05: 2 via ORAL
  Administered 2013-01-05: 1 via ORAL
  Administered 2013-01-06: 2 via ORAL
  Administered 2013-01-06: 1 via ORAL
  Administered 2013-01-07 – 2013-01-08 (×3): 2 via ORAL
  Filled 2013-01-04 (×4): qty 2
  Filled 2013-01-04 (×3): qty 1
  Filled 2013-01-04: qty 2

## 2013-01-04 MED ORDER — ONDANSETRON HCL 4 MG/2ML IJ SOLN
4.0000 mg | Freq: Four times a day (QID) | INTRAMUSCULAR | Status: DC | PRN
  Administered 2013-01-04: 4 mg via INTRAVENOUS
  Filled 2013-01-04: qty 2

## 2013-01-04 MED ORDER — AMANTADINE HCL 100 MG PO CAPS
100.0000 mg | ORAL_CAPSULE | Freq: Two times a day (BID) | ORAL | Status: DC
  Administered 2013-01-04 – 2013-01-08 (×9): 100 mg via ORAL
  Filled 2013-01-04 (×10): qty 1

## 2013-01-04 MED ORDER — POTASSIUM CHLORIDE CRYS ER 20 MEQ PO TBCR
40.0000 meq | EXTENDED_RELEASE_TABLET | Freq: Three times a day (TID) | ORAL | Status: AC
  Administered 2013-01-04 (×2): 40 meq via ORAL
  Filled 2013-01-04 (×2): qty 1
  Filled 2013-01-04: qty 2

## 2013-01-04 MED ORDER — VANCOMYCIN HCL 500 MG IV SOLR
500.0000 mg | Freq: Two times a day (BID) | INTRAVENOUS | Status: DC
  Filled 2013-01-04: qty 500

## 2013-01-04 MED ORDER — TIZANIDINE HCL 4 MG PO TABS
4.0000 mg | ORAL_TABLET | Freq: Three times a day (TID) | ORAL | Status: DC
  Administered 2013-01-04 – 2013-01-08 (×15): 4 mg via ORAL
  Filled 2013-01-04 (×20): qty 1

## 2013-01-04 MED ORDER — MENTHOL 3 MG MT LOZG
1.0000 | LOZENGE | OROMUCOSAL | Status: DC | PRN
  Administered 2013-01-04: 3 mg via ORAL
  Filled 2013-01-04: qty 9

## 2013-01-04 NOTE — Progress Notes (Signed)
Clinical Social Work Department BRIEF PSYCHOSOCIAL ASSESSMENT 01/04/2013  Patient:  [name] Mccoy, [name] Mccoy     Account Number:  [id number]     Admit date:  01/03/2013  Clinical Social Worker:  Jacelyn Grip  Date/Time:  01/04/2013 02:00 PM  Referred by:  Physician  Date Referred:  01/04/2013 Referred for  SNF Placement   Other Referral:   Interview type:  Patient Other interview type:    PSYCHOSOCIAL DATA Living Status:  OTHER Admitted from facility:   Level of care:   Primary support name:  Delorise Shiner Anderson/caregiver Primary support relationship to patient:  FRIEND Degree of support available:   pt lives alone with caregiver    CURRENT CONCERNS Current Concerns  Post-Acute Placement   Other Concerns:    SOCIAL WORK ASSESSMENT / PLAN CSW received referral that pt [facility] and has been in another facility as well in the past and may need [facility] at d/c.    CSW met with pt at bedside to discuss. CSW introduced self and explained role. Pt reports that he lives alone, but has a caregiver that is with him mostly 24 hours a day. CSW explored with pt about the potential for rehab at SNF again and pt does not want placement at SNF at this time. Pt reports that he plans to return home with private caregiver and home health services. Pt reports that pt caregiver is aware of pt plan to return home and will continue to be able to provide care for pt. Pt did not agree for CSW to contact anyone re: plan of care.    CSW left message with RNCM and CSW plans to discuss with RNCM that pt would like to return home with [facility] services.    CSW to continue to follow to provide support and assist with d/c planning as appropriate   Assessment/plan status:  Psychosocial Support/Ongoing Assessment of Needs Other assessment/ plan:   discharge planning   Information/referral to community resources:   Referral to [facility] for [facility] needs.    PATIENT'S/FAMILY'S RESPONSE TO PLAN OF  CARE: Pt alert and oriented x 4. Pt states that he plans to return home with caregiver and [facility] services. Pt very engaged in conversation and feels that his care can continued to be managed at home at time of discharge.     Jacklynn Lewis, MSW, LCSWA  Clinical Social Work [phone]

## 2013-01-04 NOTE — Progress Notes (Signed)
Patient ID: [name] Mccoy, male   DOB: Aug 07, 1960, 52 y.o.   MRN: [id number]    Subjective: Mr. [name] is feeling better today and is clinically improving.   He had a right perc placed last night and on nephrostogram with proximal ureteral stone is seen.   His foley is draining well and the urine is clear.   He has no specific complaints.  ROS: Negative except as above and he does report some right flank pain with deep inspiration.    Objective: Vital signs in last 24 hours: Temp:  [97.5 F (36.4 C)-98.1 F (36.7 C)] 98.1 F (36.7 C) (08/19 0800) Pulse Rate:  [84-118] 103 (08/19 1100) Resp:  [10-23] 13 (08/19 1100) BP: (72-108)/(43-72) 100/63 mmHg (08/19 1000) SpO2:  [91 %-100 %] 100 % (08/19 1100) Weight:  [65 kg (143 lb 4.8 oz)] 65 kg (143 lb 4.8 oz) (08/18 1207)  Intake/Output from previous day: 08/18 0701 - 08/19 0700 In: 4407.1 [I.V.:3307.1; IV Piggyback:1100] Out: 5150 [Urine:5150] Intake/Output this shift: Total I/O In: 650 [I.V.:625; IV Piggyback:25] Out: 450 [Urine:450]  General appearance: alert and no distress The right NT and foley are both draining clear urine.    I have reviewed his labs and the IR films and report.   His Cr is improving nicely.    Lab Results:   Recent Labs  01/03/13 0441 01/04/13 0530  WBC 11.1* 21.3*  HGB 9.8* 8.9*  HCT 29.8* 27.5*  PLT 360 287   BMET  Recent Labs  01/03/13 0441 01/04/13 0530  NA 131* 146*  K 4.5 2.5*  CL 97 115*  CO2 19 18*  GLUCOSE 167* 113*  BUN 70* 42*  CREATININE 3.62* 1.51*  CALCIUM 7.8* 7.9*   PT/INR  Recent Labs  01/03/13 0441  LABPROT 16.7*  INR 1.39   ABG No results found for this basename: PHART, PCO2, PO2, HCO3,  in the last 72 hours  Studies/Results: Ct Abdomen Pelvis Wo Contrast  01/03/2013   *RADIOLOGY REPORT*  Clinical Data: Generalized weakness.  Question UTI.  Lethargic. Bed sores.  Sore throat.  Multiple sclerosis.  CT ABDOMEN AND PELVIS WITHOUT CONTRAST  Technique:   Multidetector CT imaging of the abdomen and pelvis was performed following the standard protocol without intravenous contrast.  Comparison: 07/28/2011.  Findings: Proximal right ureteral 1.3 cm obstructing stone with mild to moderate right-sided hydronephrosis.  Several nonobstructing right renal calculi noted.  Small nonobstructing left lower pole renal calculi.  There is fullness of the left renal collecting system.  No definitive left ureteral obstructing stone.  Fullness of the collecting systems may be partially explained by bladder outlet obstruction as the bladder is prominent in size. Calcification along the posterior aspect of the bladder wall may reflect changes of chronic distension/inflammation.  Right lung base 3.7 mm nodule (series 4 image 7) not previously imaged. Given risk factors for bronchogenic carcinoma, follow-up chest CT at 1 year is recommended.  This recommendation follows the consensus statement:  Guidelines for Management of Small Pulmonary Nodules Detected on CT Scans:  A Statement from the Fleischner Society as published in Radiology 2005; 237:395-400.  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  No CT evidence of pericholecystic inflammation.  Ultrasound can be obtained for further delineation if this is of clinical concern.  Circumferential thickening rectosigmoid region may be related to under distension.  Inflammation not excluded.  No free intraperitoneal air.  Evaluation of solid abdominal viscera is limited by lack  of IV contrast.  Taking this limitation into account:  No worrisome hepatic, splenic, adrenal or pancreatic lesion.  Left femoral neck fracture with fragmentation of the left femoral head and acetabulum.  This is new from the prior exam.  Subluxed/partially dislocated right femoral head with impaction against the posterior acetabulum.  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  Sacral  decubiti with sclerotic appearance of the left ilium new from prior examination.  Osteomyelitis is a possibility.  IMPRESSION:  Proximal right ureteral 1.3 cm obstructing stone with mild to moderate right-sided hydronephrosis.  Several nonobstructing right renal calculi noted.  Small nonobstructing left lower pole renal calculi.  There is fullness of the left renal collecting system.  No definitive left ureteral obstructing stone.  Fullness of the collecting systems may be partially explained by bladder outlet obstruction as the bladder is prominent in size. Calcification along the posterior aspect of the bladder wall may reflect changes of chronic distension/inflammation.  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  No CT evidence of pericholecystic inflammation.  Ultrasound can be obtained for further delineation if this is of clinical concern.  Circumferential thickening rectosigmoid region may be related to under distension.  Inflammation not excluded.  No free intraperitoneal air.  Left femoral neck fracture with fragmentation of the left femoral head and acetabulum.  This is new from the prior exam.  Subluxed/partially dislocated right femoral head with impaction against the posterior acetabulum.  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  Sacral decubiti with sclerotic appearance of the left ilium new from prior examination.  Osteomyelitis is a possibility.  Right lung base 3.7 mm nodule (series 4 image 7) not previously imaged. Follow up as noted above.   Original Report Authenticated By: Lacy Duverney, M.D.   US Renal  01/03/2013   *RADIOLOGY REPORT*  Clinical Data: Renal insufficiency  RENAL / URINARY TRACT ULTRASOUND  Technique:  Complete ultrasound exam of the kidneys and urinary bladder was performed.  Comparison: CT scan from 01/03/2013  Findings:  The right kidney measures 13.3  cm in long axis.  The left kidney  measures 12.8 cm.  Mild to moderate left hydronephrosis is evident and while comparing across modalities is difficult, the degree of collecting system distention appears to be roughly similar to the previous CT scan. At least two stones in the 5-6 mm size range are seen in the left kidney towards the lower pole.  There is mild to moderate right-sided hydronephrosis with multiple right renal stones evident.  Images in the central anatomic pelvis reveal a distended bladder with posterior bladder wall thickening and intraluminal debris.  Incidental imaging of the gallbladder shows multiple gallstones.  Impression:  Bilateral mild to moderate hydronephrosis with bilateral renal stones.  There is seen to be some associated dilatation of the proximal right ureter although an etiology for the bilateral hydronephrosis is not evident on today's study.  The patient has multiple bilateral renal stones and the obstruction may be secondary to the stone disease.  Posterior bladder wall thickening with debris in the urinary bladder.  Bladder infection would be a consideration.   Original Report Authenticated By: Kennith Center, M.D.   Ir Perc Nephrostomy Right  01/03/2013   *RADIOLOGY REPORT*  Clinical Data:  Urosepsis.  Obstructing right proximal ureteral calculus with hydronephrosis.  RIGHT PERCUTANEOUS NEPHROSTOMY CATHETER PLACEMENT UNDER ULTRASOUND AND FLUOROSCOPIC GUIDANCE  Technique: The procedure, risks (including but not limited to bleeding, infection, organ damage), benefits,  and alternatives were explained to the patient.  Questions regarding the procedure were encouraged and answered.  The patient understands and consents to the procedure. The rightflank region prepped with Betadine, draped in usual sterile fashion, infiltrated locally with 1% lidocaine.The patient was receiving adequate antibiotic prophylactic coverage.  Intravenous Fentanyl and Versed were administered as conscious sedation during continuous  cardiorespiratory monitoring by the radiology RN, with a total moderate sedation time of in less than 30 minutes.   Under real-time ultrasound guidance, a 21-gauge trocar needle was advanced into a posterior lower pole calyx. Ultrasound image documentation was saved. No definite to urine could be aspirated. The collecting system was not significantly hydronephrotic. Therefore, under fluoroscopy a 21-gauge micropuncture needle was advanced towards a posterior lower pole radiodense calculus.  A 018 guide wire advanced easily within the renal collecting system. Needle was exchanged over  guidewire for transitional dilator. Contrast injection confirmed appropriate positioning. Catheter was exchanged over a guidewire for a 10 French pigtail catheter, formed centrally within the right renal collecting system. Contrast injection confirms appropriate positioning and patency. Catheter secured externally with 0 Prolene suture and placed to external drain bag. No immediate complication.  Fluoroscopy time: 2 minutes 6seconds  IMPRESSION Technically successful right percutaneous nephrostomy catheter placement.   Original Report Authenticated By: D. Andria Rhein, MD   Dg Chest Portable 1 View  01/03/2013   *RADIOLOGY REPORT*  Clinical Data: Fever, dizziness and weakness.  PORTABLE CHEST - 1 VIEW  Comparison: Single view of the chest 10/12/2012 and 09/15/2012.  Findings: Again seen is right upper lobe scarring.  The lungs appear emphysematous.  No pneumothorax or pleural effusion is identified.  Heart size is normal.  IMPRESSION:  1.  No acute finding. 2.  Pulmonary hyperinflation and right upper lobe scarring, unchanged.   Original Report Authenticated By: Holley Dexter, M.D.    Anti-infectives: Anti-infectives   Start     Dose/Rate Route Frequency Ordered Stop   01/04/13 1000  vancomycin (VANCOCIN) IVPB 750 mg/150 ml premix  Status:  Discontinued     750 mg 150 mL/hr over 60 Minutes Intravenous Every 24 hours  01/03/13 1207 01/04/13 0855   01/04/13 1000  vancomycin (VANCOCIN) 500 mg in sodium chloride 0.9 % 100 mL IVPB  Status:  Discontinued     500 mg 100 mL/hr over 60 Minutes Intravenous Every 12 hours 01/04/13 0855 01/04/13 1015   01/03/13 1600  piperacillin-tazobactam (ZOSYN) IVPB 3.375 g     3.375 g 12.5 mL/hr over 240 Minutes Intravenous Every 8 hours 01/03/13 1159     01/03/13 0800  piperacillin-tazobactam (ZOSYN) IVPB 4.5 g  Status:  Discontinued     4.5 g 200 mL/hr over 30 Minutes Intravenous  Once 01/03/13 0751 01/03/13 0754   01/03/13 0800  piperacillin-tazobactam (ZOSYN) IVPB 3.375 g     3.375 g 100 mL/hr over 30 Minutes Intravenous To Emergency Dept 01/03/13 0755 01/03/13 0852   01/03/13 0800  vancomycin (VANCOCIN) 1,250 mg in sodium chloride 0.9 % 250 mL IVPB     1,250 mg 166.7 mL/hr over 90 Minutes Intravenous STAT 01/03/13 0758 01/03/13 1109      Current Facility-Administered Medications  Medication Dose Route Frequency Provider Last Rate Last Dose  . 0.9 %  sodium chloride infusion  250 mL Intravenous PRN Jeanella Craze, NP 20 mL/hr at 01/03/13 1300 250 mL at 01/03/13 1300  . 0.9 %  sodium chloride infusion   Intravenous Continuous Leslye Peer, MD 50 mL/hr at 01/04/13 1105    .  acetaminophen (TYLENOL) solution 650 mg  650 mg Per Tube Q6H PRN Alyson Reedy, MD   650 mg at 01/04/13 0437  . amantadine (SYMMETREL) capsule 100 mg  100 mg Oral BID Jeanella Craze, NP   100 mg at 01/04/13 1013  . antiseptic oral rinse (BIOTENE) solution 15 mL  15 mL Mouth Rinse q12n4p Leslye Peer, MD      . chlorhexidine (PERIDEX) 0.12 % solution 15 mL  15 mL Mouth Rinse BID Leslye Peer, MD   15 mL at 01/04/13 0827  . dantrolene (DANTRIUM) capsule 25 mg  25 mg Oral TID Jeanella Craze, NP   25 mg at 01/04/13 1014  . feeding supplement (ENSURE COMPLETE) liquid 237 mL  237 mL Oral TID BM Jeanella Craze, NP   237 mL at 01/04/13 1011  . gabapentin (NEURONTIN) capsule 600 mg  600 mg Oral QID  Leslye Peer, MD   600 mg at 01/04/13 1014  . heparin injection 5,000 Units  5,000 Units Subcutaneous Q8H Jeanella Craze, NP   5,000 Units at 01/04/13 1610  . HYDROcodone-acetaminophen (NORCO/VICODIN) 5-325 MG per tablet 1 tablet  1 tablet Oral Q6H PRN Jeanella Craze, NP      . menthol-cetylpyridinium (CEPACOL) lozenge 3 mg  1 lozenge Oral PRN Alyson Reedy, MD   3 mg at 01/04/13 0046  . multivitamin with minerals tablet 1 tablet  1 tablet Oral Daily Jeanella Craze, NP   1 tablet at 01/04/13 1014  . nutrition supplement (JUVEN) powder packet 1 packet  1 packet Oral BID BM Lorraine Lax, RD   1 packet at 01/04/13 1102  . ondansetron (ZOFRAN) injection 4 mg  4 mg Intravenous Q6H PRN Alyson Reedy, MD   4 mg at 01/04/13 0437  . piperacillin-tazobactam (ZOSYN) IVPB 3.375 g  3.375 g Intravenous Q8H Christine E Shade, RPH   3.375 g at 01/04/13 0827  . potassium chloride SA (K-DUR,KLOR-CON) CR tablet 40 mEq  40 mEq Oral TID Alyson Reedy, MD   40 mEq at 01/04/13 0831  . tiZANidine (ZANAFLEX) tablet 4 mg  4 mg Oral TID PC & HS Jeanella Craze, NP        Assessment: He is clinically improved post foley and right NT placement on IV antibiotics.  ARI is improving.  Plan: He is going to need to be scheduled for a right percutaneous nephrolithotomy with antegrade ureteroscopy when he recovers from the sepsis.   I reviewed the details of the procedure and risks with the patient today.      LOS: 1 day    [place] Selke J 01/04/2013

## 2013-01-04 NOTE — Progress Notes (Signed)
eLink Physician-Brief Progress Note Patient Name: [name] Mccoy DOB: September 15, 1960 MRN: [id number]  Date of Service  01/04/2013   HPI/Events of Note   K 2.5  eICU Interventions  40 PO given x2 since Cr yesterday was 3.62 and new Cr is not resulted yet.      YACOUB,WESAM 01/04/2013, 6:30 AM

## 2013-01-04 NOTE — Progress Notes (Signed)
PULMONARY  / CRITICAL CARE MEDICINE  Name: [name] Mccoy MRN: [id number] DOB: 1960-05-24    ADMISSION DATE:  01/03/2013  REFERRING MD :  EDP PRIMARY SERVICE: PCCM  CHIEF COMPLAINT:  Sepsis (Urosepsis)  BRIEF PATIENT DESCRIPTION: [name] with PMH of MS & pressure ulcers requiring debridement admitted 8/18 with urosepsis.  SIGNIFICANT EVENTS / STUDIES:  8/18 - Admit with uropsepsis, perc nephrostomy, low dose neo via piv briefly 8/19 - improved, afebrile  LINES / TUBES: Coude foley 8/18 Annabell Howells) >>  R perc nephrostomy 8/18 >>   CULTURES: UC 8/18>>> GNR >>  BCx2 8/18>>> GNR >>   ANTIBIOTICS: Vanco 8/18>>> 8/19 Zosyn 8/18>>>   SUBJECTIVE: RN reports pt off neo since yesterday evening.  Pain with dressing changes.  Pt states "I am hungry"  VITAL SIGNS: Temp:  [97.5 F (36.4 C)-101.4 F (38.6 C)] 98.1 F (36.7 C) (08/19 0800) Pulse Rate:  [84-137] 104 (08/19 0800) Resp:  [10-28] 18 (08/19 0800) BP: (72-108)/(43-72) 94/72 mmHg (08/19 0600) SpO2:  [91 %-100 %] 100 % (08/19 0800) Weight:  [143 lb 4.8 oz (65 kg)] 143 lb 4.8 oz (65 kg) (08/18 1207)  INTAKE / OUTPUT: Intake/Output     08/18 0701 - 08/19 0700 08/19 0701 - 08/20 0700   I.V. (mL/kg) 3307.1 (50.9) 250 (3.8)   IV Piggyback 1100    Total Intake(mL/kg) 4407.1 (67.8) 250 (3.8)   Urine (mL/kg/hr) 5150 175 (1)   Total Output 5150 175   Net -742.9 +75          PHYSICAL EXAMINATION: General:  Chronically ill in NAD Neuro:  AAOx4, speech clear, MAE HEENT:  Mm pink/dry, good dentition, no JVD Cardiovascular:  s1s2 tachy, regular, no m/r/g Lungs:  resp's even/non-labored, lungs bilaterally clear Abdomen:  Flat/soft, bsx4 active, R perc nephrostomy tube Musculoskeletal:  No acute changes, chronic deformities associated with MS Skin:  Warm/dry, large sacral decubitus with clean wound bed, R hip decubitus with clean wound bed  LABS:  CBC Recent Labs     01/03/13  0441  01/04/13  0530  WBC  11.1*  21.3*   HGB  9.8*  8.9*  HCT  29.8*  27.5*  PLT  360  287   Coag's Recent Labs     01/03/13  0441  APTT  37  INR  1.39   BMET Recent Labs     01/03/13  0441  01/04/13  0530  NA  131*  146*  K  4.5  2.5*  CL  97  115*  CO2  19  18*  BUN  70*  42*  CREATININE  3.62*  1.51*  GLUCOSE  167*  113*   Electrolytes Recent Labs     01/03/13  0441  01/04/13  0530  CALCIUM  7.8*  7.9*   Sepsis Markers Recent Labs     01/03/13  1211  01/04/13  0530  PROCALCITON  >175.00  >175.00    Liver Enzymes Recent Labs     01/03/13  0441  AST  28  ALT  21  ALKPHOS  106  BILITOT  0.4  ALBUMIN  2.2*   Cardiac Enzymes Recent Labs     01/03/13  0441  TROPONINI  <0.30   Glucose No results found for this basename: GLUCAP,  in the last 72 hours  Imaging Ct Abdomen Pelvis Wo Contrast  01/03/2013   *RADIOLOGY REPORT*  Clinical Data: Generalized weakness.  Question UTI.  Lethargic. Bed sores.  Sore throat.  Multiple sclerosis.  CT ABDOMEN AND PELVIS WITHOUT CONTRAST  Technique:  Multidetector CT imaging of the abdomen and pelvis was performed following the standard protocol without intravenous contrast.  Comparison: 07/28/2011.  Findings: Proximal right ureteral 1.3 cm obstructing stone with mild to moderate right-sided hydronephrosis.  Several nonobstructing right renal calculi noted.  Small nonobstructing left lower pole renal calculi.  There is fullness of the left renal collecting system.  No definitive left ureteral obstructing stone.  Fullness of the collecting systems may be partially explained by bladder outlet obstruction as the bladder is prominent in size. Calcification along the posterior aspect of the bladder wall may reflect changes of chronic distension/inflammation.  Right lung base 3.7 mm nodule (series 4 image 7) not previously imaged. Given risk factors for bronchogenic carcinoma, follow-up chest CT at 1 year is recommended.  This recommendation follows the consensus statement:   Guidelines for Management of Small Pulmonary Nodules Detected on CT Scans:  A Statement from the Fleischner Society as published in Radiology 2005; 237:395-400.  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  No CT evidence of pericholecystic inflammation.  Ultrasound can be obtained for further delineation if this is of clinical concern.  Circumferential thickening rectosigmoid region may be related to under distension.  Inflammation not excluded.  No free intraperitoneal air.  Evaluation of solid abdominal viscera is limited by lack of IV contrast.  Taking this limitation into account:  No worrisome hepatic, splenic, adrenal or pancreatic lesion.  Left femoral neck fracture with fragmentation of the left femoral head and acetabulum.  This is new from the prior exam.  Subluxed/partially dislocated right femoral head with impaction against the posterior acetabulum.  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  Sacral decubiti with sclerotic appearance of the left ilium new from prior examination.  Osteomyelitis is a possibility.  IMPRESSION:  Proximal right ureteral 1.3 cm obstructing stone with mild to moderate right-sided hydronephrosis.  Several nonobstructing right renal calculi noted.  Small nonobstructing left lower pole renal calculi.  There is fullness of the left renal collecting system.  No definitive left ureteral obstructing stone.  Fullness of the collecting systems may be partially explained by bladder outlet obstruction as the bladder is prominent in size. Calcification along the posterior aspect of the bladder wall may reflect changes of chronic distension/inflammation.  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  No CT evidence of pericholecystic inflammation.  Ultrasound can be obtained for further delineation if this is of clinical concern.   Circumferential thickening rectosigmoid region may be related to under distension.  Inflammation not excluded.  No free intraperitoneal air.  Left femoral neck fracture with fragmentation of the left femoral head and acetabulum.  This is new from the prior exam.  Subluxed/partially dislocated right femoral head with impaction against the posterior acetabulum.  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  Sacral decubiti with sclerotic appearance of the left ilium new from prior examination.  Osteomyelitis is a possibility.  Right lung base 3.7 mm nodule (series 4 image 7) not previously imaged. Follow up as noted above.   Original Report Authenticated By: Lacy Duverney, M.D.   US Renal  01/03/2013   *RADIOLOGY REPORT*  Clinical Data: Renal insufficiency  RENAL / URINARY TRACT ULTRASOUND  Technique:  Complete ultrasound exam of the kidneys and urinary bladder was performed.  Comparison: CT scan from 01/03/2013  Findings:  The right kidney measures 13.3  cm in long axis.  The left kidney measures 12.8 cm.  Mild to moderate left hydronephrosis is evident and while comparing across modalities is difficult, the degree of collecting system distention appears to be roughly similar to the previous CT scan. At least two stones in the 5-6 mm size range are seen in the left kidney towards the lower pole.  There is mild to moderate right-sided hydronephrosis with multiple right renal stones evident.  Images in the central anatomic pelvis reveal a distended bladder with posterior bladder wall thickening and intraluminal debris.  Incidental imaging of the gallbladder shows multiple gallstones.  Impression:  Bilateral mild to moderate hydronephrosis with bilateral renal stones.  There is seen to be some associated dilatation of the proximal right ureter although an etiology for the bilateral hydronephrosis is not evident on today's study.  The patient has multiple bilateral renal stones and the obstruction may be  secondary to the stone disease.  Posterior bladder wall thickening with debris in the urinary bladder.  Bladder infection would be a consideration.   Original Report Authenticated By: Kennith Center, M.D.   Ir Perc Nephrostomy Right  01/03/2013   *RADIOLOGY REPORT*  Clinical Data:  Urosepsis.  Obstructing right proximal ureteral calculus with hydronephrosis.  RIGHT PERCUTANEOUS NEPHROSTOMY CATHETER PLACEMENT UNDER ULTRASOUND AND FLUOROSCOPIC GUIDANCE  Technique: The procedure, risks (including but not limited to bleeding, infection, organ damage), benefits, and alternatives were explained to the patient.  Questions regarding the procedure were encouraged and answered.  The patient understands and consents to the procedure. The rightflank region prepped with Betadine, draped in usual sterile fashion, infiltrated locally with 1% lidocaine.The patient was receiving adequate antibiotic prophylactic coverage.  Intravenous Fentanyl and Versed were administered as conscious sedation during continuous cardiorespiratory monitoring by the radiology RN, with a total moderate sedation time of in less than 30 minutes.   Under real-time ultrasound guidance, a 21-gauge trocar needle was advanced into a posterior lower pole calyx. Ultrasound image documentation was saved. No definite to urine could be aspirated. The collecting system was not significantly hydronephrotic. Therefore, under fluoroscopy a 21-gauge micropuncture needle was advanced towards a posterior lower pole radiodense calculus.  A 018 guide wire advanced easily within the renal collecting system. Needle was exchanged over  guidewire for transitional dilator. Contrast injection confirmed appropriate positioning. Catheter was exchanged over a guidewire for a 10 French pigtail catheter, formed centrally within the right renal collecting system. Contrast injection confirms appropriate positioning and patency. Catheter secured externally with 0 Prolene suture and  placed to external drain bag. No immediate complication.  Fluoroscopy time: 2 minutes 6seconds  IMPRESSION Technically successful right percutaneous nephrostomy catheter placement.   Original Report Authenticated By: D. Andria Rhein, MD   Dg Chest Portable 1 View  01/03/2013   *RADIOLOGY REPORT*  Clinical Data: Fever, dizziness and weakness.  PORTABLE CHEST - 1 VIEW  Comparison: Single view of the chest 10/12/2012 and 09/15/2012.  Findings: Again seen is right upper lobe scarring.  The lungs appear emphysematous.  No pneumothorax or pleural effusion is identified.  Heart size is normal.  IMPRESSION:  1.  No acute finding. 2.  Pulmonary hyperinflation and right upper lobe scarring, unchanged.   Original Report Authenticated By: Holley Dexter, M.D.    ASSESSMENT / PLAN:  RENAL A:   Right Obstructing Renal Calculi - 1.3 cm R stone with mild to moderate hydronephrosis on CT.  Multiple small non-obs stones as well.  Acute Kidney Injury - in setting of obstructing renal  calculi, improving  Hyponatremia R Pyleonephritis Enlarged, ? Inflamed prostate on CT scan  Hypokalemia P:   - Urology consult regarding catheter placement / obstructing stones.  #22 Coude placed 8/18 - IR consult for placement of percutaneous nephrostomy drain placed on 8/18, appreciate assistance - abx as below - renal dosing of home medications  - replace K+, repeat BMP evening 8/19   PULMONARY A: Pulmonary Nodule - incidental finding on CT ABD P:   -will need outpatient follow up for surveillance of nodule  CARDIOVASCULAR A:  Hypotension / Tachycardia - in setting of sepsis, ? Component of autonomic dysfunction with MS.  Troponin negative.  Briefly required neo 8/18 Sepsis  P:  -lactic acid cleared 8/18 -will accept SBP of 85 with good mental status -See ID   GASTROINTESTINAL A:   Diarrhea  Protein Calorie Malnutrition P:   -See ID -PO diet as tolerated  HEMATOLOGIC A:   Anemia - microcytic, likely of  chronic disease P:  -monitor H/H with hydration  -no evidence of acute bleeding -tx for Hgb <7%, active bleeding or MI <8%  INFECTIOUS A:   GNR bacteremia UTI, R pyelo ? Prostatitis based on abd CT scan 8/18 Diarrhea - small volume Sacral Decubitus, R hip Decubitus  - large open wounds with clean wound beds, no odor or drainage ? Sacral Osteo - concern noted on CT P:   -abx as above, plan d/c vanco on 8/19 -blood & urine cultures as above -WOC for decubitus, ? 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 Magnus Ivan 8/18 regarding treatment options - given patients non-ambulatory state with chronic wounds/ contractures, he would not likely be a candidate for surgical options.  Recommend medical management of pain.   Canary Brim, NP-C [facility] Pgr: [phone] or [phone]   I have personally obtained a history, examined the patient, evaluated laboratory and imaging results, formulated the assessment and plan and placed orders.  Levy Pupa, MD, PhD 01/04/2013, 10:20 AM [facility] [phone] or if no answer [phone]

## 2013-01-04 NOTE — Progress Notes (Signed)
eLink Physician-Brief Progress Note Patient Name: [name] Mccoy DOB: November 04, 1960 MRN: [id number]  Date of Service  01/04/2013   HPI/Events of Note   Pain and nausea  eICU Interventions  Zofran and tylenol ordered.      Arshiya Jakes 01/04/2013, 4:23 AM

## 2013-01-04 NOTE — Progress Notes (Signed)
ANTIBIOTIC CONSULT NOTE - Follow up  Pharmacy Consult for Vancomycin, Zosyn Indication: Sepsis  No Known Allergies  Patient Measurements: Height: 6\' 3"  (190.5 cm) Weight: 143 lb 4.8 oz (65 kg) IBW/kg (Calculated) : 84.5  Vital Signs: Temp: 98.1 F (36.7 C) (08/19 0800) Temp src: Oral (08/19 0800) BP: 94/72 mmHg (08/19 0600) Pulse Rate: 111 (08/19 0600)  Labs:  Recent Labs  01/03/13 0441 01/04/13 0530  WBC 11.1* 21.3*  HGB 9.8* 8.9*  PLT 360 287  CREATININE 3.62* 1.51*   Estimated Creatinine Clearance: 53.8 ml/min (by C-G formula based on Cr of 1.51).  Microbiology: Recent Results (from the past 720 hour(s))  CULTURE, BLOOD (ROUTINE X 2)     Status: None   Collection Time    01/03/13  7:41 AM      Result Value Range Status   Specimen Description BLOOD NECK   Final   Special Requests BOTTLES DRAWN AEROBIC AND ANAEROBIC [facility] EACH   Final   Culture  Setup Time     Final   Value: 01/03/2013 11:02     Performed at [facility]   Culture     Final   Value: GRAM NEGATIVE RODS     Note: Gram Stain Report Called to,Read Back By and Verified With:  MEGAN STOCKS AT 0100A ON [id number] BY BRMEL     Performed at [facility]   Report Status PENDING   Incomplete  CULTURE, BLOOD (ROUTINE X 2)     Status: None   Collection Time    01/03/13  7:46 AM      Result Value Range Status   Specimen Description BLOOD RIGHT HAND   Final   Special Requests BOTTLES DRAWN AEROBIC AND ANAEROBIC 2 CC EACH   Final   Culture  Setup Time     Final   Value: 01/03/2013 11:02     Performed at [facility]   Culture     Final   Value: GRAM NEGATIVE RODS     Note: Gram Stain Report Called to,Read Back By and Verified With:  MEGAN STOCKS ON [id number] AT 0144A BY BRMEL     Performed at [facility]   Report Status PENDING   Incomplete  URINE CULTURE     Status: None   Collection Time    01/03/13  8:17 AM      Result Value Range Status   Specimen Description URINE,  CATHETERIZED   Final   Special Requests NONE   Final   Culture  Setup Time     Final   Value: 01/03/2013 11:02     Performed at [facility] Count     Final   Value: >=100,000 COLONIES/ML     Performed at [facility]   Culture     Final   Value: GRAM NEGATIVE RODS     Performed at [facility]   Report Status PENDING   Incomplete  MRSA PCR SCREENING     Status: Abnormal   Collection Time    01/03/13 12:56 PM      Result Value Range Status   MRSA by PCR POSITIVE (*) NEGATIVE Final   Comment:            The GeneXpert MRSA Assay (FDA     approved for NASAL specimens     only), is one component of a     comprehensive MRSA colonization     surveillance program. It is  not     intended to diagnose MRSA     infection nor to guide or     monitor treatment for     MRSA infections.     RESULT CALLED TO, READ BACK BY AND VERIFIED WITH:     Jolayne Panther RN 16:00 01/03/13 ([email or web address])     Performed at [facility]     Medications:  Anti-infectives   Start     Dose/Rate Route Frequency Ordered Stop   01/04/13 1000  vancomycin (VANCOCIN) IVPB 750 mg/150 ml premix     750 mg 150 mL/hr over 60 Minutes Intravenous Every 24 hours 01/03/13 1207     01/03/13 1600  piperacillin-tazobactam (ZOSYN) IVPB 3.375 g     3.375 g 12.5 mL/hr over 240 Minutes Intravenous Every 8 hours 01/03/13 1159     01/03/13 0800  piperacillin-tazobactam (ZOSYN) IVPB 4.5 g  Status:  Discontinued     4.5 g 200 mL/hr over 30 Minutes Intravenous  Once 01/03/13 0751 01/03/13 0754   01/03/13 0800  piperacillin-tazobactam (ZOSYN) IVPB 3.375 g     3.375 g 100 mL/hr over 30 Minutes Intravenous To Emergency Dept 01/03/13 0755 01/03/13 0852   01/03/13 0800  vancomycin (VANCOCIN) 1,250 mg in sodium chloride 0.9 % 250 mL IVPB     1,250 mg 166.7 mL/hr over 90 Minutes Intravenous STAT 01/03/13 0758 01/03/13 1109     Assessment: 52 yoM admitted 8/18 with r/o sepsis; hx of weakness, lethargy  and confusion started 2 days ago per caregiver.  Hx of UTIs in the past (current UA: large leukocytes, +WBC, many bacteria).  CXR without acute changes.  CT with concern for possible osteomyelitis of rt femoral head (avascular necrosis).  Pharmacy asked to dose vancomycin and zosyn.  First doses of Vanc 1250mg  IV and Zosyn 3.375mg  IV sent to ED 8/18.  SCr acutely elevated on admission, but significantly improved today (SCr 1.5) s/p placement of right perc nephrostomy tube on 8/18.  CrCl ~ 54 ml/min (CG)  WBC 21  Blood and urine cultures with GNR - Follow up identification and sensitivities.  Goal of Therapy:  Vancomycin trough level 15-20 mcg/ml  Plan:   Increase to Vancomycin 500mg  IV q12h. Continue Zosyn 3.375g IV Q8H infused over 4hrs.   Measure Vanc trough at steady state.  Follow up renal fxn and culture results.  Lynann Beaver PharmD, BCPS Pager [phone] 01/04/2013 8:51 AM

## 2013-01-04 NOTE — Progress Notes (Signed)
CRITICAL VALUE ALERT  Critical value received: BC positive for gram - rods in both bottles  Date of notification:  01/04/13  Time of notification:  0144  Critical value read back:yes  Nurse who received alert:  M. Daijon Wenke  MD notified (1st page):  Dr. Molli Knock  Time of first page:  0200  MD notified (2nd page):  Time of second page:  Responding MD:  Dr. Molli Knock  Time MD responded:  0200

## 2013-01-05 LAB — BASIC METABOLIC PANEL
BUN: 42 mg/dL — ABNORMAL HIGH (ref 6–23)
BUN: 31 mg/dL — ABNORMAL HIGH (ref 6–23)
Calcium: 8.2 mg/dL — ABNORMAL LOW (ref 8.4–10.5)
Chloride: 115 mEq/L — ABNORMAL HIGH (ref 96–112)
Creatinine, Ser: 0.94 mg/dL (ref 0.50–1.35)
GFR calc Af Amer: 61 mL/min — ABNORMAL LOW (ref >90)
GFR calc non Af Amer: 52 mL/min — ABNORMAL LOW (ref >90)
GFR calc non Af Amer: >90 mL/min (ref >90)
Glucose, Bld: 167 mg/dL — ABNORMAL HIGH (ref 70–99)
Potassium: 2.5 mEq/L — CL (ref 3.5–5.1)

## 2013-01-05 LAB — MAGNESIUM: Magnesium: 1.7 mg/dL (ref 1.5–2.5)

## 2013-01-05 LAB — CBC
Hemoglobin: 8.7 g/dL — ABNORMAL LOW (ref 13.0–17.0)
MCH: 24 pg — ABNORMAL LOW (ref 26.0–34.0)
MCHC: 31.6 g/dL (ref 30.0–36.0)
Platelets: 267 10*3/uL (ref 150–400)

## 2013-01-05 MED ORDER — POTASSIUM CHLORIDE CRYS ER 20 MEQ PO TBCR
40.0000 meq | EXTENDED_RELEASE_TABLET | Freq: Once | ORAL | Status: AC
  Administered 2013-01-05: 40 meq via ORAL
  Filled 2013-01-05: qty 2

## 2013-01-05 MED ORDER — CHLORHEXIDINE GLUCONATE CLOTH 2 % EX PADS
6.0000 | MEDICATED_PAD | Freq: Every day | CUTANEOUS | Status: DC
  Administered 2013-01-05: 6 via TOPICAL

## 2013-01-05 MED ORDER — MUPIROCIN 2 % EX OINT
1.0000 "application " | TOPICAL_OINTMENT | Freq: Two times a day (BID) | CUTANEOUS | Status: DC
  Administered 2013-01-05 – 2013-01-08 (×7): 1 via NASAL
  Filled 2013-01-05 (×2): qty 22

## 2013-01-05 NOTE — Progress Notes (Signed)
PULMONARY  / CRITICAL CARE MEDICINE  Name: ADISON JERGER MRN: [id number] DOB: [birth year]    ADMISSION DATE:  01/03/2013  REFERRING MD :  EDP PRIMARY SERVICE: PCCM  CHIEF COMPLAINT:  Sepsis (Urosepsis)  BRIEF PATIENT DESCRIPTION: [name] with PMH of MS & pressure ulcers requiring debridement admitted 8/18 with urosepsis.  SIGNIFICANT EVENTS / STUDIES:  8/18 - Admit with uropsepsis, perc nephrostomy, low dose neo via piv briefly 8/19 - improved, afebrile 8/20 - fever / wbc improved, nephrostomy drainage clearing  LINES / TUBES: Coude foley 8/18 Annabell Howells) >>  R perc nephrostomy 8/18 >>   CULTURES: UC 8/18>>> GNR >>providencia stuartii>>>sens zosyn BCx2 8/18>>> GNR >>   ANTIBIOTICS: Vanco 8/18>>> 8/19 Zosyn 8/18>>>   SUBJECTIVE: No acute events.  BP stable, fever improved.  VITAL SIGNS: Temp:  [97.1 F (36.2 C)-99.7 F (37.6 C)] 99.1 F (37.3 C) (08/20 0800) Pulse Rate:  [109-124] 112 (08/20 0800) Resp:  [15-21] 21 (08/20 1000) BP: (105-130)/(57-79) 114/67 mmHg (08/20 1000) SpO2:  [85 %-100 %] 100 % (08/20 1000) Weight:  [142 lb 13.7 oz (64.8 kg)] 142 lb 13.7 oz (64.8 kg) (08/20 0600)  INTAKE / OUTPUT: Intake/Output     08/19 0701 - 08/20 0700 08/20 0701 - 08/21 0700   I.V. (mL/kg) 1631.3 (25.2) 200 (3.1)   IV Piggyback 150 50   Total Intake(mL/kg) 1781.3 (27.5) 250 (3.9)   Urine (mL/kg/hr) 3120 (2) 100 (0.3)   Total Output 3120 100   Net -1338.8 +150        Stool Occurrence 3 x      PHYSICAL EXAMINATION: General:  Chronically ill in NAD Neuro:  AAOx4, speech clear, MAE HEENT:  Mm pink/dry, good dentition, no JVD Cardiovascular:  s1s2 tachy, regular, no m/r/g Lungs:  resp's even/non-labored, lungs bilaterally clear Abdomen:  Flat/soft, bsx4 active, R perc nephrostomy tube Musculoskeletal:  No acute changes, chronic deformities associated with MS Skin:  Warm/dry, large sacral decubitus with clean wound bed, R hip decubitus with clean wound bed - all  dressings c/d/i  LABS:  CBC Recent Labs     01/03/13  0441  01/04/13  0530  01/05/13  0343  WBC  11.1*  21.3*  15.1*  HGB  9.8*  8.9*  8.7*  HCT  29.8*  27.5*  27.5*  PLT  360  287  267   Coag's Recent Labs     01/03/13  0441  APTT  37  INR  1.39   BMET Recent Labs     01/04/13  0530  01/04/13  1546  01/05/13  0343  NA  146*  144  142  K  2.5*  4.2  3.2*  CL  115*  116*  113*  CO2  18*  16*  19  BUN  42*  39*  31*  CREATININE  1.51*  1.21  0.94  GLUCOSE  113*  169*  167*   Electrolytes Recent Labs     01/04/13  0530  01/04/13  1546  01/05/13  0343  CALCIUM  7.9*  8.1*  8.2*  MG   --    --   1.7   Sepsis Markers Recent Labs     01/03/13  1211  01/04/13  0530  01/05/13  0343  PROCALCITON  >175.00  >175.00  93.33    Liver Enzymes Recent Labs     01/03/13  0441  AST  28  ALT  21  ALKPHOS  106  BILITOT  0.4  ALBUMIN  2.2*   Cardiac Enzymes Recent Labs     01/03/13  0441  TROPONINI  <0.30   Glucose No results found for this basename: GLUCAP,  in the last 72 hours  Imaging Ir Perc Nephrostomy Right  01/03/2013   *RADIOLOGY REPORT*  Clinical Data:  Urosepsis.  Obstructing right proximal ureteral calculus with hydronephrosis.  RIGHT PERCUTANEOUS NEPHROSTOMY CATHETER PLACEMENT UNDER ULTRASOUND AND FLUOROSCOPIC GUIDANCE  Technique: The procedure, risks (including but not limited to bleeding, infection, organ damage), benefits, and alternatives were explained to the patient.  Questions regarding the procedure were encouraged and answered.  The patient understands and consents to the procedure. The rightflank region prepped with Betadine, draped in usual sterile fashion, infiltrated locally with 1% lidocaine.The patient was receiving adequate antibiotic prophylactic coverage.  Intravenous Fentanyl and Versed were administered as conscious sedation during continuous cardiorespiratory monitoring by the radiology RN, with a total moderate sedation time of in  less than 30 minutes.   Under real-time ultrasound guidance, a 21-gauge trocar needle was advanced into a posterior lower pole calyx. Ultrasound image documentation was saved. No definite to urine could be aspirated. The collecting system was not significantly hydronephrotic. Therefore, under fluoroscopy a 21-gauge micropuncture needle was advanced towards a posterior lower pole radiodense calculus.  A 018 guide wire advanced easily within the renal collecting system. Needle was exchanged over  guidewire for transitional dilator. Contrast injection confirmed appropriate positioning. Catheter was exchanged over a guidewire for a 10 French pigtail catheter, formed centrally within the right renal collecting system. Contrast injection confirms appropriate positioning and patency. Catheter secured externally with 0 Prolene suture and placed to external drain bag. No immediate complication.  Fluoroscopy time: 2 minutes 6seconds  IMPRESSION Technically successful right percutaneous nephrostomy catheter placement.   Original Report Authenticated By: D. Andria Rhein, MD   Ir US Guide Bx Asp/drain  01/05/2013   *RADIOLOGY REPORT*  Clinical Data:  Urosepsis.  Obstructing right proximal ureteral calculus with hydronephrosis.  RIGHT PERCUTANEOUS NEPHROSTOMY CATHETER PLACEMENT UNDER ULTRASOUND AND FLUOROSCOPIC GUIDANCE  Technique: The procedure, risks (including but not limited to bleeding, infection, organ damage), benefits, and alternatives were explained to the patient.  Questions regarding the procedure were encouraged and answered.  The patient understands and consents to the procedure. The rightflank region prepped with Betadine, draped in usual sterile fashion, infiltrated locally with 1% lidocaine.The patient was receiving adequate antibiotic prophylactic coverage.  Intravenous Fentanyl and Versed were administered as conscious sedation during continuous cardiorespiratory monitoring by the radiology RN, with a total  moderate sedation time of in less than 30 minutes.   Under real-time ultrasound guidance, a 21-gauge trocar needle was advanced into a posterior lower pole calyx. Ultrasound image documentation was saved. No definite to urine could be aspirated. The collecting system was not significantly hydronephrotic. Therefore, under fluoroscopy a 21-gauge micropuncture needle was advanced towards a posterior lower pole radiodense calculus.  A 018 guide wire advanced easily within the renal collecting system. Needle was exchanged over  guidewire for transitional dilator. Contrast injection confirmed appropriate positioning. Catheter was exchanged over a guidewire for a 10 French pigtail catheter, formed centrally within the right renal collecting system. Contrast injection confirms appropriate positioning and patency. Catheter secured externally with 0 Prolene suture and placed to external drain bag. No immediate complication.  Fluoroscopy time: 2 minutes 6seconds  IMPRESSION Technically successful right percutaneous nephrostomy catheter placement.   Original Report Authenticated By: D. Andria Rhein, MD    ASSESSMENT / PLAN:  RENAL A:  Right Obstructing Renal Calculi - 1.3 cm R stone with mild to moderate hydronephrosis on CT.  Multiple small non-obs stones as well.  Acute Kidney Injury - in setting of obstructing renal calculi, improving  Hyponatremia R Pyleonephritis Enlarged, ? Inflamed prostate on CT scan  Hypokalemia P:   - Urology consult regarding catheter placement / obstructing stones.  #22 Coude placed 8/18 - IR consult for placement of percutaneous nephrostomy drain placed on 8/18, appreciate assistance - abx as below - renal dosing of home medications per pharmacy - replace K+ 8/20 - f/u bmp in am - will need right percutaneous nephrolithotomy with antegrade ureteroscopy when he recovers from the sepsis - ??if he will be able to return home with assistance of caregiver vs  SNF   PULMONARY A: Pulmonary Nodule - incidental finding on CT ABD P:   -will need outpatient follow up for surveillance of nodule  CARDIOVASCULAR A:  Hypotension / Tachycardia - in setting of sepsis, ? Component of autonomic dysfunction with MS.  Troponin negative.  Briefly required neo 8/18 Sepsis  P:  -lactic acid cleared 8/18 -will accept SBP of 85 with good mental status -See ID   GASTROINTESTINAL A:   Diarrhea - resolved.  Protein Calorie Malnutrition P:   -See ID -PO diet as tolerated  HEMATOLOGIC A:   Anemia - microcytic, likely of chronic disease P:  -monitor H/H with hydration  -no evidence of acute bleeding -tx for Hgb <7%, active bleeding or MI <8%  INFECTIOUS A:   GNR bacteremia - providencia stuartii, sens zosyn UTI, R pyelo ? Prostatitis based on abd CT scan 8/18 Diarrhea - small volume Sacral Decubitus, R hip Decubitus  - large open wounds with clean wound beds, no odor or drainage ? Sacral Osteo - concern noted on CT P:   -abx as above -blood & urine cultures as above -WOC for decubitus, ? 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 Magnus Ivan 8/18 regarding treatment options - given patients non-ambulatory state with chronic wounds/ contractures, he would not likely be a candidate for surgical options.  Recommend medical management of pain.   Canary Brim, NP-C [facility] Pgr:  [phone] or [phone]   I have personally obtained a history, examined the patient, evaluated laboratory and imaging results, formulated the assessment and plan and placed orders.   Levy Pupa, MD, PhD 01/05/2013, 5:16 PM [facility] [phone] or if no answer [phone]

## 2013-01-06 DIAGNOSIS — N200 Calculus of kidney: Secondary | ICD-10-CM

## 2013-01-06 LAB — BASIC METABOLIC PANEL
CO2: 22 mEq/L (ref 19–32)
Calcium: 8.2 mg/dL — ABNORMAL LOW (ref 8.4–10.5)
Creatinine, Ser: 0.8 mg/dL (ref 0.50–1.35)

## 2013-01-06 LAB — CBC
HCT: 33.3 % — ABNORMAL LOW (ref 39.0–52.0)
MCHC: 31.5 g/dL (ref 30.0–36.0)
MCV: 77.3 fL — ABNORMAL LOW (ref 78.0–100.0)
RDW: 16.4 % — ABNORMAL HIGH (ref 11.5–15.5)

## 2013-01-06 LAB — CULTURE, BLOOD (ROUTINE X 2)

## 2013-01-06 LAB — MAGNESIUM: Magnesium: 1.5 mg/dL (ref 1.5–2.5)

## 2013-01-06 MED ORDER — CHLORHEXIDINE GLUCONATE CLOTH 2 % EX PADS
6.0000 | MEDICATED_PAD | Freq: Every day | CUTANEOUS | Status: DC
  Administered 2013-01-06 – 2013-01-07 (×2): 6 via TOPICAL

## 2013-01-06 MED ORDER — DEXTROSE 5 % IV SOLN
1.0000 g | INTRAVENOUS | Status: DC
  Administered 2013-01-06: 1 g via INTRAVENOUS
  Filled 2013-01-06 (×3): qty 10

## 2013-01-06 NOTE — Progress Notes (Signed)
PULMONARY  / CRITICAL CARE MEDICINE  Name: [name] Mccoy MRN: [id number] DOB: 1961/03/17    ADMISSION DATE:  01/03/2013  REFERRING MD :  EDP PRIMARY SERVICE: PCCM  CHIEF COMPLAINT:  Sepsis (Urosepsis)  BRIEF PATIENT DESCRIPTION: [name] with PMH of MS & pressure ulcers requiring debridement admitted 8/18 with urosepsis.  SIGNIFICANT EVENTS / STUDIES:  8/18 - Admit with uropsepsis, perc nephrostomy, low dose neo via piv briefly 8/19 - improved, afebrile 8/20 - fever / wbc improved, nephrostomy drainage clearing  LINES / TUBES: Coude foley 8/18 ([place]) >>  R perc nephrostomy 8/18 >>   CULTURES: UC 8/18>>> GNR >>providencia stuartii>>>sens zosyn, ceftriaxone,  BCx2 8/18>>> GNR >> same  ANTIBIOTICS: Vanco 8/18>>> 8/19 Zosyn 8/18>>> 8/21 Ceftriaxone 8/21 >>   SUBJECTIVE:  Febrile overnight Remains tachycardic Denies pain  VITAL SIGNS: Temp:  [98.6 F (37 C)-102.7 F (39.3 C)] 99.4 F (37.4 C) (08/21 0800) Pulse Rate:  [33-140] 132 (08/21 0800) Resp:  [13-39] 22 (08/21 0800) BP: (105-136)/(53-97) 110/77 mmHg (08/21 0800) SpO2:  [96 %-100 %] 98 % (08/21 0800) Weight:  [66 kg (145 lb 8.1 oz)] 66 kg (145 lb 8.1 oz) (08/21 0500)  INTAKE / OUTPUT: Intake/Output     08/20 0701 - 08/21 0700 08/21 0701 - 08/22 0700   I.V. (mL/kg) 1100 (16.7) 50 (0.8)   IV Piggyback 150 12.5   Total Intake(mL/kg) 1250 (18.9) 62.5 (0.9)   Urine (mL/kg/hr) 3865 (2.4) 160 (0.9)   Total Output 3865 160   Net -2615 -97.5        Stool Occurrence 6 x      PHYSICAL EXAMINATION: General:  Chronically ill in NAD Neuro:  AAOx4, speech clear, MAE HEENT:  Mm pink/dry, good dentition, no JVD Cardiovascular:  s1s2 tachy, regular, no m/r/g Lungs:  resp's even/non-labored, lungs bilaterally clear Abdomen:  Flat/soft, bsx4 active, R perc nephrostomy tube Musculoskeletal:  No acute changes, chronic deformities associated with MS Skin:  Warm/dry, large sacral decubitus with clean wound bed, R hip  decubitus with clean wound bed - all dressings c/d/i  LABS:  CBC Recent Labs     01/04/13  0530  01/05/13  0343  01/06/13  0340  WBC  21.3*  15.1*  13.3*  HGB  8.9*  8.7*  10.5*  HCT  27.5*  27.5*  33.3*  PLT  287  267  261   Coag's No results found for this basename: APTT, INR,  in the last 72 hours BMET Recent Labs     01/04/13  1546  01/05/13  0343  01/06/13  0340  NA  144  142  140  K  4.2  3.2*  4.7  CL  116*  113*  109  CO2  16*  19  22  BUN  39*  31*  19  CREATININE  1.21  0.94  0.80  GLUCOSE  169*  167*  94   Electrolytes Recent Labs     01/04/13  1546  01/05/13  0343  01/06/13  0340  CALCIUM  8.1*  8.2*  8.2*  MG   --   1.7  1.5   Sepsis Markers Recent Labs     01/03/13  1211  01/04/13  0530  01/05/13  0343  PROCALCITON  >175.00  >175.00  93.33    Liver Enzymes No results found for this basename: AST, ALT, ALKPHOS, BILITOT, ALBUMIN,  in the last 72 hours Cardiac Enzymes No results found for this basename: TROPONINI, PROBNP,  in the last 72 hours Glucose No results found for this basename: GLUCAP,  in the last 72 hours  Imaging No results found.  ASSESSMENT / PLAN:  RENAL A:   Right Obstructing Renal Calculi - 1.3 cm R stone with mild to moderate hydronephrosis on CT.  Multiple small non-obs stones as well.  Acute Kidney Injury - in setting of obstructing renal calculi, improving  Hyponatremia R Pyleonephritis Enlarged, ? Inflamed prostate on CT scan  Hypokalemia P:   - IR consult > placement of percutaneous nephrostomy drain placed on 8/18, appreciate assistance - adjust abx as below - renal dosing of home medications per pharmacy - s/p right percutaneous nephrostomy; will need nephrolithotomy with antegrade ureteroscopy when he recovers from the sepsis, probably as outpt  PULMONARY A: Pulmonary Nodule - incidental finding on CT ABD P:   -will need outpatient follow up for surveillance of nodule  CARDIOVASCULAR A:   Hypotension / Tachycardia - in setting of sepsis, ? Component of autonomic dysfunction with MS.  Troponin negative.  Briefly required neo 8/18 > resolved Sepsis  P:  -tolerating some sinus tachycardia, his baseline  GASTROINTESTINAL A:   Diarrhea - resolved.  Protein Calorie Malnutrition P:   -See ID -PO diet as tolerated  HEMATOLOGIC A:   Anemia - microcytic, likely of chronic disease P:  -monitor H/H with hydration  -no evidence of acute bleeding -tx for Hgb <7%, active bleeding or MI <8%  INFECTIOUS A:   Providencia stuartii bacteremia, sens zosyn and ceftriaxone UTI, R pyelo ? Prostatitis based on abd CT scan 8/18 Diarrhea - small volume Sacral Decubitus, R hip Decubitus  - large open wounds with clean wound beds, no odor or drainage ? Sacral Osteo - concern noted on CT P:   -change zosyn to ceftriaxone on 8/21 -will review CT scan with [facility] to insure no need for formal consult regarding possible sacral osteo.   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 Magnus Ivan 8/18 regarding treatment options - given patients non-ambulatory state with chronic wounds/ contractures, he would not likely be a candidate for surgical options.  Recommend medical management of pain.   I have personally obtained a history, examined the patient, evaluated laboratory and imaging results, formulated the assessment and plan and placed orders.  Will transfer to [facility] service as of 8/22.  Can likely go to floor bed 8/21 or 22. May need SF post-discharge, IV abx.   Levy Pupa, MD, PhD 01/06/2013, 9:35 AM [facility] [phone] or if no answer [phone]

## 2013-01-06 NOTE — Progress Notes (Signed)
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 Sherlean Foot.  His review of scans notes no edema outside bone / surrounding area of concern.  Currently, no need for surgical intervention.    Note that there has been quick source control / improvement of sepsis with abx / percutaneous nephrostomy drain.  Currently, patients acute sepsis phase has resolved. If her were to have new sepsis / unresolved issues would need to re-examine area or consider biopsy of site.  Understanding that if this were to be needed, it would be a major surgery that would likely require extensive wound management with his current wounds.       Canary Brim, NP-C [facility] Pgr: [phone] or [phone]  Levy Pupa, MD, PhD 01/06/2013, 1:19 PM [facility] [phone] or if no answer [phone]

## 2013-01-06 NOTE — Progress Notes (Signed)
Patient transferred to [facility] from [facility] ICU. From report and discussion with pt, pt plans to discharge home with caregiver and [facility] services when medically stable.  RNCM to follow for home needs.  No further social work needs identified at this time.  CSW signing off.  Please re-consult unit CSW if social work needs arise.   Jacklynn Lewis, MSW, LCSWA  Clinical Social Work [phone]

## 2013-01-06 NOTE — Progress Notes (Signed)
Subjective: Patient states he is doing better, he did have temp of 102.32F with tachycardia last evening, temp down to 99.7F, patient still tachycardic. He denies any drain site pain.  Objective: Physical Exam: BP 117/64  Pulse 125  Temp(Src) 99.4 F (37.4 C) (Oral)  Resp 19  Ht 6\' 3"  (1.905 m)  Wt 145 lb 8.1 oz (66 kg)  BMI 18.19 kg/m2  SpO2 100%  Right PCN 8F site C/D/I, no signs of bleeding, infection, output 1600cc (725cc)  Labs: CBC  Recent Labs  01/05/13 0343 01/06/13 0340  WBC 15.1* 13.3*  HGB 8.7* 10.5*  HCT 27.5* 33.3*  PLT 267 261   BMET  Recent Labs  01/05/13 0343 01/06/13 0340  NA 142 140  K 3.2* 4.7  CL 113* 109  CO2 19 22  GLUCOSE 167* 94  BUN 31* 19  CREATININE 0.94 0.80  CALCIUM 8.2* 8.2*   LFT No results found for this basename: PROT, ALBUMIN, AST, ALT, ALKPHOS, BILITOT, BILIDIR, IBILI, LIPASE,  in the last 72 hours PT/INR No results found for this basename: LABPROT, INR,  in the last 72 hours   Studies/Results: No results found.  Assessment/Plan: Right obstructing renal calculi, hydronephrosis. Urosepsis s/p Right PCN 8/18 draining well 1600cc last 24 hrs. Cr. 0.80 (0.94) Continue to flush PCN and monitor output.    LOS: 3 days    [name] Mccoy 01/06/2013 10:51 AM

## 2013-01-07 DIAGNOSIS — D72829 Elevated white blood cell count, unspecified: Secondary | ICD-10-CM

## 2013-01-07 DIAGNOSIS — L89309 Pressure ulcer of unspecified buttock, unspecified stage: Secondary | ICD-10-CM

## 2013-01-07 DIAGNOSIS — E860 Dehydration: Secondary | ICD-10-CM

## 2013-01-07 LAB — CBC
HCT: 29 % — ABNORMAL LOW (ref 39.0–52.0)
Hemoglobin: 9.1 g/dL — ABNORMAL LOW (ref 13.0–17.0)
MCH: 24.1 pg — ABNORMAL LOW (ref 26.0–34.0)
MCV: 76.9 fL — ABNORMAL LOW (ref 78.0–100.0)
RBC: 3.77 MIL/uL — ABNORMAL LOW (ref 4.22–5.81)
WBC: 11.1 10*3/uL — ABNORMAL HIGH (ref 4.0–10.5)

## 2013-01-07 LAB — BASIC METABOLIC PANEL
BUN: 16 mg/dL (ref 6–23)
Potassium: 3.1 mEq/L — ABNORMAL LOW (ref 3.5–5.1)

## 2013-01-07 MED ORDER — SULFAMETHOXAZOLE-TMP DS 800-160 MG PO TABS
1.0000 | ORAL_TABLET | Freq: Two times a day (BID) | ORAL | Status: DC
  Filled 2013-01-07 (×2): qty 1

## 2013-01-07 MED ORDER — SULFAMETHOXAZOLE-TMP DS 800-160 MG PO TABS
1.0000 | ORAL_TABLET | Freq: Two times a day (BID) | ORAL | Status: DC
  Administered 2013-01-07 – 2013-01-08 (×3): 1 via ORAL
  Filled 2013-01-07 (×4): qty 1

## 2013-01-07 NOTE — Progress Notes (Signed)
Patient ID: [name] Mccoy, male   DOB: Dec 08, 1960, 52 y.o.   MRN: [id number]  We are currently working on scheduling the right percutaneous nephrolithotomy and don't have a date yet.  He can be discharged when ready and be brought back for the procedure if necessary.

## 2013-01-07 NOTE — Progress Notes (Signed)
Subjective: Patient states he is doing ok, no Right PCN pain. Last evening temp again. Tmax 102.71F, sinus tachycardic  Objective: Physical Exam: BP 135/78  Pulse 130  Temp(Src) 97.7 F (36.5 C) (Axillary)  Resp 20  Ht 6\' 3"  (1.905 m)  Wt 138 lb 14.4 oz (63.005 kg)  BMI 17.36 kg/m2  SpO2 100%  Right PCN 47F site C/D/I, no signs of bleeding, infection, output 1455cc (1600cc), clear yellow   Labs: CBC  Recent Labs  01/06/13 0340 01/07/13 0405  WBC 13.3* 11.1*  HGB 10.5* 9.1*  HCT 33.3* 29.0*  PLT 261 281   BMET  Recent Labs  01/06/13 0340 01/07/13 0405  NA 140 140  K 4.7 3.1*  CL 109 108  CO2 22 23  GLUCOSE 94 105*  BUN 19 16  CREATININE 0.80 0.71  CALCIUM 8.2* 8.0*   LFT No results found for this basename: PROT, ALBUMIN, AST, ALT, ALKPHOS, BILITOT, BILIDIR, IBILI, LIPASE,  in the last 72 hours PT/INR No results found for this basename: LABPROT, INR,  in the last 72 hours   Studies/Results: No results found.  Assessment/Plan: Right obstructing renal calculi, hydronephrosis.  Urosepsis s/p Right PCN 8/18 draining well 1455cc (1600cc) last 24 hrs.  Cr. 0.71 (0.80), Urine Cx (+), Blood Cx (+) on antibiotics, Wbc 11.1 (13.3) Continue to flush PCN and monitor output.    LOS: 4 days    Cloretta Ned 01/07/2013 11:53 AM

## 2013-01-07 NOTE — Progress Notes (Signed)
Patient ID: [name] Mccoy, male   DOB: 1961-02-07, 52 y.o.   MRN: [id number]   I now have [name] scheduled for his right PCNL on 01/25/13 at 12:15 pm.

## 2013-01-07 NOTE — Progress Notes (Signed)
[facility] PROGRESS NOTE  Assessment/Plan: Septic shock: - Septic shock that required pressor, for 24 hr. - resolved, due to PROVIDENCIA STUARTII sensitive to bactrim. - d/c rocephin. Started bactrim.  Renal calculus, right: - 1.3 cm R stone with mild to moderate hydronephrosis on CT.  - IR consult > placement of percutaneous nephrostomy drain placed on 8/18 - S/p right percutaneous nephrostomy; will need nephrolithotomy with antegrade ureteroscopy  - follow up with urology.  UTI (urinary tract infection) - UC PROVIDENCIA STUARTII sensitive to bactrim. - HAd fever on 8.21.2014, cont to observe. - leukocytosis improved.  Multiple sclerosis: -continue home medications  -PRN Vicodin for pain  Pulmonary Nodule  - incidental finding on CT ABD  - Will need outpatient follow up for surveillance of nodule  Diarrhea - resolved.   Code Status: full Family Communication: friend  Disposition Plan: inpatient   Consultants:  PCCM  Urology  Procedures:  Percutaneous nephrostomy  Antibiotics:  Bactrim  HPI/Subjective: Feels better no complains  Objective: Filed Vitals:   01/06/13 2148 01/07/13 0230 01/07/13 0500 01/07/13 0646  BP: 113/64   135/78  Pulse: 125   130  Temp: 102.7 F (39.3 C) 98.4 F (36.9 C)  100.2 F (37.9 C)  TempSrc: Oral Oral  Oral  Resp: 20   20  Height:      Weight:   63.005 kg (138 lb 14.4 oz)   SpO2: 100%   100%    Intake/Output Summary (Last 24 hours) at 01/07/13 1034 Last data filed at 01/07/13 0700  Gross per 24 hour  Intake   1340 ml  Output   4280 ml  Net  -2940 ml   Filed Weights   01/06/13 0335 01/06/13 0500 01/07/13 0500  Weight: 66 kg (145 lb 8.1 oz) 66 kg (145 lb 8.1 oz) 63.005 kg (138 lb 14.4 oz)    Exam:  General: Alert, awake, oriented x3, in no acute distress.  HEENT: No bruits, no goiter.  Heart: Regular rate and rhythm, without murmurs, rubs, gallops.  Lungs: Good air movement, bilateral air movement.   Abdomen: Soft, nontender, nondistended, positive bowel sounds.     Data Reviewed: Basic Metabolic Panel:  Recent Labs Lab 01/04/13 0530 01/04/13 1546 01/05/13 0343 01/06/13 0340 01/07/13 0405  NA 146* 144 142 140 140  K 2.5* 4.2 3.2* 4.7 3.1*  CL 115* 116* 113* 109 108  CO2 18* 16* 19 22 23   GLUCOSE 113* 169* 167* 94 105*  BUN 42* 39* 31* 19 16  CREATININE 1.51* 1.21 0.94 0.80 0.71  CALCIUM 7.9* 8.1* 8.2* 8.2* 8.0*  MG  --   --  1.7 1.5  --    Liver Function Tests:  Recent Labs Lab 01/03/13 0441  AST 28  ALT 21  ALKPHOS 106  BILITOT 0.4  PROT 7.7  ALBUMIN 2.2*   No results found for this basename: LIPASE, AMYLASE,  in the last 168 hours No results found for this basename: AMMONIA,  in the last 168 hours CBC:  Recent Labs Lab 01/03/13 0441 01/04/13 0530 01/05/13 0343 01/06/13 0340 01/07/13 0405  WBC 11.1* 21.3* 15.1* 13.3* 11.1*  HGB 9.8* 8.9* 8.7* 10.5* 9.1*  HCT 29.8* 27.5* 27.5* 33.3* 29.0*  MCV 74.7* 75.5* 75.8* 77.3* 76.9*  PLT 360 287 267 261 281   Cardiac Enzymes:  Recent Labs Lab 01/03/13 0441  TROPONINI <0.30   BNP (last 3 results) No results found for this basename: PROBNP,  in the last 8760 hours CBG: No results found  for this basename: GLUCAP,  in the last 168 hours  Recent Results (from the past 240 hour(s))  CULTURE, BLOOD (ROUTINE X 2)     Status: None   Collection Time    01/03/13  7:41 AM      Result Value Range Status   Specimen Description BLOOD NECK   Final   Special Requests BOTTLES DRAWN AEROBIC AND ANAEROBIC [facility] EACH   Final   Culture  Setup Time     Final   Value: 01/03/2013 11:02     Performed at [facility]   Culture     Final   Value: PROVIDENCIA STUARTII     Note: Gram Stain Report Called to,Read Back By and Verified With:  MEGAN STOCKS AT 0100A ON [id number] BY BRMEL     Performed at [facility]   Report Status 01/06/2013 FINAL   Final   Organism ID, Bacteria PROVIDENCIA STUARTII   Final   CULTURE, BLOOD (ROUTINE X 2)     Status: None   Collection Time    01/03/13  7:46 AM      Result Value Range Status   Specimen Description BLOOD RIGHT HAND   Final   Special Requests BOTTLES DRAWN AEROBIC AND ANAEROBIC 2 CC EACH   Final   Culture  Setup Time     Final   Value: 01/03/2013 11:02     Performed at [facility]   Culture     Final   Value: PROVIDENCIA STUARTII     Note: SUSCEPTIBILITIES PERFORMED ON PREVIOUS CULTURE WITHIN THE LAST 5 DAYS.     Note: Gram Stain Report Called to,Read Back By and Verified With:  MEGAN STOCKS ON [id number] AT 0144A BY BRMEL     Performed at [facility]   Report Status 01/06/2013 FINAL   Final  URINE CULTURE     Status: None   Collection Time    01/03/13  8:17 AM      Result Value Range Status   Specimen Description URINE, CATHETERIZED   Final   Special Requests NONE   Final   Culture  Setup Time     Final   Value: 01/03/2013 11:02     Performed at [facility] Count     Final   Value: >=100,000 COLONIES/ML     Performed at [facility]   Culture     Final   Value: PROVIDENCIA STUARTII     Performed at [facility]   Report Status 01/04/2013 FINAL   Final   Organism ID, Bacteria PROVIDENCIA STUARTII   Final  MRSA PCR SCREENING     Status: Abnormal   Collection Time    01/03/13 12:56 PM      Result Value Range Status   MRSA by PCR POSITIVE (*) NEGATIVE Final   Comment:            The GeneXpert MRSA Assay (FDA     approved for NASAL specimens     only), is one component of a     comprehensive MRSA colonization     surveillance program. It is not     intended to diagnose MRSA     infection nor to guide or     monitor treatment for     MRSA infections.     RESULT CALLED TO, READ BACK BY AND VERIFIED WITH:     Jolayne Panther RN 16:00 01/03/13 ([email or web address])     Performed  at [facility]     Studies: No results found.  Scheduled Meds: . amantadine  100 mg Oral BID  . cefTRIAXone  (ROCEPHIN)  IV  1 g Intravenous Q24H  . Chlorhexidine Gluconate Cloth  6 each Topical Q0600  . dantrolene  25 mg Oral TID  . feeding supplement  237 mL Oral TID BM  . gabapentin  600 mg Oral QID  . heparin  5,000 Units Subcutaneous Q8H  . multivitamin with minerals  1 tablet Oral Daily  . mupirocin ointment  1 application Nasal BID  . nutrition supplement  1 packet Oral BID BM  . tiZANidine  4 mg Oral TID PC & HS   Continuous Infusions: . sodium chloride 50 mL/hr at 01/06/13 2219     Marinda Elk  [facility] Pager [phone]. If 8PM-8AM, please contact night-coverage at [email or web address], password [facility] 01/07/2013, 10:34 AM  LOS: 4 days

## 2013-01-08 DIAGNOSIS — R509 Fever, unspecified: Secondary | ICD-10-CM

## 2013-01-08 MED ORDER — POTASSIUM CHLORIDE CRYS ER 20 MEQ PO TBCR
40.0000 meq | EXTENDED_RELEASE_TABLET | Freq: Two times a day (BID) | ORAL | Status: DC
  Administered 2013-01-08: 40 meq via ORAL
  Filled 2013-01-08 (×2): qty 2

## 2013-01-08 MED ORDER — SULFAMETHOXAZOLE-TMP DS 800-160 MG PO TABS: 1.0000 | ORAL_TABLET | Freq: Two times a day (BID) | ORAL | Status: DC

## 2013-01-08 NOTE — Progress Notes (Signed)
[facility] PROGRESS NOTE  Assessment/Plan: Septic shock: - Septic shock that required pressor, for 24 hr. - resolved, due to PROVIDENCIA STUARTII sensitive to bactrim. - Started bactrim 8.22.2014, has remained afebrile..  Renal calculus, right: - 1.3 cm R stone with mild to moderate hydronephrosis on CT.  - IR consult > placement of percutaneous nephrostomy drain placed on 8/18 - S/p right percutaneous nephrostomy; will need nephrolithotomy with antegrade ureteroscopy  - Follow up with urology as an outpatient for right percutaneous nephrolithotomy  UTI (urinary tract infection) - UC PROVIDENCIA STUARTII sensitive to bactrim. - fever resolved with in 24hrs - leukocytosis improved.  Multiple sclerosis: -continue home medications  -PRN Vicodin for pain  Pulmonary Nodule  - incidental finding on CT ABD  - Will need outpatient follow up for surveillance of nodule. - follow up with PCP.  Diarrhea  - resolved.   Code Status: full Family Communication: friend  Disposition Plan: inpatient   Consultants:  PCCM  Urology  Procedures:  Percutaneous nephrostomy  Antibiotics:  Bactrim  HPI/Subjective: Feels better no complains  Objective: Filed Vitals:   01/07/13 1411 01/07/13 2120 01/08/13 0500 01/08/13 0646  BP: 105/66 109/68 115/70   Pulse: 110 112 118   Temp: 98.3 F (36.8 C) 98 F (36.7 C) 98.9 F (37.2 C)   TempSrc: Oral Oral Oral   Resp: 20 20 20    Height:      Weight:    63.2 kg (139 lb 5.3 oz)  SpO2: 98% 98% 100%     Intake/Output Summary (Last 24 hours) at 01/08/13 0818 Last data filed at 01/08/13 0500  Gross per 24 hour  Intake 1013.33 ml  Output   3150 ml  Net -2136.67 ml   Filed Weights   01/06/13 0500 01/07/13 0500 01/08/13 0646  Weight: 66 kg (145 lb 8.1 oz) 63.005 kg (138 lb 14.4 oz) 63.2 kg (139 lb 5.3 oz)    Exam:  General: Alert, awake, oriented x3, in no acute distress.  HEENT: No bruits, no goiter.  Heart: Regular  rate and rhythm, without murmurs, rubs, gallops.  Lungs: Good air movement, bilateral air movement.  Abdomen: Soft, nontender, nondistended, positive bowel sounds.     Data Reviewed: Basic Metabolic Panel:  Recent Labs Lab 01/04/13 0530 01/04/13 1546 01/05/13 0343 01/06/13 0340 01/07/13 0405  NA 146* 144 142 140 140  K 2.5* 4.2 3.2* 4.7 3.1*  CL 115* 116* 113* 109 108  CO2 18* 16* 19 22 23   GLUCOSE 113* 169* 167* 94 105*  BUN 42* 39* 31* 19 16  CREATININE 1.51* 1.21 0.94 0.80 0.71  CALCIUM 7.9* 8.1* 8.2* 8.2* 8.0*  MG  --   --  1.7 1.5  --    Liver Function Tests:  Recent Labs Lab 01/03/13 0441  AST 28  ALT 21  ALKPHOS 106  BILITOT 0.4  PROT 7.7  ALBUMIN 2.2*   No results found for this basename: LIPASE, AMYLASE,  in the last 168 hours No results found for this basename: AMMONIA,  in the last 168 hours CBC:  Recent Labs Lab 01/03/13 0441 01/04/13 0530 01/05/13 0343 01/06/13 0340 01/07/13 0405  WBC 11.1* 21.3* 15.1* 13.3* 11.1*  HGB 9.8* 8.9* 8.7* 10.5* 9.1*  HCT 29.8* 27.5* 27.5* 33.3* 29.0*  MCV 74.7* 75.5* 75.8* 77.3* 76.9*  PLT 360 287 267 261 281   Cardiac Enzymes:  Recent Labs Lab 01/03/13 0441  TROPONINI <0.30   BNP (last 3 results) No results found for this basename: PROBNP,  in the last 8760 hours CBG: No results found for this basename: GLUCAP,  in the last 168 hours  Recent Results (from the past 240 hour(s))  CULTURE, BLOOD (ROUTINE X 2)     Status: None   Collection Time    01/03/13  7:41 AM      Result Value Range Status   Specimen Description BLOOD NECK   Final   Special Requests BOTTLES DRAWN AEROBIC AND ANAEROBIC [facility] EACH   Final   Culture  Setup Time     Final   Value: 01/03/2013 11:02     Performed at [facility]   Culture     Final   Value: PROVIDENCIA STUARTII     Note: Gram Stain Report Called to,Read Back By and Verified With:  MEGAN STOCKS AT 0100A ON [id number] BY BRMEL     Performed at [facility]    Report Status 01/06/2013 FINAL   Final   Organism ID, Bacteria PROVIDENCIA STUARTII   Final  CULTURE, BLOOD (ROUTINE X 2)     Status: None   Collection Time    01/03/13  7:46 AM      Result Value Range Status   Specimen Description BLOOD RIGHT HAND   Final   Special Requests BOTTLES DRAWN AEROBIC AND ANAEROBIC 2 CC EACH   Final   Culture  Setup Time     Final   Value: 01/03/2013 11:02     Performed at [facility]   Culture     Final   Value: PROVIDENCIA STUARTII     Note: SUSCEPTIBILITIES PERFORMED ON PREVIOUS CULTURE WITHIN THE LAST 5 DAYS.     Note: Gram Stain Report Called to,Read Back By and Verified With:  MEGAN STOCKS ON [id number] AT 0144A BY BRMEL     Performed at [facility]   Report Status 01/06/2013 FINAL   Final  URINE CULTURE     Status: None   Collection Time    01/03/13  8:17 AM      Result Value Range Status   Specimen Description URINE, CATHETERIZED   Final   Special Requests NONE   Final   Culture  Setup Time     Final   Value: 01/03/2013 11:02     Performed at [facility] Count     Final   Value: >=100,000 COLONIES/ML     Performed at [facility]   Culture     Final   Value: PROVIDENCIA STUARTII     Performed at [facility]   Report Status 01/04/2013 FINAL   Final   Organism ID, Bacteria PROVIDENCIA STUARTII   Final  MRSA PCR SCREENING     Status: Abnormal   Collection Time    01/03/13 12:56 PM      Result Value Range Status   MRSA by PCR POSITIVE (*) NEGATIVE Final   Comment:            The GeneXpert MRSA Assay (FDA     approved for NASAL specimens     only), is one component of a     comprehensive MRSA colonization     surveillance program. It is not     intended to diagnose MRSA     infection nor to guide or     monitor treatment for     MRSA infections.     RESULT CALLED TO, READ BACK BY AND VERIFIED WITH:     R. Laural Benes  RN 16:00 01/03/13 ([email or web address])     Performed at [facility]      Studies: No results found.  Scheduled Meds: . amantadine  100 mg Oral BID  . Chlorhexidine Gluconate Cloth  6 each Topical Q0600  . dantrolene  25 mg Oral TID  . feeding supplement  237 mL Oral TID BM  . gabapentin  600 mg Oral QID  . heparin  5,000 Units Subcutaneous Q8H  . multivitamin with minerals  1 tablet Oral Daily  . mupirocin ointment  1 application Nasal BID  . nutrition supplement  1 packet Oral BID BM  . potassium chloride  40 mEq Oral BID  . sulfamethoxazole-trimethoprim  1 tablet Oral Q12H  . tiZANidine  4 mg Oral TID PC & HS   Continuous Infusions: . sodium chloride 50 mL/hr at 01/07/13 1714     Marinda Elk  [facility] Pager [phone]. If 8PM-8AM, please contact night-coverage at [email or web address], password [facility] 01/08/2013, 8:18 AM  LOS: 5 days

## 2013-01-08 NOTE — Discharge Summary (Signed)
Physician Discharge Summary  [name] Mccoy ZOX:[id number] DOB: Feb 07, 1961 DOA: 01/03/2013  PCP: Londell Moh, MD  Admit date: 01/03/2013 Discharge date: 01/08/2013  Time spent: 40 minutes  Recommendations for Outpatient Follow-up:  Follow up with Urology as an outpatient for nephrolithotomy and stone removal.   Discharge Diagnoses:  Active Problems:   Multiple sclerosis   UTI (urinary tract infection)   Septic shock   Renal calculus, right   Discharge Condition: stable  Diet recommendation: regular  Filed Weights   01/06/13 0500 01/07/13 0500 01/08/13 0646  Weight: 66 kg (145 lb 8.1 oz) 63.005 kg (138 lb 14.4 oz) 63.2 kg (139 lb 5.3 oz)   History of present illness:  [name] with PMH of MS, pressure ulcers requiring debridement, frequent admits within previous 3 months for UTI's who presented to [facility] ER with caregiver reporting 2 days of chills that progressed to fevers / sweating, weakness / fatigue from rigors, altered mental status / hallucinations. He also has had decreased PO intake and one day of diarrhea. Caregiver reports pt was sent home in July from a SNF for rehab with a foley catheter. It has been changed once monthly since. She noted a change in the urine quality 1 wk prior to admit - darker with more sediment and that the urine had been leaking from the meatus despite the catheter. He has a large right hip and sacral decubitus ulcer that are cared for by Dr. Norma Fredrickson and his home caregiver. No new noted odor or drainage from wounds.  ER work up noted a lactic acid of 2.6, WBC 11.1, fever of 104.9, mild anemia, clear cxr, urine positive for UTI and hypotension. CT of abd with R obs urinary stone. Patient & caregiver report his normal blood pressure is low (last visit at [facility] notes BP of 106/59). He reports he has been worked up by Dr. Sandria Manly for hypotension & tachycardia. Patient denies burning, urgency but has noted a "pressure" in his lower abdomen.   Hospital  Course:  Septic shock:  - Septic shock that required pressor, for 24 hr.  Initially admitted to PCCM. - Resolved with emprica zosyn. UC grew PROVIDENCIA STUARTII sensitive to bactrim. - Started bactrim 8.22.2014, has remained afebrile and leukocytosis resolved.  Renal calculus, right:  - 1.3 cm R stone with mild to moderate hydronephrosis on CT.  - IR consult > placement of percutaneous nephrostomy drain placed on 8/18. Urology was consulted. - S/p right percutaneous nephrostomy; will need nephrolithotomy with antegrade ureteroscopy  - Follow up with urology as an outpatient for right percutaneous nephrolithotomy   UTI (urinary tract infection)  - UC PROVIDENCIA STUARTII sensitive to bactrim.  - fever resolved with in 24hrs  - leukocytosis improved.   Multiple sclerosis:  -continue home medications  -PRN Vicodin for pain   Pulmonary Nodule  - incidental finding on CT ABD  - Will need outpatient follow up for surveillance of nodule.  - follow up with PCP.   Diarrhea  - resolved.  Consultants:  PCCM  Urology Procedures:  Percutaneous nephrostomy   Discharge Exam: Filed Vitals:   01/08/13 0500  BP: 115/70  Pulse: 118  Temp: 98.9 F (37.2 C)  Resp: 20    General: A&O x3 Cardiovascular: RRR Respiratory: good air movement CTA B/L  Discharge Instructions      Discharge Orders   Future Appointments Provider Department Dept Phone   01/31/2013 11:30 AM Suanne Marker, MD [facility] [phone]   Future Orders  Complete By Expires   Diet - low sodium heart healthy  As directed    Increase activity slowly  As directed        Medication List         amantadine 100 MG capsule  Commonly known as:  SYMMETREL  Take 100 mg by mouth 2 (two) times daily.     collagenase ointment  Commonly known as:  SANTYL  Apply topically daily. Apply to Unstageable pressure ulcer on coccyx once daily.  Apply 1/8 inch thick and top with saline dressing.   Change daily and PRN for soiling or loosening of dressing x 21 days.     dantrolene 25 MG capsule  Commonly known as:  DANTRIUM  Take 25 mg by mouth 3 (three) times daily.     ezetimibe 10 MG tablet  Commonly known as:  ZETIA  Take 10 mg by mouth daily.     feeding supplement Liqd  Take 237 mLs by mouth 3 (three) times daily between meals.     nutrition supplement Pack  Take 1 packet by mouth 2 (two) times daily after a meal.     gabapentin 600 MG tablet  Commonly known as:  NEURONTIN  Take 600 mg by mouth 4 (four) times daily.     HYDROcodone-acetaminophen 5-325 MG per tablet  Commonly known as:  NORCO/VICODIN  Take 1 tablet every 8 hours as needed for pain(1-5) Take 2 tablets every 8 hours as needed for pain (6-10).     Interferon Beta-1b 0.3 MG Kit injection  Commonly known as:  BETASERON  Inject 0.25 mg into the skin every other day.     modafinil 200 MG tablet  Commonly known as:  PROVIGIL  Take 200 mg by mouth 2 (two) times daily.     multivitamin with minerals Tabs tablet  Take 1 tablet by mouth daily.     sulfamethoxazole-trimethoprim 800-160 MG per tablet  Commonly known as:  BACTRIM DS  Take 1 tablet by mouth every 12 (twelve) hours.     tiZANidine 4 MG tablet  Commonly known as:  ZANAFLEX  Take 4 mg by mouth 4 (four) times daily - after meals and at bedtime.       No Known Allergies Follow-up Information   Follow up with Anner Crete, MD.   Specialty:  Urology   Contact information:   [address] [place] [address] [phone]       Follow up with Londell Moh, MD. (repeat CXr to follow up on lung nodule)    Specialty:  Internal Medicine   Contact information:   [address] [place] [address] [phone]        The results of significant diagnostics from this hospitalization (including imaging, microbiology, ancillary and laboratory) are listed below for reference.    Significant  Diagnostic Studies: Ct Abdomen Pelvis Wo Contrast  01/03/2013   *RADIOLOGY REPORT*  Clinical Data: Generalized weakness.  Question UTI.  Lethargic. Bed sores.  Sore throat.  Multiple sclerosis.  CT ABDOMEN AND PELVIS WITHOUT CONTRAST  Technique:  Multidetector CT imaging of the abdomen and pelvis was performed following the standard protocol without intravenous contrast.  Comparison: 07/28/2011.  Findings: Proximal right ureteral 1.3 cm obstructing stone with mild to moderate right-sided hydronephrosis.  Several nonobstructing right renal calculi noted.  Small nonobstructing left lower pole renal calculi.  There is fullness of the left renal collecting system.  No definitive left ureteral obstructing stone.  Fullness  of the collecting systems may be partially explained by bladder outlet obstruction as the bladder is prominent in size. Calcification along the posterior aspect of the bladder wall may reflect changes of chronic distension/inflammation.  Right lung base 3.7 mm nodule (series 4 image 7) not previously imaged. Given risk factors for bronchogenic carcinoma, follow-up chest CT at 1 year is recommended.  This recommendation follows the consensus statement:  Guidelines for Management of Small Pulmonary Nodules Detected on CT Scans:  A Statement from the Fleischner Society as published in Radiology 2005; 237:395-400.  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  No CT evidence of pericholecystic inflammation.  Ultrasound can be obtained for further delineation if this is of clinical concern.  Circumferential thickening rectosigmoid region may be related to under distension.  Inflammation not excluded.  No free intraperitoneal air.  Evaluation of solid abdominal viscera is limited by lack of IV contrast.  Taking this limitation into account:  No worrisome hepatic, splenic, adrenal or pancreatic lesion.  Left femoral neck fracture with  fragmentation of the left femoral head and acetabulum.  This is new from the prior exam.  Subluxed/partially dislocated right femoral head with impaction against the posterior acetabulum.  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  Sacral decubiti with sclerotic appearance of the left ilium new from prior examination.  Osteomyelitis is a possibility.  IMPRESSION:  Proximal right ureteral 1.3 cm obstructing stone with mild to moderate right-sided hydronephrosis.  Several nonobstructing right renal calculi noted.  Small nonobstructing left lower pole renal calculi.  There is fullness of the left renal collecting system.  No definitive left ureteral obstructing stone.  Fullness of the collecting systems may be partially explained by bladder outlet obstruction as the bladder is prominent in size. Calcification along the posterior aspect of the bladder wall may reflect changes of chronic distension/inflammation.  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  No CT evidence of pericholecystic inflammation.  Ultrasound can be obtained for further delineation if this is of clinical concern.  Circumferential thickening rectosigmoid region may be related to under distension.  Inflammation not excluded.  No free intraperitoneal air.  Left femoral neck fracture with fragmentation of the left femoral head and acetabulum.  This is new from the prior exam.  Subluxed/partially dislocated right femoral head with impaction against the posterior acetabulum.  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  Sacral decubiti with sclerotic appearance of the left ilium new from prior examination.  Osteomyelitis is a possibility.  Right lung base 3.7 mm nodule (series 4 image 7) not previously imaged. Follow up as noted above.   Original Report Authenticated By: Lacy Duverney, M.D.   US Renal  01/03/2013   *RADIOLOGY REPORT*  Clinical Data:  Renal insufficiency  RENAL / URINARY TRACT ULTRASOUND  Technique:  Complete ultrasound exam of the kidneys and urinary bladder was performed.  Comparison: CT scan from 01/03/2013  Findings:  The right kidney measures 13.3  cm in long axis.  The left kidney measures 12.8 cm.  Mild to moderate left hydronephrosis is evident and while comparing across modalities is difficult, the degree of collecting system distention appears to be roughly similar to the previous CT scan. At least two stones in the 5-6 mm size range are seen in the left kidney towards the lower pole.  There is mild to moderate right-sided hydronephrosis with multiple right renal stones evident.  Images in the  central anatomic pelvis reveal a distended bladder with posterior bladder wall thickening and intraluminal debris.  Incidental imaging of the gallbladder shows multiple gallstones.  Impression:  Bilateral mild to moderate hydronephrosis with bilateral renal stones.  There is seen to be some associated dilatation of the proximal right ureter although an etiology for the bilateral hydronephrosis is not evident on today's study.  The patient has multiple bilateral renal stones and the obstruction may be secondary to the stone disease.  Posterior bladder wall thickening with debris in the urinary bladder.  Bladder infection would be a consideration.   Original Report Authenticated By: Kennith Center, M.D.   Ir Perc Nephrostomy Right  01/03/2013   *RADIOLOGY REPORT*  Clinical Data:  Urosepsis.  Obstructing right proximal ureteral calculus with hydronephrosis.  RIGHT PERCUTANEOUS NEPHROSTOMY CATHETER PLACEMENT UNDER ULTRASOUND AND FLUOROSCOPIC GUIDANCE  Technique: The procedure, risks (including but not limited to bleeding, infection, organ damage), benefits, and alternatives were explained to the patient.  Questions regarding the procedure were encouraged and answered.  The patient understands and consents to the procedure. The rightflank region  prepped with Betadine, draped in usual sterile fashion, infiltrated locally with 1% lidocaine.The patient was receiving adequate antibiotic prophylactic coverage.  Intravenous Fentanyl and Versed were administered as conscious sedation during continuous cardiorespiratory monitoring by the radiology RN, with a total moderate sedation time of in less than 30 minutes.   Under real-time ultrasound guidance, a 21-gauge trocar needle was advanced into a posterior lower pole calyx. Ultrasound image documentation was saved. No definite to urine could be aspirated. The collecting system was not significantly hydronephrotic. Therefore, under fluoroscopy a 21-gauge micropuncture needle was advanced towards a posterior lower pole radiodense calculus.  A 018 guide wire advanced easily within the renal collecting system. Needle was exchanged over  guidewire for transitional dilator. Contrast injection confirmed appropriate positioning. Catheter was exchanged over a guidewire for a 10 French pigtail catheter, formed centrally within the right renal collecting system. Contrast injection confirms appropriate positioning and patency. Catheter secured externally with 0 Prolene suture and placed to external drain bag. No immediate complication.  Fluoroscopy time: 2 minutes 6seconds  IMPRESSION Technically successful right percutaneous nephrostomy catheter placement.   Original Report Authenticated By: D. Andria Rhein, MD   Ir US Guide Bx Asp/drain  01/05/2013   *RADIOLOGY REPORT*  Clinical Data:  Urosepsis.  Obstructing right proximal ureteral calculus with hydronephrosis.  RIGHT PERCUTANEOUS NEPHROSTOMY CATHETER PLACEMENT UNDER ULTRASOUND AND FLUOROSCOPIC GUIDANCE  Technique: The procedure, risks (including but not limited to bleeding, infection, organ damage), benefits, and alternatives were explained to the patient.  Questions regarding the procedure were encouraged and answered.  The patient understands and consents to the  procedure. The rightflank region prepped with Betadine, draped in usual sterile fashion, infiltrated locally with 1% lidocaine.The patient was receiving adequate antibiotic prophylactic coverage.  Intravenous Fentanyl and Versed were administered as conscious sedation during continuous cardiorespiratory monitoring by the radiology RN, with a total moderate sedation time of in less than 30 minutes.   Under real-time ultrasound guidance, a 21-gauge trocar needle was advanced into a posterior lower pole calyx. Ultrasound image documentation was saved. No definite to urine could be aspirated. The collecting system was not significantly hydronephrotic. Therefore, under fluoroscopy a 21-gauge micropuncture needle was advanced towards a posterior lower pole radiodense calculus.  A 018 guide wire advanced easily within the renal collecting system. Needle was exchanged over  guidewire for transitional dilator. Contrast injection confirmed appropriate positioning. Catheter was exchanged over a  guidewire for a 10 French pigtail catheter, formed centrally within the right renal collecting system. Contrast injection confirms appropriate positioning and patency. Catheter secured externally with 0 Prolene suture and placed to external drain bag. No immediate complication.  Fluoroscopy time: 2 minutes 6seconds  IMPRESSION Technically successful right percutaneous nephrostomy catheter placement.   Original Report Authenticated By: D. Andria Rhein, MD   Dg Chest Portable 1 View  01/03/2013   *RADIOLOGY REPORT*  Clinical Data: Fever, dizziness and weakness.  PORTABLE CHEST - 1 VIEW  Comparison: Single view of the chest 10/12/2012 and 09/15/2012.  Findings: Again seen is right upper lobe scarring.  The lungs appear emphysematous.  No pneumothorax or pleural effusion is identified.  Heart size is normal.  IMPRESSION:  1.  No acute finding. 2.  Pulmonary hyperinflation and right upper lobe scarring, unchanged.   Original Report  Authenticated By: Holley Dexter, M.D.    Microbiology: Recent Results (from the past 240 hour(s))  CULTURE, BLOOD (ROUTINE X 2)     Status: None   Collection Time    01/03/13  7:41 AM      Result Value Range Status   Specimen Description BLOOD NECK   Final   Special Requests BOTTLES DRAWN AEROBIC AND ANAEROBIC [facility] EACH   Final   Culture  Setup Time     Final   Value: 01/03/2013 11:02     Performed at [facility]   Culture     Final   Value: PROVIDENCIA STUARTII     Note: Gram Stain Report Called to,Read Back By and Verified With:  MEGAN STOCKS AT 0100A ON [id number] BY BRMEL     Performed at [facility]   Report Status 01/06/2013 FINAL   Final   Organism ID, Bacteria PROVIDENCIA STUARTII   Final  CULTURE, BLOOD (ROUTINE X 2)     Status: None   Collection Time    01/03/13  7:46 AM      Result Value Range Status   Specimen Description BLOOD RIGHT HAND   Final   Special Requests BOTTLES DRAWN AEROBIC AND ANAEROBIC 2 CC EACH   Final   Culture  Setup Time     Final   Value: 01/03/2013 11:02     Performed at [facility]   Culture     Final   Value: PROVIDENCIA STUARTII     Note: SUSCEPTIBILITIES PERFORMED ON PREVIOUS CULTURE WITHIN THE LAST 5 DAYS.     Note: Gram Stain Report Called to,Read Back By and Verified With:  Wonda Horner ON [id number] AT 0144A BY BRMEL     Performed at [facility]   Report Status 01/06/2013 FINAL   Final  URINE CULTURE     Status: None   Collection Time    01/03/13  8:17 AM      Result Value Range Status   Specimen Description URINE, CATHETERIZED   Final   Special Requests NONE   Final   Culture  Setup Time     Final   Value: 01/03/2013 11:02     Performed at [facility] Count     Final   Value: >=100,000 COLONIES/ML     Performed at [facility]   Culture     Final   Value: PROVIDENCIA STUARTII     Performed at [facility]   Report Status 01/04/2013 FINAL   Final   Organism  ID, Bacteria PROVIDENCIA STUARTII   Final  MRSA  PCR SCREENING     Status: Abnormal   Collection Time    01/03/13 12:56 PM      Result Value Range Status   MRSA by PCR POSITIVE (*) NEGATIVE Final   Comment:            The GeneXpert MRSA Assay (FDA     approved for NASAL specimens     only), is one component of a     comprehensive MRSA colonization     surveillance program. It is not     intended to diagnose MRSA     infection nor to guide or     monitor treatment for     MRSA infections.     RESULT CALLED TO, READ BACK BY AND VERIFIED WITH:     Jolayne Panther RN 16:00 01/03/13 ([email or web address])     Performed at [facility]     Labs: Basic Metabolic Panel:  Recent Labs Lab 01/04/13 0530 01/04/13 1546 01/05/13 0343 01/06/13 0340 01/07/13 0405  NA 146* 144 142 140 140  K 2.5* 4.2 3.2* 4.7 3.1*  CL 115* 116* 113* 109 108  CO2 18* 16* 19 22 23   GLUCOSE 113* 169* 167* 94 105*  BUN 42* 39* 31* 19 16  CREATININE 1.51* 1.21 0.94 0.80 0.71  CALCIUM 7.9* 8.1* 8.2* 8.2* 8.0*  MG  --   --  1.7 1.5  --    Liver Function Tests:  Recent Labs Lab 01/03/13 0441  AST 28  ALT 21  ALKPHOS 106  BILITOT 0.4  PROT 7.7  ALBUMIN 2.2*   No results found for this basename: LIPASE, AMYLASE,  in the last 168 hours No results found for this basename: AMMONIA,  in the last 168 hours CBC:  Recent Labs Lab 01/03/13 0441 01/04/13 0530 01/05/13 0343 01/06/13 0340 01/07/13 0405  WBC 11.1* 21.3* 15.1* 13.3* 11.1*  HGB 9.8* 8.9* 8.7* 10.5* 9.1*  HCT 29.8* 27.5* 27.5* 33.3* 29.0*  MCV 74.7* 75.5* 75.8* 77.3* 76.9*  PLT 360 287 267 261 281   Cardiac Enzymes:  Recent Labs Lab 01/03/13 0441  TROPONINI <0.30   BNP: BNP (last 3 results) No results found for this basename: PROBNP,  in the last 8760 hours CBG: No results found for this basename: GLUCAP,  in the last 168 hours     Signed:  Marinda Elk  [facility] 01/08/2013, 8:40 AM

## 2013-01-10 ENCOUNTER — Encounter (HOSPITAL_COMMUNITY): Payer: Self-pay | Admitting: Pharmacy Technician

## 2013-01-12 ENCOUNTER — Telehealth: Payer: Self-pay

## 2013-01-12 ENCOUNTER — Other Ambulatory Visit: Payer: Self-pay | Admitting: Urology

## 2013-01-12 NOTE — Telephone Encounter (Signed)
[facility] called, left a mesage stating the patients Betaser[employer] requires a prior auth.  Phone number given was [phone] (which is the incorrect number) and ID # [id number].  I have submitted info to the ins we have on file, pending response.

## 2013-01-13 ENCOUNTER — Encounter (HOSPITAL_COMMUNITY): Payer: Self-pay | Admitting: *Deleted

## 2013-01-13 ENCOUNTER — Other Ambulatory Visit: Payer: Self-pay | Admitting: Internal Medicine

## 2013-01-13 DIAGNOSIS — R339 Retention of urine, unspecified: Secondary | ICD-10-CM

## 2013-01-13 HISTORY — DX: Retention of urine, unspecified: R33.9

## 2013-01-13 NOTE — Progress Notes (Signed)
PST PHONE INTERVIEW-    Patient states only open sore is pressure sore on buttocks- states has been told looks like is healing- states dressing is covering this.   RIGHT PERCUTANEOUS TUBE PLACED 01/05/13, states has indwelling foley catheter, OV [facility] 1/14, 3/14 ON CHART.  EKG 01/04/13- SINUS TACH-  Previous EKK 5/14, 3/13 Tachycardia

## 2013-01-19 ENCOUNTER — Encounter (HOSPITAL_COMMUNITY): Payer: Self-pay | Admitting: *Deleted

## 2013-01-19 ENCOUNTER — Inpatient Hospital Stay (HOSPITAL_COMMUNITY)
Admission: EM | Admit: 2013-01-19 | Discharge: 2013-01-22 | DRG: 566 | Disposition: A | Discharge status: Home / Self Care | Payer: BC Managed Care – PPO | Attending: Internal Medicine | Admitting: Internal Medicine

## 2013-01-19 DIAGNOSIS — E876 Hypokalemia: Secondary | ICD-10-CM

## 2013-01-19 DIAGNOSIS — G9340 Encephalopathy, unspecified: Secondary | ICD-10-CM | POA: Diagnosis present

## 2013-01-19 DIAGNOSIS — N319 Neuromuscular dysfunction of bladder, unspecified: Secondary | ICD-10-CM | POA: Diagnosis present

## 2013-01-19 DIAGNOSIS — M25559 Pain in unspecified hip: Secondary | ICD-10-CM

## 2013-01-19 DIAGNOSIS — L89109 Pressure ulcer of unspecified part of back, unspecified stage: Secondary | ICD-10-CM | POA: Diagnosis present

## 2013-01-19 DIAGNOSIS — K5641 Fecal impaction: Secondary | ICD-10-CM

## 2013-01-19 DIAGNOSIS — E860 Dehydration: Principal | ICD-10-CM | POA: Diagnosis present

## 2013-01-19 DIAGNOSIS — M2550 Pain in unspecified joint: Secondary | ICD-10-CM | POA: Diagnosis present

## 2013-01-19 DIAGNOSIS — R5381 Other malaise: Secondary | ICD-10-CM | POA: Diagnosis present

## 2013-01-19 DIAGNOSIS — N200 Calculus of kidney: Secondary | ICD-10-CM | POA: Diagnosis present

## 2013-01-19 DIAGNOSIS — Z87440 Personal history of urinary (tract) infections: Secondary | ICD-10-CM

## 2013-01-19 DIAGNOSIS — W19XXXD Unspecified fall, subsequent encounter: Secondary | ICD-10-CM

## 2013-01-19 DIAGNOSIS — R627 Adult failure to thrive: Secondary | ICD-10-CM | POA: Diagnosis present

## 2013-01-19 DIAGNOSIS — G35 Multiple sclerosis: Secondary | ICD-10-CM | POA: Diagnosis present

## 2013-01-19 DIAGNOSIS — R509 Fever, unspecified: Secondary | ICD-10-CM

## 2013-01-19 DIAGNOSIS — N390 Urinary tract infection, site not specified: Secondary | ICD-10-CM | POA: Diagnosis present

## 2013-01-19 DIAGNOSIS — R4182 Altered mental status, unspecified: Secondary | ICD-10-CM

## 2013-01-19 DIAGNOSIS — R6510 Systemic inflammatory response syndrome (SIRS) of non-infectious origin without acute organ dysfunction: Secondary | ICD-10-CM

## 2013-01-19 DIAGNOSIS — L89209 Pressure ulcer of unspecified hip, unspecified stage: Secondary | ICD-10-CM | POA: Diagnosis present

## 2013-01-19 DIAGNOSIS — D473 Essential (hemorrhagic) thrombocythemia: Secondary | ICD-10-CM

## 2013-01-19 DIAGNOSIS — Z66 Do not resuscitate: Secondary | ICD-10-CM | POA: Diagnosis not present

## 2013-01-19 DIAGNOSIS — M87059 Idiopathic aseptic necrosis of unspecified femur: Secondary | ICD-10-CM | POA: Diagnosis present

## 2013-01-19 DIAGNOSIS — L8990 Pressure ulcer of unspecified site, unspecified stage: Secondary | ICD-10-CM | POA: Diagnosis present

## 2013-01-19 DIAGNOSIS — Z87442 Personal history of urinary calculi: Secondary | ICD-10-CM

## 2013-01-19 DIAGNOSIS — N201 Calculus of ureter: Secondary | ICD-10-CM

## 2013-01-19 DIAGNOSIS — T07XXXA Unspecified multiple injuries, initial encounter: Secondary | ICD-10-CM | POA: Diagnosis present

## 2013-01-19 DIAGNOSIS — R531 Weakness: Secondary | ICD-10-CM

## 2013-01-19 DIAGNOSIS — M6282 Rhabdomyolysis: Secondary | ICD-10-CM

## 2013-01-19 DIAGNOSIS — I959 Hypotension, unspecified: Secondary | ICD-10-CM

## 2013-01-19 DIAGNOSIS — D72829 Elevated white blood cell count, unspecified: Secondary | ICD-10-CM

## 2013-01-19 DIAGNOSIS — M62838 Other muscle spasm: Secondary | ICD-10-CM | POA: Diagnosis present

## 2013-01-19 DIAGNOSIS — Z515 Encounter for palliative care: Secondary | ICD-10-CM

## 2013-01-19 DIAGNOSIS — L89309 Pressure ulcer of unspecified buttock, unspecified stage: Secondary | ICD-10-CM | POA: Diagnosis present

## 2013-01-19 DIAGNOSIS — A419 Sepsis, unspecified organism: Secondary | ICD-10-CM

## 2013-01-19 DIAGNOSIS — D649 Anemia, unspecified: Secondary | ICD-10-CM

## 2013-01-19 DIAGNOSIS — R000 Tachycardia, unspecified: Secondary | ICD-10-CM

## 2013-01-19 DIAGNOSIS — Z7401 Bed confinement status: Secondary | ICD-10-CM

## 2013-01-19 DIAGNOSIS — R339 Retention of urine, unspecified: Secondary | ICD-10-CM | POA: Diagnosis present

## 2013-01-19 DIAGNOSIS — E559 Vitamin D deficiency, unspecified: Secondary | ICD-10-CM | POA: Diagnosis present

## 2013-01-19 NOTE — ED Notes (Signed)
Pt with MS, bedridden. Caretaker didn't come today to see pt and family friends found him at home dehydrated and febrile. They called EMS.

## 2013-01-19 NOTE — ED Notes (Signed)
Bed: AV40 Expected date: 01/19/13 Expected time: 11:23 PM Means of arrival: Ambulance Comments: Ms. [name] Mccoy, fever

## 2013-01-20 ENCOUNTER — Encounter (HOSPITAL_COMMUNITY): Payer: Self-pay | Admitting: Internal Medicine

## 2013-01-20 ENCOUNTER — Emergency Department (HOSPITAL_COMMUNITY): Payer: BC Managed Care – PPO

## 2013-01-20 DIAGNOSIS — R5381 Other malaise: Secondary | ICD-10-CM | POA: Diagnosis present

## 2013-01-20 DIAGNOSIS — E559 Vitamin D deficiency, unspecified: Secondary | ICD-10-CM | POA: Diagnosis present

## 2013-01-20 DIAGNOSIS — Z66 Do not resuscitate: Secondary | ICD-10-CM | POA: Diagnosis not present

## 2013-01-20 DIAGNOSIS — R4182 Altered mental status, unspecified: Secondary | ICD-10-CM

## 2013-01-20 DIAGNOSIS — N390 Urinary tract infection, site not specified: Secondary | ICD-10-CM

## 2013-01-20 DIAGNOSIS — L8990 Pressure ulcer of unspecified site, unspecified stage: Secondary | ICD-10-CM | POA: Diagnosis present

## 2013-01-20 DIAGNOSIS — G35 Multiple sclerosis: Secondary | ICD-10-CM | POA: Diagnosis present

## 2013-01-20 DIAGNOSIS — N319 Neuromuscular dysfunction of bladder, unspecified: Secondary | ICD-10-CM | POA: Diagnosis present

## 2013-01-20 DIAGNOSIS — G9340 Encephalopathy, unspecified: Secondary | ICD-10-CM | POA: Diagnosis present

## 2013-01-20 DIAGNOSIS — Z87442 Personal history of urinary calculi: Secondary | ICD-10-CM | POA: Diagnosis not present

## 2013-01-20 DIAGNOSIS — Z7401 Bed confinement status: Secondary | ICD-10-CM | POA: Diagnosis not present

## 2013-01-20 DIAGNOSIS — R627 Adult failure to thrive: Secondary | ICD-10-CM | POA: Diagnosis present

## 2013-01-20 DIAGNOSIS — E860 Dehydration: Secondary | ICD-10-CM | POA: Diagnosis present

## 2013-01-20 DIAGNOSIS — M87059 Idiopathic aseptic necrosis of unspecified femur: Secondary | ICD-10-CM | POA: Diagnosis present

## 2013-01-20 DIAGNOSIS — M2550 Pain in unspecified joint: Secondary | ICD-10-CM | POA: Diagnosis present

## 2013-01-20 DIAGNOSIS — L89309 Pressure ulcer of unspecified buttock, unspecified stage: Secondary | ICD-10-CM

## 2013-01-20 DIAGNOSIS — L89109 Pressure ulcer of unspecified part of back, unspecified stage: Secondary | ICD-10-CM | POA: Diagnosis present

## 2013-01-20 DIAGNOSIS — Z87440 Personal history of urinary (tract) infections: Secondary | ICD-10-CM | POA: Diagnosis not present

## 2013-01-20 DIAGNOSIS — R339 Retention of urine, unspecified: Secondary | ICD-10-CM | POA: Diagnosis present

## 2013-01-20 DIAGNOSIS — R509 Fever, unspecified: Secondary | ICD-10-CM

## 2013-01-20 DIAGNOSIS — M62838 Other muscle spasm: Secondary | ICD-10-CM | POA: Diagnosis present

## 2013-01-20 DIAGNOSIS — L89209 Pressure ulcer of unspecified hip, unspecified stage: Secondary | ICD-10-CM | POA: Diagnosis present

## 2013-01-20 DIAGNOSIS — N200 Calculus of kidney: Secondary | ICD-10-CM

## 2013-01-20 LAB — URINE MICROSCOPIC-ADD ON

## 2013-01-20 LAB — COMPREHENSIVE METABOLIC PANEL
ALT: 8 U/L (ref 0–53)
AST: 19 U/L (ref 0–37)
Alkaline Phosphatase: 98 U/L (ref 39–117)
BUN: 18 mg/dL (ref 6–23)
CO2: 20 mEq/L (ref 19–32)
GFR calc Af Amer: >90 mL/min (ref >90)
GFR calc non Af Amer: >90 mL/min (ref >90)
Glucose, Bld: 95 mg/dL (ref 70–99)
Potassium: 4.6 mEq/L (ref 3.5–5.1)
Sodium: 134 mEq/L — ABNORMAL LOW (ref 135–145)
Total Protein: 9.2 g/dL — ABNORMAL HIGH (ref 6.0–8.3)

## 2013-01-20 LAB — CREATININE, SERUM
GFR calc Af Amer: >90 mL/min (ref >90)
GFR calc non Af Amer: >90 mL/min (ref >90)

## 2013-01-20 LAB — CBC WITH DIFFERENTIAL/PLATELET
Basophils Absolute: 0.1 10*3/uL (ref 0.0–0.1)
Eosinophils Relative: 3 % (ref 0–5)
HCT: 27.2 % — ABNORMAL LOW (ref 39.0–52.0)
Hemoglobin: 8.8 g/dL — ABNORMAL LOW (ref 13.0–17.0)
Lymphocytes Relative: 18 % (ref 12–46)
Lymphs Abs: 1.8 10*3/uL (ref 0.7–4.0)
MCV: 77.1 fL — ABNORMAL LOW (ref 78.0–100.0)
Monocytes Absolute: 1 10*3/uL (ref 0.1–1.0)
Monocytes Relative: 10 % (ref 3–12)
Neutro Abs: 6.7 10*3/uL (ref 1.7–7.7)
RBC: 3.53 MIL/uL — ABNORMAL LOW (ref 4.22–5.81)
RDW: 17.4 % — ABNORMAL HIGH (ref 11.5–15.5)
WBC: 9.9 10*3/uL (ref 4.0–10.5)

## 2013-01-20 LAB — URINALYSIS, ROUTINE W REFLEX MICROSCOPIC
Bilirubin Urine: NEGATIVE
Ketones, ur: NEGATIVE mg/dL
Nitrite: NEGATIVE
Protein, ur: 100 mg/dL — AB
Urobilinogen, UA: 0.2 mg/dL (ref 0.0–1.0)
pH: 5.5 (ref 5.0–8.0)

## 2013-01-20 MED ORDER — PIPERACILLIN-TAZOBACTAM 3.375 G IVPB
3.3750 g | Freq: Once | INTRAVENOUS | Status: AC
  Administered 2013-01-20: 3.375 g via INTRAVENOUS
  Filled 2013-01-20: qty 50

## 2013-01-20 MED ORDER — AMANTADINE HCL 100 MG PO CAPS
100.0000 mg | ORAL_CAPSULE | Freq: Two times a day (BID) | ORAL | Status: DC
  Administered 2013-01-20 – 2013-01-22 (×5): 100 mg via ORAL
  Filled 2013-01-20 (×8): qty 1

## 2013-01-20 MED ORDER — ENSURE COMPLETE PO LIQD
237.0000 mL | Freq: Two times a day (BID) | ORAL | Status: DC
  Administered 2013-01-20 – 2013-01-22 (×5): 237 mL via ORAL

## 2013-01-20 MED ORDER — ENSURE PUDDING PO PUDG
1.0000 | ORAL | Status: DC
  Administered 2013-01-20 – 2013-01-22 (×3): 1 via ORAL
  Filled 2013-01-20 (×3): qty 1

## 2013-01-20 MED ORDER — ENSURE COMPLETE PO LIQD
237.0000 mL | Freq: Three times a day (TID) | ORAL | Status: DC
Start: 2013-01-20 — End: 2013-01-20
  Administered 2013-01-20: 237 mL via ORAL

## 2013-01-20 MED ORDER — ADULT MULTIVITAMIN W/MINERALS CH
1.0000 | ORAL_TABLET | Freq: Every day | ORAL | Status: DC
  Administered 2013-01-20 – 2013-01-22 (×3): 1 via ORAL
  Filled 2013-01-20 (×3): qty 1

## 2013-01-20 MED ORDER — COLLAGENASE 250 UNIT/GM EX OINT
TOPICAL_OINTMENT | Freq: Every day | CUTANEOUS | Status: DC
  Administered 2013-01-20: 1 via TOPICAL
  Filled 2013-01-20: qty 30

## 2013-01-20 MED ORDER — VANCOMYCIN HCL IN DEXTROSE 1-5 GM/200ML-% IV SOLN
1000.0000 mg | Freq: Once | INTRAVENOUS | Status: AC
  Administered 2013-01-20: 1000 mg via INTRAVENOUS
  Filled 2013-01-20: qty 200

## 2013-01-20 MED ORDER — VANCOMYCIN HCL IN DEXTROSE 750-5 MG/150ML-% IV SOLN
750.0000 mg | Freq: Two times a day (BID) | INTRAVENOUS | Status: DC
  Administered 2013-01-20 – 2013-01-22 (×4): 750 mg via INTRAVENOUS
  Filled 2013-01-20 (×5): qty 150

## 2013-01-20 MED ORDER — INTERFERON BETA-1B 0.3 MG ~~LOC~~ KIT
0.2500 mg | PACK | SUBCUTANEOUS | Status: DC
  Administered 2013-01-21: 0.25 mg via SUBCUTANEOUS

## 2013-01-20 MED ORDER — DANTROLENE SODIUM 25 MG PO CAPS
25.0000 mg | ORAL_CAPSULE | Freq: Three times a day (TID) | ORAL | Status: DC
  Administered 2013-01-20 – 2013-01-22 (×8): 25 mg via ORAL
  Filled 2013-01-20 (×9): qty 1

## 2013-01-20 MED ORDER — GABAPENTIN 600 MG PO TABS
600.0000 mg | ORAL_TABLET | Freq: Four times a day (QID) | ORAL | Status: DC
  Filled 2013-01-20 (×4): qty 1

## 2013-01-20 MED ORDER — SODIUM CHLORIDE 0.9 % IV SOLN
Freq: Once | INTRAVENOUS | Status: AC
  Administered 2013-01-20: 02:00:00 via INTRAVENOUS

## 2013-01-20 MED ORDER — SODIUM CHLORIDE 0.9 % IJ SOLN
3.0000 mL | Freq: Two times a day (BID) | INTRAMUSCULAR | Status: DC
  Administered 2013-01-21 (×2): 3 mL via INTRAVENOUS

## 2013-01-20 MED ORDER — HEPARIN SODIUM (PORCINE) 5000 UNIT/ML IJ SOLN
5000.0000 [IU] | Freq: Three times a day (TID) | INTRAMUSCULAR | Status: DC
  Administered 2013-01-20 – 2013-01-22 (×8): 5000 [IU] via SUBCUTANEOUS
  Filled 2013-01-20 (×10): qty 1

## 2013-01-20 MED ORDER — MODAFINIL 200 MG PO TABS
200.0000 mg | ORAL_TABLET | Freq: Two times a day (BID) | ORAL | Status: DC
  Administered 2013-01-20 – 2013-01-22 (×5): 200 mg via ORAL
  Filled 2013-01-20 (×5): qty 1

## 2013-01-20 MED ORDER — SODIUM CHLORIDE 0.9 % IV BOLUS (SEPSIS)
1000.0000 mL | Freq: Once | INTRAVENOUS | Status: AC
  Administered 2013-01-20: 1000 mL via INTRAVENOUS

## 2013-01-20 MED ORDER — PRO-STAT SUGAR FREE PO LIQD
30.0000 mL | Freq: Two times a day (BID) | ORAL | Status: DC
  Administered 2013-01-20 – 2013-01-22 (×4): 30 mL via ORAL
  Filled 2013-01-20 (×6): qty 30

## 2013-01-20 MED ORDER — ACETAMINOPHEN 325 MG PO TABS
650.0000 mg | ORAL_TABLET | Freq: Once | ORAL | Status: AC
  Administered 2013-01-20: 650 mg via ORAL
  Filled 2013-01-20: qty 2

## 2013-01-20 MED ORDER — DEXTROSE-NACL 5-0.9 % IV SOLN
INTRAVENOUS | Status: DC
  Administered 2013-01-20: 125 mL/h via INTRAVENOUS
  Administered 2013-01-20 – 2013-01-22 (×5): via INTRAVENOUS

## 2013-01-20 MED ORDER — HYDROMORPHONE HCL PF 1 MG/ML IJ SOLN: 1.0000 mg | INTRAMUSCULAR | Status: DC | PRN

## 2013-01-20 MED ORDER — PIPERACILLIN-TAZOBACTAM 3.375 G IVPB
3.3750 g | Freq: Three times a day (TID) | INTRAVENOUS | Status: DC
  Administered 2013-01-20 – 2013-01-22 (×6): 3.375 g via INTRAVENOUS
  Filled 2013-01-20 (×8): qty 50

## 2013-01-20 MED ORDER — JUVEN PO PACK
1.0000 | PACK | Freq: Two times a day (BID) | ORAL | Status: DC
  Administered 2013-01-20 – 2013-01-22 (×4): 1 via ORAL
  Filled 2013-01-20 (×6): qty 1

## 2013-01-20 MED ORDER — GABAPENTIN 300 MG PO CAPS
600.0000 mg | ORAL_CAPSULE | Freq: Four times a day (QID) | ORAL | Status: DC
  Administered 2013-01-20 – 2013-01-22 (×10): 600 mg via ORAL
  Filled 2013-01-20 (×12): qty 2

## 2013-01-20 NOTE — Progress Notes (Signed)
Patient AV:[id number] [name] Mccoy      DOB: January 04, 1961      XBJ:[id number]  Thank you for consulting the Palliative Medicine Team at [facility] to meet your patient's and family's needs.   The reason that you asked [place] to see your patient is  For GOC and related symptom recommendations.  We have scheduled your patient for a meeting: 9/5 at 12 noon  The Surrogate decision make is: [name] Mccoy and other POA Contact [phone]  Other family members that need to be present: NA    Your patient is able/unable to participate:  Additional Narrative:

## 2013-01-20 NOTE — Progress Notes (Signed)
INITIAL NUTRITION ASSESSMENT  DOCUMENTATION CODES Per approved criteria  -Underweight   INTERVENTION: Provide Ensure Complete BID Provide Magic Cup and Ensure Pudding once daily each Provide Juven BID Provide Pro-stat BID Provide Multivitamin with minerals daily Encourage PO intake  NUTRITION DIAGNOSIS: INcreased nutrient needs related to underweight and wounds as evidenced by pt with BMI of 15.4 and multiple pressure ulcers.   Goal: Pt to meet >/= 90% of their estimated nutrition needs   Monitor:  PO intake Weight Labs Wounds  Reason for Assessment: Malnutrition Screening Tool, score of 3  52 y.o. male  Admitting Dx: Dehydration  ASSESSMENT: 52 y.o. male with PMH of MS, pressure ulcers requiring debridement, frequent admits within previous 3 months for UTI's who presented to [facility] ER with caregiver reporting that he has became more lethargic and not himself, and decrease oral intake. Hospitalist was asked to admit him for UTI, dehydration, alterered mental status, and failure to thrive. Per weight history pt has lost 16 lbs in the past 2 weeks but, pt states that his appetite has been good and he has been eating well with his usual routine of 2 meals daily and Ensure supplements 2-3 times daily. Pt usually weighs between 140 and 150 lbs. Pt currently weighs 123 lbs, making him very underweight with a BMI of 15.4 kg/m^2.  Pt's breakfast tray at bedside and pt had finished about 90% of tray. Encouraged pt to continue eating 100% of meals and drinking Ensure. Pt willing to try additional supplements during admission.  Height: Ht Readings from Last 1 Encounters:  01/20/13 6\' 3"  (1.905 m)    Weight: Wt Readings from Last 1 Encounters:  01/20/13 123 lb 0.3 oz (55.8 kg)    Ideal Body Weight: 196 lbs  % Ideal Body Weight: 63%  Wt Readings from Last 10 Encounters:  01/20/13 123 lb 0.3 oz (55.8 kg)  01/08/13 139 lb 5.3 oz (63.2 kg)  01/13/13 139 lb (63.05 kg)  11/09/12 134  lb (60.782 kg)  10/14/12 139 lb 5.3 oz (63.2 kg)  10/14/12 139 lb 5.3 oz (63.2 kg)  09/17/12 136 lb 11 oz (62 kg)  07/26/11 141 lb 12.1 oz (64.3 kg)    Usual Body Weight: 140 lbs  % Usual Body Weight: 88%  BMI:  Body mass index is 15.38 kg/(m^2).  Estimated Nutritional Needs: Kcal: 1800-2000 Protein: 84-94 grams Fluid: 2 L/day  Skin: pressure ulcer on right hip, pressure ulcer on left buttocks, and pressure ulcer on sacrum  Diet Order: General  EDUCATION NEEDS: -No education needs identified at this time   Intake/Output Summary (Last 24 hours) at 01/20/13 1131 Last data filed at 01/20/13 0900  Gross per 24 hour  Intake 272.92 ml  Output   1350 ml  Net -1077.08 ml    Last BM: PTA  Labs:   Recent Labs Lab 01/20/13 0118 01/20/13 0225  NA 134*  --   K 4.6  --   CL 99  --   CO2 20  --   BUN 18  --   CREATININE 0.60 0.66  CALCIUM 9.1  --   GLUCOSE 95  --     CBG (last 3)  No results found for this basename: GLUCAP,  in the last 72 hours  Scheduled Meds: . amantadine  100 mg Oral BID  . collagenase   Topical Daily  . dantrolene  25 mg Oral TID  . feeding supplement  237 mL Oral TID BM  . gabapentin  600 mg Oral  QID  . heparin  5,000 Units Subcutaneous Q8H  . modafinil  200 mg Oral BID  . piperacillin-tazobactam  3.375 g Intravenous Q8H  . sodium chloride  3 mL Intravenous Q12H  . vancomycin  750 mg Intravenous Q12H    Continuous Infusions: . dextrose 5 % and 0.9% NaCl 125 mL/hr at 01/20/13 0800    Past Medical History  Diagnosis Date  . Multiple sclerosis   . Pressure ulcer, buttock(707.05)   . Pain in joint, pelvic region and thigh   . Spasm of muscle   . Unspecified vitamin D deficiency   . Disturbance of skin sensation   . Right ureteral stone 01/03/13  . Renal stones   . Neurogenic bladder     01/13/13 FOLEY CATH  . Urinary retention with incomplete bladder emptying 01/13/13    FOLEY CATHETER    Past Surgical History  Procedure  Laterality Date  . Wound debridement Right 10/15/2012    Procedure: DEBRIDMENT OF RIGHT HIP DECUBITUS AND SACRAL DECUBITUS;  Surgeon: Velora Heckler, MD;  Location: [facility];  Service: General;  Laterality: Right;  left lateral position, right hip    Ian Malkin RD, LDN Inpatient Clinical Dietitian Pager: [phone] After Hours Pager: [phone]

## 2013-01-20 NOTE — Consult Note (Addendum)
WOC consult Note Reason for Consult: Pt familiar to [facility] team from previous admission.  Refer to progress notes from 8/18. Has multiple chronic pressure ulcers. Dressings have already been changed today; moist gauze packing to left buttock and right hip, and Santyl for chemical debridement to coccyx wound.  Pt is on a Sport low air loss bed to reduce pressure and has had a nutrition consult performed.  Multiple systemic factors may impair healing. Will plan to perform assessment of wounds tomorrow.  Reviewed plan of care with bedside nurse via phone. Pressure Ulcer POA: Yes Cammie Mcgee MSN, RN, CWOCN, Kiana, CNS [phone]

## 2013-01-20 NOTE — H&P (Signed)
[facility] History and Physical  [name] Mccoy. [name]:[id number] DOB: 02-13-1961    PCP:   [name] Moh, MD   Chief Complaint: Altered mental status (more lethargic) and decrease oral intake. HPI: [name] Mccoy. [name] Mccoy is an 52 y.o. male  with PMH of MS, pressure ulcers requiring debridement, frequent admits within previous 3 months for UTI's who presented to [facility] ER with caregiver reporting that he has became more lethargic and not himself, and decrease oral intake.  He recently had UTI with Provincia sensitive to cephalosporin and Bactrim and was discharged on Bactrim.  During the last hospitalization, he also was found to have hip fracture that is new, and it was felt that he is not a surgical candidate, so his fractures were treated medically.  He has a large right hip and sacral decubitus ulcer that are cared for by Dr. Norma Fredrickson and his home caregiver.  Evaluation in the ER showed normal WBC with Hb of 8 grams per DL, and normal renal Fx tests. His UA shows 21-50 WBCs and same for RBC.  He recently had a right kidney stone with hydronephrosis, and IR had placed a ureteral stent.   He has normal BP today,  but was tachycardic with a low grade temp of 100. He is a full code per medical record.  Hospitalist was asked to admit him for UTI, dehydration, alterered mental status, and failure to thrive.   Rewiew of Systems: I was not able to obtain a meaningful ROS>  Past Medical History  Diagnosis Date  . Multiple sclerosis   . Pressure ulcer, buttock(707.05)   . Pain in joint, pelvic region and thigh   . Spasm of muscle   . Unspecified vitamin D deficiency   . Disturbance of skin sensation   . Right ureteral stone 01/03/13  . Renal stones   . Neurogenic bladder     01/13/13 FOLEY CATH  . Urinary retention with incomplete bladder emptying 01/13/13    FOLEY CATHETER    Past Surgical History  Procedure Laterality Date  . Wound debridement Right 10/15/2012    Procedure: DEBRIDMENT OF  RIGHT HIP DECUBITUS AND SACRAL DECUBITUS;  Surgeon: [name] Heckler, MD;  Location: [facility];  Service: General;  Laterality: Right;  left lateral position, right hip    Medications:  HOME MEDS: Prior to Admission medications   Medication Sig Start Date End Date Taking? Authorizing Provider  amantadine (SYMMETREL) 100 MG capsule Take 100 mg by mouth 2 (two) times daily.     Historical Provider, MD  collagenase (SANTYL) ointment Apply topically daily. Apply to Unstageable pressure ulcer on coccyx once daily.  Apply 1/8 inch thick and top with saline dressing.  Change daily and PRN for soiling or loosening of dressing x 21 days. 09/21/12   Elease Etienne, MD  dantrolene (DANTRIUM) 25 MG capsule Take 25 mg by mouth 3 (three) times daily.    Historical Provider, MD  ezetimibe (ZETIA) 10 MG tablet Take 10 mg by mouth at bedtime.     Historical Provider, MD  feeding supplement (ENSURE COMPLETE) LIQD Take 237 mLs by mouth 3 (three) times daily between meals. 09/21/12   Elease Etienne, MD  gabapentin (NEURONTIN) 600 MG tablet Take 600 mg by mouth 4 (four) times daily.    Historical Provider, MD  HYDROcodone-acetaminophen (NORCO/VICODIN) 5-325 MG per tablet Take 2 tablets by mouth. Take 1 tablet every 8 hours as needed for pain(1-5) Take 2 tablets every 8 hours as needed for pain (  6-10). 10/25/12   Tiffany L Reed, DO  Interferon Beta-1b (BETASERON) 0.3 MG KIT injection Inject 0.25 mg into the skin every other day. 12/31/12   Suanne Marker, MD  modafinil (PROVIGIL) 200 MG tablet Take 200 mg by mouth 2 (two) times daily.     Historical Provider, MD  Multiple Vitamin (MULTIVITAMIN WITH MINERALS) TABS Take 1 tablet by mouth daily. 09/21/12   Elease Etienne, MD  nutrition supplement [name] Mccoy) PACK Take 1 packet by mouth 2 (two) times daily after a meal. 09/21/12   Elease Etienne, MD  sulfamethoxazole-trimethoprim (BACTRIM DS) 800-160 MG per tablet Take 1 tablet by mouth every 12 (twelve) hours. 01/08/13   [name] Elk, MD  tiZANidine (ZANAFLEX) 4 MG tablet Take 4 mg by mouth 4 (four) times daily.     Historical Provider, MD     Allergies:  No Known Allergies  Social History:   reports that he has never smoked. He has never used smokeless tobacco. He reports that he does not drink alcohol or use illicit drugs.  Family History: Family History  Problem Relation Age of Onset  . Hypertension Mother   . Dementia Mother   . Hypertension Father   . Heart disease Father      Physical Exam: Filed Vitals:   01/19/13 2344 01/20/13 0025 01/20/13 0318  BP: 104/74    Pulse: 128    Temp: 98.9 F (37.2 C) 100 F (37.8 C)   TempSrc: Oral Rectal   Height:   6\' 3"  (1.905 m)  Weight:   63.05 kg (139 lb)  SpO2: 99%     Blood pressure 104/74, pulse 128, temperature 100 F (37.8 C), temperature source Rectal, height 6\' 3"  (1.905 m), weight 63.05 kg (139 lb), SpO2 99.00%.  GEN: Lethargic patient lying in the stretcher in no acute distress; Not cooperative to exam.  He would open his eyes when called, but not talking. PSYCH: does not appear anxious or depressed; affect is appropriate. HEENT: Mucous membranes pink and anicteric; PERRLA; EOM intact; no cervical lymphadenopathy nor thyromegaly or carotid bruit; no JVD; There were no stridor. Neck is very supple. Breasts:: Not examined CHEST WALL: No tenderness CHEST: Normal respiration, clear to auscultation bilaterally.  HEART: Regular rate and rhythm.  There are no murmur, rub, or gallops.   BACK: No kyphosis or scoliosis; no CVA tenderness ABDOMEN: soft and non-tender; no masses, no organomegaly, normal abdominal bowel sounds; no pannus; no intertriginous candida. There is no rebound and no distention. Rectal Exam: Not done EXTREMITIES: Lower extremitiy contracture. Genitalia: not examined PULSES: 2+ and symmetric SKIN: Normal hydration no rash or ulceration CNS: Lethargic, but arousable, only to fall back to sleep.   Labs on Admission:   Basic Metabolic Panel:  Recent Labs Lab 01/20/13 0118  NA 134*  K 4.6  CL 99  CO2 20  GLUCOSE 95  BUN 18  CREATININE 0.60  CALCIUM 9.1   Liver Function Tests:  Recent Labs Lab 01/20/13 0118  AST 19  ALT 8  ALKPHOS 98  BILITOT 0.5  PROT 9.2*  ALBUMIN 2.5*   No results found for this basename: LIPASE, AMYLASE,  in the last 168 hours No results found for this basename: AMMONIA,  in the last 168 hours CBC:  Recent Labs Lab 01/20/13 0225  WBC 9.9  NEUTROABS 6.7  HGB 8.8*  HCT 27.2*  MCV 77.1*  PLT 625*   Cardiac Enzymes: No results found for this basename: CKTOTAL, CKMB, CKMBINDEX, TROPONINI,  in the last 168 hours  CBG: No results found for this basename: GLUCAP,  in the last 168 hours   Radiological Exams on Admission: Dg Chest Portable 1 View  01/20/2013   *RADIOLOGY REPORT*  Clinical Data: Fever and dehydration  PORTABLE CHEST - 1 VIEW  Comparison: Prior radiograph from 01/03/2013  Findings: Cardiac and mediastinal silhouettes are unchanged.  The lungs are per inflated.  Perihilar linear opacities, most prompt within the right perihilar region are again seen, most consistent with scarring.  No new focal infiltrate is identified. There is no pulmonary edema or pleural effusion.  No pneumothorax.  Pigtail catheter is partially seen in the right abdomen.  No acute osseous abnormality.  IMPRESSION: Similar appearance of the lungs with persistent hyperinflation and right upper lobe parenchymal scarring.  No acute cardiopulmonary process identified.   Original Report Authenticated By: Rise Mu, M.D.   Dg Hip Portable 1 View Right  01/20/2013   *RADIOLOGY REPORT*  Clinical Data: Fever.  Decubitus wound.  PORTABLE RIGHT HIP - 1 VIEW  Comparison: CT abdomen pelvis 01/03/2013.  Findings: Single view obtained due to patient contracture.  There is chronic subluxation of the right hip posteriorly.  Subchondral sclerosis of the right femoral head, consistent with  osteonecrosis. There is soft tissue heterotopic ossification about the hip joint.  Skin thickening in the right gluteal region.  No foreign body or osseous erosion is seen.  IMPRESSION: 1.  Limited exam due to patient contracture.  2.  No evidence of osseous infection.  3.  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   Original Report Authenticated By: Tiburcio Pea    Assessment/Plan Present on Admission:  . Dehydration . UTI (urinary tract infection) . Wounds, multiple . Renal stones . Pressure ulcer, buttock(707.05) . Multiple sclerosis . Pain in joint, pelvic region and thigh  PLAN: Chronically ill patient, debilitated, with MS and lower extremity contracture, hip decubitis and hip fracture medically treated, having obtructing kidney stone with recent ureteral stent, admitted for dehydration, UTI, and failure to thrive.  I will give IVF, IV Van/Zosyn, and will hold Betaseron at this time.  Will obtain BC, and resume most of his home meds.  He is a full code, and will be admitted to SDU.  He does have multiple serious problems and is quite ill.   Hopefully, with IVF, IV antibiotics, and supportive care, he will improve to his baseline.  Thank you for allowing me to participate in his care.  Other plans as per orders.  Code Status: FULL Unk Lightning, MD. [facility] Pager [phone] 7pm to 7am.  01/20/2013, 4:14 AM

## 2013-01-20 NOTE — Progress Notes (Signed)
Patient alert and oriented x4.  Patient's Godmother, Telford Nab [phone]) was visiting patient.  Ms. Earlene Plater asked for an update on the patient, and after verbal confirmation from the patient, an update was provided to Ms. Earlene Plater (patient stable, receiving IV antibiotics).  Ms. Earlene Plater then stated that she was concerned about the patient and that his caregiver (also his POA), Delorise Shiner, was not providing adequate care and believed the patient should be placed in a nursing home upon discharge.  Ms. Earlene Plater also wanted to speak with social work regarding the situation.  It was explained to Ms. Earlene Plater that the attending physician, Dr. Elisabeth Pigeon, would be notified of her concerns and would determine what the appropriate placement was upon discharge.  Ms. Earlene Plater expressed thanks. Dr. Elisabeth Pigeon had a meeting scheduled with the patient's POAs Delorise Shiner and Elease Hashimoto.  Dr. Elisabeth Pigeon notified of Godmother's concern prior to the meeting with POAs. Dr. Elisabeth Pigeon confirmed that she would address placement and other concerns with the POAs. After Ms. Earlene Plater had left the patient's room, spoke with the patient regarding his home situation.  The patient expressed that he was happy with his POAs and that his Godmother "looked for problems."  The patient again affirmed that he felt well cared for.  Kinnie Feil, RN

## 2013-01-20 NOTE — Progress Notes (Signed)
CARE MANAGEMENT NOTE 01/20/2013  Patient:  [name] Mccoy, [name] K.   Account Number:  [id number]  Date Initiated:  01/20/2013  Documentation initiated by:  Jamee Pacholski  Subjective/Objective Assessment:   pt with hx of ms and multiple decubitus represents with urosepsis     Action/Plan:   has a care giver that home pcp is Dr Norma Fredrickson   Anticipated DC Date:  01/23/2013   Anticipated DC Plan:  HOME/SELF CARE  In-house referral  NA      DC Planning Services  NA      [facility] Choice  NA   Choice offered to / List presented to:  NA   DME arranged  NA      DME agency  NA     HH arranged  NA      HH agency  NA   Status of service:  In process, will continue to follow Medicare Important Message given?  NA - LOS <3 / Initial given by admissions (If response is "NO", the following Medicare IM given date fields will be blank) Date Medicare IM given:   Date Additional Medicare IM given:    Discharge Disposition:    Per UR Regulation:  Reviewed for med. necessity/level of care/duration of stay  If discussed at Long Length of Stay Meetings, dates discussed:    Comments:  [id number] Stark Jock, BSN, [place] [phone] Chart Reviewed for discharge and hospital needs. Discharge needs at time of review:  None Review of patient progress due on [id number].

## 2013-01-20 NOTE — ED Notes (Addendum)
Attempted lab draw x 2, but unsucesssful. CN, Victorino Dike attempted lab draw, but no results. Called Phlebotomy to draw labs, will come as soon as possible.

## 2013-01-20 NOTE — ED Provider Notes (Signed)
CSN: [id number]     Arrival date & time 01/19/13  2337 History   First MD Initiated Contact with Patient 01/20/13 0004     Chief Complaint  Patient presents with  . Fever  . Dehydration   (Consider location/radiation/quality/duration/timing/severity/associated sxs/prior Treatment) HPI Comments: 52 year old male with history of MS presents from home with friends due to altered mental status. He states over the last several days he's become more confused. They stopped by a couple times today and never saw the caretaker. Per the  friends, the patient is supposed to have 24-hour care. They've not noticed any falls. They feel his decubitus wound is similar to how it is been postop. They noticed that he was clammy and felt like he was dehydrated when they visit him today.   The history is provided by a friend.    Past Medical History  Diagnosis Date  . Multiple sclerosis   . Pressure ulcer, buttock(707.05)   . Pain in joint, pelvic region and thigh   . Spasm of muscle   . Unspecified vitamin D deficiency   . Disturbance of skin sensation   . Right ureteral stone 01/03/13  . Renal stones   . Neurogenic bladder     01/13/13 [facility] CATH  . Urinary retention with incomplete bladder emptying 01/13/13    [facility] CATHETER   Past Surgical History  Procedure Laterality Date  . Wound debridement Right 10/15/2012    Procedure: DEBRIDMENT OF RIGHT HIP DECUBITUS AND SACRAL DECUBITUS;  Surgeon: Velora Heckler, MD;  Location: [facility];  Service: General;  Laterality: Right;  left lateral position, right hip   Family History  Problem Relation Age of Onset  . Hypertension Mother   . Dementia Mother   . Hypertension Father   . Heart disease Father    History  Substance Use Topics  . Smoking status: Never Smoker   . Smokeless tobacco: Never Used  . Alcohol Use: No    Review of Systems  Unable to perform ROS: Mental status change    Allergies  Review of patient's allergies indicates no known  allergies.  Home Medications   Current Outpatient Rx  Name  Route  Sig  Dispense  Refill  . amantadine (SYMMETREL) 100 MG capsule   Oral   Take 100 mg by mouth 2 (two) times daily.          . collagenase (SANTYL) ointment   Topical   Apply topically daily. Apply to Unstageable pressure ulcer on coccyx once daily.  Apply 1/8 inch thick and top with saline dressing.  Change daily and PRN for soiling or loosening of dressing x 21 days.   90 g   1   . dantrolene (DANTRIUM) 25 MG capsule   Oral   Take 25 mg by mouth 3 (three) times daily.         Marland Kitchen ezetimibe (ZETIA) 10 MG tablet   Oral   Take 10 mg by mouth at bedtime.          . feeding supplement (ENSURE COMPLETE) LIQD   Oral   Take 237 mLs by mouth 3 (three) times daily between meals.         . gabapentin (NEURONTIN) 600 MG tablet   Oral   Take 600 mg by mouth 4 (four) times daily.         Marland Kitchen HYDROcodone-acetaminophen (NORCO/VICODIN) 5-325 MG per tablet   Oral   Take 2 tablets by mouth. Take 1 tablet every 8 hours  as needed for pain(1-5) Take 2 tablets every 8 hours as needed for pain (6-10).         . Interferon Beta-1b (BETASERON) 0.3 MG KIT injection   Subcutaneous   Inject 0.25 mg into the skin every other day.   1 kit   1     Attn Latoya   . modafinil (PROVIGIL) 200 MG tablet   Oral   Take 200 mg by mouth 2 (two) times daily.          . Multiple Vitamin (MULTIVITAMIN WITH MINERALS) TABS   Oral   Take 1 tablet by mouth daily.         . nutrition supplement (JUVEN) PACK   Oral   Take 1 packet by mouth 2 (two) times daily after a meal.         . sulfamethoxazole-trimethoprim (BACTRIM DS) 800-160 MG per tablet   Oral   Take 1 tablet by mouth every 12 (twelve) hours.   26 tablet   0   . tiZANidine (ZANAFLEX) 4 MG tablet   Oral   Take 4 mg by mouth 4 (four) times daily.           BP 104/74  Pulse 128  Temp(Src) 98.9 F (37.2 C) (Oral)  SpO2 99% Physical Exam  Nursing note and  vitals reviewed. Constitutional: He is oriented to person, place, and time. He appears cachectic.  Curled up  HENT:  Head: Normocephalic and atraumatic.  Right Ear: External ear normal.  Left Ear: External ear normal.  Nose: Nose normal.  Eyes: Right eye exhibits no discharge. Left eye exhibits no discharge.  Neck: Neck supple.  Cardiovascular: Regular rhythm, normal heart sounds and intact distal pulses.  Tachycardia present.   Pulmonary/Chest: Effort normal and breath sounds normal. He has no wheezes. He has no rales.  Abdominal: Soft. There is no tenderness.  Genitourinary:  Foley and nephrostomy in place  Musculoskeletal: He exhibits no edema.       Legs: Neurological: He is alert and oriented to person, place, and time.  Skin: Skin is warm and dry.    ED Course  Procedures (including critical care time) Labs Review Labs Reviewed  COMPREHENSIVE METABOLIC PANEL - Abnormal; Notable for the following:    Sodium 134 (*)    Total Protein 9.2 (*)    Albumin 2.5 (*)    All other components within normal limits  URINALYSIS, ROUTINE W REFLEX MICROSCOPIC - Abnormal; Notable for the following:    APPearance CLOUDY (*)    Hgb urine dipstick MODERATE (*)    Protein, ur 100 (*)    Leukocytes, UA MODERATE (*)    All other components within normal limits  CBC WITH DIFFERENTIAL - Abnormal; Notable for the following:    RBC 3.53 (*)    Hemoglobin 8.8 (*)    HCT 27.2 (*)    MCV 77.1 (*)    MCH 24.9 (*)    RDW 17.4 (*)    Platelets 625 (*)    All other components within normal limits  URINE CULTURE  CULTURE, BLOOD (ROUTINE X 2)  CULTURE, BLOOD (ROUTINE X 2)  URINE MICROSCOPIC-ADD ON  CBC WITH DIFFERENTIAL  CG4 I-STAT (LACTIC ACID)   Imaging Review Dg Chest Portable 1 View  01/20/2013   *RADIOLOGY REPORT*  Clinical Data: Fever and dehydration  PORTABLE CHEST - 1 VIEW  Comparison: Prior radiograph from 01/03/2013  Findings: Cardiac and mediastinal silhouettes are unchanged.  The  lungs are per inflated.  Perihilar linear opacities, most prompt within the right perihilar region are again seen, most consistent with scarring.  No new focal infiltrate is identified. There is no pulmonary edema or pleural effusion.  No pneumothorax.  Pigtail catheter is partially seen in the right abdomen.  No acute osseous abnormality.  IMPRESSION: Similar appearance of the lungs with persistent hyperinflation and right upper lobe parenchymal scarring.  No acute cardiopulmonary process identified.   Original Report Authenticated By: Rise Mu, M.D.   Dg Hip Portable 1 View Right  01/20/2013   *RADIOLOGY REPORT*  Clinical Data: Fever.  Decubitus wound.  PORTABLE RIGHT HIP - 1 VIEW  Comparison: CT abdomen pelvis 01/03/2013.  Findings: Single view obtained due to patient contracture.  There is chronic subluxation of the right hip posteriorly.  Subchondral sclerosis of the right femoral head, consistent with osteonecrosis. There is soft tissue heterotopic ossification about the hip joint.  Skin thickening in the right gluteal region.  No foreign body or osseous erosion is seen.  IMPRESSION: 1.  Limited exam due to patient contracture.  2.  No evidence of osseous infection.  3.  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   Original Report Authenticated By: Tiburcio Pea    MDM   1. Altered mental status   2. Dehydration   3. Fever, unspecified   4. Multiple sclerosis   5. Pressure ulcer, buttock(707.05)   6. Renal calculus, right   7. UTI (urinary tract infection)   8. Anemia   9. Multiple sclerosis exacerbation    Unsure if source is from drainage of hip wound vs UTI. Patient seems to have gradual onset of AMS c/w delirium from infection. Will get blood cultures and treat with vanc/zosyn given his recent admission within last month. Given fluids and consulted hospitalist for admission.    Audree Camel, MD 01/20/13 [phone]

## 2013-01-20 NOTE — Progress Notes (Signed)
[facility] PROGRESS NOTE  [name] Mccoy. [name] Mccoy GNF:[id number] DOB: [birth year] DOA: 01/19/2013 PCP: Londell Moh, MD  Brief narrative: 52 -year-old male with past medical history significant including but not limited to multiple sclerosis, pressure ulcers (right hip and sacral ulcer) requiring debridement, multiple admissions for urinary tract infections and recent hospitalization for a UTI secondary to Providencia species in addition to findings of hip fracture and he was felt not to be a surgical candidate who presented [facility] ED 01/19/2013 do to altered mental status, lethargy, poor oral intake. In ED, blood pressure was 104/74, heart rate 116, Tmax 100 F and oxygen saturation 99% on room air. CBC revealed hemoglobin of 8.8. BMP revealed sodium of 134 and the rest of the blood work was essentially unremarkable. Chest x-ray showed similar appearance of the lungs with persistent hyperinflation and right upper lobe parenchymal scarring. 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 Urinalysis showed moderate leukocytes.  Assessment/Plan:  Principal problem: Acute encephalopathy - Likely secondary to recurrent urinary tract infection, pneumonia - Patient was started on vancomycin and Zosyn empirically - Follow up blood culture results and urine culture results - Followup PT evaluation if patient able to participate Active Problems:   Multiple sclerosis - continue current medications: modafinil (for fatigue associated with MS)   Wounds, multiple - Coccygeal unstageable unstageable, right hip stage III ulcer, sacral ulcers stage III - Appreciate wound care consult   UTI (urinary tract infection) - on vanco and zosyn - follow up urine culture results Muscle spasm  - Continue dantrolene - Dilaudid 1 mg IV every 2 hours as needed for severe pain    Code Status: full code Family Communication: no  family at the bedside Disposition Plan: home when stable  Manson Passey, MD  [facility] Pager [phone]  If 7PM-7AM, please contact night-coverage [email or web address] Password [email or web address] 01/20/2013, 11:27 AM   LOS: 1 day   Consultants:   Physical therapy   Nutrition    Procedures:  None   Antibiotics:  Vancomycin 01/20/2013 -->  Zosyn 01/20/2013 -->  HPI/Subjective: No events since admission.  Objective: Filed Vitals:   01/20/13 0504 01/20/13 0639 01/20/13 0800 01/20/13 0900  BP: 113/79 120/65 107/72 120/73  Pulse: 116  110 105  Temp:  97.9 F (36.6 C)    TempSrc:  Oral    Resp: 20 18 17 15   Height:  6\' 3"  (1.905 m)    Weight:  55.8 kg (123 lb 0.3 oz)    SpO2: 99% 99% 99% 100%    Intake/Output Summary (Last 24 hours) at 01/20/13 1127 Last data filed at 01/20/13 0900  Gross per 24 hour  Intake 272.92 ml  Output   1350 ml  Net -1077.08 ml    Exam:   General:  Pt is sleeping, not in acute distress  Cardiovascular: Regular rate and rhythm, S1/S2, no murmurs, no rubs, no gallops  Respiratory: Clear to auscultation bilaterally, no wheezing, no crackles, no rhonchi  Abdomen: Soft, non tender, non distended, bowel sounds present, no guarding  Extremities: No edema, pulses DP and PT palpable bilaterally  Neuro: Grossly nonfocal  Data Reviewed: Basic Metabolic Panel:  Recent Labs Lab 01/20/13 0118 01/20/13 0225  NA 134*  --   K 4.6  --   CL 99  --   CO2 20  --   GLUCOSE 95  --   BUN 18  --   CREATININE 0.60 0.66  CALCIUM 9.1  --    Liver Function Tests:  Recent Labs Lab 01/20/13 0118  AST 19  ALT 8  ALKPHOS 98  BILITOT 0.5  PROT 9.2*  ALBUMIN 2.5*   No results found for this basename: LIPASE, AMYLASE,  in the last 168 hours No results found for this basename: AMMONIA,  in the last 168 hours CBC:  Recent Labs Lab 01/20/13 0225  WBC 9.9  NEUTROABS 6.7  HGB 8.8*  HCT 27.2*  MCV 77.1*  PLT 625*   Cardiac Enzymes: No results found  for this basename: CKTOTAL, CKMB, CKMBINDEX, TROPONINI,  in the last 168 hours BNP: No components found with this basename: POCBNP,  CBG: No results found for this basename: GLUCAP,  in the last 168 hours  MRSA PCR SCREENING     Status: None   Collection Time    01/20/13  6:45 AM      Result Value Range Status   MRSA by PCR NEGATIVE  NEGATIVE Final     Studies: Dg Chest Portable 1 View 01/20/2013   * IMPRESSION: Similar appearance of the lungs with persistent hyperinflation and right upper lobe parenchymal scarring.  No acute cardiopulmonary process identified.   Original Report Authenticated By: Rise Mu, M.D.   Dg Hip Portable 1 View Right 01/20/2013    IMPRESSION: 1.  Limited exam due to patient contracture.  2.  No evidence of osseous infection.  3.  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   Original Report Authenticated By: Tiburcio Pea    Scheduled Meds: . amantadine  100 mg Oral BID  . dantrolene  25 mg Oral TID  . gabapentin  600 mg Oral QID  . heparin  5,000 Units Subcutaneous Q8H  . modafinil  200 mg Oral BID  . piperacillin-tazobactam  3.375 g Intravenous Q8H  . vancomycin  750 mg Intravenous Q12H   Continuous Infusions: . dextrose 5 % and 0.9% NaCl 125 mL/hr at 01/20/13 0800

## 2013-01-20 NOTE — ED Notes (Signed)
Helmut Muster, RN called from ICU and asked if we would wait until shift change for patient to come upstairs because they had a nurse go home early. I requested that she let the [facility] know and that was fine.

## 2013-01-20 NOTE — ED Notes (Signed)
I-stat CG4 was given to the physician.

## 2013-01-20 NOTE — Progress Notes (Addendum)
ANTIBIOTIC CONSULT NOTE - INITIAL  Pharmacy Consult for vancomycin Indication: wound and UTI  No Known Allergies  Patient Measurements: Height: 6\' 3"  (190.5 cm) Weight: 123 lb 0.3 oz (55.8 kg) IBW/kg (Calculated) : 84.5 Adjusted Body Weight:   Vital Signs: Temp: 97.9 F (36.6 C) (09/04 0639) Temp src: Oral (09/04 0639) BP: 120/65 mmHg (09/04 0639) Pulse Rate: 116 (09/04 0504) Intake/Output from previous day: 09/03 0701 - 09/04 0700 In: -  Out: 1350 [Urine:1125] Intake/Output from this shift: Total I/O In: -  Out: 1350 [Urine:1125; Other:225]  Labs:  Recent Labs  01/20/13 0118 01/20/13 0225  WBC  --  9.9  HGB  --  8.8*  PLT  --  625*  CREATININE 0.60  --    Estimated Creatinine Clearance: 87.2 ml/min (by C-G formula based on Cr of 0.6). No results found for this basename: VANCOTROUGH, Leodis Binet, VANCORANDOM, GENTTROUGH, GENTPEAK, GENTRANDOM, TOBRATROUGH, TOBRAPEAK, TOBRARND, AMIKACINPEAK, AMIKACINTROU, AMIKACIN,  in the last 72 hours   Microbiology: Recent Results (from the past 720 hour(s))  CULTURE, BLOOD (ROUTINE X 2)     Status: None   Collection Time    01/03/13  7:41 AM      Result Value Range Status   Specimen Description BLOOD NECK   Final   Special Requests BOTTLES DRAWN AEROBIC AND ANAEROBIC [facility] EACH   Final   Culture  Setup Time     Final   Value: 01/03/2013 11:02     Performed at [facility]   Culture     Final   Value: PROVIDENCIA STUARTII     Note: Gram Stain Report Called to,Read Back By and Verified With:  MEGAN STOCKS AT 0100A ON [id number] BY BRMEL     Performed at [facility]   Report Status 01/06/2013 FINAL   Final   Organism ID, Bacteria PROVIDENCIA STUARTII   Final  CULTURE, BLOOD (ROUTINE X 2)     Status: None   Collection Time    01/03/13  7:46 AM      Result Value Range Status   Specimen Description BLOOD RIGHT HAND   Final   Special Requests BOTTLES DRAWN AEROBIC AND ANAEROBIC 2 CC EACH   Final   Culture  Setup  Time     Final   Value: 01/03/2013 11:02     Performed at [facility]   Culture     Final   Value: PROVIDENCIA STUARTII     Note: SUSCEPTIBILITIES PERFORMED ON PREVIOUS CULTURE WITHIN THE LAST 5 DAYS.     Note: Gram Stain Report Called to,Read Back By and Verified With:  Wonda Horner ON [id number] AT 0144A BY BRMEL     Performed at [facility]   Report Status 01/06/2013 FINAL   Final  URINE CULTURE     Status: None   Collection Time    01/03/13  8:17 AM      Result Value Range Status   Specimen Description URINE, CATHETERIZED   Final   Special Requests NONE   Final   Culture  Setup Time     Final   Value: 01/03/2013 11:02     Performed at [facility] Count     Final   Value: >=100,000 COLONIES/ML     Performed at [facility]   Culture     Final   Value: PROVIDENCIA STUARTII     Performed at [facility]   Report Status 01/04/2013 FINAL   Final  Organism ID, Bacteria PROVIDENCIA STUARTII   Final  MRSA PCR SCREENING     Status: Abnormal   Collection Time    01/03/13 12:56 PM      Result Value Range Status   MRSA by PCR POSITIVE (*) NEGATIVE Final   Comment:            The GeneXpert MRSA Assay (FDA     approved for NASAL specimens     only), is one component of a     comprehensive MRSA colonization     surveillance program. It is not     intended to diagnose MRSA     infection nor to guide or     monitor treatment for     MRSA infections.     RESULT CALLED TO, READ BACK BY AND VERIFIED WITH:     Jolayne Panther RN 16:00 01/03/13 ([email or web address])     Performed at [facility]    Medical History: Past Medical History  Diagnosis Date  . Multiple sclerosis   . Pressure ulcer, buttock(707.05)   . Pain in joint, pelvic region and thigh   . Spasm of muscle   . Unspecified vitamin D deficiency   . Disturbance of skin sensation   . Right ureteral stone 01/03/13  . Renal stones   . Neurogenic bladder     01/13/13 [facility] CATH  .  Urinary retention with incomplete bladder emptying 01/13/13    FOLEY CATHETER   Assessment: 21 YOM with h/o MS admitted with lethargy and mental status changes. He has several admissions in the last couple months.  Most recently 8/18 urine cultures grew Providencia stuartii (sensitive to ceftriaxone, zosyn, TMP/SMZ)  He also had multiple decubitus.   9/4 >> vancomycin  >> 9/4 >> zosyn per MD>>    Tmax: 100 WBCs: 9.9 Renal: SCr = 0.6 with CrCl 88ml/min (C-G) and >152ml/min  9/4 blood: pending 9/4 urine: pending  Dose changes/drug level info:   Goal of Therapy:  Vancomycin trough level 10-15 mcg/ml  Plan:   Based on previous admission dosing/levels and current weight, start vancomycin 750mg  IV q12h  Check steady state trough as SCr may be overestimating actual renal fx in this patient  Continue zosyn as ordered.   Juliette Alcide, PharmD, BCPS.   Pager: [phone]  01/20/2013,6:58 AM

## 2013-01-21 DIAGNOSIS — R627 Adult failure to thrive: Secondary | ICD-10-CM

## 2013-01-21 DIAGNOSIS — Z515 Encounter for palliative care: Secondary | ICD-10-CM

## 2013-01-21 DIAGNOSIS — D649 Anemia, unspecified: Secondary | ICD-10-CM

## 2013-01-21 DIAGNOSIS — R531 Weakness: Secondary | ICD-10-CM

## 2013-01-21 DIAGNOSIS — R5381 Other malaise: Secondary | ICD-10-CM

## 2013-01-21 DIAGNOSIS — D72829 Elevated white blood cell count, unspecified: Secondary | ICD-10-CM

## 2013-01-21 LAB — URINE CULTURE: Culture: NO GROWTH

## 2013-01-21 MED ORDER — COLLAGENASE 250 UNIT/GM EX OINT
TOPICAL_OINTMENT | Freq: Every day | CUTANEOUS | Status: DC
  Administered 2013-01-21 – 2013-01-22 (×2): via TOPICAL
  Filled 2013-01-21: qty 30

## 2013-01-21 NOTE — Consult Note (Signed)
[name] Mccoy      DOB: 10-Aug-1960      AVW:[id number]     Consult Note from the Palliative Medicine Team at [facility]    Consult Requested by: [name] Mccoy     PCP: [name] Moh, MD Reason for Consultation:Clarification of GOC and options     Phone Number:[phone]  Assessment of patients Current state:  [name] with PMH of [name], pressure ulcers requiring debridement, frequent admits within previous 3 months for UTI's who presented to [facility] ER with caregiver reporting 2 days of chills that progressed to fevers / sweating, weakness / fatigue from rigors, altered mental status / hallucinations. He also has had decreased PO intake and one day of diarrhea. Caregiver reports pt was sent home in July from a SNF for rehab with a foley catheter. It has been changed once monthly since. She noted a change in the urine quality 1 wk prior to admit - darker with more sediment and that the urine had been leaking from the meatus despite the catheter. He has a large right hip and sacral decubitus ulcer that are cared for by [name] Mccoy and his home caregiver.  Overall failure to thrive, bed bound, BLE contractures, albumin 2.5, poor po intake, weight loss, frequent rehospitalization, pressure ulcers.  Care for in home by patchwork of HPOAs and friends and neighbors   Consult is for review of medical treatment options, clarification of goals of care and end of life issues, disposition and options, and symptom recommendation.  This NP [name] Mccoy reviewed medical records, received report from team, assessed the [name] and then meet at the [name]'s bedside along with his Durable POA [name] Mccoy # [phone] and HPOA [name] Mccoy # [phone] and a [name] Mccoy his "godmother" to discuss diagnosis prognosis, GOC, EOL wishes disposition and options.   A detailed discussion was had today regarding advanced directives.  Concepts specific to code status, artifical feeding and hydration,  continued IV antibiotics and rehospitalization was had.  The difference between a aggressive medical intervention path  and a palliative comfort care path for this [name] at this time was had.  Values and goals of care important to [name] and family were attempted to be elicited.  Concept of Hospice and Palliative Care were discussed  Natural trajectory and expectations with ES [name] was discussed in detail.  Questions and concerns addressed.  PMT will continue to support holistically.   Goals of Care: 1.  Code Status: DNR/DNI   2. Scope of Treatment: 1. Vital Signs: per unit 2. Wound care-open to evaluation and wound Vac at home if recommended  3. Respiratory/Oxygen:yes 4. Nutritional Support/Tube Feeds:no artificial feeding now or in the future 5. Antibiotics:yes 6. IVF: yes  7. Labs:yes as needed for treament   4. Disposition: [name] does not want to consider SNF "even for a little while".  Home caregivers verbalize concern for repeated re hospitalizations and continued decline..  I discussed with all involved that at some point they may not wish to continue to support and enable this less than optimal situation. SW checked into [facility] as a support options( not eligible).  I gave contact info for home physician visits.  Caregivers actively pursueing in home services through the county.  3. Symptom Management:   1. Adult failure to thrive:  [name] and caregivers strongly encouraged to consider detailed advanced directives in light of his diagnosis and long term prognosis.  Recommend SNF placement for 24/7 nursing care and safety.  4. Psychosocial:  Emotional support to [name] and HPOA at bedside.  This is a difficult situation, all involved commit to doing the best they can in a less than optimal situation.  5. Spiritual: Refuses chaplain support at this time     [name] Documents Completed or Given: Document Given Completed  Advanced Directives Pkt    MOST yes yes  DNR  yes   Gone from My Sight    Hard Choices      Brief HPI: [name] with PMH of [name], pressure ulcers requiring debridement, frequent admits within previous 3 months for UTI's who presented to [facility] ER with caregiver reporting 2 days of chills that progressed to fevers / sweating, weakness / fatigue from rigors, altered mental status / hallucinations. He also has had decreased PO intake and one day of diarrhea. Caregiver reports pt was sent home in July from a SNF for rehab with a foley catheter. It has been changed once monthly since. She noted a change in the urine quality 1 wk prior to admit - darker with more sediment and that the urine had been leaking from the meatus despite the catheter. He has a large right hip and sacral decubitus ulcer that are cared for by [name] Mccoy and his home caregiver.  Overall failure to thrive, bed bound, BLE contractures, albumin 2.5, poor po intake, weight loss, frequent rehospitalization, pressure ulcers.  Care for in home by patchwork of HPOAs and friends and neighbors      ROS:  Weakness, fatigue    PMH:  Past Medical History  Diagnosis Date  . Multiple sclerosis   . Pressure ulcer, buttock(707.05)   . Pain in joint, pelvic region and thigh   . Spasm of muscle   . Unspecified vitamin D deficiency   . Disturbance of skin sensation   . Right ureteral stone 01/03/13  . Renal stones   . Neurogenic bladder     01/13/13 FOLEY CATH  . Urinary retention with incomplete bladder emptying 01/13/13    FOLEY CATHETER     PSH: Past Surgical History  Procedure Laterality Date  . Wound debridement Right 10/15/2012    Procedure: DEBRIDMENT OF RIGHT HIP DECUBITUS AND SACRAL DECUBITUS;  Surgeon: [name] Heckler, MD;  Location: [facility] ORS;  Service: General;  Laterality: Right;  left lateral position, right hip   I have reviewed the FH and SH and  If appropriate update it with new information. No Known Allergies Scheduled Meds: . amantadine  100 mg Oral BID  . collagenase    Topical Daily  . dantrolene  25 mg Oral TID  . feeding supplement  237 mL Oral BID BM  . feeding supplement  1 Container Oral Q24H  . feeding supplement  30 mL Oral BID WC  . gabapentin  600 mg Oral QID  . heparin  5,000 Units Subcutaneous Q8H  . Interferon Beta-1b  0.25 mg Subcutaneous QODAY  . modafinil  200 mg Oral BID  . multivitamin with minerals  1 tablet Oral Daily  . nutrition supplement  1 packet Oral BID PC  . piperacillin-tazobactam  3.375 g Intravenous Q8H  . sodium chloride  3 mL Intravenous Q12H  . vancomycin  750 mg Intravenous Q12H   Continuous Infusions: . dextrose 5 % and 0.9% NaCl 125 mL/hr at 01/21/13 1008   PRN Meds:.HYDROmorphone (DILAUDID) injection    BP 110/80  Pulse 102  Temp(Src) 97.7 F (36.5 C) (Oral)  Resp 18  Ht 6\' 3"  (1.905  m)  Wt 55.8 kg (123 lb 0.3 oz)  BMI 15.38 kg/m2  SpO2 100%   PPS:30 % at best   Intake/Output Summary (Last 24 hours) at 01/21/13 1514 Last data filed at 01/21/13 1300  Gross per 24 hour  Intake   2640 ml  Output   1675 ml  Net    965 ml     Physical Exam:  General: chronically ill appearing, cachetic, NAD  HEENT:  Mm no exudate Chest:   diminished in bases CTA CVS: RRR Abdomen: flat, NT +BS Ext: BLE contractures, + muscle wasting Neuro: alert and oriented X3, fully engaged in conversation, poor insight into his overall situation and options  Labs: CBC    Component Value Date/Time   WBC 9.9 01/20/2013 0225   RBC 3.53* 01/20/2013 0225   HGB 8.8* 01/20/2013 0225   HCT 27.2* 01/20/2013 0225   PLT 625* 01/20/2013 0225   MCV 77.1* 01/20/2013 0225   MCH 24.9* 01/20/2013 0225   MCHC 32.4 01/20/2013 0225   RDW 17.4* 01/20/2013 0225   LYMPHSABS 1.8 01/20/2013 0225   MONOABS 1.0 01/20/2013 0225   EOSABS 0.3 01/20/2013 0225   BASOSABS 0.1 01/20/2013 0225    BMET    Component Value Date/Time   NA 134* 01/20/2013 0118   K 4.6 01/20/2013 0118   CL 99 01/20/2013 0118   CO2 20 01/20/2013 0118   GLUCOSE 95 01/20/2013 0118   BUN 18  01/20/2013 0118   CREATININE 0.66 01/20/2013 0225   CALCIUM 9.1 01/20/2013 0118   GFRNONAA >90 01/20/2013 0225   GFRAA >90 01/20/2013 0225    CMP     Component Value Date/Time   NA 134* 01/20/2013 0118   K 4.6 01/20/2013 0118   CL 99 01/20/2013 0118   CO2 20 01/20/2013 0118   GLUCOSE 95 01/20/2013 0118   BUN 18 01/20/2013 0118   CREATININE 0.66 01/20/2013 0225   CALCIUM 9.1 01/20/2013 0118   PROT 9.2* 01/20/2013 0118   ALBUMIN 2.5* 01/20/2013 0118   AST 19 01/20/2013 0118   ALT 8 01/20/2013 0118   ALKPHOS 98 01/20/2013 0118   BILITOT 0.5 01/20/2013 0118   GFRNONAA >90 01/20/2013 0225   GFRAA >90 01/20/2013 0225      Time In Time Out Total Time Spent with [name] Total Overall Time  1400 1530 80 min 90 min    Greater than 50%  of this time was spent counseling and coordinating care related to the above assessment and plan. [name] Creed NP  Palliative Medicine Team Team Phone # [phone] Pager [phone]  Discussed with [name] Mccoy

## 2013-01-21 NOTE — Progress Notes (Signed)
[facility] PROGRESS NOTE  [name] Mccoy. [name] Mccoy ZOX:[id number] DOB: November 15, 1960 DOA: 01/19/2013 PCP: Londell Moh, MD  Brief narrative: 52 year-old male with past medical history significant including but not limited to multiple sclerosis, pressure ulcers (right hip and sacral ulcer) requiring debridement, multiple admissions for urinary tract infections and recent hospitalization for a UTI secondary to Providencia species in addition to findings of hip fracture and he was felt not to be a surgical candidate who presented to [facility] ED 01/19/2013 do to altered mental status, lethargy, poor oral intake.  In ED, blood pressure was 104/74, heart rate 116, Tmax 100 F and oxygen saturation 99% on room air. CBC revealed hemoglobin of 8.8. BMP revealed sodium of 134 and the rest of the blood work was essentially unremarkable. Chest x-ray showed similar appearance of the lungs with persistent hyperinflation and right upper lobe parenchymal scarring. 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 Urinalysis showed moderate leukocytes.   Assessment/Plan:   Principal problem:  Acute encephalopathy  - Likely secondary to recurrent urinary tract infection, pneumonia  - Mental status much better this morning, patient is oriented to time, place and person - Patient was started on vancomycin and Zosyn empirically; we will continue this regimen for today - Follow up blood culture results and urine culture results - show no growth to date - Followup PT evaluation and recommendations  Active Problems:  Multiple sclerosis  - continue current medications: modafinil (for fatigue associated with MS)  Wounds, multiple  - Coccygeal unstageable unstageable, right hip stage III ulcer, sacral ulcers stage III  - Management per wound care recommendations UTI (urinary tract infection)  - on vanco and zosyn  -Urine culture shows no  growth to date Muscle spasm  - Continue dantrolene  - Dilaudid 1 mg IV every 2 hours as needed for severe pain   Code Status: full code  Family Communication: no family at the bedside  Disposition Plan: to SNF when stable  Consultants:  Physical therapy  Nutrition  Procedures:  None  Antibiotics:  Vancomycin 01/20/2013 -->  Zosyn 01/20/2013 -->  Manson Passey, MD  [facility] Pager [phone]  If 7PM-7AM, please contact night-coverage [email or web address] Password [email or web address] 01/21/2013, 9:20 AM   LOS: 2 days    HPI/Subjective: Feels better today.  Objective: Filed Vitals:   01/20/13 1200 01/20/13 1400 01/20/13 2245 01/21/13 0642  BP: 107/76 110/68 106/63 96/66  Pulse: 118  103 96  Temp: 98 F (36.7 C)  98.1 F (36.7 C) 97.3 F (36.3 C)  TempSrc: Oral  Oral Oral  Resp: 20 13 20 20   Height:      Weight:      SpO2: 99%  100% 100%    Intake/Output Summary (Last 24 hours) at 01/21/13 0920 Last data filed at 01/21/13 0700  Gross per 24 hour  Intake   3200 ml  Output   1325 ml  Net   1875 ml    Exam:   General:  Pt is alert, follows commands appropriately, not in acute distress  Cardiovascular: Regular rate and rhythm, S1/S2, no murmurs, no rubs, no gallops  Respiratory: Clear to auscultation bilaterally, no wheezing, no crackles, no rhonchi  Abdomen: Soft, non tender, non distended, bowel sounds present, no guarding  Extremities: No edema, pulses DP and PT palpable bilaterally; LE contractures  Skin: sacral, coccygeal ulcer, right hip ulcer  Neuro: Grossly nonfocal  Data Reviewed: Basic Metabolic Panel:  Recent Labs Lab 01/20/13 0118 01/20/13 0225  NA 134*  --   K 4.6  --   CL 99  --   CO2 20  --   GLUCOSE 95  --   BUN 18  --   CREATININE 0.60 0.66  CALCIUM 9.1  --    Liver Function Tests:  Recent Labs Lab 01/20/13 0118  AST 19  ALT 8  ALKPHOS 98  BILITOT 0.5  PROT 9.2*  ALBUMIN 2.5*   No results found for this basename: LIPASE,  AMYLASE,  in the last 168 hours No results found for this basename: AMMONIA,  in the last 168 hours CBC:  Recent Labs Lab 01/20/13 0225  WBC 9.9  NEUTROABS 6.7  HGB 8.8*  HCT 27.2*  MCV 77.1*  PLT 625*   Cardiac Enzymes: No results found for this basename: CKTOTAL, CKMB, CKMBINDEX, TROPONINI,  in the last 168 hours BNP: No components found with this basename: POCBNP,  CBG: No results found for this basename: GLUCAP,  in the last 168 hours  Recent Results (from the past 240 hour(s))  URINE CULTURE     Status: None   Collection Time    01/20/13  2:28 AM      Result Value Range Status   Specimen Description URINE, CATHETERIZED   Final   Special Requests NONE   Final   Culture  Setup Time     Final   Value: 01/20/2013 10:09     Performed at [facility] Count     Final   Value: NO GROWTH     Performed at [facility]   Culture     Final   Value: NO GROWTH     Performed at [facility]   Report Status 01/21/2013 FINAL   Final  CULTURE, BLOOD (ROUTINE X 2)     Status: None   Collection Time    01/20/13  4:20 AM      Result Value Range Status   Specimen Description BLOOD RIGHT HAND   Final   Special Requests BOTTLES DRAWN AEROBIC AND ANAEROBIC 5 CC EACH   Final   Culture  Setup Time     Final   Value: 01/20/2013 10:07     Performed at [facility]   Culture     Final   Value:        BLOOD CULTURE RECEIVED NO GROWTH TO DATE CULTURE WILL BE HELD FOR 5 DAYS BEFORE ISSUING A FINAL NEGATIVE REPORT     Performed at [facility]   Report Status PENDING   Incomplete  CULTURE, BLOOD (ROUTINE X 2)     Status: None   Collection Time    01/20/13  4:30 AM      Result Value Range Status   Specimen Description BLOOD LEFT HAND   Final   Special Requests BOTTLES DRAWN AEROBIC AND ANAEROBIC 5CC EACH   Final   Culture  Setup Time     Final   Value: 01/20/2013 10:07     Performed at [facility]   Culture     Final   Value:         BLOOD CULTURE RECEIVED NO GROWTH TO DATE CULTURE WILL BE HELD FOR 5 DAYS BEFORE ISSUING A FINAL NEGATIVE REPORT     Performed at [facility]   Report Status PENDING   Incomplete  MRSA PCR SCREENING     Status: None   Collection  Time    01/20/13  6:45 AM      Result Value Range Status   MRSA by PCR NEGATIVE  NEGATIVE Final   Comment:            The GeneXpert MRSA Assay (FDA     approved for NASAL specimens     only), is one component of a     comprehensive MRSA colonization     surveillance program. It is not     intended to diagnose MRSA     infection nor to guide or     monitor treatment for     MRSA infections.     Studies: Dg Chest Portable 1 View  01/20/2013   *RADIOLOGY REPORT*  Clinical Data: Fever and dehydration  PORTABLE CHEST - 1 VIEW  Comparison: Prior radiograph from 01/03/2013  Findings: Cardiac and mediastinal silhouettes are unchanged.  The lungs are per inflated.  Perihilar linear opacities, most prompt within the right perihilar region are again seen, most consistent with scarring.  No new focal infiltrate is identified. There is no pulmonary edema or pleural effusion.  No pneumothorax.  Pigtail catheter is partially seen in the right abdomen.  No acute osseous abnormality.  IMPRESSION: Similar appearance of the lungs with persistent hyperinflation and right upper lobe parenchymal scarring.  No acute cardiopulmonary process identified.   Original Report Authenticated By: Rise Mu, M.D.   Dg Hip Portable 1 View Right  01/20/2013   *RADIOLOGY REPORT*  Clinical Data: Fever.  Decubitus wound.  PORTABLE RIGHT HIP - 1 VIEW  Comparison: CT abdomen pelvis 01/03/2013.  Findings: Single view obtained due to patient contracture.  There is chronic subluxation of the right hip posteriorly.  Subchondral sclerosis of the right femoral head, consistent with osteonecrosis. There is soft tissue heterotopic ossification about the hip joint.  Skin thickening in the right  gluteal region.  No foreign body or osseous erosion is seen.  IMPRESSION: 1.  Limited exam due to patient contracture.  2.  No evidence of osseous infection.  3.  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   Original Report Authenticated By: Tiburcio Pea    Scheduled Meds: . amantadine  100 mg Oral BID  . collagenase   Topical Daily  . dantrolene  25 mg Oral TID  . feeding supplement  237 mL Oral BID BM  . feeding supplement  1 Container Oral Q24H  . feeding supplement  30 mL Oral BID WC  . gabapentin  600 mg Oral QID  . heparin  5,000 Units Subcutaneous Q8H  . Interferon Beta-1b  0.25 mg Subcutaneous QODAY  . modafinil  200 mg Oral BID  . multivitamin with minerals  1 tablet Oral Daily  . nutrition supplement  1 packet Oral BID PC  . piperacillin-tazobactam  3.375 g Intravenous Q8H  . sodium chloride  3 mL Intravenous Q12H  . vancomycin  750 mg Intravenous Q12H   Continuous Infusions: . dextrose 5 % and 0.9% NaCl 125 mL/hr at 01/21/13 0120

## 2013-01-21 NOTE — Evaluation (Addendum)
Physical Therapy Evaluation-1x eval Patient Details Name: [name] Mccoy. Cypert MRN: [id number] DOB: 1961/05/10 Today's Date: 01/21/2013 Time: 3244-0102 PT Time Calculation (min): 10 min  PT Assessment / Plan / Recommendation History of Present Illness  52 -year-old male with past medical history significant including but not limited to multiple sclerosis, pressure ulcers (right hip and sacral ulcer) requiring debridement, multiple admissions for urinary tract infections and recent hospitalization for a UTI secondary to Providencia species in addition to findings of hip fracture and he was felt not to be a surgical candidate who presented to [facility] ED 01/19/2013 do to altered mental status, lethargy, poor oral intake.  Clinical Impression  Limited eval-attempted ROM and rolling in bed. Pt with bil LE severe flexion contractures and recent history of L femoral neck fx and R femoral head dislocation further limiting mobility. Pt is not appropriate for PT services in acute setting. Pt is likely at his mobility baseline (total assist). Pt states he has all DME at home and that he has a caregiver available 24/7. He also states caregiver has been educated on ROM exercises.     PT Assessment  Patent does not need any further PT services    Follow Up Recommendations  SNF. (Recommend SNF placement if caregiver is unable to continue to care for pt at this level)    Does the patient have the potential to tolerate intense rehabilitation      Barriers to Discharge        Equipment Recommendations  None recommended by PT    Recommendations for Other Services     Frequency      Precautions / Restrictions Precautions Precautions: Fall Restrictions Weight Bearing Restrictions: Yes Other Position/Activity Restrictions: Per chart-recent history of L fem neck fx, R femoral head dislocation   Pertinent Vitals/Pain R LE with any movement. Denies pain at rest.       Mobility  Bed Mobility Details for Bed  Mobility Assistance: Attempted rolling towards R side-pt with difficulty due to to painful R LE 2* fracture. Pt already in L sidelying in fetal position. LEs unable to be extended.  Transfers Transfers: Not assessed Details for Transfer Assistance: Pt unable Ambulation/Gait Ambulation/Gait Assistance: Not tested (comment) Ambulation/Gait Assistance Details: Pt unable    Exercises     PT Diagnosis:    PT Problem List:   PT Treatment Interventions:       PT Goals(Current goals can be found in the care plan section) Acute Rehab PT Goals Patient Stated Goal: to walk again PT Goal Formulation: No goals set, d/c therapy  Visit Information  Last PT Received On: 01/21/13 Assistance Needed: +2 History of Present Illness: 13 -year-old male with past medical history significant including but not limited to multiple sclerosis, pressure ulcers (right hip and sacral ulcer) requiring debridement, multiple admissions for urinary tract infections and recent hospitalization for a UTI secondary to Providencia species in addition to findings of hip fracture and he was felt not to be a surgical candidate who presented to [facility] ED 01/19/2013 do to altered mental status, lethargy, poor oral intake.       Prior Functioning  Home Living Family/patient expects to be discharged to:: Private residence Living Arrangements: Alone Available Help at Discharge: Personal care attendant;Available 24 hours/day Home Access: Level entry Home Layout: One level Additional Comments: manual wheelchair, shower seat, straight cane, hoyer lift, hospital bed Prior Function Level of Independence: Needs assistance Communication Communication: No difficulties    Cognition  Cognition Arousal/Alertness: Awake/alert Behavior During  Therapy: WFL for tasks assessed/performed Overall Cognitive Status: Within Functional Limits for tasks assessed    Extremity/Trunk Assessment Upper Extremity Assessment Upper Extremity Assessment:  Generalized weakness Lower Extremity Assessment Lower Extremity Assessment: LLE deficits/detail;RLE deficits/detail RLE Deficits / Details: severe flexion contracture hip and knee- >90 degrees at both joints LLE Deficits / Details: severe flexion contracture hip and knee- >90 degrees at both joints   Balance    End of Session PT - End of Session Activity Tolerance: Patient limited by pain Patient left: in bed;with call bell/phone within reach  GP     Rebeca Alert, MPT Pager: [phone]

## 2013-01-21 NOTE — Consult Note (Signed)
WOC follow up Note Reason for follow up:  Ongoing wound care Wound type: Pressure ulcers to Right hip, Left buttock and sacrum, present on admission.  Pressure Ulcer POA: Yes Measurements:  Right hip 9 cm x 13 cm x 0.3 cm.  20 % yellow slough from 4:00 to 8:00.  Scarring on periwound from previous ulceration.  Left buttock: 6.5 cm x 4 cm x 0.2 cm, 100 % clean, pink wound bed Sacrum:  2 cm x 2.5 cm x 0.2 cm.  Undermining from 6:00 to 12:00.  75 % yellow slough, 25 % clean, pink tissue.  Wound bed: See above, granulation tissue and slough.  Drainage (amount, consistency, odor) Moderate brown/green tinged drainage. Musky odor.  Periwound: Scarring from previous ulcerations to Right hip, otherwise intact.  Dressing procedure/placement/frequency:  Santyl to devitalized tissue (slough), (1/8 inch thick) and NS moist gauze to granulation/pink tissue. 4x4 gauze and ABD pads on top and secured with Medfix tape.  Patient contractures make dressing changes more challenging and patient has some pain with turning from hip fractures that cannot be repaired at this time.    Patient able to tolerate pain from turning and did not require medication. Once position returned to midline, stated felt "better".     Maple Hudson RN BSN CWON Pager [phone]

## 2013-01-21 NOTE — Care Management Note (Signed)
    Page 1 of 2   01/21/2013     4:43:37 PM   CARE MANAGEMENT NOTE 01/21/2013  Patient:  [name] Mccoy, [name] K.   Account Number:  [id number]  Date Initiated:  01/20/2013  Documentation initiated by:  DAVIS,RHONDA  Subjective/Objective Assessment:   pt with hx of ms and multiple decubitus represents with urosepsis     Action/Plan:   has a care giver that home pcp is Dr Norma Fredrickson   Anticipated DC Date:  01/23/2013   Anticipated DC Plan:  HOME W HOME HEALTH SERVICES  In-house referral  NA      DC Planning Services  NA      [facility] Choice  NA   Choice offered to / List presented to:  C-1 Patient   DME arranged  NA      DME agency  NA     HH arranged  NA      HH agency  NA   Status of service:  In process, will continue to follow Medicare Important Message given?  NA - LOS <3 / Initial given by admissions (If response is "NO", the following Medicare IM given date fields will be blank) Date Medicare IM given:   Date Additional Medicare IM given:    Discharge Disposition:    Per UR Regulation:  Reviewed for med. necessity/level of care/duration of stay  If discussed at Long Length of Stay Meetings, dates discussed:    Comments:  01/21/13 Reyann Troop RN,BSN NCM [phone] NOT LTAC APPROPRIATE-NOT ON VENT.PROVIDED [facility] AGENCY LIST.[facility] CHOSEN-KRISTEN REP AWARE & FOLLOWING.WOULD RECOMMEND HHRN/PT/OT.WILL NEED AMBULANCE TRANSP @ D/C.  [id number] Earlene Plater, RN, BSN, [place] [phone] Chart Reviewed for discharge and hospital needs. Discharge needs at time of review:  None Review of patient progress due on 0907014.

## 2013-01-22 NOTE — Progress Notes (Signed)
   CARE MANAGEMENT NOTE 01/22/2013  Patient:  [name] Mccoy, [name] K.   Account Number:  [id number]  Date Initiated:  01/20/2013  Documentation initiated by:  DAVIS,RHONDA  Subjective/Objective Assessment:   pt with hx of ms and multiple decubitus represents with urosepsis     Action/Plan:   has a care giver that home pcp is Dr Norma Fredrickson   Anticipated DC Date:  01/23/2013   Anticipated DC Plan:  HOME W HOME HEALTH SERVICES  In-house referral  NA      DC Planning Services  CM consult      [facility] Choice  HOME HEALTH   Choice offered to / List presented to:  C-1 Patient        HH arranged  HH-2 PT  HH-3 OT  HH-1 RN  HH-6 SOCIAL WORKER  HH-4 NURSE'S AIDE      HH agency  [facility]   Status of service:  Completed, signed off Medicare Important Message given?  NA - LOS <3 / Initial given by admissions (If response is "NO", the following Medicare IM given date fields will be blank) Date Medicare IM given:   Date Additional Medicare IM given:    Discharge Disposition:  HOME W HOME HEALTH SERVICES  Per UR Regulation:  Reviewed for med. necessity/level of care/duration of stay  If discussed at Long Length of Stay Meetings, dates discussed:    Comments:  01/22/2013 1239 NCM spoke to pt and agreeable to [facility] for [facility]. Notified [facility] of pt's dc home today. Isidoro Donning RN CCM Case Mgmt phone [phone]  01/21/13 KATHY MAHABIR RN,BSN NCM [phone] NOT LTAC APPROPRIATE-NOT ON VENT.PROVIDED [facility] AGENCY LIST.[facility] CHOSEN-KRISTEN REP AWARE & FOLLOWING.WOULD RECOMMEND HHRN/PT/OT.WILL NEED AMBULANCE TRANSP @ D/C.  [id number] [name] Plater, RN, BSN, [place] [phone] Chart Reviewed for discharge and hospital needs. Discharge needs at time of review:  None Review of patient progress due on [id number].

## 2013-01-22 NOTE — Progress Notes (Signed)
Called and notified patient's POA: Ms. Dareen Piano that patient is on his way. Erskin Burnet RN

## 2013-01-22 NOTE — Progress Notes (Signed)
Patient's POA Ms. Dareen Piano called extremely upset that the patient had not left yet. Explained that since he was not able to leave until 4 pm that EMS had him on the schedule and should be leaving momentarily. Charge RN called EMS and reminded them that patient had to be home in a certain time window. Erskin Burnet RN

## 2013-01-22 NOTE — Progress Notes (Signed)
With patient permission gave discharge instructions to POA Ms. Anderson. Ms. [name] Mccoy states that he cannot leave here before  4 pm as she will not be able to be at home to meet him until then. Will continue to monitor patient. Erskin Burnet RN

## 2013-01-22 NOTE — Progress Notes (Signed)
Patient left via transport without home medication: Betaseron. Called and left message for his POA Ms. Dareen Piano that she can come and pick them up at anytime. Medication form sent to medical records. Erskin Burnet RN

## 2013-01-22 NOTE — Discharge Summary (Signed)
Physician Discharge Summary  [name] Mccoy. [name] Mccoy ZOX:[id number] DOB: [birth year] DOA: 01/19/2013  PCP: [name] Moh, MD  Admit date: 01/19/2013 Discharge date: 01/22/2013  Recommendations for Outpatient Follow-up:  1. Please note that you do not need to have antibiotics on discharge as your blood cultures and urine culture showed no growth on this admission. You have received vanco and zosyn for 4 days during this hospital stay.  2. You may continue your current home medications. 3. Order was placed for home health nurse, aide, PT and OT for any additional help that may be offered for help 4. You have declined the offer to skilled nursing home placement but order still placed for social work for future needs. 5. Hemoglobin was 8.8 on this admission 6. Kidney function was normal during this hospital stay 7. White blood cell count was normal on this admission 8. Follow up with PCP in 1-2 weeks from discharge to make sure symptoms better.   Discharge wound care:     Comments:    Pressure ulcers to Right hip, Left buttock and sacrum  Right hip 9 cm x 13 cm x 0.3 cm.  20 % yellow slough from 4:00 to 8:00.  Scarring on periwound from previous ulceration.    Left buttock: 6.5 cm x 4 cm x 0.2 cm, 100 % clean, pink wound bed  Sacrum:  2 cm x 2.5 cm x 0.2 cm.  Undermining from 6:00 to 12:00.  75 % yellow slough, 25 % clean, pink tissue.      Drainage (amount, consistency, odor) Moderate brown/green tinged drainage. Musky odor.    Periwound: Scarring from previous ulcerations to Right hip, otherwise intact.    Dressing procedure/placement/frequency:  Santyl to devitalized tissue (slough), (1/8 inch thick) and NS moist gauze to granulation/pink tissue. 4x4 gauze and ABD pads on top and secured with Medfix tape.   Patient contractures make dressing changes more challenging and patient has some pain with turning from hip fractures that cannot be repaired at this time.    Patient able to tolerate  pain from turning and did not require medication.    Discharge Diagnoses:  Active Problems:   Multiple sclerosis   Dehydration   Wounds, multiple   UTI (urinary tract infection)   Pressure ulcer, buttock(707.05)   Pain in joint, pelvic region and thigh   Renal stones   Altered mental status   Weakness generalized   Palliative care encounter   Adult failure to thrive    Discharge Condition: medically stable for discharge home today  Diet recommendation: as tolerated  History of present illness:  52 year-old male with past medical history significant including but not limited to multiple sclerosis, pressure ulcers (right hip and sacral ulcer) requiring debridement, multiple admissions for urinary tract infections and recent hospitalization for a UTI secondary to Providencia species in addition to findings of hip fracture and he was felt not to be a surgical candidate who presented to [facility] ED 01/19/2013 do to altered mental status, lethargy, poor oral intake.  In ED, blood pressure was 104/74, heart rate 116, Tmax 100 F and oxygen saturation 99% on room air. CBC revealed hemoglobin of 8.8. BMP revealed sodium of 134 and the rest of the blood work was essentially unremarkable. Chest x-ray showed similar appearance of the lungs with persistent hyperinflation and right upper lobe parenchymal scarring. 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 Urinalysis showed moderate leukocytes.  Assessment/Plan:   Principal problem:  Acute encephalopathy  - Likely secondary to recurrent urinary tract infection, pneumonia  - Mental status at [place] - Patient was started on vancomycin and Zosyn empirically; we will d/c all antibiotics as urine and blood cultures all negative  - pt declined SNF placement - appreciate palliative care services recomendations Active Problems:  Multiple sclerosis  - continue  current medications: modafinil (for fatigue associated with MS)  Wounds, multiple  - Coccygeal unstageable unstageable, right hip stage III ulcer, sacral ulcers stage III  - Management per wound care recommendations - recommendations noted above in outpatient follow up section UTI (urinary tract infection)  - was on vanco and zosyn which we will d/c as cultures negative Muscle spasm  - Continue dantrolene  - Pt was give Dilaudid 1 mg IV every 2 hours as needed for severe pain in hospital  Code Status: DNR/DNI Family Communication: no family at the bedside   Consultants:  Physical therapy  Nutrition  Palliative care team for GOC Procedures:  None  Antibiotics:  Vancomycin 01/19/2013 --> 01/22/2013 Zosyn 01/19/2013 -->01/22/2013   Manson Passey, MD  [facility]  Pager [phone]   Signed:  Manson Passey, MD  [facility] 01/22/2013, 11:31 AM  Pager #: [phone]    Discharge Exam: Filed Vitals:   01/22/13 0500  BP: 101/69  Pulse: 108  Temp: 97.6 F (36.4 C)  Resp: 20   Filed Vitals:   01/21/13 0642 01/21/13 1421 01/21/13 2220 01/22/13 0500  BP: 96/66 110/80 114/76 101/69  Pulse: 96 102 109 108  Temp: 97.3 F (36.3 C) 97.7 F (36.5 C) 98.2 F (36.8 C) 97.6 F (36.4 C)  TempSrc: Oral Oral Oral Oral  Resp: 20 18 20 20   Height:      Weight:      SpO2: 100% 100% 100% 98%    General: Pt is alert, follows commands appropriately, not in acute distress Cardiovascular: Regular rate and rhythm, S1/S2 +, no murmurs, no rubs, no gallops Respiratory: Clear to auscultation bilaterally, no wheezing, no crackles, no rhonchi Abdominal: Soft, non tender, non distended, bowel sounds +, no guarding Extremities: no edema, LE contractures, no cyanosis, pulses palpable bilaterally DP and PT Neuro: Grossly nonfocal  Discharge Instructions  Discharge Orders   Future Appointments Provider Department Dept Phone   01/31/2013 11:30 AM Suanne Marker, MD [facility] [phone]   Future Orders Complete By Expires   Call MD for:  difficulty breathing, headache or visual disturbances  As directed    Call MD for:  persistant dizziness or light-headedness  As directed    Call MD for:  persistant nausea and vomiting  As directed    Call MD for:  severe uncontrolled pain  As directed    Diet - low sodium heart healthy  As directed    Discharge instructions  As directed    Comments:     1. Please note that you do not need to have antibiotics on discharge as your blood cultures and urine culture showed no growth on this admission. You have received vanco and zosyn for 4 days during this hospital stay.  2. You may continue your current home medications. 3. Order was placed for home health nurse, aide, PT and OT for any additional help that may be offered for help 4. You have declined the offer to skilled nursing home placement but order still placed for social work for future needs. 5. Hemoglobin was 8.8 on this admission 6. Kidney  function was normal during this hospital stay 7. White blood cell count was normal on this admission 8. Follow up with PCP in 1-2 weeks from discharge to make sure symptoms better.   Discharge wound care:  As directed    Comments:     Pressure ulcers to Right hip, Left buttock and sacrum  Right hip 9 cm x 13 cm x 0.3 cm.  20 % yellow slough from 4:00 to 8:00.  Scarring on periwound from previous ulceration.    Left buttock: 6.5 cm x 4 cm x 0.2 cm, 100 % clean, pink wound bed  Sacrum:  2 cm x 2.5 cm x 0.2 cm.  Undermining from 6:00 to 12:00.  75 % yellow slough, 25 % clean, pink tissue.      Drainage (amount, consistency, odor) Moderate brown/green tinged drainage. Musky odor.    Periwound: Scarring from previous ulcerations to Right hip, otherwise intact.    Dressing procedure/placement/frequency:  Santyl to devitalized tissue (slough), (1/8 inch thick) and NS moist gauze to granulation/pink tissue. 4x4  gauze and ABD pads on top and secured with Medfix tape.   Patient contractures make dressing changes more challenging and patient has some pain with turning from hip fractures that cannot be repaired at this time.    Patient able to tolerate pain from turning and did not require medication.   Increase activity slowly  As directed        Medication List    STOP taking these medications       sulfamethoxazole-trimethoprim 800-160 MG per tablet  Commonly known as:  BACTRIM DS      TAKE these medications       amantadine 100 MG capsule  Commonly known as:  SYMMETREL  Take 100 mg by mouth 2 (two) times daily.     collagenase ointment  Commonly known as:  SANTYL  Apply topically daily. Apply to Unstageable pressure ulcer on coccyx once daily.  Apply 1/8 inch thick and top with saline dressing.  Change daily and PRN for soiling or loosening of dressing x 21 days.     dantrolene 25 MG capsule  Commonly known as:  DANTRIUM  Take 25 mg by mouth 3 (three) times daily.     ezetimibe 10 MG tablet  Commonly known as:  ZETIA  Take 10 mg by mouth at bedtime.     feeding supplement Liqd  Take 237 mLs by mouth 3 (three) times daily between meals.     nutrition supplement Pack  Take 1 packet by mouth 2 (two) times daily after a meal.     gabapentin 600 MG tablet  Commonly known as:  NEURONTIN  Take 600 mg by mouth 4 (four) times daily.     HYDROcodone-acetaminophen 5-325 MG per tablet  Commonly known as:  NORCO/VICODIN  Take 2 tablets by mouth. Take 1 tablet every 8 hours as needed for pain(1-5) Take 2 tablets every 8 hours as needed for pain (6-10).     Interferon Beta-1b 0.3 MG Kit injection  Commonly known as:  BETASERON  Inject 0.25 mg into the skin every other day.     modafinil 200 MG tablet  Commonly known as:  PROVIGIL  Take 200 mg by mouth 2 (two) times daily.     multivitamin with minerals Tabs tablet  Take 1 tablet by mouth daily.     tiZANidine 4 MG tablet  Commonly  known as:  ZANAFLEX  Take 4 mg by mouth 4 (four) times daily.  Follow-up Information   Follow up with [name] Moh, MD In 1 week.   Specialty:  Internal Medicine   Contact information:   [address] [place] [place] [address] [phone]        The results of significant diagnostics from this hospitalization (including imaging, microbiology, ancillary and laboratory) are listed below for reference.    Significant Diagnostic Studies: Ct Abdomen Pelvis Wo Contrast  01/03/2013   *RADIOLOGY REPORT*  Clinical Data: Generalized weakness.  Question UTI.  Lethargic. Bed sores.  Sore throat.  Multiple sclerosis.  CT ABDOMEN AND PELVIS WITHOUT CONTRAST  Technique:  Multidetector CT imaging of the abdomen and pelvis was performed following the standard protocol without intravenous contrast.  Comparison: 07/28/2011.  Findings: Proximal right ureteral 1.3 cm obstructing stone with mild to moderate right-sided hydronephrosis.  Several nonobstructing right renal calculi noted.  Small nonobstructing left lower pole renal calculi.  There is fullness of the left renal collecting system.  No definitive left ureteral obstructing stone.  Fullness of the collecting systems may be partially explained by bladder outlet obstruction as the bladder is prominent in size. Calcification along the posterior aspect of the bladder wall may reflect changes of chronic distension/inflammation.  Right lung base 3.7 mm nodule (series 4 image 7) not previously imaged. Given risk factors for bronchogenic carcinoma, follow-up chest CT at 1 year is recommended.  This recommendation follows the consensus statement:  Guidelines for Management of Small Pulmonary Nodules Detected on CT Scans:  A Statement from the Fleischner Society as published in Radiology 2005; 237:395-400.  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  No CT evidence of  pericholecystic inflammation.  Ultrasound can be obtained for further delineation if this is of clinical concern.  Circumferential thickening rectosigmoid region may be related to under distension.  Inflammation not excluded.  No free intraperitoneal air.  Evaluation of solid abdominal viscera is limited by lack of IV contrast.  Taking this limitation into account:  No worrisome hepatic, splenic, adrenal or pancreatic lesion.  Left femoral neck fracture with fragmentation of the left femoral head and acetabulum.  This is new from the prior exam.  Subluxed/partially dislocated right femoral head with impaction against the posterior acetabulum.  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  Sacral decubiti with sclerotic appearance of the left ilium new from prior examination.  Osteomyelitis is a possibility.  IMPRESSION:  Proximal right ureteral 1.3 cm obstructing stone with mild to moderate right-sided hydronephrosis.  Several nonobstructing right renal calculi noted.  Small nonobstructing left lower pole renal calculi.  There is fullness of the left renal collecting system.  No definitive left ureteral obstructing stone.  Fullness of the collecting systems may be partially explained by bladder outlet obstruction as the bladder is prominent in size. Calcification along the posterior aspect of the bladder wall may reflect changes of chronic distension/inflammation.  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  No CT evidence of pericholecystic inflammation.  Ultrasound can be obtained for further delineation if this is of clinical concern.  Circumferential thickening rectosigmoid region may be related to under distension.  Inflammation not excluded.  No free intraperitoneal air.  Left femoral neck fracture with fragmentation of the left femoral head and acetabulum.  This is new from the prior exam.  Subluxed/partially dislocated right  femoral head with impaction against the posterior acetabulum.  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  Sacral decubiti  with sclerotic appearance of the left ilium new from prior examination.  Osteomyelitis is a possibility.  Right lung base 3.7 mm nodule (series 4 image 7) not previously imaged. Follow up as noted above.   Original Report Authenticated By: Lacy Duverney, M.D.   US Renal  01/03/2013   *RADIOLOGY REPORT*  Clinical Data: Renal insufficiency  RENAL / URINARY TRACT ULTRASOUND  Technique:  Complete ultrasound exam of the kidneys and urinary bladder was performed.  Comparison: CT scan from 01/03/2013  Findings:  The right kidney measures 13.3  cm in long axis.  The left kidney measures 12.8 cm.  Mild to moderate left hydronephrosis is evident and while comparing across modalities is difficult, the degree of collecting system distention appears to be roughly similar to the previous CT scan. At least two stones in the 5-6 mm size range are seen in the left kidney towards the lower pole.  There is mild to moderate right-sided hydronephrosis with multiple right renal stones evident.  Images in the central anatomic pelvis reveal a distended bladder with posterior bladder wall thickening and intraluminal debris.  Incidental imaging of the gallbladder shows multiple gallstones.  Impression:  Bilateral mild to moderate hydronephrosis with bilateral renal stones.  There is seen to be some associated dilatation of the proximal right ureter although an etiology for the bilateral hydronephrosis is not evident on today's study.  The patient has multiple bilateral renal stones and the obstruction may be secondary to the stone disease.  Posterior bladder wall thickening with debris in the urinary bladder.  Bladder infection would be a consideration.   Original Report Authenticated By: Kennith Center, M.D.   Ir Perc Nephrostomy Right  01/03/2013   *RADIOLOGY REPORT*  Clinical Data:  Urosepsis.   Obstructing right proximal ureteral calculus with hydronephrosis.  RIGHT PERCUTANEOUS NEPHROSTOMY CATHETER PLACEMENT UNDER ULTRASOUND AND FLUOROSCOPIC GUIDANCE  Technique: The procedure, risks (including but not limited to bleeding, infection, organ damage), benefits, and alternatives were explained to the patient.  Questions regarding the procedure were encouraged and answered.  The patient understands and consents to the procedure. The rightflank region prepped with Betadine, draped in usual sterile fashion, infiltrated locally with 1% lidocaine.The patient was receiving adequate antibiotic prophylactic coverage.  Intravenous Fentanyl and Versed were administered as conscious sedation during continuous cardiorespiratory monitoring by the radiology RN, with a total moderate sedation time of in less than 30 minutes.   Under real-time ultrasound guidance, a 21-gauge trocar needle was advanced into a posterior lower pole calyx. Ultrasound image documentation was saved. No definite to urine could be aspirated. The collecting system was not significantly hydronephrotic. Therefore, under fluoroscopy a 21-gauge micropuncture needle was advanced towards a posterior lower pole radiodense calculus.  A 018 guide wire advanced easily within the renal collecting system. Needle was exchanged over  guidewire for transitional dilator. Contrast injection confirmed appropriate positioning. Catheter was exchanged over a guidewire for a 10 French pigtail catheter, formed centrally within the right renal collecting system. Contrast injection confirms appropriate positioning and patency. Catheter secured externally with 0 Prolene suture and placed to external drain bag. No immediate complication.  Fluoroscopy time: 2 minutes 6seconds  IMPRESSION Technically successful right percutaneous nephrostomy catheter placement.   Original Report Authenticated By: D. Andria Rhein, MD   Ir US Guide Bx Asp/drain  01/05/2013   *RADIOLOGY REPORT*   Clinical Data:  Urosepsis.  Obstructing right proximal ureteral calculus with hydronephrosis.  RIGHT PERCUTANEOUS NEPHROSTOMY CATHETER PLACEMENT UNDER ULTRASOUND AND FLUOROSCOPIC GUIDANCE  Technique: The  procedure, risks (including but not limited to bleeding, infection, organ damage), benefits, and alternatives were explained to the patient.  Questions regarding the procedure were encouraged and answered.  The patient understands and consents to the procedure. The rightflank region prepped with Betadine, draped in usual sterile fashion, infiltrated locally with 1% lidocaine.The patient was receiving adequate antibiotic prophylactic coverage.  Intravenous Fentanyl and Versed were administered as conscious sedation during continuous cardiorespiratory monitoring by the radiology RN, with a total moderate sedation time of in less than 30 minutes.   Under real-time ultrasound guidance, a 21-gauge trocar needle was advanced into a posterior lower pole calyx. Ultrasound image documentation was saved. No definite to urine could be aspirated. The collecting system was not significantly hydronephrotic. Therefore, under fluoroscopy a 21-gauge micropuncture needle was advanced towards a posterior lower pole radiodense calculus.  A 018 guide wire advanced easily within the renal collecting system. Needle was exchanged over  guidewire for transitional dilator. Contrast injection confirmed appropriate positioning. Catheter was exchanged over a guidewire for a 10 French pigtail catheter, formed centrally within the right renal collecting system. Contrast injection confirms appropriate positioning and patency. Catheter secured externally with 0 Prolene suture and placed to external drain bag. No immediate complication.  Fluoroscopy time: 2 minutes 6seconds  IMPRESSION Technically successful right percutaneous nephrostomy catheter placement.   Original Report Authenticated By: D. Andria Rhein, MD   Dg Chest Portable 1  View  01/20/2013   *RADIOLOGY REPORT*  Clinical Data: Fever and dehydration  PORTABLE CHEST - 1 VIEW  Comparison: Prior radiograph from 01/03/2013  Findings: Cardiac and mediastinal silhouettes are unchanged.  The lungs are per inflated.  Perihilar linear opacities, most prompt within the right perihilar region are again seen, most consistent with scarring.  No new focal infiltrate is identified. There is no pulmonary edema or pleural effusion.  No pneumothorax.  Pigtail catheter is partially seen in the right abdomen.  No acute osseous abnormality.  IMPRESSION: Similar appearance of the lungs with persistent hyperinflation and right upper lobe parenchymal scarring.  No acute cardiopulmonary process identified.   Original Report Authenticated By: Rise Mu, M.D.   Dg Chest Portable 1 View  01/03/2013   *RADIOLOGY REPORT*  Clinical Data: Fever, dizziness and weakness.  PORTABLE CHEST - 1 VIEW  Comparison: Single view of the chest 10/12/2012 and 09/15/2012.  Findings: Again seen is right upper lobe scarring.  The lungs appear emphysematous.  No pneumothorax or pleural effusion is identified.  Heart size is normal.  IMPRESSION:  1.  No acute finding. 2.  Pulmonary hyperinflation and right upper lobe scarring, unchanged.   Original Report Authenticated By: Holley Dexter, M.D.   Dg Hip Portable 1 View Right  01/20/2013   *RADIOLOGY REPORT*  Clinical Data: Fever.  Decubitus wound.  PORTABLE RIGHT HIP - 1 VIEW  Comparison: CT abdomen pelvis 01/03/2013.  Findings: Single view obtained due to patient contracture.  There is chronic subluxation of the right hip posteriorly.  Subchondral sclerosis of the right femoral head, consistent with osteonecrosis. There is soft tissue heterotopic ossification about the hip joint.  Skin thickening in the right gluteal region.  No foreign body or osseous erosion is seen.  IMPRESSION: 1.  Limited exam due to patient contracture.  2.  No evidence of osseous infection.  3.   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   Original Report Authenticated By: Tiburcio Pea    Microbiology: Recent Results (from the past 240 hour(s))  URINE CULTURE  Status: None   Collection Time    01/20/13  2:28 AM      Result Value Range Status   Specimen Description URINE, CATHETERIZED   Final   Special Requests NONE   Final   Culture  Setup Time     Final   Value: 01/20/2013 10:09     Performed at [facility] Count     Final   Value: NO GROWTH     Performed at [facility]   Culture     Final   Value: NO GROWTH     Performed at [facility]   Report Status 01/21/2013 FINAL   Final  CULTURE, BLOOD (ROUTINE X 2)     Status: None   Collection Time    01/20/13  4:20 AM      Result Value Range Status   Specimen Description BLOOD RIGHT HAND   Final   Special Requests BOTTLES DRAWN AEROBIC AND ANAEROBIC 5 CC EACH   Final   Culture  Setup Time     Final   Value: 01/20/2013 10:07     Performed at [facility]   Culture     Final   Value:        BLOOD CULTURE RECEIVED NO GROWTH TO DATE CULTURE WILL BE HELD FOR 5 DAYS BEFORE ISSUING A FINAL NEGATIVE REPORT     Performed at [facility]   Report Status PENDING   Incomplete  CULTURE, BLOOD (ROUTINE X 2)     Status: None   Collection Time    01/20/13  4:30 AM      Result Value Range Status   Specimen Description BLOOD LEFT HAND   Final   Special Requests BOTTLES DRAWN AEROBIC AND ANAEROBIC 5CC EACH   Final   Culture  Setup Time     Final   Value: 01/20/2013 10:07     Performed at [facility]   Culture     Final   Value:        BLOOD CULTURE RECEIVED NO GROWTH TO DATE CULTURE WILL BE HELD FOR 5 DAYS BEFORE ISSUING A FINAL NEGATIVE REPORT     Performed at [facility]   Report Status PENDING   Incomplete  MRSA PCR SCREENING     Status: None   Collection Time    01/20/13  6:45 AM      Result Value Range Status   MRSA by PCR NEGATIVE   NEGATIVE Final   Comment:            The GeneXpert MRSA Assay (FDA     approved for NASAL specimens     only), is one component of a     comprehensive MRSA colonization     surveillance program. It is not     intended to diagnose MRSA     infection nor to guide or     monitor treatment for     MRSA infections.     Labs: Basic Metabolic Panel:  Recent Labs Lab 01/20/13 0118 01/20/13 0225  NA 134*  --   K 4.6  --   CL 99  --   CO2 20  --   GLUCOSE 95  --   BUN 18  --   CREATININE 0.60 0.66  CALCIUM 9.1  --    Liver Function Tests:  Recent Labs Lab 01/20/13 0118  AST 19  ALT 8  ALKPHOS 98  BILITOT 0.5  PROT 9.2*  ALBUMIN 2.5*   No results found for this basename: LIPASE, AMYLASE,  in the last 168 hours No results found for this basename: AMMONIA,  in the last 168 hours CBC:  Recent Labs Lab 01/20/13 0225  WBC 9.9  NEUTROABS 6.7  HGB 8.8*  HCT 27.2*  MCV 77.1*  PLT 625*   Cardiac Enzymes: No results found for this basename: CKTOTAL, CKMB, CKMBINDEX, TROPONINI,  in the last 168 hours BNP: BNP (last 3 results) No results found for this basename: PROBNP,  in the last 8760 hours CBG: No results found for this basename: GLUCAP,  in the last 168 hours  Time coordinating discharge: Over 30 minutes

## 2013-01-24 NOTE — Progress Notes (Signed)
Spoke with POA  Ms Dareen Piano who verified she will be present in am to sign papers.  Instructions reviewed and verbalized understanding

## 2013-01-25 ENCOUNTER — Encounter (HOSPITAL_COMMUNITY): Payer: Self-pay | Admitting: Anesthesiology

## 2013-01-25 ENCOUNTER — Encounter (HOSPITAL_COMMUNITY): Payer: Self-pay | Admitting: *Deleted

## 2013-01-25 ENCOUNTER — Ambulatory Visit (HOSPITAL_COMMUNITY): Payer: BC Managed Care – PPO | Admitting: Anesthesiology

## 2013-01-25 ENCOUNTER — Inpatient Hospital Stay (HOSPITAL_COMMUNITY)
Admission: RE | Admit: 2013-01-25 | Discharge: 2013-01-30 | DRG: 567 | Disposition: A | Discharge status: Home / Self Care | Payer: BC Managed Care – PPO | Source: Ambulatory Visit | Attending: Urology | Admitting: Urology

## 2013-01-25 ENCOUNTER — Ambulatory Visit (HOSPITAL_COMMUNITY): Payer: BC Managed Care – PPO

## 2013-01-25 ENCOUNTER — Encounter (HOSPITAL_COMMUNITY)
Admission: RE | Disposition: A | Discharge status: Home / Self Care | Payer: Self-pay | Source: Ambulatory Visit | Attending: Urology

## 2013-01-25 DIAGNOSIS — E871 Hypo-osmolality and hyponatremia: Secondary | ICD-10-CM | POA: Diagnosis present

## 2013-01-25 DIAGNOSIS — R319 Hematuria, unspecified: Secondary | ICD-10-CM | POA: Diagnosis not present

## 2013-01-25 DIAGNOSIS — D649 Anemia, unspecified: Secondary | ICD-10-CM | POA: Diagnosis present

## 2013-01-25 DIAGNOSIS — L89209 Pressure ulcer of unspecified hip, unspecified stage: Secondary | ICD-10-CM | POA: Diagnosis present

## 2013-01-25 DIAGNOSIS — G35 Multiple sclerosis: Secondary | ICD-10-CM

## 2013-01-25 DIAGNOSIS — R000 Tachycardia, unspecified: Secondary | ICD-10-CM | POA: Diagnosis present

## 2013-01-25 DIAGNOSIS — D62 Acute posthemorrhagic anemia: Secondary | ICD-10-CM | POA: Diagnosis not present

## 2013-01-25 DIAGNOSIS — I498 Other specified cardiac arrhythmias: Secondary | ICD-10-CM | POA: Diagnosis present

## 2013-01-25 DIAGNOSIS — M62838 Other muscle spasm: Secondary | ICD-10-CM | POA: Diagnosis present

## 2013-01-25 DIAGNOSIS — A419 Sepsis, unspecified organism: Secondary | ICD-10-CM | POA: Diagnosis present

## 2013-01-25 DIAGNOSIS — R627 Adult failure to thrive: Secondary | ICD-10-CM

## 2013-01-25 DIAGNOSIS — I472 Ventricular tachycardia, unspecified: Secondary | ICD-10-CM | POA: Diagnosis not present

## 2013-01-25 DIAGNOSIS — Z87442 Personal history of urinary calculi: Secondary | ICD-10-CM

## 2013-01-25 DIAGNOSIS — D509 Iron deficiency anemia, unspecified: Secondary | ICD-10-CM | POA: Diagnosis present

## 2013-01-25 DIAGNOSIS — N200 Calculus of kidney: Secondary | ICD-10-CM | POA: Diagnosis present

## 2013-01-25 DIAGNOSIS — I4729 Other ventricular tachycardia: Secondary | ICD-10-CM | POA: Diagnosis not present

## 2013-01-25 DIAGNOSIS — N319 Neuromuscular dysfunction of bladder, unspecified: Secondary | ICD-10-CM | POA: Diagnosis present

## 2013-01-25 DIAGNOSIS — L8995 Pressure ulcer of unspecified site, unstageable: Secondary | ICD-10-CM | POA: Diagnosis present

## 2013-01-25 DIAGNOSIS — G8220 Paraplegia, unspecified: Secondary | ICD-10-CM | POA: Diagnosis present

## 2013-01-25 DIAGNOSIS — Z515 Encounter for palliative care: Secondary | ICD-10-CM

## 2013-01-25 DIAGNOSIS — Z87440 Personal history of urinary (tract) infections: Secondary | ICD-10-CM

## 2013-01-25 DIAGNOSIS — Z7401 Bed confinement status: Secondary | ICD-10-CM

## 2013-01-25 DIAGNOSIS — L89109 Pressure ulcer of unspecified part of back, unspecified stage: Secondary | ICD-10-CM | POA: Diagnosis present

## 2013-01-25 DIAGNOSIS — Z681 Body mass index (BMI) 19 or less, adult: Secondary | ICD-10-CM

## 2013-01-25 DIAGNOSIS — N201 Calculus of ureter: Principal | ICD-10-CM | POA: Diagnosis present

## 2013-01-25 DIAGNOSIS — N3289 Other specified disorders of bladder: Secondary | ICD-10-CM | POA: Diagnosis present

## 2013-01-25 DIAGNOSIS — R339 Retention of urine, unspecified: Secondary | ICD-10-CM | POA: Diagnosis present

## 2013-01-25 DIAGNOSIS — E876 Hypokalemia: Secondary | ICD-10-CM | POA: Diagnosis not present

## 2013-01-25 DIAGNOSIS — Z66 Do not resuscitate: Secondary | ICD-10-CM | POA: Diagnosis present

## 2013-01-25 DIAGNOSIS — E559 Vitamin D deficiency, unspecified: Secondary | ICD-10-CM | POA: Diagnosis present

## 2013-01-25 HISTORY — DX: Hypo-osmolality and hyponatremia: E87.1

## 2013-01-25 HISTORY — PX: NEPHROLITHOTOMY: SHX5134

## 2013-01-25 HISTORY — DX: Myoneural disorder, unspecified: G70.9

## 2013-01-25 LAB — BASIC METABOLIC PANEL
BUN: 15 mg/dL (ref 6–23)
CO2: 29 mEq/L (ref 19–32)
CO2: 28 mEq/L (ref 19–32)
Calcium: 9.3 mg/dL (ref 8.4–10.5)
Chloride: 99 mEq/L (ref 96–112)
Chloride: 98 mEq/L (ref 96–112)
Creatinine, Ser: 0.68 mg/dL (ref 0.50–1.35)
Creatinine, Ser: 0.68 mg/dL (ref 0.50–1.35)
GFR calc Af Amer: >90 mL/min (ref >90)
Glucose, Bld: 109 mg/dL — ABNORMAL HIGH (ref 70–99)

## 2013-01-25 LAB — CBC
HCT: 33.2 % — ABNORMAL LOW (ref 39.0–52.0)
Hemoglobin: 10.3 g/dL — ABNORMAL LOW (ref 13.0–17.0)
MCH: 24 pg — ABNORMAL LOW (ref 26.0–34.0)
MCV: 77.2 fL — ABNORMAL LOW (ref 78.0–100.0)
RBC: 4.3 MIL/uL (ref 4.22–5.81)
WBC: 9.9 10*3/uL (ref 4.0–10.5)

## 2013-01-25 SURGERY — NEPHROLITHOTOMY PERCUTANEOUS
Anesthesia: General | Site: Flank | Laterality: Right | Wound class: Clean Contaminated

## 2013-01-25 MED ORDER — INTERFERON BETA-1B 0.3 MG ~~LOC~~ KIT
0.2500 mg | PACK | SUBCUTANEOUS | Status: DC
  Administered 2013-01-25 – 2013-01-29 (×3): 0.25 mg via SUBCUTANEOUS

## 2013-01-25 MED ORDER — PROMETHAZINE HCL 25 MG/ML IJ SOLN: 6.2500 mg | INTRAMUSCULAR | Status: DC | PRN

## 2013-01-25 MED ORDER — ONDANSETRON HCL 4 MG/2ML IJ SOLN: 4.0000 mg | INTRAMUSCULAR | Status: DC | PRN

## 2013-01-25 MED ORDER — CIPROFLOXACIN IN D5W 400 MG/200ML IV SOLN
400.0000 mg | INTRAVENOUS | Status: AC
  Administered 2013-01-25: 400 mg via INTRAVENOUS

## 2013-01-25 MED ORDER — BISACODYL 10 MG RE SUPP: 10.0000 mg | Freq: Every day | RECTAL | Status: DC | PRN

## 2013-01-25 MED ORDER — LIDOCAINE HCL (CARDIAC) 20 MG/ML IV SOLN
INTRAVENOUS | Status: DC | PRN
  Administered 2013-01-25: 50 mg via INTRAVENOUS

## 2013-01-25 MED ORDER — LACTATED RINGERS IV SOLN: INTRAVENOUS | Status: DC

## 2013-01-25 MED ORDER — PROPOFOL 10 MG/ML IV BOLUS
INTRAVENOUS | Status: DC | PRN
  Administered 2013-01-25: 120 mg via INTRAVENOUS
  Administered 2013-01-25: 20 mg via INTRAVENOUS

## 2013-01-25 MED ORDER — CIPROFLOXACIN IN D5W 400 MG/200ML IV SOLN
INTRAVENOUS | Status: AC
  Filled 2013-01-25: qty 200

## 2013-01-25 MED ORDER — ADULT MULTIVITAMIN W/MINERALS CH
1.0000 | ORAL_TABLET | Freq: Every day | ORAL | Status: DC
  Administered 2013-01-25 – 2013-01-30 (×6): 1 via ORAL
  Filled 2013-01-25 (×6): qty 1

## 2013-01-25 MED ORDER — TIZANIDINE HCL 4 MG PO TABS
4.0000 mg | ORAL_TABLET | Freq: Four times a day (QID) | ORAL | Status: DC
  Administered 2013-01-25 – 2013-01-30 (×19): 4 mg via ORAL
  Filled 2013-01-25 (×21): qty 1

## 2013-01-25 MED ORDER — SODIUM CHLORIDE 0.9 % IV SOLN
10.0000 mg | INTRAVENOUS | Status: DC | PRN
  Administered 2013-01-25: 50 ug/min via INTRAVENOUS

## 2013-01-25 MED ORDER — SUFENTANIL CITRATE 50 MCG/ML IV SOLN
INTRAVENOUS | Status: DC | PRN
  Administered 2013-01-25 (×3): 5 ug via INTRAVENOUS
  Administered 2013-01-25: 7.5 ug via INTRAVENOUS
  Administered 2013-01-25: 2.5 ug via INTRAVENOUS

## 2013-01-25 MED ORDER — DOCUSATE SODIUM 100 MG PO CAPS
100.0000 mg | ORAL_CAPSULE | Freq: Two times a day (BID) | ORAL | Status: DC
  Administered 2013-01-25 – 2013-01-30 (×9): 100 mg via ORAL
  Filled 2013-01-25 (×11): qty 1

## 2013-01-25 MED ORDER — ENSURE COMPLETE PO LIQD
237.0000 mL | Freq: Three times a day (TID) | ORAL | Status: DC
  Administered 2013-01-25 – 2013-01-30 (×14): 237 mL via ORAL

## 2013-01-25 MED ORDER — HYDROCODONE-ACETAMINOPHEN 5-325 MG PO TABS
1.0000 | ORAL_TABLET | ORAL | Status: DC | PRN
  Administered 2013-01-26 (×2): 1 via ORAL
  Administered 2013-01-26 – 2013-01-27 (×2): 2 via ORAL
  Filled 2013-01-25: qty 1
  Filled 2013-01-25 (×2): qty 2
  Filled 2013-01-25: qty 1

## 2013-01-25 MED ORDER — DEXTROSE 5 % IV SOLN
INTRAVENOUS | Status: DC | PRN
  Administered 2013-01-25: 13:00:00 via INTRAVENOUS

## 2013-01-25 MED ORDER — ROCURONIUM BROMIDE 100 MG/10ML IV SOLN
INTRAVENOUS | Status: DC | PRN
  Administered 2013-01-25: 30 mg via INTRAVENOUS

## 2013-01-25 MED ORDER — COLLAGENASE 250 UNIT/GM EX OINT
TOPICAL_OINTMENT | Freq: Every day | CUTANEOUS | Status: DC
  Administered 2013-01-26 – 2013-01-28 (×3): via TOPICAL
  Administered 2013-01-29: 1 via TOPICAL
  Filled 2013-01-25 (×2): qty 30

## 2013-01-25 MED ORDER — IOHEXOL 300 MG/ML  SOLN
INTRAMUSCULAR | Status: DC | PRN
  Administered 2013-01-25: 50 mL via INTRAVENOUS

## 2013-01-25 MED ORDER — FENTANYL CITRATE 0.05 MG/ML IJ SOLN: 25.0000 ug | INTRAMUSCULAR | Status: DC | PRN

## 2013-01-25 MED ORDER — EZETIMIBE 10 MG PO TABS
10.0000 mg | ORAL_TABLET | Freq: Every day | ORAL | Status: DC
  Administered 2013-01-25 – 2013-01-29 (×5): 10 mg via ORAL
  Filled 2013-01-25 (×6): qty 1

## 2013-01-25 MED ORDER — INTERFERON BETA-1B 0.3 MG ~~LOC~~ KIT: 0.2500 mg | PACK | SUBCUTANEOUS | Status: DC

## 2013-01-25 MED ORDER — PHENYLEPHRINE HCL 10 MG/ML IJ SOLN
INTRAMUSCULAR | Status: DC | PRN
  Administered 2013-01-25 (×4): 80 ug via INTRAVENOUS

## 2013-01-25 MED ORDER — DANTROLENE SODIUM 25 MG PO CAPS
25.0000 mg | ORAL_CAPSULE | Freq: Three times a day (TID) | ORAL | Status: DC
  Administered 2013-01-25 – 2013-01-30 (×14): 25 mg via ORAL
  Filled 2013-01-25 (×16): qty 1

## 2013-01-25 MED ORDER — SODIUM CHLORIDE 0.9 % IR SOLN
Status: DC | PRN
  Administered 2013-01-25: 8000 mL

## 2013-01-25 MED ORDER — GABAPENTIN 600 MG PO TABS: 600.0000 mg | ORAL_TABLET | Freq: Four times a day (QID) | ORAL | Status: DC

## 2013-01-25 MED ORDER — MODAFINIL 200 MG PO TABS
200.0000 mg | ORAL_TABLET | Freq: Two times a day (BID) | ORAL | Status: DC
  Administered 2013-01-25 – 2013-01-27 (×4): 200 mg via ORAL
  Filled 2013-01-25 (×3): qty 1

## 2013-01-25 MED ORDER — DEXAMETHASONE SODIUM PHOSPHATE 10 MG/ML IJ SOLN
INTRAMUSCULAR | Status: DC | PRN
  Administered 2013-01-25: 8 mg via INTRAVENOUS

## 2013-01-25 MED ORDER — JUVEN PO PACK
1.0000 | PACK | Freq: Two times a day (BID) | ORAL | Status: DC
  Administered 2013-01-26 – 2013-01-30 (×8): 1 via ORAL
  Filled 2013-01-25 (×12): qty 1

## 2013-01-25 MED ORDER — ZOLPIDEM TARTRATE 5 MG PO TABS: 5.0000 mg | ORAL_TABLET | Freq: Every evening | ORAL | Status: DC | PRN

## 2013-01-25 MED ORDER — KCL IN DEXTROSE-NACL 20-5-0.45 MEQ/L-%-% IV SOLN
INTRAVENOUS | Status: DC
  Administered 2013-01-25 – 2013-01-26 (×2): via INTRAVENOUS
  Filled 2013-01-25 (×4): qty 1000

## 2013-01-25 MED ORDER — LACTATED RINGERS IV SOLN
INTRAVENOUS | Status: DC
  Administered 2013-01-25: 1000 mL via INTRAVENOUS

## 2013-01-25 MED ORDER — AMANTADINE HCL 100 MG PO CAPS
100.0000 mg | ORAL_CAPSULE | Freq: Two times a day (BID) | ORAL | Status: DC
  Administered 2013-01-25 – 2013-01-30 (×10): 100 mg via ORAL
  Filled 2013-01-25 (×11): qty 1

## 2013-01-25 MED ORDER — ACETAMINOPHEN 325 MG PO TABS: 650.0000 mg | ORAL_TABLET | ORAL | Status: DC | PRN

## 2013-01-25 MED ORDER — GABAPENTIN 300 MG PO CAPS
600.0000 mg | ORAL_CAPSULE | Freq: Four times a day (QID) | ORAL | Status: DC
  Administered 2013-01-25 – 2013-01-30 (×19): 600 mg via ORAL
  Filled 2013-01-25 (×21): qty 2

## 2013-01-25 MED ORDER — HYDROMORPHONE HCL PF 1 MG/ML IJ SOLN: 0.5000 mg | INTRAMUSCULAR | Status: DC | PRN

## 2013-01-25 MED ORDER — CIPROFLOXACIN IN D5W 200 MG/100ML IV SOLN
200.0000 mg | Freq: Two times a day (BID) | INTRAVENOUS | Status: DC
  Administered 2013-01-26 – 2013-01-29 (×8): 200 mg via INTRAVENOUS
  Filled 2013-01-25 (×8): qty 100

## 2013-01-25 SURGICAL SUPPLY — 53 items
APL SKNCLS STERI-STRIP NONHPOA (GAUZE/BANDAGES/DRESSINGS) ×2
BAG URINE DRAINAGE (UROLOGICAL SUPPLIES) ×2 IMPLANT
BASKET ZERO TIP NITINOL 2.4FR (BASKET) ×2 IMPLANT
BENZOIN TINCTURE PRP APPL 2/3 (GAUZE/BANDAGES/DRESSINGS) ×6 IMPLANT
BLADE SURG 15 STRL LF DISP TIS (BLADE) ×1 IMPLANT
BLADE SURG 15 STRL SS (BLADE) ×2
BSKT STON RTRVL ZERO TP 2.4FR (BASKET) ×1
CATCHER STONE W/TUBE ADAPTER (UROLOGICAL SUPPLIES) ×1 IMPLANT
CATH AINSWORTH 30CC 24FR (CATHETERS) ×1 IMPLANT
CATH BEACON 5.038 65CM KMP-01 (CATHETERS) ×2 IMPLANT
CATH FOLEY 2W COUNCIL 20FR 5CC (CATHETERS) ×1 IMPLANT
CATH ROBINSON RED A/P 20FR (CATHETERS) IMPLANT
CATH URET 5FR 28IN OPEN ENDED (CATHETERS) ×1 IMPLANT
CATH X-FORCE N30 NEPHROSTOMY (TUBING) ×2 IMPLANT
CLOTH BEACON ORANGE TIMEOUT ST (SAFETY) ×2 IMPLANT
COVER SURGICAL LIGHT HANDLE (MISCELLANEOUS) ×2 IMPLANT
DRAPE C-ARM 42X120 X-RAY (DRAPES) ×2 IMPLANT
DRAPE CAMERA CLOSED 9X96 (DRAPES) ×2 IMPLANT
DRAPE LINGEMAN PERC (DRAPES) ×2 IMPLANT
DRAPE SURG IRRIG POUCH 19X23 (DRAPES) ×2 IMPLANT
DRSG PAD ABDOMINAL 8X10 ST (GAUZE/BANDAGES/DRESSINGS) ×1 IMPLANT
DRSG TEGADERM 8X12 (GAUZE/BANDAGES/DRESSINGS) ×4 IMPLANT
GLOVE SURG SS PI 8.0 STRL IVOR (GLOVE) ×2 IMPLANT
GOWN STRL REIN XL XLG (GOWN DISPOSABLE) ×2 IMPLANT
GUIDEWIRE STR DUAL SENSOR (WIRE) ×1 IMPLANT
GYRUS RUMI II 2.5CM BLUE (DISPOSABLE) ×2
IV NS 1000ML (IV SOLUTION) ×10
IV NS 1000ML BAXH (IV SOLUTION) IMPLANT
IV NS IRRIG 3000ML ARTHROMATIC (IV SOLUTION) ×2 IMPLANT
KIT BASIN OR (CUSTOM PROCEDURE TRAY) ×2 IMPLANT
LASER FIBER DISP (UROLOGICAL SUPPLIES) ×1 IMPLANT
LASER FIBER DISP 1000U (UROLOGICAL SUPPLIES) IMPLANT
MANIFOLD NEPTUNE II (INSTRUMENTS) ×2 IMPLANT
NS IRRIG 1000ML POUR BTL (IV SOLUTION) ×2 IMPLANT
PACK BASIC VI WITH GOWN DISP (CUSTOM PROCEDURE TRAY) ×2 IMPLANT
PAD ABD 7.5X8 STRL (GAUZE/BANDAGES/DRESSINGS) ×4 IMPLANT
PROBE LITHOCLAST ULTRA 3.8X403 (UROLOGICAL SUPPLIES) ×1 IMPLANT
PROBE PNEUMATIC 1.0MMX570MM (UROLOGICAL SUPPLIES) ×1 IMPLANT
RUMI II GYRUS 2.5CM BLUE (DISPOSABLE) IMPLANT
SET IRRIG Y TYPE TUR BLADDER L (SET/KITS/TRAYS/PACK) ×2 IMPLANT
SET WARMING FLUID IRRIGATION (MISCELLANEOUS) ×1 IMPLANT
SHEATH PEELAWAY SET 9 (SHEATH) ×1 IMPLANT
SPONGE GAUZE 4X4 12PLY (GAUZE/BANDAGES/DRESSINGS) ×2 IMPLANT
SPONGE LAP 4X18 X RAY DECT (DISPOSABLE) ×2 IMPLANT
STONE CATCHER W/TUBE ADAPTER (UROLOGICAL SUPPLIES) IMPLANT
SUT SILK 2 0 30  PSL (SUTURE) ×1
SUT SILK 2 0 30 PSL (SUTURE) ×1 IMPLANT
SYR 20CC LL (SYRINGE) ×4 IMPLANT
SYRINGE 10CC LL (SYRINGE) ×2 IMPLANT
TAPE CLOTH SURG 4X10 WHT LF (GAUZE/BANDAGES/DRESSINGS) ×1 IMPLANT
TOWEL OR NON WOVEN STRL DISP B (DISPOSABLE) ×2 IMPLANT
TRAY FOLEY CATH 14FRSI W/METER (CATHETERS) ×1 IMPLANT
TUBING CONNECTING 10 (TUBING) ×6 IMPLANT

## 2013-01-25 NOTE — Preoperative (Signed)
Beta Blockers   Reason not to administer Beta Blockers:Not Applicable 

## 2013-01-25 NOTE — Interval H&P Note (Signed)
History and Physical Interval Note:   He had a right perc placed and has recovered from his acute sepsis and is to have definitive stone treatment today.  01/25/2013 1:05 PM  [name] Mccoy  has presented today for surgery, with the diagnosis of right renal and ureteral stones   The various methods of treatment have been discussed with the patient and family. After consideration of risks, benefits and other options for treatment, the patient has consented to  Procedure(s): RIGHT PERCUTANEOUS NEPHROLITHOTOMY  (Right) as a surgical intervention .  The patient's history has been reviewed, patient examined, no change in status, stable for surgery.  I have reviewed the patient's chart and labs.  Questions were answered to the patient's satisfaction.     Herndon Grill J

## 2013-01-25 NOTE — Anesthesia Preprocedure Evaluation (Addendum)
Anesthesia Evaluation  Patient identified by MRN, date of birth, ID band Patient awake    Reviewed: Allergy & Precautions, H&P , NPO status , Patient's Chart, lab work & pertinent test results  Airway Mallampati: II TM Distance: >3 FB Neck ROM: Full    Dental no notable dental hx. (+) Teeth Intact and Dental Advisory Given   Pulmonary neg pulmonary ROS,  breath sounds clear to auscultation  Pulmonary exam normal       Cardiovascular negative cardio ROS  Rhythm:Regular Rate:Normal     Neuro/Psych Hx of altered mental statusMultiple sclerosis  Neuromuscular disease negative psych ROS   GI/Hepatic negative GI ROS, Neg liver ROS,   Endo/Other  negative endocrine ROS  Renal/GU Renal disease  negative genitourinary   Musculoskeletal negative musculoskeletal ROS (+)   Abdominal   Peds negative pediatric ROS (+)  Hematology negative hematology ROS (+)   Anesthesia Other Findings Emaciated. Appears chronically ill  Reproductive/Obstetrics negative OB ROS                          Anesthesia Physical Anesthesia Plan  ASA: III  Anesthesia Plan: General   Post-op Pain Management:    Induction: Intravenous  Airway Management Planned: Oral ETT  Additional Equipment:   Intra-op Plan:   Post-operative Plan: Extubation in OR  Informed Consent: I have reviewed the patients History and Physical, chart, labs and discussed the procedure including the risks, benefits and alternatives for the proposed anesthesia with the patient or authorized representative who has indicated his/her understanding and acceptance.   Dental advisory given  Plan Discussed with: CRNA  Anesthesia Plan Comments:         Anesthesia Quick Evaluation

## 2013-01-25 NOTE — Progress Notes (Signed)
Patient has bed sores on his right hip and left cheek areas. These are covered with dressings which are dry  The patient states the sores stay dry and do not drain.

## 2013-01-25 NOTE — H&P (View-Only) (Signed)
   Subjective: I was asked to see [name] in consultation by Dr. Byrum for a right proximal stone with obstruction and urosepsis.   [name] is a 52 yo BM with a 30+ year history of MS.  He has never had prior urologic care.   He was admitted to the ICU via the ER after being brought in with the onset over the last 24 hrs of fever to 104 and delirium.   He was found on CT scanning to have a 1.3cm right proximal stone with a right renal stone and bilateral hydro with a distended bladder.   He has been managed with chronic foley catheter drainage but an attempt to change the catheter in the ER was unsuccessful and I&O cath only produced some debris.   On my review of the CT the prostate is very hazy with a possible large fossa that would suggest the foley had been in the prostatic urethra and he may have prostatitis as well.   Apart from the fever and delirium he has had no other significant complaints.  ROS: Negative except as above.   On a full review he denies flank pain, hematuria, abdominal pain, chest pain or shortness of breath.  He has had no nausea.   His mental confusion has resolved.   He reports intact sensation but is bed bound with contracted extremities with bilateral hip fractures on CT but no pain.     Past Medical History  Diagnosis Date  . Multiple sclerosis   . Pressure ulcer, buttock(707.05)   . Pain in joint, pelvic region and thigh   . Spasm of muscle   . Unspecified vitamin D deficiency   . Disturbance of skin sensation   . Right ureteral stone 01/03/13  . Renal stones   . Neurogenic bladder   . Urinary retention with incomplete bladder emptying    Past Surgical History  Procedure Laterality Date  . Wound debridement Right 10/15/2012    Procedure: DEBRIDMENT OF RIGHT HIP DECUBITUS AND SACRAL DECUBITUS;  Surgeon: Todd M Gerkin, MD;  Location: [facility];  Service: General;  Laterality: Right;  left lateral position, right hip   History   Social History  . Marital  Status: Single    Spouse Name: N/A    Number of Children: N/A  . Years of Education: N/A   Occupational History  . Not on file.   Social History Main Topics  . Smoking status: Never Smoker   . Smokeless tobacco: Never Used  . Alcohol Use: No  . Drug Use: No  . Sexual Activity: No   Other Topics Concern  . Not on file   Social History Narrative   Patient is single and lives at home alone. Patient is disabled.   Family History  Problem Relation Age of Onset  . Hypertension Mother   . Dementia Mother   . Hypertension Father   . Heart disease Father    No Known Allergies  Meds reviewed below.     Objective: Vital signs in last 24 hours: Temp:  [101.4 F (38.6 C)-104.9 F (40.5 C)] 101.4 F (38.6 C) (08/18 1111) Pulse Rate:  [128-162] 129 (08/18 1115) Resp:  [12-28] 20 (08/18 1207) BP: (72-110)/(45-69) 72/45 mmHg (08/18 1207) SpO2:  [95 %-98 %] 95 % (08/18 1115) Weight:  [60.782 kg (134 lb)-65 kg (143 lb 4.8 oz)] 65 kg (143 lb 4.8 oz) (08/18 1207)  Intake/Output from previous day:   Intake/Output this shift: Total I/O In:   102.1 [I.V.:102.1] Out: 550 [Urine:550]  General appearance: alert and no distress Head: Normocephalic, without obvious abnormality, atraumatic Neck: no adenopathy, no carotid bruit, no JVD, supple, symmetrical, trachea midline and thyroid not enlarged, symmetric, no tenderness/mass/nodules Resp: clear to auscultation bilaterally Cardio: tachycardic with a normal rhythm.  No murmurs noted.  GI: soft, non-tender; bowel sounds normal; no masses,  no organomegaly Male genitalia: normal, but difficulty to examine because of the contracted extremities.   He is circumcised and the meatus is normal.  The scrotum is normal without swelling or erythema and the testes and epididymes are normal without mass or tenderness.  Rectal exam demonstrates decreased tone, no rectal masses and the prostate is small and some.  the SV's are non-palpable.   Extremities: normal UE's and contracted withered LE's.  Skin: warm and dry with ischial decubiti.  Lymph nodes: Cervical, supraclavicular, and axillary nodes normal. Neurologic: Mental status: Alert, oriented, thought content appropriate Sensory: normal Motor: paraplegia with spasticity.    Lab Results:   Recent Labs  01/03/13 0441  WBC 11.1*  HGB 9.8*  HCT 29.8*  PLT 360   BMET  Recent Labs  01/03/13 0441  NA 131*  K 4.5  CL 97  CO2 19  GLUCOSE 167*  BUN 70*  CREATININE 3.62*  CALCIUM 7.8*   PT/INR  Recent Labs  01/03/13 0441  LABPROT 16.7*  INR 1.39   ABG No results found for this basename: PHART, PCO2, PO2, HCO3,  in the last 72 hours  Studies/Results: Ct Abdomen Pelvis Wo Contrast  01/03/2013   *RADIOLOGY REPORT*  Clinical Data: Generalized weakness.  Question UTI.  Lethargic. Bed sores.  Sore throat.  Multiple sclerosis.  CT ABDOMEN AND PELVIS WITHOUT CONTRAST  Technique:  Multidetector CT imaging of the abdomen and pelvis was performed following the standard protocol without intravenous contrast.  Comparison: 07/28/2011.  Findings: Proximal right ureteral 1.3 cm obstructing stone with mild to moderate right-sided hydronephrosis.  Several nonobstructing right renal calculi noted.  Small nonobstructing left lower pole renal calculi.  There is fullness of the left renal collecting system.  No definitive left ureteral obstructing stone.  Fullness of the collecting systems may be partially explained by bladder outlet obstruction as the bladder is prominent in size. Calcification along the posterior aspect of the bladder wall may reflect changes of chronic distension/inflammation.  Right lung base 3.7 mm nodule (series 4 image 7) not previously imaged. Given risk factors for bronchogenic carcinoma, follow-up chest CT at 1 year is recommended.  This recommendation follows the consensus statement:  Guidelines for Management of Small Pulmonary Nodules Detected on CT Scans:   A Statement from the Fleischner Society as published in Radiology 2005; 237:395-400.  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  No CT evidence of pericholecystic inflammation.  Ultrasound can be obtained for further delineation if this is of clinical concern.  Circumferential thickening rectosigmoid region may be related to under distension.  Inflammation not excluded.  No free intraperitoneal air.  Evaluation of solid abdominal viscera is limited by lack of IV contrast.  Taking this limitation into account:  No worrisome hepatic, splenic, adrenal or pancreatic lesion.  Left femoral neck fracture with fragmentation of the left femoral head and acetabulum.  This is new from the prior exam.  Subluxed/partially dislocated right femoral head with impaction against the posterior acetabulum.  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  Sacral decubiti with sclerotic appearance of the left ilium new   from prior examination.  Osteomyelitis is a possibility.  IMPRESSION:  Proximal right ureteral 1.3 cm obstructing stone with mild to moderate right-sided hydronephrosis.  Several nonobstructing right renal calculi noted.  Small nonobstructing left lower pole renal calculi.  There is fullness of the left renal collecting system.  No definitive left ureteral obstructing stone.  Fullness of the collecting systems may be partially explained by bladder outlet obstruction as the bladder is prominent in size. Calcification along the posterior aspect of the bladder wall may reflect changes of chronic distension/inflammation.  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  No CT evidence of pericholecystic inflammation.  Ultrasound can be obtained for further delineation if this is of clinical concern.  Circumferential thickening rectosigmoid region may be related to under distension.   Inflammation not excluded.  No free intraperitoneal air.  Left femoral neck fracture with fragmentation of the left femoral head and acetabulum.  This is new from the prior exam.  Subluxed/partially dislocated right femoral head with impaction against the posterior acetabulum.  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  Sacral decubiti with sclerotic appearance of the left ilium new from prior examination.  Osteomyelitis is a possibility.  Right lung base 3.7 mm nodule (series 4 image 7) not previously imaged. Follow up as noted above.   Original Report Authenticated By: Steven Olson, M.D.   Us Renal  01/03/2013   *RADIOLOGY REPORT*  Clinical Data: Renal insufficiency  RENAL / URINARY TRACT ULTRASOUND  Technique:  Complete ultrasound exam of the kidneys and urinary bladder was performed.  Comparison: CT scan from 01/03/2013  Findings:  The right kidney measures 13.3  cm in long axis.  The left kidney measures 12.8 cm.  Mild to moderate left hydronephrosis is evident and while comparing across modalities is difficult, the degree of collecting system distention appears to be roughly similar to the previous CT scan. At least two stones in the 5-6 mm size range are seen in the left kidney towards the lower pole.  There is mild to moderate right-sided hydronephrosis with multiple right renal stones evident.  Images in the central anatomic pelvis reveal a distended bladder with posterior bladder wall thickening and intraluminal debris.  Incidental imaging of the gallbladder shows multiple gallstones.  Impression:  Bilateral mild to moderate hydronephrosis with bilateral renal stones.  There is seen to be some associated dilatation of the proximal right ureter although an etiology for the bilateral hydronephrosis is not evident on today's study.  The patient has multiple bilateral renal stones and the obstruction may be secondary to the stone disease.  Posterior bladder wall thickening with debris in  the urinary bladder.  Bladder infection would be a consideration.   Original Report Authenticated By: Eric Mansell, M.D.   Dg Chest Portable 1 View  01/03/2013   *RADIOLOGY REPORT*  Clinical Data: Fever, dizziness and weakness.  PORTABLE CHEST - 1 VIEW  Comparison: Single view of the chest 10/12/2012 and 09/15/2012.  Findings: Again seen is right upper lobe scarring.  The lungs appear emphysematous.  No pneumothorax or pleural effusion is identified.  Heart size is normal.  IMPRESSION:  1.  No acute finding. 2.  Pulmonary hyperinflation and right upper lobe scarring, unchanged.   Original Report Authenticated By: Thomas D'Alessio, M.D.    Anti-infectives: Anti-infectives   Start     Dose/Rate Route Frequency Ordered Stop   01/04/13 1000  vancomycin (VANCOCIN) IVPB 750 mg/150 ml premix       750 mg 150 mL/hr over 60 Minutes Intravenous Every 24 hours 01/03/13 1207     01/03/13 1600  piperacillin-tazobactam (ZOSYN) IVPB 3.375 g     3.375 g 12.5 mL/hr over 240 Minutes Intravenous Every 8 hours 01/03/13 1159     01/03/13 0800  piperacillin-tazobactam (ZOSYN) IVPB 4.5 g  Status:  Discontinued     4.5 g 200 mL/hr over 30 Minutes Intravenous  Once 01/03/13 0751 01/03/13 0754   01/03/13 0800  piperacillin-tazobactam (ZOSYN) IVPB 3.375 g     3.375 g 100 mL/hr over 30 Minutes Intravenous To Emergency Dept 01/03/13 0755 01/03/13 0852   01/03/13 0800  vancomycin (VANCOCIN) 1,250 mg in sodium chloride 0.9 % 250 mL IVPB     1,250 mg 166.7 mL/hr over 90 Minutes Intravenous STAT 01/03/13 0758 01/03/13 1109      Current Facility-Administered Medications  Medication Dose Route Frequency Provider Last Rate Last Dose  . 0.9 %  sodium chloride infusion  250 mL Intravenous PRN Brandi L Ollis, NP      . 0.9 %  sodium chloride infusion   Intravenous Continuous Brandi L Ollis, NP 125 mL/hr at 01/03/13 1111    . [START ON 01/04/2013] amantadine (SYMMETREL) capsule 100 mg  100 mg Oral QODAY Brandi L Ollis, NP       . dantrolene (DANTRIUM) capsule 25 mg  25 mg Oral TID Brandi L Ollis, NP      . feeding supplement (ENSURE COMPLETE) liquid 237 mL  237 mL Oral TID BM Brandi L Ollis, NP      . gabapentin (NEURONTIN) capsule 600 mg  600 mg Oral QID Robert S Byrum, MD      . heparin injection 5,000 Units  5,000 Units Subcutaneous Q8H Brandi L Ollis, NP      . multivitamin with minerals tablet 1 tablet  1 tablet Oral Daily Brandi L Ollis, NP      . piperacillin-tazobactam (ZOSYN) IVPB 3.375 g  3.375 g Intravenous Q8H Christine E Shade, RPH      . [START ON 01/04/2013] vancomycin (VANCOCIN) IVPB 750 mg/150 ml premix  750 mg Intravenous Q24H Christine E Shade, RPH       I have reviewed the CT films and US report and I have reviewed his recent labs and ER notes.   Procedure.  The penis was prepped with betadine and a 20fr coude foley catheter was placed to bedside bag drainage with return of several hundred cc of progressively more turbid, malodorous urine.  Assessment: Urosepsis with a neurogenic bladder with retention. Right ureteral stone with obstruction but the ureter is dilated below the stone and on the left as well so the retention is probably responsible for the hydro. Right renal stones.  Severely contracted lower extremities.   Plan: Leave foley to SD and consider suprapubic tube placement to aid management but the extremity contractures might complicate SP tube placement and will have to be more closely evaluated.   Right perc tube placement.  He will eventually need a right percutaneous nephrolithotomy for removal of the renal and ureteral stones.   CC: Dr. Robert Byrum.     LOS: 0 days    Duvid Smalls J 01/03/2013  

## 2013-01-25 NOTE — Brief Op Note (Signed)
01/25/2013  3:33 PM  PATIENT:  [name] Mccoy  52 y.o. male  PRE-OPERATIVE DIAGNOSIS:  right renal and ureteral stones combined length 3cm  POST-OPERATIVE DIAGNOSIS:  right renal and ureteral stones as noted above.   PROCEDURE:  Procedure(s): RIGHT PERCUTANEOUS NEPHROLITHOTOMY  (Right)  SURGEON:  Surgeon(s) and Role:    * Anner Crete, MD - Primary  PHYSICIAN ASSISTANT:   ASSISTANTS: none   ANESTHESIA:   general  EBL:  Total I/O In: 1200 [I.V.:1200] Out: 380 [Urine:375; Blood:5]  BLOOD ADMINISTERED:none  DRAINS: 74fr ainsworth right NT and 75fr safety cath. foley   LOCAL MEDICATIONS USED:  NONE  SPECIMEN:  Source of Specimen:  stones from right kidney and ureter  DISPOSITION OF SPECIMEN:  will show to patient and family and send for analysis through my office.   COUNTS:  YES  TOURNIQUET:  * No tourniquets in log *  DICTATION: .Other Dictation: Dictation Number [id number]  PLAN OF CARE: Admit for overnight observation  PATIENT DISPOSITION:  PACU - guarded condition.   Delay start of Pharmacological VTE agent (>24hrs) due to surgical blood loss or risk of bleeding: yes

## 2013-01-25 NOTE — Anesthesia Postprocedure Evaluation (Signed)
Anesthesia Post Note  Patient: [name] Mccoy. [name] Mccoy  Procedure(s) Performed: Procedure(s) (LRB): RIGHT PERCUTANEOUS NEPHROLITHOTOMY  (Right)  Anesthesia type: General  Patient location: PACU  Post pain: Pain level controlled  Post assessment: Post-op Vital signs reviewed  Last Vitals:  Filed Vitals:   01/25/13 1615  BP: 102/70  Pulse: 94  Temp:   Resp: 12    Post vital signs: Reviewed  Level of consciousness: sedated  Complications: No apparent anesthesia complications

## 2013-01-25 NOTE — Transfer of Care (Signed)
Immediate Anesthesia Transfer of Care Note  Patient: [name] Mccoy  Procedure(s) Performed: Procedure(s): RIGHT PERCUTANEOUS NEPHROLITHOTOMY  (Right)  Patient Location: PACU  Anesthesia Type:General  Level of Consciousness: awake, alert  and patient cooperative  Airway & Oxygen Therapy: Patient Spontanous Breathing, Patient connected to nasal cannula oxygen and Patient connected to face mask oxygen  Post-op Assessment: Report given to PACU RN and Post -op Vital signs reviewed and stable  Post vital signs: stable  Complications: No apparent anesthesia complications

## 2013-01-25 NOTE — Anesthesia Procedure Notes (Signed)
Procedure Name: Intubation Date/Time: 01/25/2013 1:27 PM Performed by: Leroy Libman L Patient Re-evaluated:Patient Re-evaluated prior to inductionOxygen Delivery Method: Circle system utilized Preoxygenation: Pre-oxygenation with 100% oxygen Intubation Type: IV induction Ventilation: Mask ventilation without difficulty and Oral airway inserted - appropriate to patient size Laryngoscope Size: Miller and 3 Grade View: Grade I Tube type: Reinforced Tube size: 8.0 mm Number of attempts: 1 Airway Equipment and Method: Stylet Placement Confirmation: ETT inserted through vocal cords under direct vision,  breath sounds checked- equal and bilateral and positive ETCO2 Secured at: 21 cm Tube secured with: Trach tape. Dental Injury: Teeth and Oropharynx as per pre-operative assessment

## 2013-01-26 ENCOUNTER — Inpatient Hospital Stay (HOSPITAL_COMMUNITY): Payer: BC Managed Care – PPO

## 2013-01-26 ENCOUNTER — Encounter (HOSPITAL_COMMUNITY): Payer: Self-pay | Admitting: Urology

## 2013-01-26 DIAGNOSIS — R627 Adult failure to thrive: Secondary | ICD-10-CM

## 2013-01-26 DIAGNOSIS — Z515 Encounter for palliative care: Secondary | ICD-10-CM

## 2013-01-26 LAB — CULTURE, BLOOD (ROUTINE X 2)
Culture: NO GROWTH
Culture: NO GROWTH

## 2013-01-26 LAB — HEMOGLOBIN AND HEMATOCRIT, BLOOD: HCT: 28.3 % — ABNORMAL LOW (ref 39.0–52.0)

## 2013-01-26 MED ORDER — SODIUM CHLORIDE 0.9 % IV SOLN
INTRAVENOUS | Status: DC
  Administered 2013-01-26: 09:00:00 via INTRAVENOUS
  Administered 2013-01-28: 1000 mL via INTRAVENOUS

## 2013-01-26 NOTE — Consult Note (Signed)
Patient [name] Mccoy      DOB: Feb 23, 1961      JYN:[id number]     Consult Note from the Palliative Medicine Team at [facility]    Consult Requested by: Dr Annabell Howells     PCP: Londell Moh, MD Reason for Consultation: Clarification of GOC and options    Phone Number:[phone]  Assessment of patients Current state: [name] Mccoy. [name] Mccoy is an 52 y.o. male with PMH of MS, pressure ulcers requiring debridement, frequent admits within previous 3 months for UTI's who presented to [facility] ER with caregiver reporting that he has became more lethargic and not himself, and decrease oral intake. He recently had UTI  During the last hospitalization, he also was found to have hip fracture that is new, and it was felt that he is not a surgical candidate, so his fractures were treated medically. He has a large right hip and sacral decubitus ulcer that are cared for by Dr. Norma Fredrickson and his home caregiver. He recently had a right kidney stone with hydronephrosis, and IR had placed a ureteral stent.  Admitted on 01-25-13 for surgery, with the diagnosis of right renal and ureteral stones The various methods of treatment have been discussed with the patient and family. After consideration of risks, benefits and other options for treatment, the patient has consented to Procedure(s):  RIGHT PERCUTANEOUS NEPHROLITHOTOMY (Right) as a surgical intervention  Overall failure to thrive, bed bound, BLE contractures, albumin 2.5, poor po intake, weight loss, frequent rehospitalization, pressure ulcers. Care for in home by patchwork of HPOAs and friends and neighbors    This NP Lorinda Creed reviewed medical records, received report from team, assessed the patient and then meet at the patient's bedside and by telephone with HPOA/ Alphonzo Cruise  to discuss diagnosis prognosis, GOC, EOL wishes disposition and options.  Specific attention to disposition in that all his caregivers continue to be concerned regarding the less than  optimal home situation.   A detailed discussion was had today regarding advanced directives.  Concepts specific to code status, artifical feeding and hydration, continued IV antibiotics and rehospitalization was had.  The difference between a aggressive medical intervention path  and a palliative comfort care path for this patient at this time was had.  Values and goals of care important to patient and family were attempted to be elicited.  Concept of Hospice and Palliative Care were discussed  Natural trajectory and expectations at EOL were discussed.  Questions and concerns addressed.  Hard Choices booklet left for review. Family encouraged to call with questions or concerns.  PMT will continue to support holistically.        Goals of Care:   1.  Code Status: DNR/DNI- this was discussed on last admission and clearly documented   2. Scope of Treatment: 1. Continue presetn treatment plan to treat the treatable.   4. Disposition: Continued discussion with the patient and with the HPOA via  telephone regarding the less than optima home situation.  Patient has clear capacity  to make medical decsions for himself and he wishes to return home with current caregiver plan when medically stable.   3. Symptom Management: Adult failure to thrive: Patient and caregivers strongly encouraged to consider detailed advanced directives in light of his diagnosis and long term prognosis. Recommend SNF placement for 24/7 nursing care and safety.     4. Psychosocial:  Emotional support offered to patient, allowed space for his to verbalize his feelings and concerns regarding his social and medical  situations.       Brief [name] Mccoy is an 52 y.o. male with PMH of MS, pressure ulcers requiring debridement, frequent admits within previous 3 months for UTI's who presented to [facility] ER with caregiver reporting that he has became more lethargic and not himself, and decrease oral intake. He  recently had UTI  During the last hospitalization, he also was found to have hip fracture that is new, and it was felt that he is not a surgical candidate, so his fractures were treated medically. He has a large right hip and sacral decubitus ulcer that are cared for by Dr. Norma Fredrickson and his home caregiver. He recently had a right kidney stone with hydronephrosis, and IR had placed a ureteral stent.  Admitted on 01-25-13 for surgery, with the diagnosis of right renal and ureteral stones The various methods of treatment have been discussed with the patient and family. After consideration of risks, benefits and other options for treatment, the patient has consented to Procedure(s):  RIGHT PERCUTANEOUS NEPHROLITHOTOMY (Right) as a surgical intervention  Overall failure to thrive, bed bound, BLE contractures, albumin 2.5, poor po intake, weight loss, frequent rehospitalization, pressure ulcers. Care for in home by patchwork of HPOAs and friends and neighbors      ROS: weakness, fatigue    PMH:  Past Medical History  Diagnosis Date  . Multiple sclerosis   . Pressure ulcer, buttock(707.05)   . Pain in joint, pelvic region and thigh   . Spasm of muscle   . Unspecified vitamin D deficiency   . Disturbance of skin sensation   . Right ureteral stone 01/03/13  . Renal stones   . Neurogenic bladder     01/13/13 FOLEY CATH  . Urinary retention with incomplete bladder emptying 01/13/13    FOLEY CATHETER  . Neuromuscular disorder     multiple schlorosis since 1983     PSH: Past Surgical History  Procedure Laterality Date  . Wound debridement Right 10/15/2012    Procedure: DEBRIDMENT OF RIGHT HIP DECUBITUS AND SACRAL DECUBITUS;  Surgeon: Velora Heckler, MD;  Location: [facility] ORS;  Service: General;  Laterality: Right;  left lateral position, right hip  . Nephrolithotomy Right 01/25/2013    Procedure: RIGHT PERCUTANEOUS NEPHROLITHOTOMY ;  Surgeon: Anner Crete, MD;  Location: [facility] ORS;  Service: Urology;   Laterality: Right;   I have reviewed the FH and SH and  If appropriate update it with new information. No Known Allergies Scheduled Meds: . amantadine  100 mg Oral BID  . ciprofloxacin  200 mg Intravenous Q12H  . collagenase   Topical Daily  . dantrolene  25 mg Oral TID  . docusate sodium  100 mg Oral BID  . ezetimibe  10 mg Oral QHS  . feeding supplement  237 mL Oral TID BM  . gabapentin  600 mg Oral QID  . Interferon Beta-1b  0.25 mg Subcutaneous QODAY  . modafinil  200 mg Oral BID  . multivitamin with minerals  1 tablet Oral Daily  . nutrition supplement  1 packet Oral BID PC  . tiZANidine  4 mg Oral QID   Continuous Infusions: . sodium chloride 20 mL/hr at 01/26/13 0928   PRN Meds:.acetaminophen, bisacodyl, HYDROcodone-acetaminophen, HYDROmorphone (DILAUDID) injection, ondansetron, zolpidem    BP 97/58  Pulse 122  Temp(Src) 98.1 F (36.7 C) (Oral)  Resp 18  Ht 6\' 3"  (1.905 m)  Wt 62.9 kg (138 lb 10.7 oz)  BMI 17.33 kg/m2  SpO2 100%  PPS:30 % at best   Intake/Output Summary (Last 24 hours) at 01/26/13 1641 Last data filed at 01/26/13 1600  Gross per 24 hour  Intake   2140 ml  Output   1250 ml  Net    890 ml    Physical Exam: General: chronically ill appearing, cachetic, NAD  HEENT: Mm no exudate  Chest: diminished in bases CTA  CVS: RRR  Abdomen: flat, NT +BS  Ext: BLE contractures, + muscle wasting  Neuro: alert and oriented X3, fully engaged in conversation,    Labs: CBC    Component Value Date/Time   WBC 9.9 01/25/2013 1000   RBC 4.30 01/25/2013 1000   HGB 8.8* 01/26/2013 0336   HCT 28.3* 01/26/2013 0336   PLT 724* 01/25/2013 1000   MCV 77.2* 01/25/2013 1000   MCH 24.0* 01/25/2013 1000   MCHC 31.0 01/25/2013 1000   RDW 17.6* 01/25/2013 1000   LYMPHSABS 1.8 01/20/2013 0225   MONOABS 1.0 01/20/2013 0225   EOSABS 0.3 01/20/2013 0225   BASOSABS 0.1 01/20/2013 0225    BMET    Component Value Date/Time   NA 132* 01/25/2013 1635   K 3.9 01/25/2013 1635   CL 98  01/25/2013 1635   CO2 28 01/25/2013 1635   GLUCOSE 124* 01/25/2013 1635   BUN 15 01/25/2013 1635   CREATININE 0.68 01/25/2013 1635   CALCIUM 9.1 01/25/2013 1635   GFRNONAA >90 01/25/2013 1635   GFRAA >90 01/25/2013 1635    CMP     Component Value Date/Time   NA 132* 01/25/2013 1635   K 3.9 01/25/2013 1635   CL 98 01/25/2013 1635   CO2 28 01/25/2013 1635   GLUCOSE 124* 01/25/2013 1635   BUN 15 01/25/2013 1635   CREATININE 0.68 01/25/2013 1635   CALCIUM 9.1 01/25/2013 1635   PROT 9.2* 01/20/2013 0118   ALBUMIN 2.5* 01/20/2013 0118   AST 19 01/20/2013 0118   ALT 8 01/20/2013 0118   ALKPHOS 98 01/20/2013 0118   BILITOT 0.5 01/20/2013 0118   GFRNONAA >90 01/25/2013 1635   GFRAA >90 01/25/2013 1635      Time In Time Out Total Time Spent with Patient Total Overall Time  1700 1750 40 min  50 min    Greater than 50%  of this time was spent counseling and coordinating care related to the above assessment and plan.  Lorinda Creed NP  Palliative Medicine Team Team Phone # [phone] Pager [phone]  Discussed with [facility] Case Manager

## 2013-01-26 NOTE — Progress Notes (Signed)
Patient ID: [name] Mccoy. Jumper, male   DOB: 1960-06-30, 52 y.o.   MRN: [id number] 1 Day Post-Op  Subjective: [name] Mccoy is doing well this morning with only mild pain.  He is normotensive without fever but has some tachycardia.   His NT and foley are draining well with pink urine.   He has mild hyponatremia and his Hgb is down from 10.3 to 8.8 postop.    ROS: Negative except as above  Objective: Vital signs in last 24 hours: Temp:  [97 F (36.1 C)-98.9 F (37.2 C)] 98.9 F (37.2 C) (09/10 0000) Pulse Rate:  [92-143] 113 (09/10 0700) Resp:  [12-20] 16 (09/10 0700) BP: (94-117)/(62-86) 101/69 mmHg (09/10 0700) SpO2:  [98 %-100 %] 100 % (09/10 0700) Weight:  [55.877 kg (123 lb 3 oz)-62.9 kg (138 lb 10.7 oz)] 62.9 kg (138 lb 10.7 oz) (09/09 1718)  Intake/Output from previous day: 09/09 0701 - 09/10 0700 In: 3150 [P.O.:200; I.V.:2850; IV Piggyback:100] Out: 1640 [Urine:1625; Blood:15] Intake/Output this shift:    General appearance: alert and no distress Resp: clear to auscultation bilaterally Cardio: tachycardia with sinus rhythm. GI: soft and flat with mild RUQ tenderness  Lab Results:   Recent Labs  01/25/13 1000 01/25/13 1635 01/26/13 0336  WBC 9.9  --   --   HGB 10.3* 9.7* 8.8*  HCT 33.2* 30.1* 28.3*  PLT 724*  --   --    BMET  Recent Labs  01/25/13 1000 01/25/13 1635  NA 135 132*  K 3.9 3.9  CL 99 98  CO2 29 28  GLUCOSE 109* 124*  BUN 14 15  CREATININE 0.68 0.68  CALCIUM 9.3 9.1   PT/INR No results found for this basename: LABPROT, INR,  in the last 72 hours ABG No results found for this basename: PHART, PCO2, PO2, HCO3,  in the last 72 hours  Studies/Results: Dg C-arm 61-120 Min-no Report  01/25/2013   CLINICAL DATA: ureteral stone   C-ARM 61-120 MINUTES  Fluoroscopy was utilized by the requesting physician.  No radiographic  interpretation.     Anti-infectives: Anti-infectives   Start     Dose/Rate Route Frequency Ordered Stop   01/26/13 0200   ciprofloxacin (CIPRO) IVPB 200 mg     200 mg 100 mL/hr over 60 Minutes Intravenous Every 12 hours 01/25/13 1724     01/25/13 0922  ciprofloxacin (CIPRO) IVPB 400 mg     400 mg 200 mL/hr over 60 Minutes Intravenous 60 min pre-op 01/25/13 4098 01/25/13 1345      Current Facility-Administered Medications  Medication Dose Route Frequency Provider Last Rate Last Dose  . 0.9 %  sodium chloride infusion   Intravenous Continuous Anner Crete, MD      . acetaminophen (TYLENOL) tablet 650 mg  650 mg Oral Q4H PRN Anner Crete, MD      . amantadine (SYMMETREL) capsule 100 mg  100 mg Oral BID Anner Crete, MD   100 mg at 01/25/13 2104  . bisacodyl (DULCOLAX) suppository 10 mg  10 mg Rectal Daily PRN Anner Crete, MD      . ciprofloxacin (CIPRO) IVPB 200 mg  200 mg Intravenous Q12H Anner Crete, MD   200 mg at 01/26/13 0300  . collagenase (SANTYL) ointment   Topical Daily Anner Crete, MD      . dantrolene (DANTRIUM) capsule 25 mg  25 mg Oral TID Anner Crete, MD   25 mg at 01/25/13 2104  . dextrose 5 %  and 0.45 % NaCl with KCl 20 mEq/L infusion   Intravenous Continuous Anner Crete, MD 100 mL/hr at 01/26/13 0354    . docusate sodium (COLACE) capsule 100 mg  100 mg Oral BID Anner Crete, MD   100 mg at 01/25/13 2104  . ezetimibe (ZETIA) tablet 10 mg  10 mg Oral QHS Anner Crete, MD   10 mg at 01/25/13 2104  . feeding supplement (ENSURE COMPLETE) liquid 237 mL  237 mL Oral TID BM Anner Crete, MD   237 mL at 01/25/13 2200  . gabapentin (NEURONTIN) capsule 600 mg  600 mg Oral QID Anner Crete, MD   600 mg at 01/25/13 2104  . HYDROcodone-acetaminophen (NORCO/VICODIN) 5-325 MG per tablet 1-2 tablet  1-2 tablet Oral Q4H PRN Anner Crete, MD   2 tablet at 01/26/13 0731  . HYDROmorphone (DILAUDID) injection 0.5-1 mg  0.5-1 mg Intravenous Q2H PRN Anner Crete, MD      . Interferon Beta-1b (BETASERON/EXTAVIA) injection 0.25 mg  0.25 mg Subcutaneous QODAY Gwen Her, RPH   0.25 mg at 01/25/13 1815  .  modafinil (PROVIGIL) tablet 200 mg  200 mg Oral BID Anner Crete, MD   200 mg at 01/25/13 2249  . multivitamin with minerals tablet 1 tablet  1 tablet Oral Daily Anner Crete, MD   1 tablet at 01/25/13 2104  . nutrition supplement (JUVEN) powder packet 1 packet  1 packet Oral BID PC Anner Crete, MD      . ondansetron [facility]) injection 4 mg  4 mg Intravenous Q4H PRN Anner Crete, MD      . tiZANidine (ZANAFLEX) tablet 4 mg  4 mg Oral QID Anner Crete, MD   4 mg at 01/25/13 2104  . zolpidem (AMBIEN) tablet 5 mg  5 mg Oral QHS PRN Anner Crete, MD       I have reviewed his labs.   Assessment: s/p Procedure(s): RIGHT PERCUTANEOUS NEPHROLITHOTOMY   He has a post op acute blood loss anemia and mild hyponatremia but is otherwise doing well.  Plan: CT for assessment of residual stones is pending. I will change his IV to NS at [facility]. Repeat H&H and BMP in am.     LOS: 1 day    [name] Mccoy J 01/26/2013

## 2013-01-26 NOTE — Progress Notes (Signed)
[facility]  Patient Status: Active (receiving services up to time of hospitalization)  [facility] is providing the following services: RN, PT, OT, MSW and HHA  If patient discharges after hours, please call [phone].   [name] Mccoy 01/26/2013, 5:30 PM

## 2013-01-26 NOTE — Op Note (Signed)
NAME:  [name] Mccoy, [name] Mccoy NO.:  [id number]  MEDICAL RECORD NO.:  [id number]  LOCATION:  1228                         FACILITY:  [facility]  PHYSICIAN:  Excell Seltzer. Annabell Howells, M.D.    DATE OF BIRTH:  [birth year]  DATE OF PROCEDURE:  01/25/2013 DATE OF DISCHARGE:                              OPERATIVE REPORT   PROCEDURE:  Right percutaneous nephrolithotomy with removal of an 18 mm right upper pole stone and 13 mm right ureteral stone length greater than 3 cm.  PREOPERATIVE DIAGNOSIS:  Right renal and ureteral stones with recent obstruction sepsis.  POSTOPERATIVE DIAGNOSIS:  Right renal and ureteral stones with recent obstruction sepsis.  SURGEON:  Excell Seltzer. Annabell Howells, M.D.  ANESTHESIA:  General.  SPECIMEN:  Stone fragments.  DRAINS:  A 24-French Ainsworth right nephrostomy tube, 6-French Kumpe safety catheter, and Foley catheter.  BLOOD LOSS:  Minimal.  COMPLICATIONS:  None.  INDICATIONS:  Mr. Vossler is a 52 year old, African American male with multiple sclerosis and neurogenic bladder.  He had been managed with spontaneous voiding but was recently admitted to the hospital and was found to be in retention and had right ureteral obstruction from a 13 mm proximal stone.  He also had an 18-mm right upper pole stone as well as several small right lower pole stones.  He underwent placement of a Foley catheter and percutaneous nephrostomy tube at that admission and returns now for removal of the stones.  It was felt that an antegrade approach was most appropriate for his ureteral stone because of his severe lower extremity contractions.  FINDINGS, PROCEDURE:  He was taken to the operating room where general anesthetic was induced on the holding room table.  He was given Cipro. He was then turned prone on chest rolls with great care being taken to pad all pressure points since he had severe lower extremity contractions.  Once in position, a Foley catheter was positioned  for anesthesia observation.  His nephrostomy site was then prepped with ChloraPrep and draped in the usual sterile fashion.  The nephrostomy tube was then wired out over superstiff wire, and initially a 25-cm 12/14 digital access sheath was placed over the wire into the renal pelvis.  The wire and inner core were then removed.  A digital ureteroscope was then passed through the access sheath, down the proximal ureter and the proximal ureteral stone was visualized.  It was engaged with a 200 micron laser fiber at 0.5 joules and 10 hertz, and broken into small fragments.  The larger fragments were then removed using a Nitinol basket.  I was unable to remove all the fragments because they were small and also quite soft being most likely struvite stones.  Once the ureteral stone had been managed, we began inspecting the kidney and did remove some lower pole stones and visualize the upper pole stones but did not remove them with digital scope.  At this point, a guidewire was inserted through the ureteroscope and advanced down the ureter and the digital access sheath was removed.  A peel-away sheath was then placed over the wire and second wire using a Sensor wire was then passed through the peel-away sheath into the proximal ureter  to provide safety access.  A Kumpe catheter was used through the peel-away sheath in 88 positioning.  Once both wires were in position, the peel-away sheath was removed and a skin incision was created approximately 1.5 cm in length.  A Trackmaster balloon was then placed over the working wire, and was inflated 18 atmospheres until the waist disappeared.  The access sheath was then advanced over the balloon and the balloon was removed.  Initially, the rigid nephroscope was then introduced.  I was able to remove some small fragments from the lower pole, however, was not able to readily access the upper pole to engage the stone that was identified.  I then  passed the flexible nephroscope and was able to remove the stones from the upper pole calices.  The larger stone was removed largely intact.  At this point, final inspection revealed some dust and lower pole calices in the proximal ureter but it was too small to retrieve.  I then reinserted the digital ureteroscope into the proximal ureter to ensure no significant fragments remained in the proximal ureter and none were seen.  However, there was some dust was noted.  At this point, a Kumpe catheter was placed over the Sensor wire and was advanced to distal ureter.  The wire was removed and contrast was introduced which showed reflux of contrast into the bladder.  No significant filling defects were identified.  At this point, a 24-French Ainsworth catheter was passed through the access sheath over the working wire, and negotiated into the lower pole. Initially, it was not quite far enough and so was advanced to the renal pelvis and then adjusted until the contrast demonstrated an intrarenal position.  The balloon on the Ainsworth was filled with 3 mL of sterile water.  The access sheath was then backed off.  The nephrostomy tube and safety catheter were then secured to the skin with a 2-0 silk suture, and the access sheath was then cut away from the Foley catheter.  The Foley catheter had some mild venous bleeding, so it was clamped initially until the drapes could be removed and drainage bag placed.  Once the patient's nephrostomy site had been addressed, the safety catheter capped and the nephrostomy tube placed to straight drainage. The patient was rolled back onto the recovery room bed and his anesthetic was reversed.  He was moved to recovery in stable condition. There were no complications.     Excell Seltzer. Annabell Howells, M.D.     JJW/MEDQ  D:  01/25/2013  T:  01/26/2013  Job:  [id number]

## 2013-01-26 NOTE — Progress Notes (Signed)
CARE MANAGEMENT NOTE 01/26/2013  Patient:  TIRTH, COTHRON K.   Account Number:  [id number]  Date Initiated:  01/26/2013  Documentation initiated by:  France Lusty  Subjective/Objective Assessment:   pt with urinary sepsis and obstruction to or for stent and ir for drain placement.     Action/Plan:   home when stable   Anticipated DC Date:  01/29/2013   Anticipated DC Plan:  HOME W HOME HEALTH SERVICES  In-house referral  NA      DC Planning Services  CM consult      [facility] Choice  Resumption Of Svcs/PTA Provider   Choice offered to / List presented to:             Status of service:  In process, will continue to follow Medicare Important Message given?   (If response is "NO", the following Medicare IM given date fields will be blank) Date Medicare IM given:   Date Additional Medicare IM given:    Discharge Disposition:    Per UR Regulation:    If discussed at Long Length of Stay Meetings, dates discussed:    Comments:  [id number]/Kerstyn Coryell Stark Jock, BSN, CCM [phone] Chart Reviewed for discharge and hospital needs. Discharge needs at time of review:  None Review of patient progress due on [id number].

## 2013-01-27 ENCOUNTER — Encounter (HOSPITAL_COMMUNITY): Payer: Self-pay | Admitting: Urology

## 2013-01-27 DIAGNOSIS — E871 Hypo-osmolality and hyponatremia: Secondary | ICD-10-CM

## 2013-01-27 DIAGNOSIS — D509 Iron deficiency anemia, unspecified: Secondary | ICD-10-CM

## 2013-01-27 HISTORY — DX: Hypo-osmolality and hyponatremia: E87.1

## 2013-01-27 LAB — BASIC METABOLIC PANEL
BUN: 15 mg/dL (ref 6–23)
CO2: 26 mEq/L (ref 19–32)
Calcium: 8.5 mg/dL (ref 8.4–10.5)
Calcium: 8.6 mg/dL (ref 8.4–10.5)
Chloride: 92 mEq/L — ABNORMAL LOW (ref 96–112)
Creatinine, Ser: 0.63 mg/dL (ref 0.50–1.35)
GFR calc Af Amer: >90 mL/min (ref >90)
GFR calc Af Amer: >90 mL/min (ref >90)
GFR calc non Af Amer: >90 mL/min (ref >90)
Glucose, Bld: 135 mg/dL — ABNORMAL HIGH (ref 70–99)
Potassium: 4.1 mEq/L (ref 3.5–5.1)
Sodium: 126 mEq/L — ABNORMAL LOW (ref 135–145)

## 2013-01-27 LAB — SODIUM, URINE, RANDOM: Sodium, Ur: 124 mEq/L

## 2013-01-27 LAB — OSMOLALITY, URINE: Osmolality, Ur: 428 mOsm/kg (ref 390–1090)

## 2013-01-27 LAB — HEMOGLOBIN AND HEMATOCRIT, BLOOD
HCT: 26.2 % — ABNORMAL LOW (ref 39.0–52.0)
Hemoglobin: 8.1 g/dL — ABNORMAL LOW (ref 13.0–17.0)

## 2013-01-27 MED ORDER — MODAFINIL 200 MG PO TABS
200.0000 mg | ORAL_TABLET | ORAL | Status: DC
  Administered 2013-01-28 – 2013-01-30 (×6): 200 mg via ORAL
  Filled 2013-01-27 (×6): qty 1

## 2013-01-27 MED ORDER — METOPROLOL SUCCINATE ER 25 MG PO TB24
25.0000 mg | ORAL_TABLET | Freq: Every day | ORAL | Status: DC
  Administered 2013-01-27 – 2013-01-30 (×4): 25 mg via ORAL
  Filled 2013-01-27 (×5): qty 1

## 2013-01-27 NOTE — Progress Notes (Signed)
Inadvertent note - please disregard.

## 2013-01-27 NOTE — Progress Notes (Signed)
Patient ID: [name] Mccoy. Winograd, male   DOB: 02/11/1961, 52 y.o.   MRN: [id number] 2 Days Post-Op  Subjective: [name] is not having too much pain.   His nephrostomy tube is only draining small amounts of bloody urine.  He is tachycardic but reports that he has had some issues with that for the past year.   He has progressive hyponatremia despite his IV being NS at TKO rate and his Hgb has decline from 8.8 to 8.1 without obvious evidence of bleeding.   The CT yesterday only showed a very small fragment or cluster of fragments in the right kidney and no perinephric bleeding.   ROS: Negative except as above  Objective: Vital signs in last 24 hours: Temp:  [98.1 F (36.7 C)-100.2 F (37.9 C)] 99 F (37.2 C) (09/11 0444) Pulse Rate:  [109-124] 112 (09/11 1037) Resp:  [13-19] 14 (09/11 1037) BP: (88-102)/(56-64) 98/62 mmHg (09/11 0800) SpO2:  [98 %-100 %] 98 % (09/11 1037)  Intake/Output from previous day: 09/10 0701 - 09/11 0700 In: 1200 [P.O.:480; I.V.:520; IV Piggyback:200] Out: 1830 [Urine:1830] Intake/Output this shift: Total I/O In: 460 [P.O.:400; I.V.:60] Out: 350 [Urine:350]  General appearance: alert and no distress His NT site is without erythema, urine leakage or significant bleeding.    Lab Results:   Recent Labs  01/25/13 1000  01/26/13 0336 01/27/13 0330  WBC 9.9  --   --   --   HGB 10.3*  < > 8.8* 8.1*  HCT 33.2*  < > 28.3* 26.2*  PLT 724*  --   --   --   < > = values in this interval not displayed. BMET  Recent Labs  01/25/13 1635 01/27/13 0330  NA 132* 126*  K 3.9 4.9  CL 98 92*  CO2 28 26  GLUCOSE 124* 109*  BUN 15 18  CREATININE 0.68 0.63  CALCIUM 9.1 8.5   PT/INR No results found for this basename: LABPROT, INR,  in the last 72 hours ABG No results found for this basename: PHART, PCO2, PO2, HCO3,  in the last 72 hours  Studies/Results: Ct Abdomen Pelvis Wo Contrast  01/26/2013   *RADIOLOGY REPORT*  Clinical Data: Status post right percutaneous  nephrostomy, evaluate for residual renal/ureteral stones  CT ABDOMEN AND PELVIS WITHOUT CONTRAST  Technique:  Multidetector CT imaging of the abdomen and pelvis was performed following the standard protocol without intravenous contrast.  Comparison: 01/03/2013  Findings: Emphysematous changes at the lung bases.  Unenhanced liver, spleen, pancreas, and adrenal glands are within normal limits.  Multiple layering gallstones, without gallbladder distension or associated inflammatory changes.  No intrahepatic or extrahepatic ductal dilatation.  Large bore right nephrostomy catheter.  Additional small bore nephroureterostomy catheter terminates in the distal ureter.  Punctate nonobstructing right upper pole renal calculus (series 2/image 26).  3 mm nonobstructing interpolar right renal calculus (series 2/image 31).  Gas within the proximal collecting system/ureter related to catheter placement.  No ureteral calculi are seen.  Multiple small nonobstructing left renal calculi measuring up to 3 mm in the lower pole (series 2/image 34).  No hydronephrosis.  No evidence of bowel obstruction.  Moderate colonic stool burden.  Atherosclerotic calcifications of the abdominal aorta and branch vessels.  No abdominopelvic ascites.  No suspicious abdominopelvic lymphadenopathy.  Prostate is unremarkable.  Bladder is decompressed with indwelling Foley catheter and associated gas.  Scattered small bladder calculi in the left dependent bladder measuring up to 4 mm (series 2/image 77).  Stable dysmorphic pelvis with  right hip subluxation and minimally displaced left femoral neck fracture (series 2/image 86). Sclerosis/irregularity of the left iliac tuberosity (series 2/image 64), likely reflecting prior/chronic osteomyelitis related to a sacral decubitus ulcer.  Mild degenerative changes at L5-S1.  IMPRESSION: Status post right nephrostomy with small bore nephroureterostomy catheter terminating in the distal ureter.  Two nonobstructing  right renal calculi measuring up to 3 mm.  No right ureteral calculi.  Multiple nonobstructing left renal calculi measuring up to 3 mm. No hydronephrosis.  Layering bladder calculi measuring up to 4 mm.  Indwelling Foley catheter.   Original Report Authenticated By: Charline Bills, M.D.   Dg C-arm 61-120 Min-no Report  01/25/2013   CLINICAL DATA: ureteral stone   C-ARM 61-120 MINUTES  Fluoroscopy was utilized by the requesting physician.  No radiographic  interpretation.     Anti-infectives: Anti-infectives   Start     Dose/Rate Route Frequency Ordered Stop   01/26/13 0200  ciprofloxacin (CIPRO) IVPB 200 mg     200 mg 100 mL/hr over 60 Minutes Intravenous Every 12 hours 01/25/13 1724     01/25/13 0922  ciprofloxacin (CIPRO) IVPB 400 mg     400 mg 200 mL/hr over 60 Minutes Intravenous 60 min pre-op 01/25/13 1610 01/25/13 1345      Current Facility-Administered Medications  Medication Dose Route Frequency Provider Last Rate Last Dose  . 0.9 %  sodium chloride infusion   Intravenous Continuous Anner Crete, MD 20 mL/hr at 01/26/13 [phone]    . acetaminophen (TYLENOL) tablet 650 mg  650 mg Oral Q4H PRN Anner Crete, MD      . amantadine (SYMMETREL) capsule 100 mg  100 mg Oral BID Anner Crete, MD   100 mg at 01/27/13 5409  . bisacodyl (DULCOLAX) suppository 10 mg  10 mg Rectal Daily PRN Anner Crete, MD      . ciprofloxacin (CIPRO) IVPB 200 mg  200 mg Intravenous Q12H Anner Crete, MD   200 mg at 01/27/13 0205  . collagenase (SANTYL) ointment   Topical Daily Anner Crete, MD      . dantrolene (DANTRIUM) capsule 25 mg  25 mg Oral TID Anner Crete, MD   25 mg at 01/27/13 8119  . docusate sodium (COLACE) capsule 100 mg  100 mg Oral BID Anner Crete, MD   100 mg at 01/27/13 1478  . ezetimibe (ZETIA) tablet 10 mg  10 mg Oral QHS Anner Crete, MD   10 mg at 01/26/13 2153  . feeding supplement (ENSURE COMPLETE) liquid 237 mL  237 mL Oral TID BM Anner Crete, MD   237 mL at 01/27/13 0937  .  gabapentin (NEURONTIN) capsule 600 mg  600 mg Oral QID Anner Crete, MD   600 mg at 01/27/13 2956  . HYDROcodone-acetaminophen (NORCO/VICODIN) 5-325 MG per tablet 1-2 tablet  1-2 tablet Oral Q4H PRN Anner Crete, MD   2 tablet at 01/27/13 1036  . HYDROmorphone (DILAUDID) injection 0.5-1 mg  0.5-1 mg Intravenous Q2H PRN Anner Crete, MD      . Interferon Beta-1b (BETASERON/EXTAVIA) injection 0.25 mg  0.25 mg Subcutaneous QODAY Gwen Her, RPH   0.25 mg at 01/27/13 1040  . modafinil (PROVIGIL) tablet 200 mg  200 mg Oral BID Anner Crete, MD   200 mg at 01/26/13 2153  . multivitamin with minerals tablet 1 tablet  1 tablet Oral Daily Anner Crete, MD   1 tablet at 01/27/13 [phone]  .  nutrition supplement (JUVEN) powder packet 1 packet  1 packet Oral BID PC Anner Crete, MD   1 packet at 01/26/13 1808  . ondansetron (ZOFRAN) injection 4 mg  4 mg Intravenous Q4H PRN Anner Crete, MD      . tiZANidine (ZANAFLEX) tablet 4 mg  4 mg Oral QID Anner Crete, MD   4 mg at 01/27/13 1610  . zolpidem (AMBIEN) tablet 5 mg  5 mg Oral QHS PRN Anner Crete, MD       I removed the right NT and safety catheter without difficulty or bleeding and place a fresh dressing.   Assessment: s/p Procedure(s): RIGHT PERCUTANEOUS NEPHROLITHOTOMY  He has only minimal residual fragments that don't merit a second look.   He has a progressive anemia without obvious evidence of bleeding and he has worsening hyponatremia despite a reduction in IV fluids and conversion to NS.  He has intermittant sinus tachycardia which he states has been a problem for some time.  Plan: Nephrostomy catheters removed.  I have requested a hospitalist consultation for the hyponatremia and tachycardia. I will check labs in the AM.      LOS: 2 days    Tyreanna Bisesi J 01/27/2013

## 2013-01-27 NOTE — Consult Note (Addendum)
[facility] Consult Note History and Physical  [name] Mccoy. [name] Mccoy ZOX:[id number] DOB: 1960/11/08 DOA: 01/25/2013  Referring physician: Dr. Bjorn Pippin Reason for consultation: Hyponatremia/tachycardia PCP: Londell Moh, MD   Chief Complaint: Initially came in with high fever.   History of Present Illness: [name] Mccoy. Leaf is an 52 y.o. male with a PMH of MS, neurogenic bladder with chronic indwelling Foley catheter, pressure ulcers requiring debridement and recurrent hospitalizations to treat UTI, last admission 01/20/2013-01/22/2013--- discharged home on oral antibiotic therapy.  He followed up with his urologist on 01/25/2013 status post placement of a right percutaneous nephrostomy tube for definitive treatment of his nephrolithiasis. He underwent right percutaneous nephrolithotripsy and his post procedure course has been complicated by tachycardia, hyponatremia, and anemia. The patient tells me he is largely asymptomatic and that he had recent fevers, but these have resolved. His review of systems is negative and as detailed below.  Review of Systems: Constitutional: No fever, no chills;  Appetite normal; No weight loss, no weight gain, no fatigue.  HEENT: No blurry vision, no diplopia, no pharyngitis, no dysphagia CV: No chest pain, no palpitations.  Resp: No SOB, no cough. GI: No nausea, no vomiting, no diarrhea, no melena, no hematochezia.  GU: No dysuria, no hematuria.  MSK: no myalgias, no arthralgias.  Neuro:  No headache, no focal neurological deficits, no history of seizures.  Psych: No depression, no anxiety.  Endo: No thyroid disease, no DM, no heat intolerance, no cold intolerance, no polyuria, no polydipsia  Skin: No rashes, no skin lesions.  Heme: No easy bruising, no history of blood diseases.  Past Medical History Past Medical History  Diagnosis Date  . Multiple sclerosis   . Pressure ulcer, buttock(707.05)   . Pain in joint, pelvic region and thigh   . Spasm of muscle    . Unspecified vitamin D deficiency   . Disturbance of skin sensation   . Right ureteral stone 01/03/13  . Renal stones   . Neurogenic bladder     01/13/13 FOLEY CATH  . Urinary retention with incomplete bladder emptying 01/13/13    FOLEY CATHETER  . Neuromuscular disorder     multiple schlorosis since 1983  . Hyponatremia 01/27/2013     Past Surgical History Past Surgical History  Procedure Laterality Date  . Wound debridement Right 10/15/2012    Procedure: DEBRIDMENT OF RIGHT HIP DECUBITUS AND SACRAL DECUBITUS;  Surgeon: Velora Heckler, MD;  Location: [facility];  Service: General;  Laterality: Right;  left lateral position, right hip  . Nephrolithotomy Right 01/25/2013    Procedure: RIGHT PERCUTANEOUS NEPHROLITHOTOMY ;  Surgeon: Anner Crete, MD;  Location: [facility];  Service: Urology;  Laterality: Right;     Social History: History   Social History  . Marital Status: Single    Spouse Name: N/A    Number of Children: 0  . Years of Education: N/A   Occupational History  . Disabled    Social History Main Topics  . Smoking status: Never Smoker   . Smokeless tobacco: Never Used  . Alcohol Use: No  . Drug Use: No  . Sexual Activity: No   Other Topics Concern  . Not on file   Social History Narrative   Patient is single and lives at home alone. Patient is disabled.    Family History:  Family History  Problem Relation Age of Onset  . Hypertension Mother   . Dementia Mother   . Hypertension Father   . Heart disease Father  Allergies: Review of patient's allergies indicates no known allergies.  Meds: Prior to Admission medications   Medication Sig Start Date End Date Taking? Authorizing Provider  amantadine (SYMMETREL) 100 MG capsule Take 100 mg by mouth 2 (two) times daily.    Yes Historical Provider, MD  collagenase (SANTYL) ointment Apply topically daily. Apply to Unstageable pressure ulcer on coccyx once daily.  Apply 1/8 inch thick and top with saline dressing.   Change daily and PRN for soiling or loosening of dressing x 21 days. 09/21/12  Yes Elease Etienne, MD  dantrolene (DANTRIUM) 25 MG capsule Take 25 mg by mouth 3 (three) times daily.   Yes Historical Provider, MD  ezetimibe (ZETIA) 10 MG tablet Take 10 mg by mouth at bedtime.    Yes Historical Provider, MD  feeding supplement (ENSURE COMPLETE) LIQD Take 237 mLs by mouth 3 (three) times daily between meals. 09/21/12  Yes Elease Etienne, MD  gabapentin (NEURONTIN) 600 MG tablet Take 600 mg by mouth 4 (four) times daily.   Yes Historical Provider, MD  HYDROcodone-acetaminophen (NORCO/VICODIN) 5-325 MG per tablet Take 2 tablets by mouth. Take 1 tablet every 8 hours as needed for pain(1-5) Take 2 tablets every 8 hours as needed for pain (6-10). 10/25/12  Yes Tiffany L Reed, DO  Interferon Beta-1b (BETASERON) 0.3 MG KIT injection Inject 0.25 mg into the skin every other day. 12/31/12  Yes Suanne Marker, MD  modafinil (PROVIGIL) 200 MG tablet Take 200 mg by mouth 2 (two) times daily.    Yes Historical Provider, MD  Multiple Vitamin (MULTIVITAMIN WITH MINERALS) TABS Take 1 tablet by mouth daily. 09/21/12  Yes Elease Etienne, MD  nutrition supplement [employer]) PACK Take 1 packet by mouth 2 (two) times daily after a meal. 09/21/12  Yes Elease Etienne, MD  tiZANidine (ZANAFLEX) 4 MG tablet Take 4 mg by mouth 4 (four) times daily.    Yes Historical Provider, MD    Physical Exam: Filed Vitals:   01/27/13 0444 01/27/13 0800 01/27/13 1037 01/27/13 1200  BP:  98/62  93/63  Pulse:  118 112 117  Temp: 99 F (37.2 C)   98 F (36.7 C)  TempSrc: Oral   Oral  Resp:  16 14 16   Height:      Weight:      SpO2:  98% 98% 98%     Physical Exam: Blood pressure 93/63, pulse 117, temperature 98 F (36.7 C), temperature source Oral, resp. rate 16, height 6\' 3"  (1.905 m), weight 62.9 kg (138 lb 10.7 oz), SpO2 98.00%. Gen: No acute distress. Head: Normocephalic, atraumatic. Eyes: PERRL, EOMI, sclerae  nonicteric. Mouth: Oropharynx clear. Moist mucous membranes. Neck: Supple, no thyromegaly, no lymphadenopathy, no jugular venous distention. Chest: Lungs are clear to auscultation bilaterally. CV: Heart sounds are regular but tachycardic. No murmurs, rubs, or gallops. Abdomen: Soft, nontender, nondistended with normal active bowel sounds. Extremities: Lower extremities have contractures Skin: Warm and dry. Neuro: Alert and oriented times 3; cranial nerves II through XII grossly intact. Psych: Mood and affect normal.  Labs on Admission:  Basic Metabolic Panel:  Recent Labs Lab 01/25/13 1000 01/25/13 1635 01/27/13 0330 01/27/13 1000  NA 135 132* 126* 129*  K 3.9 3.9 4.9 4.1  CL 99 98 92* 93*  CO2 29 28 26 27   GLUCOSE 109* 124* 109* 135*  BUN 14 15 18 15   CREATININE 0.68 0.68 0.63 0.69  CALCIUM 9.3 9.1 8.5 8.6   Liver Function Tests: No results  found for this basename: AST, ALT, ALKPHOS, BILITOT, PROT, ALBUMIN,  in the last 168 hours No results found for this basename: LIPASE, AMYLASE,  in the last 168 hours No results found for this basename: AMMONIA,  in the last 168 hours CBC:  Recent Labs Lab 01/25/13 1000 01/25/13 1635 01/26/13 0336 01/27/13 0330  WBC 9.9  --   --   --   HGB 10.3* 9.7* 8.8* 8.1*  HCT 33.2* 30.1* 28.3* 26.2*  MCV 77.2*  --   --   --   PLT 724*  --   --   --    Radiological Exams on Admission: Ct Abdomen Pelvis Wo Contrast  01/26/2013   *RADIOLOGY REPORT*  Clinical Data: Status post right percutaneous nephrostomy, evaluate for residual renal/ureteral stones  CT ABDOMEN AND PELVIS WITHOUT CONTRAST  Technique:  Multidetector CT imaging of the abdomen and pelvis was performed following the standard protocol without intravenous contrast.  Comparison: 01/03/2013  Findings: Emphysematous changes at the lung bases.  Unenhanced liver, spleen, pancreas, and adrenal glands are within normal limits.  Multiple layering gallstones, without gallbladder distension  or associated inflammatory changes.  No intrahepatic or extrahepatic ductal dilatation.  Large bore right nephrostomy catheter.  Additional small bore nephroureterostomy catheter terminates in the distal ureter.  Punctate nonobstructing right upper pole renal calculus (series 2/image 26).  3 mm nonobstructing interpolar right renal calculus (series 2/image 31).  Gas within the proximal collecting system/ureter related to catheter placement.  No ureteral calculi are seen.  Multiple small nonobstructing left renal calculi measuring up to 3 mm in the lower pole (series 2/image 34).  No hydronephrosis.  No evidence of bowel obstruction.  Moderate colonic stool burden.  Atherosclerotic calcifications of the abdominal aorta and branch vessels.  No abdominopelvic ascites.  No suspicious abdominopelvic lymphadenopathy.  Prostate is unremarkable.  Bladder is decompressed with indwelling Foley catheter and associated gas.  Scattered small bladder calculi in the left dependent bladder measuring up to 4 mm (series 2/image 77).  Stable dysmorphic pelvis with right hip subluxation and minimally displaced left femoral neck fracture (series 2/image 86). Sclerosis/irregularity of the left iliac tuberosity (series 2/image 64), likely reflecting prior/chronic osteomyelitis related to a sacral decubitus ulcer.  Mild degenerative changes at L5-S1.  IMPRESSION: Status post right nephrostomy with small bore nephroureterostomy catheter terminating in the distal ureter.  Two nonobstructing right renal calculi measuring up to 3 mm.  No right ureteral calculi.  Multiple nonobstructing left renal calculi measuring up to 3 mm. No hydronephrosis.  Layering bladder calculi measuring up to 4 mm.  Indwelling Foley catheter.   Original Report Authenticated By: Charline Bills, M.D.   Dg C-arm 61-120 Min-no Report  01/25/2013   CLINICAL DATA: ureteral stone   C-ARM 61-120 MINUTES  Fluoroscopy was utilized by the requesting physician.  No  radiographic  interpretation.     EKG: Ordered.  Assessment/Plan Active Problems:   Tachycardia -The patient's tachycardia likely is multifactorial with adrenergic stimulation from his recent infection and current microcytic anemia contributory. -Does not currently meet the threshold for benefit of transfusion therapy. -Will place on low dose beta blocker therapy and check a 12-lead EKG.   Hyponatremia -Serum osmolality is normal. Urine osmolality is 428 and urine sodium is currently pending. Given the normal serum osmolality, check serum protein and triglycerides. If the patient received a lot of fluids transiently during his lithotripsy procedure, this could also explain his hyponatremia and should improve as his body removes the free water excess. -  Current serum sodium is stable. Patient's are not typically symptomatic until sodium falls below 120. -TSH was checked on 01/20/2013 and was within normal limits, so no need to repeat this now.   Microcytic anemia -Will check iron studies. The patient may benefit from iron replacement therapy.   Thank you for this consultation.  We will follow the patient with you. Time spent: 1 hour  RAMA,CHRISTINA [facility] Pager [phone]  If 7PM-7AM, please contact night-coverage [email or web address] Password [email or web address]

## 2013-01-27 NOTE — Progress Notes (Signed)
Palliative Medicine Team [name] discussed in PMT round, came by for emotional support. Pt receiving procedure from MD and RNs at time of visit. Will follow up as time allows.   [name], LCSW PMT Phone [phone] Pager [phone]

## 2013-01-28 DIAGNOSIS — I369 Nonrheumatic tricuspid valve disorder, unspecified: Secondary | ICD-10-CM

## 2013-01-28 DIAGNOSIS — I472 Ventricular tachycardia: Secondary | ICD-10-CM

## 2013-01-28 LAB — COMPREHENSIVE METABOLIC PANEL
BUN: 20 mg/dL (ref 6–23)
CO2: 30 mEq/L (ref 19–32)
Calcium: 9 mg/dL (ref 8.4–10.5)
Creatinine, Ser: 0.68 mg/dL (ref 0.50–1.35)
GFR calc Af Amer: >90 mL/min (ref >90)
GFR calc non Af Amer: >90 mL/min (ref >90)
Glucose, Bld: 103 mg/dL — ABNORMAL HIGH (ref 70–99)
Total Protein: 7.9 g/dL (ref 6.0–8.3)

## 2013-01-28 LAB — IRON AND TIBC
Iron: 25 ug/dL — ABNORMAL LOW (ref 42–135)
UIBC: 158 ug/dL (ref 125–400)

## 2013-01-28 LAB — LIPID PANEL
Cholesterol: 110 mg/dL (ref 0–200)
HDL: 27 mg/dL — ABNORMAL LOW (ref >39)
Total CHOL/HDL Ratio: 4.1 RATIO
Triglycerides: 61 mg/dL (ref <150)

## 2013-01-28 LAB — FERRITIN: Ferritin: 577 ng/mL — ABNORMAL HIGH (ref 22–322)

## 2013-01-28 MED ORDER — METOPROLOL SUCCINATE ER 25 MG PO TB24: 25.0000 mg | ORAL_TABLET | Freq: Every day | ORAL | Status: DC

## 2013-01-28 NOTE — Discharge Summary (Signed)
Physician Discharge Summary  Patient ID: [name] Mccoy. Leeson MRN: [id number] DOB/AGE: 50-May-1962 73 y.o.  Admit date: 01/25/2013 Discharge date: 01/28/2013  Admission Diagnoses:  Right ureteral stone  Discharge Diagnoses:  Principal Problem:   Right ureteral stone Active Problems:   Tachycardia   Hypokalemia   Anemia   Renal calculus, right   Hyponatremia   Microcytic anemia   Past Medical History  Diagnosis Date  . Multiple sclerosis   . Pressure ulcer, buttock(707.05)   . Pain in joint, pelvic region and thigh   . Spasm of muscle   . Unspecified vitamin D deficiency   . Disturbance of skin sensation   . Right ureteral stone 01/03/13  . Renal stones   . Neurogenic bladder     01/13/13 FOLEY CATH  . Urinary retention with incomplete bladder emptying 01/13/13    FOLEY CATHETER  . Neuromuscular disorder     multiple schlorosis since 1983  . Hyponatremia 01/27/2013    Surgeries: Procedure(s): RIGHT PERCUTANEOUS NEPHROLITHOTOMY  on 01/25/2013   Consultants (if any): Treatment Team:  Palliative Llana Aliment, MD  Discharged Condition: Improved  Hospital Course: [name] Mccoy. Bjelland is an 52 y.o. male who was admitted 01/25/2013 with a diagnosis of Right ureteral stone and right renal stones following a bout of sepsis from obstruction.  He also has a neurogenic bladder that is now managed with a foley. and went to the operating room on 01/25/2013 and underwent the above named procedures.  Postoperatively he did well but had some issues with anemia that had improved prior to discharge without transfusion.  He had hypokalemia that was also improving and of uncertain etiology.  He had tachycardia that was exacerbated postop.  A hospitalist consult was obtained and he was given a beta blocker with improvement.   A CT on Wed showed only a small residual right fragment that wasn't felt to require a second look.  His Tube was removed on Thursday and he has done well since and is felt  to be ready for discharge today.    He was given perioperative antibiotics:  Anti-infectives   Start     Dose/Rate Route Frequency Ordered Stop   01/26/13 0200  ciprofloxacin (CIPRO) IVPB 200 mg     200 mg 100 mL/hr over 60 Minutes Intravenous Every 12 hours 01/25/13 1724     01/25/13 0922  ciprofloxacin (CIPRO) IVPB 400 mg     400 mg 200 mL/hr over 60 Minutes Intravenous 60 min pre-op 01/25/13 0922 01/25/13 1345    .    He benefited maximally from the hospital stay and the complications are noted above.    Recent vital signs:  Filed Vitals:   01/28/13 0600  BP: 100/76  Pulse: 101  Temp:   Resp: 14    Recent laboratory studies:  Lab Results  Component Value Date   HGB 8.7* 01/28/2013   HGB 8.1* 01/27/2013   HGB 8.8* 01/26/2013   Lab Results  Component Value Date   WBC 9.9 01/25/2013   PLT 724* 01/25/2013   Lab Results  Component Value Date   INR 1.39 01/03/2013   Lab Results  Component Value Date   NA 133* 01/28/2013   K 4.1 01/28/2013   CL 98 01/28/2013   CO2 30 01/28/2013   BUN 20 01/28/2013   CREATININE 0.68 01/28/2013   GLUCOSE 103* 01/28/2013    Discharge Medications:     Medication List    ASK your doctor about these medications  amantadine 100 MG capsule  Commonly known as:  SYMMETREL  Take 100 mg by mouth 2 (two) times daily.     collagenase ointment  Commonly known as:  SANTYL  Apply topically daily. Apply to Unstageable pressure ulcer on coccyx once daily.  Apply 1/8 inch thick and top with saline dressing.  Change daily and PRN for soiling or loosening of dressing x 21 days.     dantrolene 25 MG capsule  Commonly known as:  DANTRIUM  Take 25 mg by mouth 3 (three) times daily.     ezetimibe 10 MG tablet  Commonly known as:  ZETIA  Take 10 mg by mouth at bedtime.     feeding supplement Liqd  Take 237 mLs by mouth 3 (three) times daily between meals.     nutrition supplement Pack  Take 1 packet by mouth 2 (two) times daily after a meal.      gabapentin 600 MG tablet  Commonly known as:  NEURONTIN  Take 600 mg by mouth 4 (four) times daily.     HYDROcodone-acetaminophen 5-325 MG per tablet  Commonly known as:  NORCO/VICODIN  Take 2 tablets by mouth. Take 1 tablet every 8 hours as needed for pain(1-5) Take 2 tablets every 8 hours as needed for pain (6-10).     Interferon Beta-1b 0.3 MG Kit injection  Commonly known as:  BETASERON  Inject 0.25 mg into the skin every other day.     modafinil 200 MG tablet  Commonly known as:  PROVIGIL  Take 200 mg by mouth 2 (two) times daily.     multivitamin with minerals Tabs tablet  Take 1 tablet by mouth daily.     tiZANidine 4 MG tablet  Commonly known as:  ZANAFLEX  Take 4 mg by mouth 4 (four) times daily.        Diagnostic Studies: Ct Abdomen Pelvis Wo Contrast  01/26/2013   *RADIOLOGY REPORT*  Clinical Data: Status post right percutaneous nephrostomy, evaluate for residual renal/ureteral stones  CT ABDOMEN AND PELVIS WITHOUT CONTRAST  Technique:  Multidetector CT imaging of the abdomen and pelvis was performed following the standard protocol without intravenous contrast.  Comparison: 01/03/2013  Findings: Emphysematous changes at the lung bases.  Unenhanced liver, spleen, pancreas, and adrenal glands are within normal limits.  Multiple layering gallstones, without gallbladder distension or associated inflammatory changes.  No intrahepatic or extrahepatic ductal dilatation.  Large bore right nephrostomy catheter.  Additional small bore nephroureterostomy catheter terminates in the distal ureter.  Punctate nonobstructing right upper pole renal calculus (series 2/image 26).  3 mm nonobstructing interpolar right renal calculus (series 2/image 31).  Gas within the proximal collecting system/ureter related to catheter placement.  No ureteral calculi are seen.  Multiple small nonobstructing left renal calculi measuring up to 3 mm in the lower pole (series 2/image 34).  No hydronephrosis.  No  evidence of bowel obstruction.  Moderate colonic stool burden.  Atherosclerotic calcifications of the abdominal aorta and branch vessels.  No abdominopelvic ascites.  No suspicious abdominopelvic lymphadenopathy.  Prostate is unremarkable.  Bladder is decompressed with indwelling Foley catheter and associated gas.  Scattered small bladder calculi in the left dependent bladder measuring up to 4 mm (series 2/image 77).  Stable dysmorphic pelvis with right hip subluxation and minimally displaced left femoral neck fracture (series 2/image 86). Sclerosis/irregularity of the left iliac tuberosity (series 2/image 64), likely reflecting prior/chronic osteomyelitis related to a sacral decubitus ulcer.  Mild degenerative changes at L5-S1.  IMPRESSION: Status  post right nephrostomy with small bore nephroureterostomy catheter terminating in the distal ureter.  Two nonobstructing right renal calculi measuring up to 3 mm.  No right ureteral calculi.  Multiple nonobstructing left renal calculi measuring up to 3 mm. No hydronephrosis.  Layering bladder calculi measuring up to 4 mm.  Indwelling Foley catheter.   Original Report Authenticated By: Charline Bills, M.D.   Ct Abdomen Pelvis Wo Contrast  01/03/2013   *RADIOLOGY REPORT*  Clinical Data: Generalized weakness.  Question UTI.  Lethargic. Bed sores.  Sore throat.  Multiple sclerosis.  CT ABDOMEN AND PELVIS WITHOUT CONTRAST  Technique:  Multidetector CT imaging of the abdomen and pelvis was performed following the standard protocol without intravenous contrast.  Comparison: 07/28/2011.  Findings: Proximal right ureteral 1.3 cm obstructing stone with mild to moderate right-sided hydronephrosis.  Several nonobstructing right renal calculi noted.  Small nonobstructing left lower pole renal calculi.  There is fullness of the left renal collecting system.  No definitive left ureteral obstructing stone.  Fullness of the collecting systems may be partially explained by bladder  outlet obstruction as the bladder is prominent in size. Calcification along the posterior aspect of the bladder wall may reflect changes of chronic distension/inflammation.  Right lung base 3.7 mm nodule (series 4 image 7) not previously imaged. Given risk factors for bronchogenic carcinoma, follow-up chest CT at 1 year is recommended.  This recommendation follows the consensus statement:  Guidelines for Management of Small Pulmonary Nodules Detected on CT Scans:  A Statement from the Fleischner Society as published in Radiology 2005; 237:395-400.  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  No CT evidence of pericholecystic inflammation.  Ultrasound can be obtained for further delineation if this is of clinical concern.  Circumferential thickening rectosigmoid region may be related to under distension.  Inflammation not excluded.  No free intraperitoneal air.  Evaluation of solid abdominal viscera is limited by lack of IV contrast.  Taking this limitation into account:  No worrisome hepatic, splenic, adrenal or pancreatic lesion.  Left femoral neck fracture with fragmentation of the left femoral head and acetabulum.  This is new from the prior exam.  Subluxed/partially dislocated right femoral head with impaction against the posterior acetabulum.  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  Sacral decubiti with sclerotic appearance of the left ilium new from prior examination.  Osteomyelitis is a possibility.  IMPRESSION:  Proximal right ureteral 1.3 cm obstructing stone with mild to moderate right-sided hydronephrosis.  Several nonobstructing right renal calculi noted.  Small nonobstructing left lower pole renal calculi.  There is fullness of the left renal collecting system.  No definitive left ureteral obstructing stone.  Fullness of the collecting systems may be partially explained by bladder outlet obstruction as the bladder  is prominent in size. Calcification along the posterior aspect of the bladder wall may reflect changes of chronic distension/inflammation.  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  No CT evidence of pericholecystic inflammation.  Ultrasound can be obtained for further delineation if this is of clinical concern.  Circumferential thickening rectosigmoid region may be related to under distension.  Inflammation not excluded.  No free intraperitoneal air.  Left femoral neck fracture with fragmentation of the left femoral head and acetabulum.  This is new from the prior exam.  Subluxed/partially dislocated right femoral head with impaction against the posterior acetabulum.  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  Sacral decubiti  with sclerotic appearance of the left ilium new from prior examination.  Osteomyelitis is a possibility.  Right lung base 3.7 mm nodule (series 4 image 7) not previously imaged. Follow up as noted above.   Original Report Authenticated By: Lacy Duverney, M.D.   US Renal  01/03/2013   *RADIOLOGY REPORT*  Clinical Data: Renal insufficiency  RENAL / URINARY TRACT ULTRASOUND  Technique:  Complete ultrasound exam of the kidneys and urinary bladder was performed.  Comparison: CT scan from 01/03/2013  Findings:  The right kidney measures 13.3  cm in long axis.  The left kidney measures 12.8 cm.  Mild to moderate left hydronephrosis is evident and while comparing across modalities is difficult, the degree of collecting system distention appears to be roughly similar to the previous CT scan. At least two stones in the 5-6 mm size range are seen in the left kidney towards the lower pole.  There is mild to moderate right-sided hydronephrosis with multiple right renal stones evident.  Images in the central anatomic pelvis reveal a distended bladder with posterior bladder wall thickening and intraluminal debris.   Incidental imaging of the gallbladder shows multiple gallstones.  Impression:  Bilateral mild to moderate hydronephrosis with bilateral renal stones.  There is seen to be some associated dilatation of the proximal right ureter although an etiology for the bilateral hydronephrosis is not evident on today's study.  The patient has multiple bilateral renal stones and the obstruction may be secondary to the stone disease.  Posterior bladder wall thickening with debris in the urinary bladder.  Bladder infection would be a consideration.   Original Report Authenticated By: Kennith Center, M.D.   Ir Perc Nephrostomy Right  01/03/2013   *RADIOLOGY REPORT*  Clinical Data:  Urosepsis.  Obstructing right proximal ureteral calculus with hydronephrosis.  RIGHT PERCUTANEOUS NEPHROSTOMY CATHETER PLACEMENT UNDER ULTRASOUND AND FLUOROSCOPIC GUIDANCE  Technique: The procedure, risks (including but not limited to bleeding, infection, organ damage), benefits, and alternatives were explained to the patient.  Questions regarding the procedure were encouraged and answered.  The patient understands and consents to the procedure. The rightflank region prepped with Betadine, draped in usual sterile fashion, infiltrated locally with 1% lidocaine.The patient was receiving adequate antibiotic prophylactic coverage.  Intravenous Fentanyl and Versed were administered as conscious sedation during continuous cardiorespiratory monitoring by the radiology RN, with a total moderate sedation time of in less than 30 minutes.   Under real-time ultrasound guidance, a 21-gauge trocar needle was advanced into a posterior lower pole calyx. Ultrasound image documentation was saved. No definite to urine could be aspirated. The collecting system was not significantly hydronephrotic. Therefore, under fluoroscopy a 21-gauge micropuncture needle was advanced towards a posterior lower pole radiodense calculus.  A 018 guide wire advanced easily within the renal  collecting system. Needle was exchanged over  guidewire for transitional dilator. Contrast injection confirmed appropriate positioning. Catheter was exchanged over a guidewire for a 10 French pigtail catheter, formed centrally within the right renal collecting system. Contrast injection confirms appropriate positioning and patency. Catheter secured externally with 0 Prolene suture and placed to external drain bag. No immediate complication.  Fluoroscopy time: 2 minutes 6seconds  IMPRESSION Technically successful right percutaneous nephrostomy catheter placement.   Original Report Authenticated By: D. Andria Rhein, MD   Ir US Guide Bx Asp/drain  01/05/2013   *RADIOLOGY REPORT*  Clinical Data:  Urosepsis.  Obstructing right proximal ureteral calculus with hydronephrosis.  RIGHT PERCUTANEOUS NEPHROSTOMY CATHETER PLACEMENT UNDER ULTRASOUND AND FLUOROSCOPIC GUIDANCE  Technique: The  procedure, risks (including but not limited to bleeding, infection, organ damage), benefits, and alternatives were explained to the patient.  Questions regarding the procedure were encouraged and answered.  The patient understands and consents to the procedure. The rightflank region prepped with Betadine, draped in usual sterile fashion, infiltrated locally with 1% lidocaine.The patient was receiving adequate antibiotic prophylactic coverage.  Intravenous Fentanyl and Versed were administered as conscious sedation during continuous cardiorespiratory monitoring by the radiology RN, with a total moderate sedation time of in less than 30 minutes.   Under real-time ultrasound guidance, a 21-gauge trocar needle was advanced into a posterior lower pole calyx. Ultrasound image documentation was saved. No definite to urine could be aspirated. The collecting system was not significantly hydronephrotic. Therefore, under fluoroscopy a 21-gauge micropuncture needle was advanced towards a posterior lower pole radiodense calculus.  A 018 guide wire  advanced easily within the renal collecting system. Needle was exchanged over  guidewire for transitional dilator. Contrast injection confirmed appropriate positioning. Catheter was exchanged over a guidewire for a 10 French pigtail catheter, formed centrally within the right renal collecting system. Contrast injection confirms appropriate positioning and patency. Catheter secured externally with 0 Prolene suture and placed to external drain bag. No immediate complication.  Fluoroscopy time: 2 minutes 6seconds  IMPRESSION Technically successful right percutaneous nephrostomy catheter placement.   Original Report Authenticated By: D. Andria Rhein, MD   Dg Chest Portable 1 View  01/20/2013   *RADIOLOGY REPORT*  Clinical Data: Fever and dehydration  PORTABLE CHEST - 1 VIEW  Comparison: Prior radiograph from 01/03/2013  Findings: Cardiac and mediastinal silhouettes are unchanged.  The lungs are per inflated.  Perihilar linear opacities, most prompt within the right perihilar region are again seen, most consistent with scarring.  No new focal infiltrate is identified. There is no pulmonary edema or pleural effusion.  No pneumothorax.  Pigtail catheter is partially seen in the right abdomen.  No acute osseous abnormality.  IMPRESSION: Similar appearance of the lungs with persistent hyperinflation and right upper lobe parenchymal scarring.  No acute cardiopulmonary process identified.   Original Report Authenticated By: Rise Mu, M.D.   Dg Chest Portable 1 View  01/03/2013   *RADIOLOGY REPORT*  Clinical Data: Fever, dizziness and weakness.  PORTABLE CHEST - 1 VIEW  Comparison: Single view of the chest 10/12/2012 and 09/15/2012.  Findings: Again seen is right upper lobe scarring.  The lungs appear emphysematous.  No pneumothorax or pleural effusion is identified.  Heart size is normal.  IMPRESSION:  1.  No acute finding. 2.  Pulmonary hyperinflation and right upper lobe scarring, unchanged.   Original Report  Authenticated By: Holley Dexter, M.D.   Dg Hip Portable 1 View Right  01/20/2013   *RADIOLOGY REPORT*  Clinical Data: Fever.  Decubitus wound.  PORTABLE RIGHT HIP - 1 VIEW  Comparison: CT abdomen pelvis 01/03/2013.  Findings: Single view obtained due to patient contracture.  There is chronic subluxation of the right hip posteriorly.  Subchondral sclerosis of the right femoral head, consistent with osteonecrosis. There is soft tissue heterotopic ossification about the hip joint.  Skin thickening in the right gluteal region.  No foreign body or osseous erosion is seen.  IMPRESSION: 1.  Limited exam due to patient contracture.  2.  No evidence of osseous infection.  3.  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   Original Report Authenticated By: Tiburcio Pea   Dg C-arm 61-120 Min-no Report  01/25/2013   CLINICAL DATA: ureteral stone  C-ARM 61-120 MINUTES  Fluoroscopy was utilized by the requesting physician.  No radiographic  interpretation.     Disposition: 01-Home or Self Care       Future Appointments Provider Department Dept Phone   01/31/2013 11:30 AM Suanne Marker, MD [facility] [phone]     I will set him up for f/u with me in 1 month for a foley catheter change.   He will need a repeat CT in 3 months to reassess his stone burden.     SignedAnner Crete 01/28/2013, 7:26 AM

## 2013-01-28 NOTE — Progress Notes (Signed)
CSW received notification from RN that pt will need ambulance transport home at d/c.  CSW confirmed address with pt at bedside and provided needed paperwork for ambulance transportation in pt chart on the wall.  CSW provided RN phone number to arrange ambulance transportation for after 5 pm ([phone] and option 3 for non-emergency transport).  No further social work needs identified at this time.   CSW signing off.   Jacklynn Lewis, MSW, LCSWA  Clinical Social Work [phone]

## 2013-01-28 NOTE — Progress Notes (Signed)
Palliative Medicine Team SW Psychosocial follow up. Met with pt alone at bedside to introduce myself and role for emotional support. Pt appreciative of visit, reports good emotional coping, a bit guarded in conversation. Pt reports plan to return home to "focus on healing". Pt denies having local family, reports support from various friends and neighbors who check in on him. Pt reports feeling confident that he can continue to manage at home with help of a paid caregiver. Pt declined offers for chaplain or other faith support. Note d/c summary, available as needed.   [name], LCSW PMT Phone [phone] Pager [phone]

## 2013-01-28 NOTE — Progress Notes (Signed)
*  PRELIMINARY RESULTS* Echocardiogram 2D Echocardiogram has been performed.  [name] Mccoy 01/28/2013, 3:32 PM

## 2013-01-28 NOTE — Progress Notes (Signed)
[facility] PROGRESS NOTE  [name]. [name] JYN:[id number] DOB: 1960/08/06 DOA: 01/25/2013 PCP: Londell Moh, MD  Assessment/Plan: Hyponatremia -Resolved with IVF.  NSVT -Will order 2D ECHO. -If depressed EF, will need cardiology evaluation in prior to DC. -If EF ok, then DC on metoprolol with OP cards follow up.  Code Status: Full Family Communication: Patient only  Disposition Plan: Pending result of ECHO.   Antibiotics:  Cipro   Subjective: No new issues.  Objective: Filed Vitals:   01/28/13 1253 01/28/13 1400 01/28/13 1600 01/28/13 1707  BP: 106/75 105/69 88/70 90/55   Pulse: 117 113 120 116  Temp:      TempSrc:      Resp:  15 20 19   Height:      Weight:      SpO2:  100% 100% 100%    Intake/Output Summary (Last 24 hours) at 01/28/13 1721 Last data filed at 01/28/13 1700  Gross per 24 hour  Intake    897 ml  Output    603 ml  Net    294 ml   Filed Weights   01/13/13 1308 01/25/13 1021 01/25/13 1718  Weight: 63.05 kg (139 lb) 55.877 kg (123 lb 3 oz) 62.9 kg (138 lb 10.7 oz)    Exam:   General:  AA Ox3  Cardiovascular: Tachy, regular  Respiratory: CTA B  Abdomen: S/NT/ND/+BS  Extremities: Contractures   Neurologic:  CN intact  Data Reviewed: Basic Metabolic Panel:  Recent Labs Lab 01/25/13 1000 01/25/13 1635 01/27/13 0330 01/27/13 1000 01/28/13 0328  NA 135 132* 126* 129* 133*  K 3.9 3.9 4.9 4.1 4.1  CL 99 98 92* 93* 98  CO2 29 28 26 27 30   GLUCOSE 109* 124* 109* 135* 103*  BUN 14 15 18 15 20   CREATININE 0.68 0.68 0.63 0.69 0.68  CALCIUM 9.3 9.1 8.5 8.6 9.0   Liver Function Tests:  Recent Labs Lab 01/28/13 0328  AST 11  ALT 10  ALKPHOS 92  BILITOT 0.1*  PROT 7.9  ALBUMIN 2.3*   No results found for this basename: LIPASE, AMYLASE,  in the last 168 hours No results found for this basename: AMMONIA,  in the last 168 hours CBC:  Recent Labs Lab 01/25/13 1000 01/25/13 1635 01/26/13 0336 01/27/13 0330  01/28/13 0328  WBC 9.9  --   --   --   --   HGB 10.3* 9.7* 8.8* 8.1* 8.7*  HCT 33.2* 30.1* 28.3* 26.2* 27.9*  MCV 77.2*  --   --   --   --   PLT 724*  --   --   --   --    Cardiac Enzymes: No results found for this basename: CKTOTAL, CKMB, CKMBINDEX, TROPONINI,  in the last 168 hours BNP (last 3 results) No results found for this basename: PROBNP,  in the last 8760 hours CBG: No results found for this basename: GLUCAP,  in the last 168 hours  Recent Results (from the past 240 hour(s))  URINE CULTURE     Status: None   Collection Time    01/20/13  2:28 AM      Result Value Range Status   Specimen Description URINE, CATHETERIZED   Final   Special Requests NONE   Final   Culture  Setup Time     Final   Value: 01/20/2013 10:09     Performed at [facility] Count     Final   Value: NO GROWTH  Performed at [facility]     Final   Value: NO GROWTH     Performed at [facility]   Report Status 01/21/2013 FINAL   Final  CULTURE, BLOOD (ROUTINE X 2)     Status: None   Collection Time    01/20/13  4:20 AM      Result Value Range Status   Specimen Description BLOOD RIGHT HAND   Final   Special Requests BOTTLES DRAWN AEROBIC AND ANAEROBIC 5 CC EACH   Final   Culture  Setup Time     Final   Value: 01/20/2013 10:07     Performed at [facility]   Culture     Final   Value: NO GROWTH 5 DAYS     Performed at [facility]   Report Status 01/26/2013 FINAL   Final  CULTURE, BLOOD (ROUTINE X 2)     Status: None   Collection Time    01/20/13  4:30 AM      Result Value Range Status   Specimen Description BLOOD LEFT HAND   Final   Special Requests BOTTLES DRAWN AEROBIC AND ANAEROBIC [facility] EACH   Final   Culture  Setup Time     Final   Value: 01/20/2013 10:07     Performed at [facility]   Culture     Final   Value: NO GROWTH 5 DAYS     Performed at [facility]   Report Status 01/26/2013 FINAL   Final   MRSA PCR SCREENING     Status: None   Collection Time    01/20/13  6:45 AM      Result Value Range Status   MRSA by PCR NEGATIVE  NEGATIVE Final   Comment:            The GeneXpert MRSA Assay (FDA     approved for NASAL specimens     only), is one component of a     comprehensive MRSA colonization     surveillance program. It is not     intended to diagnose MRSA     infection nor to guide or     monitor treatment for     MRSA infections.     Studies: No results found.  Scheduled Meds: . amantadine  100 mg Oral BID  . ciprofloxacin  200 mg Intravenous Q12H  . collagenase   Topical Daily  . dantrolene  25 mg Oral TID  . docusate sodium  100 mg Oral BID  . ezetimibe  10 mg Oral QHS  . feeding supplement  237 mL Oral TID BM  . gabapentin  600 mg Oral QID  . Interferon Beta-1b  0.25 mg Subcutaneous QODAY  . metoprolol succinate  25 mg Oral Daily  . modafinil  200 mg Oral Custom  . multivitamin with minerals  1 tablet Oral Daily  . nutrition supplement  1 packet Oral BID PC  . tiZANidine  4 mg Oral QID   Continuous Infusions: . sodium chloride 1,000 mL (01/28/13 1529)    Principal Problem:   Right ureteral stone Active Problems:   Tachycardia   Hypokalemia   Anemia   Renal calculus, right   Hyponatremia   Microcytic anemia    Time spent: 35 minutes    HERNANDEZ ACOSTA,ESTELA  [facility] Pager [phone]  If 7PM-7AM, please contact night-coverage at [email or web address], password [facility] 01/28/2013, 5:21 PM  LOS: 3 days

## 2013-01-29 DIAGNOSIS — G35 Multiple sclerosis: Secondary | ICD-10-CM

## 2013-01-29 DIAGNOSIS — I472 Ventricular tachycardia: Secondary | ICD-10-CM

## 2013-01-29 NOTE — Progress Notes (Signed)
Patient ID: [name] Mccoy, male   DOB: June 11, 1960, 52 y.o.   MRN: [id number]  Pt without complaint.   GU - urine clear yellow.   Imp - s/p PCNL   Plan - stable from GU point of view Disposition pending result of ECHO - appreciate hospitalist excellent care.

## 2013-01-29 NOTE — Progress Notes (Signed)
[facility] PROGRESS NOTE  [name] Mccoy. [name] Mccoy HYQ:[id number] DOB: [birth year] DOA: 01/25/2013 PCP: Londell Moh, MD  Assessment/Plan: Hyponatremia -Resolved with IVF.  NSVT -Will order 2D ECHO. -If depressed EF, will need cardiology evaluation in prior to DC. -If EF ok, then DC on metoprolol with OP cards follow up.  Code Status: Full Family Communication: Patient only  Disposition Plan: Pending result of ECHO.   Antibiotics:  Cipro   Subjective: No new issues.  Objective: Filed Vitals:   01/29/13 0400 01/29/13 0500 01/29/13 0600 01/29/13 0700  BP: 102/72  106/76   Pulse: 105 103    Temp:      TempSrc:      Resp: 13 14 13 16   Height:      Weight:      SpO2: 99% 100%      Intake/Output Summary (Last 24 hours) at 01/29/13 0833 Last data filed at 01/29/13 9528  Gross per 24 hour  Intake   1840 ml  Output   2543 ml  Net   -703 ml   Filed Weights   01/25/13 1021 01/25/13 1718 01/29/13 0300  Weight: 55.877 kg (123 lb 3 oz) 62.9 kg (138 lb 10.7 oz) 58.6 kg (129 lb 3 oz)    Exam:   General:  AA Ox3  Cardiovascular: Tachy, regular  Respiratory: CTA B  Abdomen: S/NT/ND/+BS  Extremities: Contractures   Neurologic:  CN intact  Data Reviewed: Basic Metabolic Panel:  Recent Labs Lab 01/25/13 1000 01/25/13 1635 01/27/13 0330 01/27/13 1000 01/28/13 0328  NA 135 132* 126* 129* 133*  K 3.9 3.9 4.9 4.1 4.1  CL 99 98 92* 93* 98  CO2 29 28 26 27 30   GLUCOSE 109* 124* 109* 135* 103*  BUN 14 15 18 15 20   CREATININE 0.68 0.68 0.63 0.69 0.68  CALCIUM 9.3 9.1 8.5 8.6 9.0   Liver Function Tests:  Recent Labs Lab 01/28/13 0328  AST 11  ALT 10  ALKPHOS 92  BILITOT 0.1*  PROT 7.9  ALBUMIN 2.3*   No results found for this basename: LIPASE, AMYLASE,  in the last 168 hours No results found for this basename: AMMONIA,  in the last 168 hours CBC:  Recent Labs Lab 01/25/13 1000 01/25/13 1635 01/26/13 0336 01/27/13 0330 01/28/13 0328   WBC 9.9  --   --   --   --   HGB 10.3* 9.7* 8.8* 8.1* 8.7*  HCT 33.2* 30.1* 28.3* 26.2* 27.9*  MCV 77.2*  --   --   --   --   PLT 724*  --   --   --   --    Cardiac Enzymes: No results found for this basename: CKTOTAL, CKMB, CKMBINDEX, TROPONINI,  in the last 168 hours BNP (last 3 results) No results found for this basename: PROBNP,  in the last 8760 hours CBG: No results found for this basename: GLUCAP,  in the last 168 hours  Recent Results (from the past 240 hour(s))  URINE CULTURE     Status: None   Collection Time    01/20/13  2:28 AM      Result Value Range Status   Specimen Description URINE, CATHETERIZED   Final   Special Requests NONE   Final   Culture  Setup Time     Final   Value: 01/20/2013 10:09     Performed at [facility] Count     Final   Value: NO GROWTH     Performed  at [facility]     Final   Value: NO GROWTH     Performed at [facility]   Report Status 01/21/2013 FINAL   Final  CULTURE, BLOOD (ROUTINE X 2)     Status: None   Collection Time    01/20/13  4:20 AM      Result Value Range Status   Specimen Description BLOOD RIGHT HAND   Final   Special Requests BOTTLES DRAWN AEROBIC AND ANAEROBIC 5 CC EACH   Final   Culture  Setup Time     Final   Value: 01/20/2013 10:07     Performed at [facility]   Culture     Final   Value: NO GROWTH 5 DAYS     Performed at [facility]   Report Status 01/26/2013 FINAL   Final  CULTURE, BLOOD (ROUTINE X 2)     Status: None   Collection Time    01/20/13  4:30 AM      Result Value Range Status   Specimen Description BLOOD LEFT HAND   Final   Special Requests BOTTLES DRAWN AEROBIC AND ANAEROBIC [facility] EACH   Final   Culture  Setup Time     Final   Value: 01/20/2013 10:07     Performed at [facility]   Culture     Final   Value: NO GROWTH 5 DAYS     Performed at [facility]   Report Status 01/26/2013 FINAL   Final  MRSA PCR SCREENING      Status: None   Collection Time    01/20/13  6:45 AM      Result Value Range Status   MRSA by PCR NEGATIVE  NEGATIVE Final   Comment:            The GeneXpert MRSA Assay (FDA     approved for NASAL specimens     only), is one component of a     comprehensive MRSA colonization     surveillance program. It is not     intended to diagnose MRSA     infection nor to guide or     monitor treatment for     MRSA infections.     Studies: No results found.  Scheduled Meds: . amantadine  100 mg Oral BID  . ciprofloxacin  200 mg Intravenous Q12H  . collagenase   Topical Daily  . dantrolene  25 mg Oral TID  . docusate sodium  100 mg Oral BID  . ezetimibe  10 mg Oral QHS  . feeding supplement  237 mL Oral TID BM  . gabapentin  600 mg Oral QID  . Interferon Beta-1b  0.25 mg Subcutaneous QODAY  . metoprolol succinate  25 mg Oral Daily  . modafinil  200 mg Oral Custom  . multivitamin with minerals  1 tablet Oral Daily  . nutrition supplement  1 packet Oral BID PC  . tiZANidine  4 mg Oral QID   Continuous Infusions: . sodium chloride 10 mL/hr at 01/28/13 2200    Principal Problem:   Right ureteral stone Active Problems:   Tachycardia   Hypokalemia   Anemia   Renal calculus, right   Hyponatremia   Microcytic anemia   NSVT (nonsustained ventricular tachycardia)    Time spent: 35 minutes    [name] Mccoy,[name] Mccoy  [facility] Pager [phone]  If 7PM-7AM, please contact night-coverage at [email or web address], password [facility] 01/29/2013, 8:33 AM  LOS:  4 days

## 2013-01-29 NOTE — Progress Notes (Signed)
[name] Mccoy 1228 TRANSFERRED TO 4TH FLOOR- REPORT CALLED TO KELSEY RN.

## 2013-01-30 NOTE — Discharge Summary (Signed)
Physician Discharge Summary  Patient ID: [name] Mccoy. [name] Mccoy MRN: [id number] DOB/AGE: 08-16-1960 52 y.o.  Admit date: 01/25/2013 Discharge date: 01/30/2013  Admission Diagnoses:  Discharge Diagnoses:  Principal Problem:   Right ureteral stone Active Problems:   Tachycardia   Hypokalemia   Anemia   Renal calculus, right   Hyponatremia   Microcytic anemia   NSVT (nonsustained ventricular tachycardia)   Discharged Condition: fair  Hospital Course: Patient admitted following Right percutaneous nephrolithotomy with removal of an 18 mm right upper pole stone and 13 mm right ureteral stone length greater than 3 cm. He was seen POD#2 by medicine for tachycardia, hyponatremia, and anemia. He was given a beta blocker with improvement. A post-op CT showed only a small residual right fragment that wasn't felt to require a second look. His Tube was removed on POD#1 and he tolerated this well. He underwent 2D echo and by report had a normal EF therefore medicine felt he was stable for discharge.      Consults: medicine  Significant Diagnostic Studies: 2D echo, CT A/P  Treatments: surgery: Right percutaneous nephrolithotomy with removal of an 18 mm right upper pole stone and 13 mm right ureteral stone length greater than 3 cm  Discharge Exam: Blood pressure 100/72, pulse 119, temperature 99.3 F (37.4 C), temperature source Oral, resp. rate 18, height 6\' 3"  (1.905 m), weight 58 kg (127 lb 13.9 oz), SpO2 98.00%. NAD A&Ox3 Abd - soft, NT Urine clear yellow  Disposition: 01-Home or Self Care  Discharge Orders   Future Appointments Provider Department Dept Phone   01/31/2013 11:30 AM Suanne Marker, MD [facility] [phone]   Future Orders Complete By Expires   Discontinue IV  As directed        Medication List         amantadine 100 MG capsule  Commonly known as:  SYMMETREL  Take 100 mg by mouth 2 (two) times daily.     collagenase ointment  Commonly  known as:  SANTYL  Apply topically daily. Apply to Unstageable pressure ulcer on coccyx once daily.  Apply 1/8 inch thick and top with saline dressing.  Change daily and PRN for soiling or loosening of dressing x 21 days.     dantrolene 25 MG capsule  Commonly known as:  DANTRIUM  Take 25 mg by mouth 3 (three) times daily.     ezetimibe 10 MG tablet  Commonly known as:  ZETIA  Take 10 mg by mouth at bedtime.     feeding supplement Liqd  Take 237 mLs by mouth 3 (three) times daily between meals.     nutrition supplement Pack  Take 1 packet by mouth 2 (two) times daily after a meal.     gabapentin 600 MG tablet  Commonly known as:  NEURONTIN  Take 600 mg by mouth 4 (four) times daily.     HYDROcodone-acetaminophen 5-325 MG per tablet  Commonly known as:  NORCO/VICODIN  Take 2 tablets by mouth. Take 1 tablet every 8 hours as needed for pain(1-5) Take 2 tablets every 8 hours as needed for pain (6-10).     Interferon Beta-1b 0.3 MG Kit injection  Commonly known as:  BETASERON  Inject 0.25 mg into the skin every other day.     metoprolol succinate 25 MG 24 hr tablet  Commonly known as:  TOPROL-XL  Take 1 tablet (25 mg total) by mouth daily.     modafinil 200 MG tablet  Commonly known as:  PROVIGIL  Take 200 mg by mouth 2 (two) times daily.     multivitamin with minerals Tabs tablet  Take 1 tablet by mouth daily.     tiZANidine 4 MG tablet  Commonly known as:  ZANAFLEX  Take 4 mg by mouth 4 (four) times daily.           Follow-up Information   Follow up with Anner Crete, MD In 1 month. (Call to make appt for catheter change)    Specialty:  Urology   Contact information:   [address] [place] [address] [phone]       Signed: Antony Haste 01/30/2013, 11:46 AM

## 2013-01-30 NOTE — Progress Notes (Signed)
Clinical Social Worker received phone call regarding patient need for transport at discharge.   Patient provided CSW with correct home address. CSW arranged ambulance transport for patient via [facility] for return home to his caregiver. Pt to make caregiver aware of patient return and is home to assist when patient arrives.   Providence Crosby, LCSWA Clinical Social Work [phone]

## 2013-01-30 NOTE — Progress Notes (Signed)
[facility] PROGRESS NOTE  [name] Mccoy. [name] Mccoy ZOX:[id number] DOB: Sep 06, 1960 DOA: 01/25/2013 PCP: [name] Moh, MD  Assessment/Plan: Hyponatremia -Resolved with IVF.  NSVT -There has been a Data processing manager with reporting of his ECHO. -Discussed with vascular tech. Per report, his EF appears normal. -Have asked vascular lab to call be if any true abnormalities once his ECHO is finalized, and if so will notify patient.  Believe it is ok to DC him home today. Discussed with Dr. Mena Mccoy.  Code Status: Full Family Communication: Patient only  Disposition Plan: Home today via ambulance.   Antibiotics: none  Subjective: No new issues. Anxious to go home.  Objective: Filed Vitals:   01/29/13 1900 01/29/13 2007 01/29/13 2203 01/30/13 0534  BP: 109/76 89/66 99/69  100/72  Pulse: 141 142 132 119  Temp:  99.3 F (37.4 C)  99.3 F (37.4 C)  TempSrc:  Oral  Oral  Resp: 20 18  18   Height:  6\' 3"  (1.905 m)    Weight:  58 kg (127 lb 13.9 oz)    SpO2: 99% 97%  98%    Intake/Output Summary (Last 24 hours) at 01/30/13 1307 Last data filed at 01/30/13 1300  Gross per 24 hour  Intake   1360 ml  Output   2050 ml  Net   -690 ml   Filed Weights   01/25/13 1718 01/29/13 0300 01/29/13 2007  Weight: 62.9 kg (138 lb 10.7 oz) 58.6 kg (129 lb 3 oz) 58 kg (127 lb 13.9 oz)    Exam:   General:  AA Ox3  Cardiovascular: Tachy, regular  Respiratory: CTA B  Abdomen: S/NT/ND/+BS  Extremities: Contractures   Neurologic:  CN intact  Data Reviewed: Basic Metabolic Panel:  Recent Labs Lab 01/25/13 1000 01/25/13 1635 01/27/13 0330 01/27/13 1000 01/28/13 0328  NA 135 132* 126* 129* 133*  K 3.9 3.9 4.9 4.1 4.1  CL 99 98 92* 93* 98  CO2 29 28 26 27 30   GLUCOSE 109* 124* 109* 135* 103*  BUN 14 15 18 15 20   CREATININE 0.68 0.68 0.63 0.69 0.68  CALCIUM 9.3 9.1 8.5 8.6 9.0   Liver Function Tests:  Recent Labs Lab 01/28/13 0328  AST 11  ALT 10  ALKPHOS 92  BILITOT 0.1*   PROT 7.9  ALBUMIN 2.3*   No results found for this basename: LIPASE, AMYLASE,  in the last 168 hours No results found for this basename: AMMONIA,  in the last 168 hours CBC:  Recent Labs Lab 01/25/13 1000 01/25/13 1635 01/26/13 0336 01/27/13 0330 01/28/13 0328  WBC 9.9  --   --   --   --   HGB 10.3* 9.7* 8.8* 8.1* 8.7*  HCT 33.2* 30.1* 28.3* 26.2* 27.9*  MCV 77.2*  --   --   --   --   PLT 724*  --   --   --   --    Cardiac Enzymes: No results found for this basename: CKTOTAL, CKMB, CKMBINDEX, TROPONINI,  in the last 168 hours BNP (last 3 results) No results found for this basename: PROBNP,  in the last 8760 hours CBG: No results found for this basename: GLUCAP,  in the last 168 hours  No results found for this or any previous visit (from the past 240 hour(s)).   Studies: No results found.  Scheduled Meds: . amantadine  100 mg Oral BID  . collagenase   Topical Daily  . dantrolene  25 mg Oral TID  . docusate sodium  100 mg  Oral BID  . ezetimibe  10 mg Oral QHS  . feeding supplement  237 mL Oral TID BM  . gabapentin  600 mg Oral QID  . Interferon Beta-1b  0.25 mg Subcutaneous QODAY  . metoprolol succinate  25 mg Oral Daily  . modafinil  200 mg Oral Custom  . multivitamin with minerals  1 tablet Oral Daily  . nutrition supplement  1 packet Oral BID PC  . tiZANidine  4 mg Oral QID   Continuous Infusions:    Principal Problem:   Right ureteral stone Active Problems:   Tachycardia   Hypokalemia   Anemia   Renal calculus, right   Hyponatremia   Microcytic anemia   NSVT (nonsustained ventricular tachycardia)    Time spent: 35 minutes    [name] Mccoy  [facility] Pager [phone]  If 7PM-7AM, please contact night-coverage at [email or web address], password [facility] 01/30/2013, 1:07 PM  LOS: 5 days

## 2013-01-31 ENCOUNTER — Ambulatory Visit: Payer: BC Managed Care – PPO | Admitting: Diagnostic Neuroimaging

## 2013-01-31 NOTE — Progress Notes (Signed)
Discharge summary sent to payer through [facility]  

## 2013-02-02 ENCOUNTER — Other Ambulatory Visit: Payer: Self-pay

## 2013-02-02 MED ORDER — INTERFERON BETA-1B 0.3 MG ~~LOC~~ KIT: 0.2500 mg | PACK | SUBCUTANEOUS | Status: DC

## 2013-02-18 ENCOUNTER — Ambulatory Visit: Payer: BC Managed Care – PPO | Admitting: Diagnostic Neuroimaging

## 2013-02-22 ENCOUNTER — Other Ambulatory Visit: Payer: Self-pay

## 2013-02-22 MED ORDER — INTERFERON BETA-1B 0.3 MG ~~LOC~~ KIT: 0.2500 mg | PACK | SUBCUTANEOUS | Status: DC

## 2013-02-23 ENCOUNTER — Other Ambulatory Visit: Payer: Self-pay | Admitting: Diagnostic Neuroimaging

## 2013-03-07 ENCOUNTER — Ambulatory Visit (INDEPENDENT_AMBULATORY_CARE_PROVIDER_SITE_OTHER): Payer: BC Managed Care – PPO | Admitting: Diagnostic Neuroimaging

## 2013-03-07 ENCOUNTER — Encounter: Payer: Self-pay | Admitting: Diagnostic Neuroimaging

## 2013-03-07 VITALS — BP 102/74 | HR 93

## 2013-03-07 DIAGNOSIS — G35 Multiple sclerosis: Secondary | ICD-10-CM

## 2013-03-07 MED ORDER — MODAFINIL 200 MG PO TABS: 200.0000 mg | ORAL_TABLET | Freq: Two times a day (BID) | ORAL | Status: DC

## 2013-03-07 NOTE — Patient Instructions (Signed)
Restart provigil.  Continue other medications.  Home physical therapy and stretching exercises.

## 2013-03-07 NOTE — Progress Notes (Signed)
[facility]  PATIENT: [name] Mccoy. Crow DOB: 02-27-1961  REFERRING CLINICIAN:  HISTORY FROM: patient, caregiver (2 ambulance transporters also in room) REASON FOR VISIT: follow up   HISTORICAL  CHIEF COMPLAINT:  Chief Complaint  Patient presents with  . Follow-up    MS    HISTORY OF PRESENT ILLNESS:   UPDATE 03/07/13: Since last visit with Dr. love in January 2014 patient has had multiple issues including some type of side effect related to Botox injection in March 2014 (resulting in bilateral hip pain and muscle spasm), kidney stone and urinary tract infection, hospitalization, diagnosed with bilateral hip fractures on x-ray. Patient's hip and hamstring contractures have continued to worsen. He has severe decubitus ulcers. He has gone through rehabilitation but now is living back at home. He is accompanied by a caregiver who has been working with him since the past 8-12 months. Patient is no longer able to sit in a wheelchair for transport. He has to be transported to office visits by ambulance and stretcher. Patient continues to take Betaseron. Occasionally he has flulike reactions.   PRIOR HPI (05/31/12, Dr. Sandria Manly): 52 -year-old left-handed African American single male with multiple sclerosis characterized by gait disorder beginning at age 62 in 57 when he presented with double vision. Intermittently he has taken steroids and as been on Betaseron therapy since 1999. He received Novantrone 11/2002, 03/2003, 05/2003, 06/2003, and 08/2003. He was on monthly IV SoluMedrol beginning in November 2008 and noticed improvement with that medication but I cannot objectively document it. 06/2011  he developed "sprains in his hips" with soreness in his groin region bilaterally. He had increased stiffness and inability to walk since then. He was in a wheelchair at home living by himself or in bed.He was not walking or standing. He was admitted to [facility] center March through June  2013 with decubitus ulcers and received PT. He has been home. 01/09/2012 and 03/16/2012, he developed left hand weakness with inability to write and I gave him 2 courses of IV Solu-Medrol followed by by mouth prednisone with improvement. 12/25/2011 CBC and CMP were normal except elevated alk phos. 01/27/2012  he had evidence of urinary tract infection. He comes in in a wheelchair. He cannot stand or take steps with assitance because of severe left greater than right leg weakness and hamstring contractions. He denies  bowel or bladder dysfunction  or Lhermittes sign. He has not had any falls. 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 His last bone density study 10/21/2010 showed low bone mass with osteopenia. His last MRI scan of the cervical spine with and without contrast 04/07/2007 was abnormal showing mild diffuse degenerative disc disease, with central canal stenosis at C4-5, diffuse neuroforaminal stenosis left greater than right at C5-6 and no cord lesions.   Last MRI of the brain 04/09/2007 showed bilateral lesions consistent with the diagnosis of MS.He last walked 06/2011.  He requires assistance in dressing, bathing,  taking care of his toileting needs and bowel habits.He  has not fallen.  He denies double vision, swallowing problems, slurred speech, or Lhermitte's sign He has home health aides from 10 a.m. to 6 p.m. and 10 p.m. to 2 a.m. He has not seen Dr. Cassandria Santee for evaluation of his painful hips.  X-rays 07/10/2011 showed no acute bony abnormality in the pelvis.  He had  Botox injection 04/29/2012 to  the lower extremities with minimal improvement  He is a candidate for motorized wheelchair because he cannot stand or walk.  He requires assistance in activities of daily living.  He cannot use a manual wheelchair because of left arm  weakness.  With a transfer board he can transfer to a motorized wheelchair.  He needs significant back support because of scoliosis.  A scooter would not suffice. He has mental and physical abilities to operate a wheelchair. He has not walked since 06/2011.  He only uses a  wheelchair.  He cannot  stand. A motorized wheelchair will allow transportation in his single-story home and improve his ability to do activities of daily living. He  can get through the doorways to the bathroom.  REVIEW OF SYSTEMS: Full 14 system review of systems performed and notable only for  weakness itching weight loss fatigue.  ALLERGIES: No Known Allergies  HOME MEDICATIONS: Outpatient Prescriptions Prior to Visit  Medication Sig Dispense Refill  . amantadine (SYMMETREL) 100 MG capsule Take 100 mg by mouth 2 (two) times daily.       Marland Kitchen BETASERON 0.3 MG KIT injection INJECT 0.25MG              SUBCUTANEOUSLY EVERY      OTHER DAY  14 kit  0  . collagenase (SANTYL) ointment Apply topically daily. Apply to Unstageable pressure ulcer on coccyx once daily.  Apply 1/8 inch thick and top with saline dressing.  Change daily and PRN for soiling or loosening of dressing x 21 days.  90 g  1  . dantrolene (DANTRIUM) 25 MG capsule Take 25 mg by mouth 3 (three) times daily.      Marland Kitchen ezetimibe (ZETIA) 10 MG tablet Take 10 mg by mouth at bedtime.       . feeding supplement (ENSURE COMPLETE) LIQD Take 237 mLs by mouth 3 (three) times daily between meals.      . gabapentin (NEURONTIN) 600 MG tablet Take 600 mg by mouth 4 (four) times daily.      Marland Kitchen HYDROcodone-acetaminophen (NORCO/VICODIN) 5-325 MG per tablet Take 2 tablets by mouth. Take 1 tablet every 8 hours as needed for pain(1-5) Take 2 tablets every 8 hours as needed for pain (6-10).      . metoprolol succinate (TOPROL-XL) 25 MG 24 hr tablet Take 1 tablet (25 mg total) by mouth daily.  30 tablet  11  . Multiple Vitamin (MULTIVITAMIN WITH MINERALS) TABS Take 1 tablet by mouth daily.        Insurance risk surveyor (BD SHARPS COLLECTOR) MISC USE AS DIRECTED  1 each  0  . tiZANidine (ZANAFLEX) 4 MG tablet Take 4 mg by mouth 4 (four) times daily.       . nutrition supplement (JUVEN) PACK Take 1 packet by mouth 2 (two) times daily after a meal.      . modafinil (PROVIGIL) 200 MG tablet Take 200 mg by mouth 2 (two) times daily.        No facility-administered medications prior to visit.    PAST MEDICAL HISTORY: Past Medical History  Diagnosis Date  . Multiple sclerosis   . Pressure ulcer, buttock(707.05)   . Pain in joint, pelvic region and thigh   . Spasm of muscle   . Unspecified vitamin D deficiency   . Disturbance of skin sensation   . Right ureteral stone 01/03/13  . Renal stones   . Neurogenic bladder     01/13/13 FOLEY CATH  .  Urinary retention with incomplete bladder emptying 01/13/13    FOLEY CATHETER  . Neuromuscular disorder     multiple schlorosis since 1983  . Hyponatremia 01/27/2013    PAST SURGICAL HISTORY: Past Surgical History  Procedure Laterality Date  . Wound debridement Right 10/15/2012    Procedure: DEBRIDMENT OF RIGHT HIP DECUBITUS AND SACRAL DECUBITUS;  Surgeon: Velora Heckler, MD;  Location: [facility];  Service: General;  Laterality: Right;  left lateral position, right hip  . Nephrolithotomy Right 01/25/2013    Procedure: RIGHT PERCUTANEOUS NEPHROLITHOTOMY ;  Surgeon: Anner Crete, MD;  Location: [facility];  Service: Urology;  Laterality: Right;    FAMILY HISTORY: Family History  Problem Relation Age of Onset  . Hypertension Mother   . Dementia Mother   . Hypertension Father   . Heart disease Father     SOCIAL HISTORY:  History   Social History  . Marital Status: Single    Spouse Name: N/A    Number of Children: 0  . Years of Education: college   Occupational History  . Disabled    Social History Main Topics  . Smoking status: Never Smoker   . Smokeless tobacco: Never Used  . Alcohol Use: No  . Drug Use: No  . Sexual Activity: No    Other Topics Concern  . Not on file   Social History Narrative   Patient is single and lives at home alone. Patient is disabled.   Caffeine Use: none     PHYSICAL EXAM  Filed Vitals:   03/07/13 1154  BP: 102/74  Pulse: 93    Not recorded    Cannot calculate BMI with a height equal to zero.  GENERAL EXAM: Patient is in no distress; MALAISE. ILL APPEARING. FRAIL AND CACHETIC.   CARDIOVASCULAR: Regular rate and rhythm, no murmurs, no carotid bruits  NEUROLOGIC: MENTAL STATUS: awake, alert, language fluent, comprehension intact, naming intact CRANIAL NERVE: pupils equal and reactive to light, visual fields full to confrontation, extraocular muscles intact, no nystagmus, facial sensation and strength symmetric, uvula midline, shoulder shrug symmetric, tongue midline. MOTOR: BUE ATROPHY (4/5). LUE INCOORDINATION. BILATERAL LEGS SEVERE HIP AND HAMSTRING CONTRACTURES WITH LEFTWARD ROTATION. RLE 1/5 PROX. LLE 0/5.  SENSORY: normal and symmetric to light touch COORDINATION: finger-nose-finger, fine finger movements normal REFLEXES: BUE 2, KNEES 2.  GAIT/STATION: LAYING ON TRANSPORT STRETCHING.   DIAGNOSTIC DATA (LABS, IMAGING, TESTING) - I reviewed patient records, labs, notes, testing and imaging myself where available.  Lab Results  Component Value Date   WBC 9.9 01/25/2013   HGB 8.7* 01/28/2013   HCT 27.9* 01/28/2013   MCV 77.2* 01/25/2013   PLT 724* 01/25/2013      Component Value Date/Time   NA 133* 01/28/2013 0328   K 4.1 01/28/2013 0328   CL 98 01/28/2013 0328   CO2 30 01/28/2013 0328   GLUCOSE 103* 01/28/2013 0328   BUN 20 01/28/2013 0328   CREATININE 0.68 01/28/2013 0328   CALCIUM 9.0 01/28/2013 0328   PROT 7.9 01/28/2013 0328   ALBUMIN 2.3* 01/28/2013 0328   AST 11 01/28/2013 0328   ALT 10 01/28/2013 0328   ALKPHOS 92 01/28/2013 0328   BILITOT 0.1* 01/28/2013 0328   GFRNONAA >90 01/28/2013 0328   GFRAA >90 01/28/2013 0328   Lab Results  Component Value Date   CHOL 110  01/28/2013   HDL 27* 01/28/2013   LDLCALC 71 01/28/2013   TRIG 61 01/28/2013   CHOLHDL 4.1 01/28/2013   Lab Results  Component Value Date   HGBA1C 5.6 07/31/2011   No results found for this basename: [id number]   Lab Results  Component Value Date   TSH 1.681 01/20/2013    ASSESSMENT AND PLAN  52 y.o. year old male here with multiple sclerosis, advanced. Now bedbound with severe BLE contractures.   PLAN: - continue betaseron - continue dantrolene, tizanidine, gabapentin - resume provigil - offered baclofen, but patient declined; he will try stretching exercises at home with caregiver and PT. He does not want anymore botox   Meds ordered this encounter  Medications  . modafinil (PROVIGIL) 200 MG tablet    Sig: Take 1 tablet (200 mg total) by mouth 2 (two) times daily.    Dispense:  60 tablet    Refill:  5    Return in about 6 months (around 09/05/2013) for with Edison Nasuti, MD 03/07/2013, 1:00 PM Certified in Neurology, Neurophysiology and Neuroimaging  [facility] [address] [place], [place] [address] [phone]

## 2013-03-24 ENCOUNTER — Other Ambulatory Visit: Payer: Self-pay

## 2013-03-25 ENCOUNTER — Other Ambulatory Visit: Payer: Self-pay | Admitting: Diagnostic Neuroimaging

## 2013-04-03 ENCOUNTER — Emergency Department (HOSPITAL_COMMUNITY)
Admission: EM | Admit: 2013-04-03 | Discharge: 2013-04-03 | Disposition: A | Discharge status: Home / Self Care | Payer: BC Managed Care – PPO | Attending: Emergency Medicine | Admitting: Emergency Medicine

## 2013-04-03 ENCOUNTER — Encounter (HOSPITAL_COMMUNITY): Payer: Self-pay | Admitting: Emergency Medicine

## 2013-04-03 DIAGNOSIS — Z862 Personal history of diseases of the blood and blood-forming organs and certain disorders involving the immune mechanism: Secondary | ICD-10-CM | POA: Insufficient documentation

## 2013-04-03 DIAGNOSIS — Z87442 Personal history of urinary calculi: Secondary | ICD-10-CM | POA: Insufficient documentation

## 2013-04-03 DIAGNOSIS — Z79899 Other long term (current) drug therapy: Secondary | ICD-10-CM | POA: Insufficient documentation

## 2013-04-03 DIAGNOSIS — Z8639 Personal history of other endocrine, nutritional and metabolic disease: Secondary | ICD-10-CM | POA: Insufficient documentation

## 2013-04-03 DIAGNOSIS — T839XXA Unspecified complication of genitourinary prosthetic device, implant and graft, initial encounter: Secondary | ICD-10-CM

## 2013-04-03 DIAGNOSIS — Z872 Personal history of diseases of the skin and subcutaneous tissue: Secondary | ICD-10-CM | POA: Insufficient documentation

## 2013-04-03 DIAGNOSIS — T83091A Other mechanical complication of indwelling urethral catheter, initial encounter: Secondary | ICD-10-CM | POA: Insufficient documentation

## 2013-04-03 DIAGNOSIS — Z87448 Personal history of other diseases of urinary system: Secondary | ICD-10-CM | POA: Insufficient documentation

## 2013-04-03 DIAGNOSIS — Z8669 Personal history of other diseases of the nervous system and sense organs: Secondary | ICD-10-CM | POA: Insufficient documentation

## 2013-04-03 DIAGNOSIS — Y846 Urinary catheterization as the cause of abnormal reaction of the patient, or of later complication, without mention of misadventure at the time of the procedure: Secondary | ICD-10-CM | POA: Insufficient documentation

## 2013-04-03 NOTE — ED Notes (Signed)
Discharge instructions explained to pt; pt verbalized understanding instructions.

## 2013-04-03 NOTE — ED Notes (Signed)
Bed: WA03 Expected date:  Expected time:  Means of arrival:  Comments: ems 

## 2013-04-03 NOTE — ED Provider Notes (Addendum)
I saw and evaluated the patient, reviewed the resident's note and I agree with the findings and plan.   .Face to face Exam:  General:  Awake HEENT:  Atraumatic Resp:  Normal effort Abd:  Nondistended Neuro: Chronic contractures secondary to long-standing multiple sclerosis    Nelia Shi, MD 04/03/13 [phone]

## 2013-04-03 NOTE — ED Provider Notes (Signed)
CSN: [id number]     Arrival date & time 04/03/13  1445 History   First MD Initiated Contact with Patient 04/03/13 1505     Chief Complaint  Patient presents with  . Urinary Incontinence   (Consider location/radiation/quality/duration/timing/severity/associated sxs/prior Treatment) HPI  52 y/o male with MS and subsequent neurogenic bladder managed with a chronic foley catheter. He states that his foley was changed at urology 1 week ago (monday) and it began to leak around the catheter at the urethra on Thursday (4 days ago). It was nt so bad on Friday but has become more severe since, now he feels it isn't even making it to the bag. He denies any other complaints at this time.   Past Medical History  Diagnosis Date  . Multiple sclerosis   . Pressure ulcer, buttock(707.05)   . Pain in joint, pelvic region and thigh   . Spasm of muscle   . Unspecified vitamin D deficiency   . Disturbance of skin sensation   . Right ureteral stone 01/03/13  . Renal stones   . Neurogenic bladder     01/13/13 FOLEY CATH  . Urinary retention with incomplete bladder emptying 01/13/13    FOLEY CATHETER  . Neuromuscular disorder     multiple schlorosis since 1983  . Hyponatremia 01/27/2013   Past Surgical History  Procedure Laterality Date  . Wound debridement Right 10/15/2012    Procedure: DEBRIDMENT OF RIGHT HIP DECUBITUS AND SACRAL DECUBITUS;  Surgeon: Velora Heckler, MD;  Location: [facility];  Service: General;  Laterality: Right;  left lateral position, right hip  . Nephrolithotomy Right 01/25/2013    Procedure: RIGHT PERCUTANEOUS NEPHROLITHOTOMY ;  Surgeon: Anner Crete, MD;  Location: [facility];  Service: Urology;  Laterality: Right;   Family History  Problem Relation Age of Onset  . Hypertension Mother   . Dementia Mother   . Hypertension Father   . Heart disease Father    History  Substance Use Topics  . Smoking status: Never Smoker   . Smokeless tobacco: Never Used  . Alcohol Use: No    Review  of Systems  Constitutional: Negative for fever, chills and diaphoresis.  HENT: Negative for sore throat.   Eyes: Negative for visual disturbance.  Respiratory: Negative for shortness of breath.   Gastrointestinal: Negative for nausea, vomiting, abdominal pain and diarrhea.  Genitourinary: Negative for flank pain.  Musculoskeletal: Negative for back pain.  Skin: Negative for rash.  Neurological: Negative for headaches.    Allergies  Review of patient's allergies indicates no known allergies.  Home Medications   Current Outpatient Rx  Name  Route  Sig  Dispense  Refill  . amantadine (SYMMETREL) 100 MG capsule   Oral   Take 100 mg by mouth 2 (two) times daily.          Marland Kitchen BETASERON 0.3 MG KIT injection      INJECT 0.25MG              SUBCUTANEOUSLY EVERY      OTHER DAY   14 kit   5   . collagenase (SANTYL) ointment   Topical   Apply topically daily. Apply to Unstageable pressure ulcer on coccyx once daily.  Apply 1/8 inch thick and top with saline dressing.  Change daily and PRN for soiling or loosening of dressing x 21 days.   90 g   1   . dantrolene (DANTRIUM) 25 MG capsule   Oral   Take 25 mg by mouth 3 (  three) times daily.         Marland Kitchen ezetimibe (ZETIA) 10 MG tablet   Oral   Take 10 mg by mouth at bedtime.          . feeding supplement (ENSURE COMPLETE) LIQD   Oral   Take 237 mLs by mouth 3 (three) times daily between meals.         . gabapentin (NEURONTIN) 600 MG tablet   Oral   Take 600 mg by mouth 4 (four) times daily.         Marland Kitchen HYDROcodone-acetaminophen (NORCO/VICODIN) 5-325 MG per tablet   Oral   Take 2 tablets by mouth. Take 1 tablet every 8 hours as needed for pain(1-5) Take 2 tablets every 8 hours as needed for pain (6-10).         . metoprolol succinate (TOPROL-XL) 25 MG 24 hr tablet   Oral   Take 1 tablet (25 mg total) by mouth daily.   30 tablet   11   . Multiple Vitamin (MULTIVITAMIN WITH MINERALS) TABS   Oral   Take 1 tablet by mouth  daily.         Marland Kitchen tiZANidine (ZANAFLEX) 4 MG tablet   Oral   Take 4 mg by mouth 4 (four) times daily.           BP 86/66  Pulse 116  Temp(Src) 98.4 F (36.9 C) (Oral)  Resp 16  Ht 6\' 3"  (1.905 m)  Wt 135 lb (61.236 kg)  BMI 16.87 kg/m2  SpO2 100% Physical Exam  Constitutional: He is oriented to person, place, and time. He appears well-developed and well-nourished. No distress.  HENT:  Head: Normocephalic and atraumatic.  Eyes: EOM are normal. Pupils are equal, round, and reactive to light.  Neck: Normal range of motion. Neck supple.  Cardiovascular: Normal rate, regular rhythm and normal heart sounds.   Pulmonary/Chest: Effort normal and breath sounds normal. No respiratory distress. He has no wheezes.  Abdominal: Soft. Bowel sounds are normal. There is no tenderness.  Musculoskeletal: He exhibits no edema.  BL LE flexed and atrophied  Neurological: He is alert and oriented to person, place, and time. He displays atrophy.  R foot with 2-3/5 strength, only slight movement of LLE.   Skin: Skin is warm and dry. He is not diaphoretic.  R hip with large clean dry intact bandage   Psychiatric: He has a normal mood and affect.    ED Course  Procedures (including critical care time) Labs Review Labs Reviewed - No data to display Imaging Review No results found.  EKG Interpretation   None       MDM   1. Foley catheter problem, initial encounter     52 y/o male here with leaking chronic foley used to manage neurogenic bladder. As it seems to be leaking around the urethra, We inserted a 20 french coude catheter (after usual foley couldn't pass the prostate) and got a return of urine.   Instructed patient to call and inform urology tomorrow and make appt for follow up.  Red flags reviewed.  Return for worsening symptoms.   Murtis Sink, MD [facility] Resident, PGY-2 04/03/2013, 4:34 PM      Elenora Gamma, MD 04/03/13 [phone]

## 2013-04-03 NOTE — ED Notes (Signed)
Per pt his foley was changed out on Monday and urine started leaking around foley on Thursday.

## 2013-04-13 ENCOUNTER — Encounter (HOSPITAL_COMMUNITY): Payer: Self-pay | Admitting: Emergency Medicine

## 2013-04-13 ENCOUNTER — Inpatient Hospital Stay (HOSPITAL_COMMUNITY)
Admission: EM | Admit: 2013-04-13 | Discharge: 2013-04-17 | DRG: 871 | Disposition: A | Discharge status: Home Health | Payer: BC Managed Care – PPO | Attending: Internal Medicine | Admitting: Internal Medicine

## 2013-04-13 DIAGNOSIS — L8990 Pressure ulcer of unspecified site, unspecified stage: Secondary | ICD-10-CM | POA: Diagnosis present

## 2013-04-13 DIAGNOSIS — Z681 Body mass index (BMI) 19 or less, adult: Secondary | ICD-10-CM

## 2013-04-13 DIAGNOSIS — L89209 Pressure ulcer of unspecified hip, unspecified stage: Secondary | ICD-10-CM | POA: Diagnosis present

## 2013-04-13 DIAGNOSIS — N319 Neuromuscular dysfunction of bladder, unspecified: Secondary | ICD-10-CM | POA: Diagnosis present

## 2013-04-13 DIAGNOSIS — Z87442 Personal history of urinary calculi: Secondary | ICD-10-CM

## 2013-04-13 DIAGNOSIS — E559 Vitamin D deficiency, unspecified: Secondary | ICD-10-CM | POA: Diagnosis present

## 2013-04-13 DIAGNOSIS — Z66 Do not resuscitate: Secondary | ICD-10-CM | POA: Diagnosis present

## 2013-04-13 DIAGNOSIS — E43 Unspecified severe protein-calorie malnutrition: Secondary | ICD-10-CM | POA: Diagnosis present

## 2013-04-13 DIAGNOSIS — L8995 Pressure ulcer of unspecified site, unstageable: Secondary | ICD-10-CM | POA: Diagnosis present

## 2013-04-13 DIAGNOSIS — L89109 Pressure ulcer of unspecified part of back, unspecified stage: Secondary | ICD-10-CM | POA: Diagnosis present

## 2013-04-13 DIAGNOSIS — A419 Sepsis, unspecified organism: Principal | ICD-10-CM | POA: Diagnosis present

## 2013-04-13 DIAGNOSIS — Z79899 Other long term (current) drug therapy: Secondary | ICD-10-CM

## 2013-04-13 DIAGNOSIS — T07XXXA Unspecified multiple injuries, initial encounter: Secondary | ICD-10-CM | POA: Diagnosis present

## 2013-04-13 DIAGNOSIS — Z8249 Family history of ischemic heart disease and other diseases of the circulatory system: Secondary | ICD-10-CM

## 2013-04-13 DIAGNOSIS — R339 Retention of urine, unspecified: Secondary | ICD-10-CM | POA: Diagnosis present

## 2013-04-13 DIAGNOSIS — N390 Urinary tract infection, site not specified: Secondary | ICD-10-CM | POA: Diagnosis present

## 2013-04-13 DIAGNOSIS — L89309 Pressure ulcer of unspecified buttock, unspecified stage: Secondary | ICD-10-CM | POA: Diagnosis present

## 2013-04-13 DIAGNOSIS — L8994 Pressure ulcer of unspecified site, stage 4: Secondary | ICD-10-CM | POA: Diagnosis present

## 2013-04-13 DIAGNOSIS — Z87440 Personal history of urinary (tract) infections: Secondary | ICD-10-CM

## 2013-04-13 DIAGNOSIS — E41 Nutritional marasmus: Secondary | ICD-10-CM | POA: Diagnosis present

## 2013-04-13 DIAGNOSIS — L8993 Pressure ulcer of unspecified site, stage 3: Secondary | ICD-10-CM | POA: Diagnosis present

## 2013-04-13 DIAGNOSIS — G35 Multiple sclerosis: Secondary | ICD-10-CM | POA: Diagnosis present

## 2013-04-13 HISTORY — DX: Do not resuscitate: Z66

## 2013-04-13 LAB — CBC
Hemoglobin: 11.9 g/dL — ABNORMAL LOW (ref 13.0–17.0)
RBC: 4.87 MIL/uL (ref 4.22–5.81)
WBC: 12.7 10*3/uL — ABNORMAL HIGH (ref 4.0–10.5)

## 2013-04-13 LAB — BASIC METABOLIC PANEL
CO2: 25 mEq/L (ref 19–32)
Chloride: 93 mEq/L — ABNORMAL LOW (ref 96–112)
GFR calc non Af Amer: >90 mL/min (ref >90)
Glucose, Bld: 112 mg/dL — ABNORMAL HIGH (ref 70–99)
Potassium: 4.5 mEq/L (ref 3.5–5.1)
Sodium: 129 mEq/L — ABNORMAL LOW (ref 135–145)

## 2013-04-13 LAB — URINALYSIS, ROUTINE W REFLEX MICROSCOPIC
Bilirubin Urine: NEGATIVE
Glucose, UA: NEGATIVE mg/dL
Ketones, ur: NEGATIVE mg/dL
Nitrite: NEGATIVE
Protein, ur: >300 mg/dL — AB
Specific Gravity, Urine: 1.019 (ref 1.005–1.030)
Urobilinogen, UA: 0.2 mg/dL (ref 0.0–1.0)
pH: 8 (ref 5.0–8.0)

## 2013-04-13 LAB — URINE MICROSCOPIC-ADD ON

## 2013-04-13 LAB — CG4 I-STAT (LACTIC ACID): Lactic Acid, Venous: 2.38 mmol/L — ABNORMAL HIGH (ref 0.5–2.2)

## 2013-04-13 MED ORDER — ACETAMINOPHEN 325 MG PO TABS
650.0000 mg | ORAL_TABLET | Freq: Four times a day (QID) | ORAL | Status: DC | PRN
  Administered 2013-04-14: 650 mg via ORAL
  Filled 2013-04-13: qty 2

## 2013-04-13 MED ORDER — GABAPENTIN 300 MG PO CAPS
600.0000 mg | ORAL_CAPSULE | Freq: Four times a day (QID) | ORAL | Status: DC
  Administered 2013-04-13 – 2013-04-17 (×15): 600 mg via ORAL
  Filled 2013-04-13 (×17): qty 2

## 2013-04-13 MED ORDER — HEPARIN SODIUM (PORCINE) 5000 UNIT/ML IJ SOLN
5000.0000 [IU] | Freq: Three times a day (TID) | INTRAMUSCULAR | Status: DC
  Administered 2013-04-13 – 2013-04-17 (×11): 5000 [IU] via SUBCUTANEOUS
  Filled 2013-04-13 (×14): qty 1

## 2013-04-13 MED ORDER — ENSURE COMPLETE PO LIQD
237.0000 mL | Freq: Three times a day (TID) | ORAL | Status: DC
  Administered 2013-04-14 – 2013-04-17 (×9): 237 mL via ORAL
  Filled 2013-04-13: qty 237

## 2013-04-13 MED ORDER — PIPERACILLIN-TAZOBACTAM 3.375 G IVPB
3.3750 g | Freq: Once | INTRAVENOUS | Status: AC
  Administered 2013-04-13: 3.375 g via INTRAVENOUS
  Filled 2013-04-13: qty 50

## 2013-04-13 MED ORDER — SODIUM CHLORIDE 0.9 % IV BOLUS (SEPSIS)
1000.0000 mL | Freq: Once | INTRAVENOUS | Status: AC
  Administered 2013-04-13: 1000 mL via INTRAVENOUS

## 2013-04-13 MED ORDER — ACETAMINOPHEN 500 MG PO TABS
1000.0000 mg | ORAL_TABLET | Freq: Once | ORAL | Status: AC
  Administered 2013-04-13: 1000 mg via ORAL
  Filled 2013-04-13: qty 2

## 2013-04-13 MED ORDER — ADULT MULTIVITAMIN W/MINERALS CH
1.0000 | ORAL_TABLET | Freq: Every day | ORAL | Status: DC
  Administered 2013-04-13 – 2013-04-17 (×5): 1 via ORAL
  Filled 2013-04-13 (×5): qty 1

## 2013-04-13 MED ORDER — TIZANIDINE HCL 4 MG PO TABS
4.0000 mg | ORAL_TABLET | Freq: Four times a day (QID) | ORAL | Status: DC
  Administered 2013-04-13 – 2013-04-17 (×15): 4 mg via ORAL
  Filled 2013-04-13 (×17): qty 1

## 2013-04-13 MED ORDER — SODIUM CHLORIDE 0.9 % IV SOLN
500.0000 mg | Freq: Three times a day (TID) | INTRAVENOUS | Status: DC
  Administered 2013-04-14 – 2013-04-15 (×4): 500 mg via INTRAVENOUS
  Filled 2013-04-13 (×6): qty 500

## 2013-04-13 MED ORDER — SODIUM CHLORIDE 0.9 % IJ SOLN
3.0000 mL | Freq: Two times a day (BID) | INTRAMUSCULAR | Status: DC
  Administered 2013-04-14: 21:00:00 via INTRAVENOUS
  Administered 2013-04-14: 3 mL via INTRAVENOUS

## 2013-04-13 MED ORDER — GABAPENTIN 600 MG PO TABS
600.0000 mg | ORAL_TABLET | Freq: Four times a day (QID) | ORAL | Status: DC
  Filled 2013-04-13: qty 1

## 2013-04-13 MED ORDER — EZETIMIBE 10 MG PO TABS
10.0000 mg | ORAL_TABLET | Freq: Every day | ORAL | Status: DC
  Administered 2013-04-13 – 2013-04-16 (×4): 10 mg via ORAL
  Filled 2013-04-13 (×5): qty 1

## 2013-04-13 MED ORDER — DANTROLENE SODIUM 25 MG PO CAPS
25.0000 mg | ORAL_CAPSULE | Freq: Three times a day (TID) | ORAL | Status: DC
  Administered 2013-04-13 – 2013-04-17 (×12): 25 mg via ORAL
  Filled 2013-04-13 (×14): qty 1

## 2013-04-13 MED ORDER — SODIUM CHLORIDE 0.9 % IV SOLN
INTRAVENOUS | Status: DC
  Administered 2013-04-13 – 2013-04-14 (×2): via INTRAVENOUS

## 2013-04-13 MED ORDER — AMANTADINE HCL 100 MG PO CAPS
100.0000 mg | ORAL_CAPSULE | Freq: Two times a day (BID) | ORAL | Status: DC
  Administered 2013-04-14 – 2013-04-17 (×8): 100 mg via ORAL
  Filled 2013-04-13 (×10): qty 1

## 2013-04-13 MED ORDER — COLLAGENASE 250 UNIT/GM EX OINT
TOPICAL_OINTMENT | Freq: Every day | CUTANEOUS | Status: DC
  Administered 2013-04-14: 1 via TOPICAL
  Administered 2013-04-14: 10:00:00 via TOPICAL
  Filled 2013-04-13: qty 30

## 2013-04-13 MED ORDER — INTERFERON BETA-1B 0.3 MG ~~LOC~~ KIT: 0.2500 mg | PACK | SUBCUTANEOUS | Status: DC

## 2013-04-13 MED ORDER — PIPERACILLIN-TAZOBACTAM 3.375 G IVPB
3.3750 g | Freq: Three times a day (TID) | INTRAVENOUS | Status: DC
  Administered 2013-04-14 – 2013-04-15 (×4): 3.375 g via INTRAVENOUS
  Filled 2013-04-13 (×5): qty 50

## 2013-04-13 MED ORDER — METOPROLOL SUCCINATE ER 25 MG PO TB24
25.0000 mg | ORAL_TABLET | Freq: Every day | ORAL | Status: DC
  Administered 2013-04-14 – 2013-04-17 (×4): 25 mg via ORAL
  Filled 2013-04-13 (×4): qty 1

## 2013-04-13 MED ORDER — VANCOMYCIN HCL IN DEXTROSE 1-5 GM/200ML-% IV SOLN
1000.0000 mg | Freq: Once | INTRAVENOUS | Status: AC
  Administered 2013-04-13: 1000 mg via INTRAVENOUS
  Filled 2013-04-13: qty 200

## 2013-04-13 MED ORDER — HYDROCODONE-ACETAMINOPHEN 5-325 MG PO TABS
2.0000 | ORAL_TABLET | Freq: Four times a day (QID) | ORAL | Status: DC | PRN
Start: 2013-04-13 — End: 2013-04-17
  Administered 2013-04-14 – 2013-04-16 (×5): 2 via ORAL
  Filled 2013-04-13 (×5): qty 2

## 2013-04-13 NOTE — ED Notes (Signed)
Bed: WA17 Expected date:  Expected time:  Means of arrival:  Comments: Ems- 52 yo M, fever, HR: 150

## 2013-04-13 NOTE — Progress Notes (Signed)
ANTIBIOTIC CONSULT NOTE - INITIAL  Pharmacy Consult for:  Vancomycin and Zosyn Indication:  Sepsis due to UTI vs. sacral decubitus   No Known Allergies  Patient Measurements: 04/03/13 -- Height 6'3"   Wt. 61.2 kg  Vital Signs: Temp: 98.2 F (36.8 C) (11/26 1947) Temp src: Oral (11/26 1947) BP: 106/77 mmHg (11/26 2000) Pulse Rate: 121 (11/26 1947)  Labs:  Recent Labs  04/13/13 1635  WBC 12.7*  HGB 11.9*  PLT 550*  CREATININE 0.79   The estimated creatinine clearance is 95.6 ml/min with SCr 0.79 using the Cockcroft-Gault equation.   Microbiology: 11/26 - blood and urine cultures pending  Medical History: Past Medical History  Diagnosis Date  . Multiple sclerosis   . Pressure ulcer, buttock(707.05)   . Pain in joint, pelvic region and thigh   . Spasm of muscle   . Unspecified vitamin D deficiency   . Disturbance of skin sensation   . Right ureteral stone 01/03/13  . Renal stones   . Neurogenic bladder     01/13/13 FOLEY CATH  . Urinary retention with incomplete bladder emptying 01/13/13    FOLEY CATHETER  . Neuromuscular disorder     multiple schlorosis since 1983  . Hyponatremia 01/27/2013  . DNR (do not resuscitate)     yellow copy    Medications:  Scheduled:  . amantadine  100 mg Oral BID  . collagenase   Topical Daily  . dantrolene  25 mg Oral TID  . ezetimibe  10 mg Oral QHS  . [START ON 04/14/2013] feeding supplement (ENSURE COMPLETE)  237 mL Oral TID BM  . gabapentin  600 mg Oral QID  . heparin  5,000 Units Subcutaneous Q8H  . [START ON 04/14/2013] Interferon Beta-1b  0.25 mg Subcutaneous QODAY  . [START ON 04/14/2013] metoprolol succinate  25 mg Oral Daily  . multivitamin with minerals  1 tablet Oral Daily  . sodium chloride  3 mL Intravenous Q12H  . tiZANidine  4 mg Oral QID   Assessment:  Asked to assist with Vancomycin and Zosyn therapy for this 52 year-old male with sepsis secondary to UTI and/or decubitus ulcers.  [name] Mccoy has an  indwelling urinary catheter due to a neurogenic bladder related to multiple sclerosis.  First doses of both antibiotics were given in the ED.  Goals of Therapy:   Vancomycin trough levels 15-20 mcg/ml  Eradication of infection  Plan:   Vancomycin 500 mg IV every 8 hours  Zosyn 3.375 grams IV every 8 hours, each dose infused over 4 hours.  Polo Riley R.Ph. 04/13/2013 9:37 PM

## 2013-04-13 NOTE — ED Notes (Signed)
Dr. Gardner at bedside 

## 2013-04-13 NOTE — ED Provider Notes (Signed)
CSN: [id number]     Arrival date & time 04/13/13  1547 History   First MD Initiated Contact with Patient 04/13/13 1608     Chief Complaint  Patient presents with  . Tachycardia  . Fever  . leaking foley    (Consider location/radiation/quality/duration/timing/severity/associated sxs/prior Treatment) HPI Pt with hx of MS, chronic indwelling foley presenting with leaking of urine.  He was seen in the ED on 11/16 and had foley replaced for similar symptoms.  He states this improved the leakage of urine and he did not arrange for outpatient urology followup.  Yesterday he began to have leakage of urine again.  Pt has also developed fever today of 100.1.  No vomiting, no abdominal pain.  There is still urine flowing through catheter, this has a white thick appearance.  He also c/o diffuse body aches.  There are no other associated systemic symptoms, there are no other alleviating or modifying factors.    Past Medical History  Diagnosis Date  . Multiple sclerosis   . Pressure ulcer, buttock(707.05)   . Pain in joint, pelvic region and thigh   . Spasm of muscle   . Unspecified vitamin D deficiency   . Disturbance of skin sensation   . Right ureteral stone 01/03/13  . Renal stones   . Neurogenic bladder     01/13/13 FOLEY CATH  . Urinary retention with incomplete bladder emptying 01/13/13    FOLEY CATHETER  . Neuromuscular disorder     multiple schlorosis since 1983  . Hyponatremia 01/27/2013  . DNR (do not resuscitate)     yellow copy   Past Surgical History  Procedure Laterality Date  . Wound debridement Right 10/15/2012    Procedure: DEBRIDMENT OF RIGHT HIP DECUBITUS AND SACRAL DECUBITUS;  Surgeon: Velora Heckler, MD;  Location: [facility];  Service: General;  Laterality: Right;  left lateral position, right hip  . Nephrolithotomy Right 01/25/2013    Procedure: RIGHT PERCUTANEOUS NEPHROLITHOTOMY ;  Surgeon: Anner Crete, MD;  Location: [facility];  Service: Urology;  Laterality: Right;  . Kidney  stone surgery      x 2   Family History  Problem Relation Age of Onset  . Hypertension Mother   . Dementia Mother   . Hypertension Father   . Heart disease Father    History  Substance Use Topics  . Smoking status: Never Smoker   . Smokeless tobacco: Never Used  . Alcohol Use: No    Review of Systems ROS reviewed and all otherwise negative except for mentioned in HPI  Allergies  Review of patient's allergies indicates no known allergies.  Home Medications   Current Outpatient Rx  Name  Route  Sig  Dispense  Refill  . amantadine (SYMMETREL) 100 MG capsule   Oral   Take 100 mg by mouth 2 (two) times daily.          Marland Kitchen BETASERON 0.3 MG KIT injection      INJECT 0.25MG              SUBCUTANEOUSLY EVERY      OTHER DAY   14 kit   5   . collagenase (SANTYL) ointment   Topical   Apply topically daily. Apply to Unstageable pressure ulcer on coccyx once daily.  Apply 1/8 inch thick and top with saline dressing.  Change daily and PRN for soiling or loosening of dressing x 21 days.   90 g   1   . dantrolene (DANTRIUM) 25 MG  capsule   Oral   Take 25 mg by mouth 3 (three) times daily.         Marland Kitchen ezetimibe (ZETIA) 10 MG tablet   Oral   Take 10 mg by mouth at bedtime.          . feeding supplement (ENSURE COMPLETE) LIQD   Oral   Take 237 mLs by mouth 3 (three) times daily between meals.         . gabapentin (NEURONTIN) 600 MG tablet   Oral   Take 600 mg by mouth 4 (four) times daily.         Marland Kitchen HYDROcodone-acetaminophen (NORCO/VICODIN) 5-325 MG per tablet   Oral   Take 2 tablets by mouth every 6 (six) hours as needed for moderate pain. Takes it about before home health care worker moves his legs. Also q6h prn         . metoprolol succinate (TOPROL-XL) 25 MG 24 hr tablet   Oral   Take 1 tablet (25 mg total) by mouth daily.   30 tablet   11   . Multiple Vitamin (MULTIVITAMIN WITH MINERALS) TABS   Oral   Take 1 tablet by mouth daily.         Marland Kitchen  tiZANidine (ZANAFLEX) 4 MG tablet   Oral   Take 4 mg by mouth 4 (four) times daily.           BP 89/55  Pulse 105  Temp(Src) 98.4 F (36.9 C) (Oral)  Resp 16  Ht 6\' 3"  (1.905 m)  Wt 134 lb 14.7 oz (61.2 kg)  BMI 16.86 kg/m2  SpO2 98% Vitals reviewed Physical Exam Physical Examination: General appearance - alert, chronically ill appearing, and in no distress Mental status - alert, oriented to person, place, and time Eyes - no conjunctival injection, no scleral icterus Mouth - mucous membranes moist, pharynx normal without lesions Chest - clear to auscultation, no wheezes, rales or rhonchi, symmetric air entry Heart - normal rate, regular rhythm, normal S1, S2, no murmurs, rubs, clicks or gallops Abdomen - soft, nontender, nondistended, no masses or organomegaly GU Male - no penile lesions or discharge, no testicular masses or tenderness, foley catheter in place Neurological - alert, oriented, normal speech, contractures of bilateral lower extremities, increased tone of upper extremities Extremities - muscle wasting of extremities, peripheral pulses normal, no pedal edema, no clubbing or cyanosis Skin - normal coloration and turgor, decubitus ulcer on right hip and sacral region  ED Course  Procedures (including critical care time)  8:09 PM d/w Dr. Julian Reil, [facility] for admission.   CRITICAL CARE Performed by: Ethelda Chick Total critical care time: 35 Critical care time was exclusive of separately billable procedures and treating other patients. Critical care was necessary to treat or prevent imminent or life-threatening deterioration. Critical care was time spent personally by me on the following activities: development of treatment plan with patient and/or surrogate as well as nursing, discussions with consultants, evaluation of patient's response to treatment, examination of patient, obtaining history from patient or surrogate, ordering and performing treatments and  interventions, ordering and review of laboratory studies, ordering and review of radiographic studies, pulse oximetry and re-evaluation of patient's condition. Labs Review Labs Reviewed  CBC - Abnormal; Notable for the following:    WBC 12.7 (*)    Hemoglobin 11.9 (*)    HCT 36.1 (*)    MCV 74.1 (*)    MCH 24.4 (*)    Platelets 550 (*)  All other components within normal limits  BASIC METABOLIC PANEL - Abnormal; Notable for the following:    Sodium 129 (*)    Chloride 93 (*)    Glucose, Bld 112 (*)    BUN 26 (*)    All other components within normal limits  URINALYSIS, ROUTINE W REFLEX MICROSCOPIC - Abnormal; Notable for the following:    Color, Urine RED (*)    APPearance TURBID (*)    Hgb urine dipstick LARGE (*)    Protein, ur >300 (*)    Leukocytes, UA LARGE (*)    All other components within normal limits  URINE MICROSCOPIC-ADD ON - Abnormal; Notable for the following:    Squamous Epithelial / LPF FIELD OBSCURED BY RBC'S (*)    Bacteria, UA FIELD OBSCURED BY RBC'S (*)    Casts FIELD OBSCURED BY RBC'S (*)    All other components within normal limits  CG4 I-STAT (LACTIC ACID) - Abnormal; Notable for the following:    Lactic Acid, Venous 2.38 (*)    All other components within normal limits  CULTURE, BLOOD (ROUTINE X 2)  CULTURE, BLOOD (ROUTINE X 2)  URINE CULTURE  CBC  BASIC METABOLIC PANEL   Imaging Review No results found.  EKG Interpretation    Date/Time:  Wednesday April 13 2013 16:06:03 EST Ventricular Rate:  142 PR Interval:  92 QRS Duration: 75 QT Interval:  285 QTC Calculation: 438 R Axis:   79 Text Interpretation:  sinus tachycardia No significant change since last tracing Confirmed by Karma Ganja  MD, Consetta Cosner [phone]) on 04/13/2013 4:43:39 PM            MDM   1. Multiple sclerosis   2. Neurogenic bladder   3. Sepsis   4. UTI (urinary tract infection)    Pt with chronic indwelling foley preseenting with leakage of urine, also with fever.  Pt  found to be tachycardic and hypotensive on arrival with fever.  ua c/w UTI, foley replaced and IV abx given.  Pt received IV fluid boluses as well.  BP responsive to fluids, HR decreased from 150s to 120s with fluids.  Pt admitted to inpatient service for further management.      Ethelda Chick, MD 04/13/13 [phone]

## 2013-04-13 NOTE — ED Notes (Signed)
Per [facility] pt presents with NAD. Pt reports leaking foley 03/28/2013 replaced at Dr. Isidore Moos. C/o of fever today 100.1 oral. Pt HR 150. Per Dr. Annabell Howells to to be evaluated here. Pt seen here between 03/28/2013 and today for replacement of foley and resulted in tearing of penis.

## 2013-04-13 NOTE — H&P (Addendum)
[facility] History and Physical  Brysun Eschmann. Blatt ZOX:[id number] DOB: 1961-01-08 DOA: 04/13/2013  Referring physician: ED PCP: Marletta Lor, NP   Chief Complaint: Fever, tachycardia  HPI: [name] Mccoy. Depascale is a 52 y.o. male who presents to the ED from Dr. Belva Crome office with a leaking foley, fever, and tachycardic up to 150.  His symptoms occur in the context of frequent recurrent UTIs due to a chronic indwelling foley catheter which he has due to neurogenic bladder secondary to multiple sclerosis.  The patient also has several deep decubitus ulcers on his back side as well.  In the ED patient was septic with tachycardia, fever, cloudy urine that became frankly bloody after foley change out.  BPs currently running with SBP between 85 and 110, but patient states that his SBP is normally only 90.  Tachycardia has improved with IVF and patient now down to ~120 (from 146 on arrival).  The tachypnea that he also had initially on presentation has resolved.  Review of Systems: 12 systems reviewed and otherwise negative.  Past Medical History  Diagnosis Date  . Multiple sclerosis   . Pressure ulcer, buttock(707.05)   . Pain in joint, pelvic region and thigh   . Spasm of muscle   . Unspecified vitamin D deficiency   . Disturbance of skin sensation   . Right ureteral stone 01/03/13  . Renal stones   . Neurogenic bladder     01/13/13 FOLEY CATH  . Urinary retention with incomplete bladder emptying 01/13/13    FOLEY CATHETER  . Neuromuscular disorder     multiple schlorosis since 1983  . Hyponatremia 01/27/2013  . DNR (do not resuscitate)     yellow copy   Past Surgical History  Procedure Laterality Date  . Wound debridement Right 10/15/2012    Procedure: DEBRIDMENT OF RIGHT HIP DECUBITUS AND SACRAL DECUBITUS;  Surgeon: Velora Heckler, MD;  Location: [facility];  Service: General;  Laterality: Right;  left lateral position, right hip  . Nephrolithotomy Right 01/25/2013    Procedure: RIGHT  PERCUTANEOUS NEPHROLITHOTOMY ;  Surgeon: Anner Crete, MD;  Location: [facility];  Service: Urology;  Laterality: Right;  . Kidney stone surgery      x 2   Social History:  reports that he has never smoked. He has never used smokeless tobacco. He reports that he does not drink alcohol or use illicit drugs.   No Known Allergies  Family History  Problem Relation Age of Onset  . Hypertension Mother   . Dementia Mother   . Hypertension Father   . Heart disease Father    Prior to Admission medications   Medication Sig Start Date End Date Taking? Authorizing Provider  amantadine (SYMMETREL) 100 MG capsule Take 100 mg by mouth 2 (two) times daily.    Yes Historical Provider, MD  BETASERON 0.3 MG KIT injection INJECT 0.25MG              SUBCUTANEOUSLY EVERY      OTHER DAY 03/25/13  Yes Suanne Marker, MD  collagenase (SANTYL) ointment Apply topically daily. Apply to Unstageable pressure ulcer on coccyx once daily.  Apply 1/8 inch thick and top with saline dressing.  Change daily and PRN for soiling or loosening of dressing x 21 days. 09/21/12  Yes Elease Etienne, MD  dantrolene (DANTRIUM) 25 MG capsule Take 25 mg by mouth 3 (three) times daily.   Yes Historical Provider, MD  ezetimibe (ZETIA) 10 MG tablet Take 10 mg by mouth  at bedtime.    Yes Historical Provider, MD  feeding supplement (ENSURE COMPLETE) LIQD Take 237 mLs by mouth 3 (three) times daily between meals. 09/21/12  Yes Elease Etienne, MD  gabapentin (NEURONTIN) 600 MG tablet Take 600 mg by mouth 4 (four) times daily.   Yes Historical Provider, MD  HYDROcodone-acetaminophen (NORCO/VICODIN) 5-325 MG per tablet Take 2 tablets by mouth every 6 (six) hours as needed for moderate pain. Takes it about before home health care worker moves his legs. Also q6h prn 10/25/12  Yes Tiffany L Reed, DO  metoprolol succinate (TOPROL-XL) 25 MG 24 hr tablet Take 1 tablet (25 mg total) by mouth daily. 01/28/13  Yes Bjorn Pippin, MD  Multiple Vitamin  (MULTIVITAMIN WITH MINERALS) TABS Take 1 tablet by mouth daily. 09/21/12  Yes Elease Etienne, MD  tiZANidine (ZANAFLEX) 4 MG tablet Take 4 mg by mouth 4 (four) times daily.    Yes Historical Provider, MD   Physical Exam: Filed Vitals:   04/13/13 2000  BP: 106/77  Pulse:   Temp:   Resp: 20    General:  NAD, resting comfortably in bed Eyes: PEERLA EOMI ENT: mucous membranes moist Neck: supple w/o JVD Cardiovascular: RRR w/o MRG Respiratory: CTA B Abdomen: soft, nt, nd, bs+ Skin: R hip with large C/D/I bandage Musculoskeletal: MAE, full ROM all 4 extremities Psychiatric: normal tone and affect Neurologic: AAOx3, chronic contractures due to MS in BLE   Labs on Admission:  Basic Metabolic Panel:  Recent Labs Lab 04/13/13 1635  NA 129*  K 4.5  CL 93*  CO2 25  GLUCOSE 112*  BUN 26*  CREATININE 0.79  CALCIUM 9.2   Liver Function Tests: No results found for this basename: AST, ALT, ALKPHOS, BILITOT, PROT, ALBUMIN,  in the last 168 hours No results found for this basename: LIPASE, AMYLASE,  in the last 168 hours No results found for this basename: AMMONIA,  in the last 168 hours CBC:  Recent Labs Lab 04/13/13 1635  WBC 12.7*  HGB 11.9*  HCT 36.1*  MCV 74.1*  PLT 550*   Cardiac Enzymes: No results found for this basename: CKTOTAL, CKMB, CKMBINDEX, TROPONINI,  in the last 168 hours  BNP (last 3 results) No results found for this basename: PROBNP,  in the last 8760 hours CBG: No results found for this basename: GLUCAP,  in the last 168 hours  Radiological Exams on Admission: No results found.  EKG: Independently reviewed.  Assessment/Plan Principal Problem:   Sepsis Active Problems:   Multiple sclerosis   Wounds, multiple   UTI (urinary tract infection)   Urinary retention with incomplete bladder emptying   1. Sepsis - secondary either to UTI and/or pressure ulcers.  Treating empirically with zosyn and vanc, blood and urine cultures pending, IVF bolus  in ED then 125 cc/hr.  Foley has already been changed out today.  Wound care to evaluate and treat for wounds.  BP is stable, no evidence of end organ damage at this time, patient clinically appears non-toxic, tachycardia improving, on tele monitor in SDU.  PRN tylenol for fever. 2. MS causing urinary retention with incomplete bladder emptying - patient uses chronic foley catheter.  Continue home beta-seron for MS as well as other home meds.    Code Status: Full Code (must indicate code status--if unknown or must be presumed, indicate so) Family Communication: No family in room (indicate person spoken with, if applicable, with phone number if by telephone) Disposition Plan: Admit to inpatient (indicate  anticipated LOS)  Time spent: 70 min  GARDNER, JARED M. [facility] Pager [phone]  If 7PM-7AM, please contact night-coverage [email or web address] Password [facility] 04/13/2013, 8:27 PM

## 2013-04-14 DIAGNOSIS — R339 Retention of urine, unspecified: Secondary | ICD-10-CM

## 2013-04-14 LAB — BASIC METABOLIC PANEL
BUN: 16 mg/dL (ref 6–23)
CO2: 21 mEq/L (ref 19–32)
Calcium: 8 mg/dL — ABNORMAL LOW (ref 8.4–10.5)
Chloride: 106 mEq/L (ref 96–112)
Creatinine, Ser: 0.57 mg/dL (ref 0.50–1.35)
GFR calc non Af Amer: >90 mL/min (ref >90)
Glucose, Bld: 88 mg/dL (ref 70–99)
Sodium: 135 mEq/L (ref 135–145)

## 2013-04-14 LAB — CBC
Hemoglobin: 9.8 g/dL — ABNORMAL LOW (ref 13.0–17.0)
MCH: 24.1 pg — ABNORMAL LOW (ref 26.0–34.0)
MCHC: 31.6 g/dL (ref 30.0–36.0)
MCV: 76.2 fL — ABNORMAL LOW (ref 78.0–100.0)
Platelets: 421 10*3/uL — ABNORMAL HIGH (ref 150–400)
RBC: 4.07 MIL/uL — ABNORMAL LOW (ref 4.22–5.81)

## 2013-04-14 LAB — MRSA PCR SCREENING: MRSA by PCR: POSITIVE — AB

## 2013-04-14 MED ORDER — SODIUM CHLORIDE 0.9 % IV BOLUS (SEPSIS)
1000.0000 mL | Freq: Once | INTRAVENOUS | Status: AC
  Administered 2013-04-14: 1000 mL via INTRAVENOUS

## 2013-04-14 MED ORDER — INTERFERON BETA-1B 0.3 MG ~~LOC~~ KIT
0.2500 mg | PACK | SUBCUTANEOUS | Status: DC
  Administered 2013-04-15: 0.25 mg via SUBCUTANEOUS

## 2013-04-14 MED ORDER — CHLORHEXIDINE GLUCONATE CLOTH 2 % EX PADS
6.0000 | MEDICATED_PAD | Freq: Every day | CUTANEOUS | Status: DC
  Administered 2013-04-14 – 2013-04-17 (×4): 6 via TOPICAL

## 2013-04-14 MED ORDER — MUPIROCIN 2 % EX OINT
1.0000 "application " | TOPICAL_OINTMENT | Freq: Two times a day (BID) | CUTANEOUS | Status: DC
  Administered 2013-04-14 – 2013-04-17 (×8): 1 via NASAL
  Filled 2013-04-14: qty 22

## 2013-04-14 NOTE — Progress Notes (Signed)
CARE MANAGEMENT NOTE 04/14/2013  Patient:  IDEN, STRIPLING K.   Account Number:  [id number]  Date Initiated:  04/14/2013  Documentation initiated by:  Kaetlyn Noa  Subjective/Objective Assessment:   pt with urosepsis, decubitus and poss sirs.     Action/Plan:   home when stable   Anticipated DC Date:  04/17/2013   Anticipated DC Plan:  HOME/SELF CARE  In-house referral  NA      DC Planning Services  NA      [facility] Choice  NA   Choice offered to / List presented to:  NA   DME arranged  NA      DME agency  NA     HH arranged  NA      HH agency  NA   Status of service:  In process, will continue to follow Medicare Important Message given?  NA - LOS <3 / Initial given by admissions (If response is "NO", the following Medicare IM given date fields will be blank) Date Medicare IM given:   Date Additional Medicare IM given:    Discharge Disposition:    Per UR Regulation:  Reviewed for med. necessity/level of care/duration of stay  If discussed at Long Length of Stay Meetings, dates discussed:    Comments:  [id number]/Kahealani Yankovich Lorrin Mais: Case management [phone] Chart reviewed and updated.  Next chart review due on [id number]. Needs for discharge at time of review:  none

## 2013-04-14 NOTE — Progress Notes (Signed)
[facility] PROGRESS NOTE  [name] Mccoy. [name] Mccoy:[id number] DOB: [birth year] DOA: 04/13/2013 PCP: Marletta Lor, NP  Assessment/Plan: 1.Sepsis - secondary either to UTI and/or pressure ulcers.  - Resolving on broad spectrum antibiotics and with IV fluid - Continue to monitor and step down  2. UTI - Continue broad-spectrum antibiotics - Awaiting urine culture results, we'll plan on tailoring antibiotic regimen once sensitivities are in  3. MS - Contributing to urinary retention with incomplete bladder emptying leading to #2 and #1. - Continue home regimen  Code Status: Stable Family Communication: No family at bedside Disposition Plan: Pending continued improvement in condition, awaiting urine culture and sensitivity results   Consultants:  None  Procedures:  None  Antibiotics:  On vancomycin and Zosyn since admission  HPI/Subjective: No acute issues reported to me. No new complaints from patient and he currently reports feeling better  Objective: Filed Vitals:   04/14/13 1300  BP: 93/54  Pulse: 110  Temp:   Resp: 14    Intake/Output Summary (Last 24 hours) at 04/14/13 1356 Last data filed at 04/14/13 0800  Gross per 24 hour  Intake   7050 ml  Output   2400 ml  Net   4650 ml   Filed Weights   04/13/13 2100 04/13/13 2320  Weight: 61.2 kg (134 lb 14.7 oz) 57.3 kg (126 lb 5.2 oz)    Exam:   General:  Pt in NAD, Alert and awake  Cardiovascular: RRR, no MRG  Respiratory: CTA BL, no wheezes no increased wob  Abdomen: soft, NT, ND  Musculoskeletal: generalized weakness, atrophied lower extremities    Data Reviewed: Basic Metabolic Panel:  Recent Labs Lab 04/13/13 1635 04/14/13 0330  NA 129* 135  K 4.5 3.9  CL 93* 106  CO2 25 21  GLUCOSE 112* 88  BUN 26* 16  CREATININE 0.79 0.57  CALCIUM 9.2 8.0*   Liver Function Tests: No results found for this basename: AST, ALT, ALKPHOS, BILITOT, PROT, ALBUMIN,  in the last 168 hours No results  found for this basename: LIPASE, AMYLASE,  in the last 168 hours No results found for this basename: AMMONIA,  in the last 168 hours CBC:  Recent Labs Lab 04/13/13 1635 04/14/13 0330  WBC 12.7* 9.7  HGB 11.9* 9.8*  HCT 36.1* 31.0*  MCV 74.1* 76.2*  PLT 550* 421*   Cardiac Enzymes: No results found for this basename: CKTOTAL, CKMB, CKMBINDEX, TROPONINI,  in the last 168 hours BNP (last 3 results) No results found for this basename: PROBNP,  in the last 8760 hours CBG: No results found for this basename: GLUCAP,  in the last 168 hours  Recent Results (from the past 240 hour(s))  CULTURE, BLOOD (ROUTINE X 2)     Status: None   Collection Time    04/13/13  4:35 PM      Result Value Range Status   Specimen Description BLOOD RIGHT HAND   Final   Special Requests BOTTLES DRAWN AEROBIC AND ANAEROBIC 4 ML   Final   Culture  Setup Time     Final   Value: 04/13/2013 21:06     Performed at [facility]   Culture     Final   Value:        BLOOD CULTURE RECEIVED NO GROWTH TO DATE CULTURE WILL BE HELD FOR 5 DAYS BEFORE ISSUING A FINAL NEGATIVE REPORT     Performed at [facility]   Report Status PENDING   Incomplete  CULTURE, BLOOD (ROUTINE  X 2)     Status: None   Collection Time    04/13/13  4:45 PM      Result Value Range Status   Specimen Description BLOOD LEFT HAND   Final   Special Requests BOTTLES DRAWN AEROBIC AND ANAEROBIC 4 ML   Final   Culture  Setup Time     Final   Value: 04/13/2013 21:06     Performed at [facility]   Culture     Final   Value:        BLOOD CULTURE RECEIVED NO GROWTH TO DATE CULTURE WILL BE HELD FOR 5 DAYS BEFORE ISSUING A FINAL NEGATIVE REPORT     Performed at [facility]   Report Status PENDING   Incomplete  URINE CULTURE     Status: None   Collection Time    04/13/13  6:03 PM      Result Value Range Status   Specimen Description URINE, CATHETERIZED   Final   Special Requests NONE   Final   Culture  Setup Time      Final   Value: 04/13/2013 22:38     Performed at [facility] Count     Final   Value: >=100,000 COLONIES/ML     Performed at [facility]   Culture     Final   Value: GRAM NEGATIVE RODS     Performed at [facility]   Report Status PENDING   Incomplete  MRSA PCR SCREENING     Status: Abnormal   Collection Time    04/14/13 12:02 AM      Result Value Range Status   MRSA by PCR POSITIVE (*) NEGATIVE Final   Comment:            The GeneXpert MRSA Assay (FDA     approved for NASAL specimens     only), is one component of a     comprehensive MRSA colonization     surveillance program. It is not     intended to diagnose MRSA     infection nor to guide or     monitor treatment for     MRSA infections.     RESULT CALLED TO, READ BACK BY AND VERIFIED WITHJannet Mantis [facility] RN [id number] 04/14/13 A NAVARRO     Studies: No results found.  Scheduled Meds: . amantadine  100 mg Oral BID  . Chlorhexidine Gluconate Cloth  6 each Topical Q0600  . collagenase   Topical Daily  . dantrolene  25 mg Oral TID  . ezetimibe  10 mg Oral QHS  . feeding supplement (ENSURE COMPLETE)  237 mL Oral TID BM  . gabapentin  600 mg Oral QID  . heparin  5,000 Units Subcutaneous Q8H  . [START ON 04/15/2013] Interferon Beta-1b  0.25 mg Subcutaneous QODAY  . metoprolol succinate  25 mg Oral Daily  . multivitamin with minerals  1 tablet Oral Daily  . mupirocin ointment  1 application Nasal BID  . piperacillin-tazobactam (ZOSYN)  IV  3.375 g Intravenous Q8H  . sodium chloride  3 mL Intravenous Q12H  . tiZANidine  4 mg Oral QID  . vancomycin  500 mg Intravenous Q8H   Continuous Infusions: . sodium chloride 125 mL/hr at 04/14/13 1478    Principal Problem:   Sepsis Active Problems:   Multiple sclerosis   Wounds, multiple   UTI (urinary tract infection)   Urinary retention with incomplete  bladder emptying    Time spent: > 45 minutes    [name] Mccoy  [facility] Pager [phone]. If 7PM-7AM, please contact night-coverage at [email or web address], password [facility] 04/14/2013, 1:56 PM  LOS: 1 day

## 2013-04-15 DIAGNOSIS — E559 Vitamin D deficiency, unspecified: Secondary | ICD-10-CM

## 2013-04-15 DIAGNOSIS — A419 Sepsis, unspecified organism: Secondary | ICD-10-CM

## 2013-04-15 DIAGNOSIS — N390 Urinary tract infection, site not specified: Secondary | ICD-10-CM

## 2013-04-15 LAB — URINE CULTURE: Colony Count: >=100000

## 2013-04-15 MED ORDER — DEXTROSE 5 % IV SOLN
1.0000 g | INTRAVENOUS | Status: DC
  Administered 2013-04-15 – 2013-04-16 (×2): 1 g via INTRAVENOUS
  Filled 2013-04-15 (×3): qty 10

## 2013-04-15 MED ORDER — SILVER NITRATE-POT NITRATE 75-25 % EX MISC
6.0000 | Freq: Every morning | CUTANEOUS | Status: DC
  Administered 2013-04-15 – 2013-04-17 (×3): 6 via TOPICAL
  Filled 2013-04-15 (×3): qty 6

## 2013-04-15 NOTE — Progress Notes (Signed)
INITIAL NUTRITION ASSESSMENT  Pt meets criteria for severe MALNUTRITION in the context of chronic illness as evidenced by severe muscle wasting and subcutaneous fat loss in clavicles and upper arms and 6.7% weight loss in the past month.  DOCUMENTATION CODES Per approved criteria  -Severe malnutrition in the context of chronic illness -Underweight   INTERVENTION: - Continue Ensure Complete TID - Educated pt on high calorie/protein diet - Continue daily multivitamin - Will continue to monitor   NUTRITION DIAGNOSIS: Increased nutrient needs related to multiple pressure ulcers as evidenced by WOC RN notes.   Goal: Pt to consume 100% of meals and supplements  Monitor:  Weights, labs, intake  Reason for Assessment: Malnutrition screening tool   52 y.o. 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 Admitted with leaking foley, tachycardia, and fever. Found to have sepsis.   Met with pt who reports eating well at home, 3 meals/day. Pt enjoys to cook. Pt drinking 2-3 Ensure Complete at home. Noted pt's weight down 9 pounds in the past 1.5 weeks. Pt reports weight loss started 3 years ago when his mother developed Alzheimer's disease. He weighed 190 pounds at that time, now at 126 pounds. Eating 100% of meals since admission. Pt takes daily multivitamin at home.   Pt with severe muscle wasting and subcutaneous fat loss in clavicles and upper arms with moderate wasting in hands, orbital, and temporal region.   Height: Ht Readings from Last 1 Encounters:  04/13/13 6\' 3"  (1.905 m)    Weight: Wt Readings from Last 1 Encounters:  04/13/13 126 lb 5.2 oz (57.3 kg)    Ideal Body Weight: 196 lb   % Ideal Body Weight: 64%  Wt Readings from Last 10 Encounters:  04/13/13  126 lb 5.2 oz (57.3 kg)  04/03/13 135 lb (61.236 kg)  01/29/13 127 lb 13.9 oz (58 kg)  01/29/13 127 lb 13.9 oz (58 kg)  01/20/13 123 lb 0.3 oz (55.8 kg)  01/08/13 139 lb 5.3 oz (63.2 kg)  11/09/12 134 lb (60.782 kg)  10/14/12 139 lb 5.3 oz (63.2 kg)  10/14/12 139 lb 5.3 oz (63.2 kg)  09/17/12 136 lb 11 oz (62 kg)    Usual Body Weight: 135 lb earlier this month  % Usual Body Weight: 93%  BMI:  Body mass index is 15.79 kg/(m^2). Underweight  Estimated Nutritional Needs: Kcal: 2300-2500 Protein: 115-140g Fluid: 2.3-2.5L/day  Skin: Wounds at right hip (Stage IV, chronic, non-healing), left upper buttock (Stage IV, chronic and non-healing) and left ischium Stage III, chronic and non-healing) per WOC RN notes   Diet Order: General  EDUCATION NEEDS: -Education needs addressed - educated pt on high calorie/protein diet, provided handouts with RD contact information    Intake/Output Summary (Last 24 hours) at 04/15/13 1144 Last data filed at 04/15/13 1100  Gross per 24 hour  Intake   4025 ml  Output   5350 ml  Net  -1325 ml    Last BM: 11/26  Labs:   Recent Labs Lab 04/13/13 1635 04/14/13 0330  NA 129* 135  K 4.5 3.9  CL 93* 106  CO2 25 21  BUN 26* 16  CREATININE 0.79 0.57  CALCIUM 9.2 8.0*  GLUCOSE 112* 88    CBG (last 3)  No results found for this basename:  GLUCAP,  in the last 72 hours  Scheduled Meds: . amantadine  100 mg Oral BID  . cefTRIAXone (ROCEPHIN)  IV  1 g Intravenous Q24H  . Chlorhexidine Gluconate Cloth  6 each Topical Q0600  . collagenase   Topical Daily  . dantrolene  25 mg Oral TID  . ezetimibe  10 mg Oral QHS  . feeding supplement (ENSURE COMPLETE)  237 mL Oral TID BM  . gabapentin  600 mg Oral QID  . heparin  5,000 Units Subcutaneous Q8H  . Interferon Beta-1b  0.25 mg Subcutaneous QODAY  . metoprolol succinate  25 mg Oral Daily  . multivitamin with minerals  1 tablet Oral Daily  . mupirocin ointment  1 application Nasal BID  .  silver nitrate applicators  6 Stick Topical q morning - 10a  . sodium chloride  3 mL Intravenous Q12H  . tiZANidine  4 mg Oral QID    Continuous Infusions:   Past Medical History  Diagnosis Date  . Multiple sclerosis   . Pressure ulcer, buttock(707.05)   . Pain in joint, pelvic region and thigh   . Spasm of muscle   . Unspecified vitamin D deficiency   . Disturbance of skin sensation   . Right ureteral stone 01/03/13  . Renal stones   . Neurogenic bladder     01/13/13 FOLEY CATH  . Urinary retention with incomplete bladder emptying 01/13/13    FOLEY CATHETER  . Neuromuscular disorder     multiple schlorosis since 1983  . Hyponatremia 01/27/2013  . DNR (do not resuscitate)     yellow copy    Past Surgical History  Procedure Laterality Date  . Wound debridement Right 10/15/2012    Procedure: DEBRIDMENT OF RIGHT HIP DECUBITUS AND SACRAL DECUBITUS;  Surgeon: Velora Heckler, MD;  Location: [facility];  Service: General;  Laterality: Right;  left lateral position, right hip  . Nephrolithotomy Right 01/25/2013    Procedure: RIGHT PERCUTANEOUS NEPHROLITHOTOMY ;  Surgeon: Anner Crete, MD;  Location: [facility];  Service: Urology;  Laterality: Right;  . Kidney stone surgery      x 2    Levon Hedger MS, RD, LDN [phone] Pager [phone] After Hours Pager

## 2013-04-15 NOTE — Consult Note (Signed)
WOC wound consult note Wound type: Pressure Ulcer POA: Yes/No Measurement: Wound bed: Drainage (amount, consistency, odor)  Periwound: Dressing procedure/placement/frequency: Conservative sharp wound debridement (CSWD performed at the bedside):    Reason for Consult:Wounds at right hip (Stage IV, chronic, non-healing), left upper buttock (Stage IV, chronic and non-healing) and left ischium Stage III, chronic and non-healing). Pt known to [facility] team from previous admissions. Heels intact, pt severely contracted. Pt spends most of his time in hospital bed with air mattress, limited time in his WC. Currently requires hoyer lift for transfers to the [facility]. Wound type: Stage IV and Stage III Pressure Ulcers  Pressure Ulcer POA: Yes x 3 Measurement:Right hip: 9.5cm x 11.5cm x 0.2cm. Upper left buttock: 3.5cm x 3cm x 0.2cm. Left ischium: 4.0 cm x cm x 3.0 cm x 0.5. Wound bed: Right hip: large intact reepithelialized area that does have one small 1.5x 1.5cm area of skin breakdown.  The majority of this wound is lower portion that is the area measured and documented above.  This area has hypergranulation above the skin level.  Both the left ischium and left upper buttock wounds are clean, pink, nongranular with epibole of the wound edges.  The left ischium does have some darkened areas from 7-9 o'clock  but does not appear necrotic.  All of these wounds are chronic, non healing and present for years. He is not an operative candidate per notes from ortho or plastics. Drainage (amount, consistency, odor)heavy, serous drainage from the right hip, wounds on the left have minimal drainage. None have foul drainage, or odor Periwound:Intact with evidence of previous healing.  Dressing procedure/placement/frequency: Will continue saline moist dressings for the wounds on the left, this is a conservative dressing for a chronic wound. Unless the epibole was addressed these wounds may never heal but they appear stable at  this time.  Will address the right hip wound with silver nitrate per the bedside nurse if he can tolerate this to the entire wound to address the hypergranulation tissue and add calcium alginate for exudate management which is the primary cause of the tissue overgrowth.  Change all dressings daily.  He is currently on Sport air mattress while in the ICU, however I noted the transfer order and will place order for mattress overlay to be placed on his bed (1411) prior to the ICU nurse transfer.  Discussed POC with patient and bedside nurse.  Re consult if needed, will not follow at this time. Thanks  [employer], CWOCN [phone])

## 2013-04-15 NOTE — Progress Notes (Signed)
[facility] PROGRESS NOTE  [name]. Timme WUJ:[id number] DOB: [birth year] DOA: 04/13/2013 PCP: Marletta Lor, NP  Assessment/Plan: 1.Sepsis - secondary either to UTI and/or pressure ulcers.  - Resolving on broad spectrum antibiotics and with IV fluid. Given improvement in blood pressures as well as oral intake we'll go ahead and saline lock today - Given improvement will transition to telemetry floor  2. UTI - Growing Proteus mirabilis - Will plan on placing on Rocephin - Continue IV antibiotics until fever free for 24 hours  3. MS - Contributing to urinary retention with incomplete bladder emptying leading to #2 and #1. - Continue home regimen  Code Status: Stable Family Communication: No family at bedside Disposition Plan: Pending continued improvement in condition, awaiting urine culture and sensitivity results   Consultants:  None  Procedures:  None  Antibiotics:  On vancomycin and Zosyn since admission >>>04/15/13  Place on Rocephin >>> 04/15/13  HPI/Subjective: Patient has no new complaints. No acute issues reported overnight.  Objective: Filed Vitals:   04/15/13 1017  BP: 143/66  Pulse: 98  Temp:   Resp:     Intake/Output Summary (Last 24 hours) at 04/15/13 1033 Last data filed at 04/15/13 0600  Gross per 24 hour  Intake   3325 ml  Output   4500 ml  Net  -1175 ml   Filed Weights   04/13/13 2100 04/13/13 2320  Weight: 61.2 kg (134 lb 14.7 oz) 57.3 kg (126 lb 5.2 oz)    Exam:   General:  Pt in NAD, Alert and awake  Cardiovascular: RRR, no MRG  Respiratory: CTA BL, no wheezes no increased wob  Abdomen: soft, NT, ND  Musculoskeletal: generalized weakness, atrophied lower extremities    Data Reviewed: Basic Metabolic Panel:  Recent Labs Lab 04/13/13 1635 04/14/13 0330  NA 129* 135  K 4.5 3.9  CL 93* 106  CO2 25 21  GLUCOSE 112* 88  BUN 26* 16  CREATININE 0.79 0.57  CALCIUM 9.2 8.0*   Liver Function Tests: No results  found for this basename: AST, ALT, ALKPHOS, BILITOT, PROT, ALBUMIN,  in the last 168 hours No results found for this basename: LIPASE, AMYLASE,  in the last 168 hours No results found for this basename: AMMONIA,  in the last 168 hours CBC:  Recent Labs Lab 04/13/13 1635 04/14/13 0330  WBC 12.7* 9.7  HGB 11.9* 9.8*  HCT 36.1* 31.0*  MCV 74.1* 76.2*  PLT 550* 421*   Cardiac Enzymes: No results found for this basename: CKTOTAL, CKMB, CKMBINDEX, TROPONINI,  in the last 168 hours BNP (last 3 results) No results found for this basename: PROBNP,  in the last 8760 hours CBG: No results found for this basename: GLUCAP,  in the last 168 hours  Recent Results (from the past 240 hour(s))  CULTURE, BLOOD (ROUTINE X 2)     Status: None   Collection Time    04/13/13  4:35 PM      Result Value Range Status   Specimen Description BLOOD RIGHT HAND   Final   Special Requests BOTTLES DRAWN AEROBIC AND ANAEROBIC 4 ML   Final   Culture  Setup Time     Final   Value: 04/13/2013 21:06     Performed at [facility]   Culture     Final   Value: GRAM POSITIVE COCCI     Note: Gram Stain Report Called to,Read Back By and Verified With: [facility] CONRAD ON 11.27.14 @ 1445 BY FERGK  Performed at [facility]   Report Status PENDING   Incomplete  CULTURE, BLOOD (ROUTINE X 2)     Status: None   Collection Time    04/13/13  4:45 PM      Result Value Range Status   Specimen Description BLOOD LEFT HAND   Final   Special Requests BOTTLES DRAWN AEROBIC AND ANAEROBIC 4 ML   Final   Culture  Setup Time     Final   Value: 04/13/2013 21:06     Performed at [facility]   Culture     Final   Value:        BLOOD CULTURE RECEIVED NO GROWTH TO DATE CULTURE WILL BE HELD FOR 5 DAYS BEFORE ISSUING A FINAL NEGATIVE REPORT     Performed at [facility]   Report Status PENDING   Incomplete  URINE CULTURE     Status: None   Collection Time    04/13/13  6:03 PM      Result Value Range  Status   Specimen Description URINE, CATHETERIZED   Final   Special Requests NONE   Final   Culture  Setup Time     Final   Value: 04/13/2013 22:38     Performed at [facility] Count     Final   Value: >=100,000 COLONIES/ML     Performed at [facility]   Culture     Final   Value: PROTEUS MIRABILIS     Performed at [facility]   Report Status 04/15/2013 FINAL   Final   Organism ID, Bacteria PROTEUS MIRABILIS   Final  MRSA PCR SCREENING     Status: Abnormal   Collection Time    04/14/13 12:02 AM      Result Value Range Status   MRSA by PCR POSITIVE (*) NEGATIVE Final   Comment:            The GeneXpert MRSA Assay (FDA     approved for NASAL specimens     only), is one component of a     comprehensive MRSA colonization     surveillance program. It is not     intended to diagnose MRSA     infection nor to guide or     monitor treatment for     MRSA infections.     RESULT CALLED TO, READ BACK BY AND VERIFIED WITHJannet Mantis [facility] RN [id number] 04/14/13 A NAVARRO     Studies: No results found.  Scheduled Meds: . amantadine  100 mg Oral BID  . Chlorhexidine Gluconate Cloth  6 each Topical Q0600  . collagenase   Topical Daily  . dantrolene  25 mg Oral TID  . ezetimibe  10 mg Oral QHS  . feeding supplement (ENSURE COMPLETE)  237 mL Oral TID BM  . gabapentin  600 mg Oral QID  . heparin  5,000 Units Subcutaneous Q8H  . Interferon Beta-1b  0.25 mg Subcutaneous QODAY  . metoprolol succinate  25 mg Oral Daily  . multivitamin with minerals  1 tablet Oral Daily  . mupirocin ointment  1 application Nasal BID  . piperacillin-tazobactam (ZOSYN)  IV  3.375 g Intravenous Q8H  . sodium chloride  3 mL Intravenous Q12H  . tiZANidine  4 mg Oral QID  . vancomycin  500 mg Intravenous Q8H   Continuous Infusions: . sodium chloride 125 mL/hr at 04/14/13 0859    Principal Problem:  Sepsis Active Problems:   Multiple sclerosis   Wounds, multiple   UTI  (urinary tract infection)   Urinary retention with incomplete bladder emptying    Time spent: > 45 minutes    Penny Pia  [facility] Pager [phone]. If 7PM-7AM, please contact night-coverage at [email or web address], password [facility] 04/15/2013, 10:33 AM  LOS: 2 days

## 2013-04-15 NOTE — Progress Notes (Signed)
ANTIBIOTIC CONSULT NOTE - INITIAL  Pharmacy Consult for Rocephin Indication: UTI  No Known Allergies  Patient Measurements: Height: 6\' 3"  (190.5 cm) Weight: 126 lb 5.2 oz (57.3 kg) IBW/kg (Calculated) : 84.5  Vital Signs: Temp: 98.3 F (36.8 C) (11/28 0800) Temp src: Oral (11/28 0800) BP: 115/77 mmHg (11/28 1030) Pulse Rate: 100 (11/28 1030) Intake/Output from previous day: 11/27 0701 - 11/28 0700 In: 3325 [I.V.:2875; IV Piggyback:450] Out: 4825 [Urine:4825] Intake/Output from this shift: Total I/O In: 125 [I.V.:125] Out: 850 [Urine:850]  Labs:  Recent Labs  04/13/13 1635 04/14/13 0330  WBC 12.7* 9.7  HGB 11.9* 9.8*  PLT 550* 421*  CREATININE 0.79 0.57   Estimated Creatinine Clearance: 89.5 ml/min (by C-G formula based on Cr of 0.57). No results found for this basename: VANCOTROUGH, VANCOPEAK, VANCORANDOM, GENTTROUGH, GENTPEAK, GENTRANDOM, TOBRATROUGH, TOBRAPEAK, TOBRARND, AMIKACINPEAK, AMIKACINTROU, AMIKACIN,  in the last 72 hours   Microbiology: Recent Results (from the past 720 hour(s))  CULTURE, BLOOD (ROUTINE X 2)     Status: None   Collection Time    04/13/13  4:35 PM      Result Value Range Status   Specimen Description BLOOD RIGHT HAND   Final   Special Requests BOTTLES DRAWN AEROBIC AND ANAEROBIC 4 ML   Final   Culture  Setup Time     Final   Value: 04/13/2013 21:06     Performed at [facility]   Culture     Final   Value: GRAM POSITIVE COCCI     Note: Gram Stain Report Called to,Read Back By and Verified With: [facility] CONRAD ON 11.27.14 @ 1445 BY FERGK     Performed at [facility]   Report Status PENDING   Incomplete  CULTURE, BLOOD (ROUTINE X 2)     Status: None   Collection Time    04/13/13  4:45 PM      Result Value Range Status   Specimen Description BLOOD LEFT HAND   Final   Special Requests BOTTLES DRAWN AEROBIC AND ANAEROBIC 4 ML   Final   Culture  Setup Time     Final   Value: 04/13/2013 21:06     Performed at [facility]   Culture     Final   Value:        BLOOD CULTURE RECEIVED NO GROWTH TO DATE CULTURE WILL BE HELD FOR 5 DAYS BEFORE ISSUING A FINAL NEGATIVE REPORT     Performed at [facility]   Report Status PENDING   Incomplete  URINE CULTURE     Status: None   Collection Time    04/13/13  6:03 PM      Result Value Range Status   Specimen Description URINE, CATHETERIZED   Final   Special Requests NONE   Final   Culture  Setup Time     Final   Value: 04/13/2013 22:38     Performed at [facility] Count     Final   Value: >=100,000 COLONIES/ML     Performed at [facility]   Culture     Final   Value: PROTEUS MIRABILIS     Performed at [facility]   Report Status 04/15/2013 FINAL   Final   Organism ID, Bacteria PROTEUS MIRABILIS   Final  MRSA PCR SCREENING     Status: Abnormal   Collection Time    04/14/13 12:02 AM      Result Value Range Status  MRSA by PCR POSITIVE (*) NEGATIVE Final   Comment:            The GeneXpert MRSA Assay (FDA     approved for NASAL specimens     only), is one component of a     comprehensive MRSA colonization     surveillance program. It is not     intended to diagnose MRSA     infection nor to guide or     monitor treatment for     MRSA infections.     RESULT CALLED TO, READ BACK BY AND VERIFIED WITHJannet Mantis [facility] RN 1610 04/14/13 A NAVARRO   Medical History: Past Medical History  Diagnosis Date  . Multiple sclerosis   . Pressure ulcer, buttock(707.05)   . Pain in joint, pelvic region and thigh   . Spasm of muscle   . Unspecified vitamin D deficiency   . Disturbance of skin sensation   . Right ureteral stone 01/03/13  . Renal stones   . Neurogenic bladder     01/13/13 FOLEY CATH  . Urinary retention with incomplete bladder emptying 01/13/13    FOLEY CATHETER  . Neuromuscular disorder     multiple schlorosis since 1983  . Hyponatremia 01/27/2013  . DNR (do not resuscitate)     yellow copy    Medications:  Anti-infectives   Start     Dose/Rate Route Frequency Ordered Stop   04/15/13 1200  cefTRIAXone (ROCEPHIN) 1 g in dextrose 5 % 50 mL IVPB     1 g 100 mL/hr over 30 Minutes Intravenous Every 24 hours 04/15/13 1054     04/14/13 0600  piperacillin-tazobactam (ZOSYN) IVPB 3.375 g  Status:  Discontinued     3.375 g 12.5 mL/hr over 240 Minutes Intravenous 3 times per day 04/13/13 2140 04/15/13 1041   04/13/13 1915  vancomycin (VANCOCIN) IVPB 1000 mg/200 mL premix     1,000 mg 200 mL/hr over 60 Minutes Intravenous  Once 04/13/13 1905 04/13/13 2104   04/13/13 1915  piperacillin-tazobactam (ZOSYN) IVPB 3.375 g     3.375 g 12.5 mL/hr over 240 Minutes Intravenous  Once 04/13/13 1905 04/14/13 0108   04/13/13 0400  vancomycin (VANCOCIN) 500 mg in sodium chloride 0.9 % 100 mL IVPB  Status:  Discontinued     500 mg 100 mL/hr over 60 Minutes Intravenous Every 8 hours 04/13/13 2140 04/15/13 1041     Assessment: 52 yoM with hx recurrent UTI and sacral decubitus, indwelling foley for neurogenic bladder due to MS. Completed 3 days Vancomycin/Zosyn; hx of resistant Providencia UTI, and Staph in LE ulcers.   Urine cx growing Proteus mirabilis: pan sens, including Rocephin  Blood cx 1/2 with GPC, afebrile and WBC wnl, no signs of sepsis (likely contaminant)  Begin Rocephin today, pharmacy asked to dose, this medication is not renally excreted, require no monitoring  Goal of Therapy:  Treatment of infection  Plan:   Rocephin 1gm q24, note no monitoring needed  Otho Bellows PharmD Pager [phone] 04/15/2013, 11:05 AM

## 2013-04-16 DIAGNOSIS — E43 Unspecified severe protein-calorie malnutrition: Secondary | ICD-10-CM | POA: Diagnosis present

## 2013-04-16 LAB — BASIC METABOLIC PANEL
CO2: 26 mEq/L (ref 19–32)
Chloride: 103 mEq/L (ref 96–112)
Glucose, Bld: 93 mg/dL (ref 70–99)
Potassium: 3.5 mEq/L (ref 3.5–5.1)
Sodium: 137 mEq/L (ref 135–145)

## 2013-04-16 LAB — CULTURE, BLOOD (ROUTINE X 2)

## 2013-04-16 NOTE — Progress Notes (Signed)
Pt states he would like to request [facility] for home health needs please.

## 2013-04-16 NOTE — Progress Notes (Addendum)
[facility] PROGRESS NOTE  [name]. Hasting WUJ:[id number] DOB: 04-10-1961 DOA: 04/13/2013 PCP: Marletta Lor, NP  Assessment/Plan: 1.Sepsis - Most likely 2ary to UTI growing proteus mirabilis - Resolved on IV antibiotics   2. UTI - Growing Proteus mirabilis - Will continue Rocephin for another 24 hours - Most likely transition to oral antibiotics 04/17/13  3. MS - Contributing to urinary retention with incomplete bladder emptying leading to #2 and #1. - Continue home regimen  Addendum 4. Severe malnutrition: - As evidenced by severe muscle wasting and subcutaneous fat loss in clavicles and upper arms with moderate wasting in hands, orbital, and temporal region and 6.7% weight loss in the past month - dietitian on board - Continue ensure complete tid and MV  Code Status: Stable Family Communication: No family at bedside Disposition Plan: Pending continued improvement in condition, awaiting urine culture and sensitivity results   Consultants:  None  Procedures:  None  Antibiotics:  On vancomycin and Zosyn since admission >>>04/15/13  Place on Rocephin >>> 04/15/13  HPI/Subjective: Patient has no new complaints. No acute issues reported overnight.  Objective: Filed Vitals:   04/16/13 0429  BP: 101/67  Pulse: 94  Temp: 98.9 F (37.2 C)  Resp: 18    Intake/Output Summary (Last 24 hours) at 04/16/13 1026 Last data filed at 04/16/13 0839  Gross per 24 hour  Intake    235 ml  Output   1600 ml  Net  -1365 ml   Filed Weights   04/13/13 2100 04/13/13 2320  Weight: 61.2 kg (134 lb 14.7 oz) 57.3 kg (126 lb 5.2 oz)    Exam:   General:  Pt in NAD, Alert and awake  Cardiovascular: RRR, no MRG  Respiratory: CTA BL, no wheezes no increased wob  Abdomen: soft, NT, ND  Musculoskeletal: generalized weakness, atrophied lower extremities    Data Reviewed: Basic Metabolic Panel:  Recent Labs Lab 04/13/13 1635 04/14/13 0330 04/16/13 0433  NA 129*  135 137  K 4.5 3.9 3.5  CL 93* 106 103  CO2 25 21 26   GLUCOSE 112* 88 93  BUN 26* 16 7  CREATININE 0.79 0.57 0.56  CALCIUM 9.2 8.0* 8.7   Liver Function Tests: No results found for this basename: AST, ALT, ALKPHOS, BILITOT, PROT, ALBUMIN,  in the last 168 hours No results found for this basename: LIPASE, AMYLASE,  in the last 168 hours No results found for this basename: AMMONIA,  in the last 168 hours CBC:  Recent Labs Lab 04/13/13 1635 04/14/13 0330  WBC 12.7* 9.7  HGB 11.9* 9.8*  HCT 36.1* 31.0*  MCV 74.1* 76.2*  PLT 550* 421*   Cardiac Enzymes: No results found for this basename: CKTOTAL, CKMB, CKMBINDEX, TROPONINI,  in the last 168 hours BNP (last 3 results) No results found for this basename: PROBNP,  in the last 8760 hours CBG: No results found for this basename: GLUCAP,  in the last 168 hours  Recent Results (from the past 240 hour(s))  CULTURE, BLOOD (ROUTINE X 2)     Status: None   Collection Time    04/13/13  4:35 PM      Result Value Range Status   Specimen Description BLOOD RIGHT HAND   Final   Special Requests BOTTLES DRAWN AEROBIC AND ANAEROBIC 4 ML   Final   Culture  Setup Time     Final   Value: 04/13/2013 21:06     Performed at [facility]  Final   Value: STAPHYLOCOCCUS SPECIES (COAGULASE NEGATIVE)     Note: THE SIGNIFICANCE OF ISOLATING THIS ORGANISM FROM A SINGLE SET OF BLOOD CULTURES WHEN MULTIPLE SETS ARE DRAWN IS UNCERTAIN. PLEASE NOTIFY THE MICROBIOLOGY DEPARTMENT WITHIN ONE WEEK IF SPECIATION AND SENSITIVITIES ARE REQUIRED.     Note: Gram Stain Report Called to,Read Back By and Verified With: [facility] CONRAD ON 11.27.14 @ 1445 BY FERGK     Performed at [facility]   Report Status 04/16/2013 FINAL   Final  CULTURE, BLOOD (ROUTINE X 2)     Status: None   Collection Time    04/13/13  4:45 PM      Result Value Range Status   Specimen Description BLOOD LEFT HAND   Final   Special Requests BOTTLES DRAWN AEROBIC AND  ANAEROBIC 4 ML   Final   Culture  Setup Time     Final   Value: 04/13/2013 21:06     Performed at [facility]   Culture     Final   Value:        BLOOD CULTURE RECEIVED NO GROWTH TO DATE CULTURE WILL BE HELD FOR 5 DAYS BEFORE ISSUING A FINAL NEGATIVE REPORT     Performed at [facility]   Report Status PENDING   Incomplete  URINE CULTURE     Status: None   Collection Time    04/13/13  6:03 PM      Result Value Range Status   Specimen Description URINE, CATHETERIZED   Final   Special Requests NONE   Final   Culture  Setup Time     Final   Value: 04/13/2013 22:38     Performed at [facility] Count     Final   Value: >=100,000 COLONIES/ML     Performed at [facility]   Culture     Final   Value: PROTEUS MIRABILIS     Performed at [facility]   Report Status 04/15/2013 FINAL   Final   Organism ID, Bacteria PROTEUS MIRABILIS   Final  MRSA PCR SCREENING     Status: Abnormal   Collection Time    04/14/13 12:02 AM      Result Value Range Status   MRSA by PCR POSITIVE (*) NEGATIVE Final   Comment:            The GeneXpert MRSA Assay (FDA     approved for NASAL specimens     only), is one component of a     comprehensive MRSA colonization     surveillance program. It is not     intended to diagnose MRSA     infection nor to guide or     monitor treatment for     MRSA infections.     RESULT CALLED TO, READ BACK BY AND VERIFIED WITHJannet Mantis [facility] RN 1610 04/14/13 A NAVARRO     Studies: No results found.  Scheduled Meds: . amantadine  100 mg Oral BID  . cefTRIAXone (ROCEPHIN)  IV  1 g Intravenous Q24H  . Chlorhexidine Gluconate Cloth  6 each Topical Q0600  . collagenase   Topical Daily  . dantrolene  25 mg Oral TID  . ezetimibe  10 mg Oral QHS  . feeding supplement (ENSURE COMPLETE)  237 mL Oral TID BM  . gabapentin  600 mg Oral QID  . heparin  5,000 Units Subcutaneous Q8H  . Interferon Beta-1b  0.25 mg Subcutaneous  QODAY  . metoprolol succinate  25 mg Oral Daily  . multivitamin with minerals  1 tablet Oral Daily  . mupirocin ointment  1 application Nasal BID  . silver nitrate applicators  6 Stick Topical q morning - 10a  . sodium chloride  3 mL Intravenous Q12H  . tiZANidine  4 mg Oral QID   Continuous Infusions:    Principal Problem:   Sepsis Active Problems:   Multiple sclerosis   Wounds, multiple   UTI (urinary tract infection)   Urinary retention with incomplete bladder emptying   Protein-calorie malnutrition, severe    Time spent: > 45 minutes    Penny Pia  [facility] Pager [phone]. If 7PM-7AM, please contact night-coverage at [email or web address], password [facility] 04/16/2013, 10:26 AM  LOS: 3 days

## 2013-04-17 DIAGNOSIS — N390 Urinary tract infection, site not specified: Secondary | ICD-10-CM | POA: Diagnosis present

## 2013-04-17 DIAGNOSIS — T148XXA Other injury of unspecified body region, initial encounter: Secondary | ICD-10-CM

## 2013-04-17 DIAGNOSIS — L89309 Pressure ulcer of unspecified buttock, unspecified stage: Secondary | ICD-10-CM

## 2013-04-17 DIAGNOSIS — E43 Unspecified severe protein-calorie malnutrition: Secondary | ICD-10-CM

## 2013-04-17 MED ORDER — CIPROFLOXACIN HCL 500 MG PO TABS: 500.0000 mg | ORAL_TABLET | Freq: Two times a day (BID) | ORAL | Status: DC

## 2013-04-17 MED ORDER — LEVOFLOXACIN 500 MG PO TABS
500.0000 mg | ORAL_TABLET | Freq: Every day | ORAL | Status: DC
  Administered 2013-04-17: 500 mg via ORAL
  Filled 2013-04-17: qty 1

## 2013-04-17 MED ORDER — LEVOFLOXACIN 500 MG PO TABS: 500.0000 mg | ORAL_TABLET | Freq: Every day | ORAL | Status: DC

## 2013-04-17 NOTE — Progress Notes (Signed)
Asked by RN to arrange for ambulance transport home, as Pt has MS and is total care.  CSW familiar with Pt and reminded him to alert his caregiver to his return later today.  Pt stated that he would do so and also stated that he has a way into the home, should she not be there yet when he arrives.  Ambulance transportation arranged.  Pt to be d/c'd.  Providence Crosby, LCSWA Clinical Social Work [phone]

## 2013-04-17 NOTE — Progress Notes (Signed)
   CARE MANAGEMENT NOTE 04/17/2013  Patient:  [name] Mccoy, [name] Mccoy.   Account Number:  [id number]  Date Initiated:  04/14/2013  Documentation initiated by:  DAVIS,RHONDA  Subjective/Objective Assessment:   pt with urosepsis, decubitus and poss sirs.     Action/Plan:   home when stable   Anticipated DC Date:  04/17/2013   Anticipated DC Plan:  HOME/SELF CARE      DC Planning Services  CM consult      [facility] Choice  HOME HEALTH   Choice offered to / List presented to:  C-1 Patient        HH arranged  HH-2 PT  HH-1 RN      [facility] agency  [facility]   Status of service:  Completed, signed off Medicare Important Message given?  NA - LOS <3 / Initial given by admissions (If response is "NO", the following Medicare IM given date fields will be blank) Date Medicare IM given:   Date Additional Medicare IM given:    Discharge Disposition:  HOME W HOME HEALTH SERVICES  Per UR Regulation:  Reviewed for med. necessity/level of care/duration of stay  If discussed at Long Length of Stay Meetings, dates discussed:    Comments:  04/17/2013 1320 NCM spoke to pt and offered choice for [facility]. Pt requested [facility]. Pt states he has a Caregiver, Winferd Humphrey that is in home with all the time. No DME needed at this time. Pt states he has equipment at home. Notified [facility] of referral for [facility].  Isidoro Donning RN CCM Case Mgmt phone [phone]  [phone] Earlene Plater, RN,BSN,CCM: Case management [phone] Chart reviewed and updated.  Next chart review due on [id number]. Needs for discharge at time of review:  none

## 2013-04-17 NOTE — Discharge Summary (Signed)
Physician Discharge Summary  [name] Mccoy. [name] Mccoy:[id number] DOB: 03/04/1961 DOA: 04/13/2013  PCP: Marletta Lor, NP  Admit date: 04/13/2013 Discharge date: 04/17/2013  Time spent: 35 minutes  Recommendations for Outpatient Follow-up:  Sepsis  - Most likely 2ary to UTI growing proteus mirabilis  - Resolved on IV antibiotics -Start levofloxacin 500 mg daily   UTI  - Growing Proteus mirabilis  - Will continue Rocephin for another 24 hours  - Most likely transition to oral antibiotics 04/17/13  -DC Rocephin, start levofloxacin 500 mg daily x3 days  MS  - Contributing to urinary retention with incomplete bladder emptying leading to #2 and #1.  - Continue home regimen  -Will change patient's Foley catheter prior to discharge. -Have consulted NCM Cathlean Cower [phone]  for home health  Severe malnutrition:  - As evidenced by severe muscle wasting and subcutaneous fat loss in clavicles and upper arms with moderate wasting in hands, orbital, and temporal region and 6.7% weight loss in the past month  - dietitian on board  - Continue ensure complete tid and MV    Multiple wounds  -Have set up home health care for patient to have at home care   Neurogenic bladder  -Frequent UTIs secondary to chronic indwelling Foley, patient now afebrile, normal WBC count will have Foley changed prior to discharge        Discharge Diagnoses:  Principal Problem:   Sepsis Active Problems:   Multiple sclerosis   Wounds, multiple   UTI (urinary tract infection)   Urinary retention with incomplete bladder emptying   Protein-calorie malnutrition, severe   Discharge Condition: Stable  Diet recommendation: Regular  Filed Weights   04/13/13 2100 04/13/13 2320  Weight: 61.2 kg (134 lb 14.7 oz) 57.3 kg (126 lb 5.2 oz)    History of present illness:  [name] Mccoy. [name] Mccoy is a 52 y.o.BM PMHx multiple sclerosis, protein calorie malnutrition severe, and indeed deficiency,Hx sepsis neurogenic bladder with  chronic indwelling Foley catheter, recurrent UTI. Presented to the ED from Dr. Belva Crome office with a leaking foley, fever, and tachycardic up to 150. His symptoms occur in the context of frequent recurrent UTIs due to a chronic indwelling foley catheter which he has due to neurogenic bladder secondary to multiple sclerosis. The patient also has several deep decubitus ulcers on his back side as well.  In the ED patient was septic with tachycardia, fever, cloudy urine that became frankly bloody after foley change out. BPs currently running with SBP between 85 and 110, but patient states that his SBP is normally only 90. Tachycardia has improved with IVF and patient now down to ~120 (from 146 on arrival). The tachypnea that he also had initially on presentation has resolved. 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 TODAY states negative CVA tenderness, negative N./V., negative bladder pain. States even when admitted for sepsis only symptoms were fever   Procedures:  MRSA by PCR (positive)  Urine culture 04/13/2013 (positive Proteus mirabilis)  Consultations:   Antibiotic On vancomycin and Zosyn since admission >>>04/15/13  Place on Rocephin >>> 04/15/13    Discharge Exam: Filed Vitals:   04/16/13 0429 04/16/13 1507 04/16/13 2057 04/17/13 0603  BP: 101/67 102/73 93/70 99/64   Pulse: 94 98 95 96  Temp: 98.9 F (37.2 C) 98.6 F (37 C) 98.2 F (36.8 C) 98.1 F (36.7 C)  TempSrc: Oral Oral Oral Oral  Resp: 18 18 18 18   Height:  Weight:      SpO2: 98% 100% 98% 98%    General: A./O. x4, NAD Cardiovascular: Regular rhythm and rate, negative murmurs rubs gallops.  Respiratory: Clear to auscultation bilateral Abdomen; negative abdominal pain, nondistended, plus bowel sounds. Negative CVA tenderness    Discharge Instructions   Future Appointments Provider Department Dept Phone   09/09/2013 8:30 AM  Ronal Fear, NP [facility] [phone]       Medication List    ASK your doctor about these medications       amantadine 100 MG capsule  Commonly known as:  SYMMETREL  Take 100 mg by mouth 2 (two) times daily.     BETASERON 0.3 MG Kit injection  Generic drug:  Interferon Beta-1b  INJECT 0.25MG              SUBCUTANEOUSLY EVERY      OTHER DAY     collagenase ointment  Commonly known as:  SANTYL  Apply topically daily. Apply to Unstageable pressure ulcer on coccyx once daily.  Apply 1/8 inch thick and top with saline dressing.  Change daily and PRN for soiling or loosening of dressing x 21 days.     dantrolene 25 MG capsule  Commonly known as:  DANTRIUM  Take 25 mg by mouth 3 (three) times daily.     ezetimibe 10 MG tablet  Commonly known as:  ZETIA  Take 10 mg by mouth at bedtime.     feeding supplement (ENSURE COMPLETE) Liqd  Take 237 mLs by mouth 3 (three) times daily between meals.     gabapentin 600 MG tablet  Commonly known as:  NEURONTIN  Take 600 mg by mouth 4 (four) times daily.     HYDROcodone-acetaminophen 5-325 MG per tablet  Commonly known as:  NORCO/VICODIN  Take 2 tablets by mouth every 6 (six) hours as needed for moderate pain. Takes it about before home health care worker moves his legs. Also q6h prn     metoprolol succinate 25 MG 24 hr tablet  Commonly known as:  TOPROL-XL  Take 1 tablet (25 mg total) by mouth daily.     multivitamin with minerals Tabs tablet  Take 1 tablet by mouth daily.     tiZANidine 4 MG tablet  Commonly known as:  ZANAFLEX  Take 4 mg by mouth 4 (four) times daily.       No Known Allergies    The results of significant diagnostics from this hospitalization (including imaging, microbiology, ancillary and laboratory) are listed below for reference.    Significant Diagnostic Studies: No results found.  Microbiology: Recent Results (from the past 240 hour(s))  CULTURE, BLOOD (ROUTINE X 2)      Status: None   Collection Time    04/13/13  4:35 PM      Result Value Range Status   Specimen Description BLOOD RIGHT HAND   Final   Special Requests BOTTLES DRAWN AEROBIC AND ANAEROBIC 4 ML   Final   Culture  Setup Time     Final   Value: 04/13/2013 21:06     Performed at [facility]   Culture     Final   Value: STAPHYLOCOCCUS SPECIES (COAGULASE NEGATIVE)     Note: THE SIGNIFICANCE OF ISOLATING THIS ORGANISM FROM A SINGLE SET OF BLOOD CULTURES WHEN MULTIPLE SETS ARE DRAWN IS UNCERTAIN. PLEASE NOTIFY THE MICROBIOLOGY DEPARTMENT WITHIN ONE WEEK IF SPECIATION AND SENSITIVITIES ARE REQUIRED.     Note: Gram Stain Report Called to,Read  Back By and Verified With: [facility] CONRAD ON 11.27.14 @ 1445 BY FERGK     Performed at [facility]   Report Status 04/16/2013 FINAL   Final  CULTURE, BLOOD (ROUTINE X 2)     Status: None   Collection Time    04/13/13  4:45 PM      Result Value Range Status   Specimen Description BLOOD LEFT HAND   Final   Special Requests BOTTLES DRAWN AEROBIC AND ANAEROBIC 4 ML   Final   Culture  Setup Time     Final   Value: 04/13/2013 21:06     Performed at [facility]   Culture     Final   Value:        BLOOD CULTURE RECEIVED NO GROWTH TO DATE CULTURE WILL BE HELD FOR 5 DAYS BEFORE ISSUING A FINAL NEGATIVE REPORT     Performed at [facility]   Report Status PENDING   Incomplete  URINE CULTURE     Status: None   Collection Time    04/13/13  6:03 PM      Result Value Range Status   Specimen Description URINE, CATHETERIZED   Final   Special Requests NONE   Final   Culture  Setup Time     Final   Value: 04/13/2013 22:38     Performed at [facility] Count     Final   Value: >=100,000 COLONIES/ML     Performed at [facility]   Culture     Final   Value: PROTEUS MIRABILIS     Performed at [facility]   Report Status 04/15/2013 FINAL   Final   Organism ID, Bacteria PROTEUS MIRABILIS   Final  MRSA  PCR SCREENING     Status: Abnormal   Collection Time    04/14/13 12:02 AM      Result Value Range Status   MRSA by PCR POSITIVE (*) NEGATIVE Final   Comment:            The GeneXpert MRSA Assay (FDA     approved for NASAL specimens     only), is one component of a     comprehensive MRSA colonization     surveillance program. It is not     intended to diagnose MRSA     infection nor to guide or     monitor treatment for     MRSA infections.     RESULT CALLED TO, READ BACK BY AND VERIFIED WITH:     Q [facility] RN 0211 04/14/13 A NAVARRO     Labs: Basic Metabolic Panel:  Recent Labs Lab 04/13/13 1635 04/14/13 0330 04/16/13 0433  NA 129* 135 137  K 4.5 3.9 3.5  CL 93* 106 103  CO2 25 21 26   GLUCOSE 112* 88 93  BUN 26* 16 7  CREATININE 0.79 0.57 0.56  CALCIUM 9.2 8.0* 8.7   Liver Function Tests: No results found for this basename: AST, ALT, ALKPHOS, BILITOT, PROT, ALBUMIN,  in the last 168 hours No results found for this basename: LIPASE, AMYLASE,  in the last 168 hours No results found for this basename: AMMONIA,  in the last 168 hours CBC:  Recent Labs Lab 04/13/13 1635 04/14/13 0330  WBC 12.7* 9.7  HGB 11.9* 9.8*  HCT 36.1* 31.0*  MCV 74.1* 76.2*  PLT 550* 421*   Cardiac Enzymes: No results found for this basename: CKTOTAL, CKMB, CKMBINDEX, TROPONINI,  in the last 168 hours BNP: BNP (last 3 results) No results found for this basename: PROBNP,  in the last 8760 hours CBG: No results found for this basename: GLUCAP,  in the last 168 hours     Signed:  Carolyne Littles, MD [facility] [phone] pager

## 2013-04-19 LAB — CULTURE, BLOOD (ROUTINE X 2): Culture: NO GROWTH

## 2013-04-25 ENCOUNTER — Other Ambulatory Visit: Payer: Self-pay | Admitting: Diagnostic Neuroimaging

## 2013-05-24 ENCOUNTER — Other Ambulatory Visit: Payer: Self-pay | Admitting: Diagnostic Neuroimaging

## 2013-07-13 ENCOUNTER — Encounter (HOSPITAL_COMMUNITY): Payer: Self-pay | Admitting: Emergency Medicine

## 2013-07-13 ENCOUNTER — Inpatient Hospital Stay (HOSPITAL_COMMUNITY)
Admission: EM | Admit: 2013-07-13 | Discharge: 2013-07-16 | DRG: 871 | Disposition: A | Discharge status: Home / Self Care | Payer: Medicaid Other | Attending: Internal Medicine | Admitting: Internal Medicine

## 2013-07-13 DIAGNOSIS — E559 Vitamin D deficiency, unspecified: Secondary | ICD-10-CM

## 2013-07-13 DIAGNOSIS — R627 Adult failure to thrive: Secondary | ICD-10-CM

## 2013-07-13 DIAGNOSIS — M62838 Other muscle spasm: Secondary | ICD-10-CM

## 2013-07-13 DIAGNOSIS — N12 Tubulo-interstitial nephritis, not specified as acute or chronic: Secondary | ICD-10-CM

## 2013-07-13 DIAGNOSIS — T83511A Infection and inflammatory reaction due to indwelling urethral catheter, initial encounter: Secondary | ICD-10-CM | POA: Diagnosis present

## 2013-07-13 DIAGNOSIS — G35 Multiple sclerosis: Secondary | ICD-10-CM

## 2013-07-13 DIAGNOSIS — E860 Dehydration: Secondary | ICD-10-CM

## 2013-07-13 DIAGNOSIS — A4159 Other Gram-negative sepsis: Principal | ICD-10-CM | POA: Diagnosis present

## 2013-07-13 DIAGNOSIS — I959 Hypotension, unspecified: Secondary | ICD-10-CM

## 2013-07-13 DIAGNOSIS — R6521 Severe sepsis with septic shock: Secondary | ICD-10-CM

## 2013-07-13 DIAGNOSIS — K5641 Fecal impaction: Secondary | ICD-10-CM

## 2013-07-13 DIAGNOSIS — N390 Urinary tract infection, site not specified: Secondary | ICD-10-CM

## 2013-07-13 DIAGNOSIS — Z87442 Personal history of urinary calculi: Secondary | ICD-10-CM

## 2013-07-13 DIAGNOSIS — R531 Weakness: Secondary | ICD-10-CM

## 2013-07-13 DIAGNOSIS — E871 Hypo-osmolality and hyponatremia: Secondary | ICD-10-CM

## 2013-07-13 DIAGNOSIS — N200 Calculus of kidney: Secondary | ICD-10-CM

## 2013-07-13 DIAGNOSIS — D509 Iron deficiency anemia, unspecified: Secondary | ICD-10-CM

## 2013-07-13 DIAGNOSIS — D649 Anemia, unspecified: Secondary | ICD-10-CM

## 2013-07-13 DIAGNOSIS — R000 Tachycardia, unspecified: Secondary | ICD-10-CM

## 2013-07-13 DIAGNOSIS — L8994 Pressure ulcer of unspecified site, stage 4: Secondary | ICD-10-CM | POA: Diagnosis present

## 2013-07-13 DIAGNOSIS — M25559 Pain in unspecified hip: Secondary | ICD-10-CM

## 2013-07-13 DIAGNOSIS — N201 Calculus of ureter: Secondary | ICD-10-CM

## 2013-07-13 DIAGNOSIS — I4729 Other ventricular tachycardia: Secondary | ICD-10-CM

## 2013-07-13 DIAGNOSIS — W19XXXA Unspecified fall, initial encounter: Secondary | ICD-10-CM

## 2013-07-13 DIAGNOSIS — R4182 Altered mental status, unspecified: Secondary | ICD-10-CM

## 2013-07-13 DIAGNOSIS — Z515 Encounter for palliative care: Secondary | ICD-10-CM

## 2013-07-13 DIAGNOSIS — I472 Ventricular tachycardia: Secondary | ICD-10-CM

## 2013-07-13 DIAGNOSIS — D75839 Thrombocytosis, unspecified: Secondary | ICD-10-CM

## 2013-07-13 DIAGNOSIS — E876 Hypokalemia: Secondary | ICD-10-CM

## 2013-07-13 DIAGNOSIS — Y846 Urinary catheterization as the cause of abnormal reaction of the patient, or of later complication, without mention of misadventure at the time of the procedure: Secondary | ICD-10-CM | POA: Diagnosis present

## 2013-07-13 DIAGNOSIS — Z66 Do not resuscitate: Secondary | ICD-10-CM | POA: Diagnosis present

## 2013-07-13 DIAGNOSIS — L89109 Pressure ulcer of unspecified part of back, unspecified stage: Secondary | ICD-10-CM | POA: Diagnosis present

## 2013-07-13 DIAGNOSIS — Z466 Encounter for fitting and adjustment of urinary device: Secondary | ICD-10-CM

## 2013-07-13 DIAGNOSIS — E43 Unspecified severe protein-calorie malnutrition: Secondary | ICD-10-CM

## 2013-07-13 DIAGNOSIS — T07XXXA Unspecified multiple injuries, initial encounter: Secondary | ICD-10-CM

## 2013-07-13 DIAGNOSIS — R6510 Systemic inflammatory response syndrome (SIRS) of non-infectious origin without acute organ dysfunction: Secondary | ICD-10-CM | POA: Diagnosis present

## 2013-07-13 DIAGNOSIS — D473 Essential (hemorrhagic) thrombocythemia: Secondary | ICD-10-CM

## 2013-07-13 DIAGNOSIS — M6282 Rhabdomyolysis: Secondary | ICD-10-CM

## 2013-07-13 DIAGNOSIS — N319 Neuromuscular dysfunction of bladder, unspecified: Secondary | ICD-10-CM

## 2013-07-13 DIAGNOSIS — D72829 Elevated white blood cell count, unspecified: Secondary | ICD-10-CM

## 2013-07-13 DIAGNOSIS — R339 Retention of urine, unspecified: Secondary | ICD-10-CM

## 2013-07-13 DIAGNOSIS — A419 Sepsis, unspecified organism: Secondary | ICD-10-CM

## 2013-07-13 DIAGNOSIS — R509 Fever, unspecified: Secondary | ICD-10-CM

## 2013-07-13 DIAGNOSIS — L89309 Pressure ulcer of unspecified buttock, unspecified stage: Secondary | ICD-10-CM

## 2013-07-13 DIAGNOSIS — Z79899 Other long term (current) drug therapy: Secondary | ICD-10-CM

## 2013-07-13 LAB — URINE MICROSCOPIC-ADD ON

## 2013-07-13 LAB — URINALYSIS, ROUTINE W REFLEX MICROSCOPIC
BILIRUBIN URINE: NEGATIVE
Glucose, UA: NEGATIVE mg/dL
Ketones, ur: NEGATIVE mg/dL
NITRITE: NEGATIVE
PH: 8 (ref 5.0–8.0)
Protein, ur: >300 mg/dL — AB
SPECIFIC GRAVITY, URINE: 1.012 (ref 1.005–1.030)
Urobilinogen, UA: 0.2 mg/dL (ref 0.0–1.0)

## 2013-07-13 MED ORDER — SODIUM CHLORIDE 0.9 % IV BOLUS (SEPSIS)
1000.0000 mL | Freq: Once | INTRAVENOUS | Status: AC
  Administered 2013-07-13: 1000 mL via INTRAVENOUS

## 2013-07-13 NOTE — ED Provider Notes (Signed)
CSN: [id number]     Arrival date & time 07/13/13  1744 History   First MD Initiated Contact with Patient 07/13/13 1853     Chief Complaint  Patient presents with  . Urinary Retention     (Consider location/radiation/quality/duration/timing/severity/associated sxs/prior Treatment) HPI Comments: 53 y/o male with MS and subsequent neurogenic bladder managed with a chronic foley catheter. He states that his foley was changed at urology 1 week ago (monday) by Dr. Roni Bread. Patient states he is unable to urinate since yesterday morning. He states he has the urge to urinate but is not passing them through his Foley. He endorses some mild discomfort in the suprapubic region but denies any abdominal pain. He also denies any fevers, nausea, vomiting, diarrhea, hematuria. And denies any recent illnesses. Patient denies any previous issue with urinary retention with Foley in place for only one week.    Past Medical History  Diagnosis Date  . Multiple sclerosis   . Pressure ulcer, buttock(707.05)   . Pain in joint, pelvic region and thigh   . Spasm of muscle   . Unspecified vitamin D deficiency   . Disturbance of skin sensation   . Right ureteral stone 01/03/13  . Renal stones   . Neurogenic bladder     01/13/13 FOLEY CATH  . Urinary retention with incomplete bladder emptying 01/13/13    FOLEY CATHETER  . Neuromuscular disorder     multiple schlorosis since 1983  . Hyponatremia 01/27/2013  . DNR (do not resuscitate)     yellow copy   Past Surgical History  Procedure Laterality Date  . Wound debridement Right 10/15/2012    Procedure: DEBRIDMENT OF RIGHT HIP DECUBITUS AND SACRAL DECUBITUS;  Surgeon: Earnstine Regal, MD;  Location: [facility];  Service: General;  Laterality: Right;  left lateral position, right hip  . Nephrolithotomy Right 01/25/2013    Procedure: RIGHT PERCUTANEOUS NEPHROLITHOTOMY ;  Surgeon: Malka So, MD;  Location: [facility];  Service: Urology;  Laterality: Right;  . Kidney stone surgery       x 2   Family History  Problem Relation Age of Onset  . Hypertension Mother   . Dementia Mother   . Hypertension Father   . Heart disease Father    History  Substance Use Topics  . Smoking status: Never Smoker   . Smokeless tobacco: Never Used  . Alcohol Use: No    Review of Systems  Constitutional: Negative for fever.  Respiratory: Negative for shortness of breath.   Gastrointestinal: Positive for abdominal distention (suprapubic). Negative for nausea, vomiting and diarrhea.  Genitourinary: Positive for decreased urine volume.  All other systems reviewed and are negative.      Allergies  Review of patient's allergies indicates no known allergies.  Home Medications   Current Outpatient Rx  Name  Route  Sig  Dispense  Refill  . amantadine (SYMMETREL) 100 MG capsule   Oral   Take 100 mg by mouth 2 (two) times daily.          Marland Kitchen BETASERON 0.3 MG KIT injection      INJECT 0.$RemoveB'25MG'dlXbCDvk$              SUBCUTANEOUSLY EVERY      OTHER DAY   14 kit   6   . collagenase (SANTYL) ointment   Topical   Apply topically daily. Apply to Unstageable pressure ulcer on coccyx once daily.  Apply 1/8 inch thick and top with saline dressing.  Change daily and PRN for  soiling or loosening of dressing x 21 days.   90 g   1   . dantrolene (DANTRIUM) 25 MG capsule   Oral   Take 25 mg by mouth 3 (three) times daily.         Marland Kitchen ezetimibe (ZETIA) 10 MG tablet   Oral   Take 10 mg by mouth at bedtime.          . feeding supplement (ENSURE COMPLETE) LIQD   Oral   Take 237 mLs by mouth 3 (three) times daily between meals.         . gabapentin (NEURONTIN) 600 MG tablet   Oral   Take 600 mg by mouth 4 (four) times daily.         Marland Kitchen HYDROcodone-acetaminophen (NORCO/VICODIN) 5-325 MG per tablet   Oral   Take 2 tablets by mouth every 6 (six) hours as needed for moderate pain. 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 Also q6h prn         . levofloxacin  (LEVAQUIN) 500 MG tablet   Oral   Take 1 tablet (500 mg total) by mouth daily.   3 tablet   0   . metoprolol succinate (TOPROL-XL) 25 MG 24 hr tablet   Oral   Take 1 tablet (25 mg total) by mouth daily.   30 tablet   11   . modafinil (PROVIGIL) 200 MG tablet   Oral   Take 200 mg by mouth 2 (two) times daily.         . Multiple Vitamin (MULTIVITAMIN WITH MINERALS) TABS   Oral   Take 1 tablet by mouth daily.         Marland Kitchen oxybutynin (DITROPAN) 5 MG tablet   Oral   Take 5 mg by mouth 3 (three) times daily.         . OxyCODONE (OXYCONTIN) 10 mg T12A 12 hr tablet   Oral   Take 10 mg by mouth every 12 (twelve) hours.         Marland Kitchen tiZANidine (ZANAFLEX) 4 MG tablet   Oral   Take 4 mg by mouth 4 (four) times daily.          . valACYclovir (VALTREX) 1000 MG tablet   Oral   Take 1,000 mg by mouth every 8 (eight) hours. For 7 days          BP 121/83  Pulse 129  Temp(Src) 98.7 F (37.1 C) (Oral)  Resp 18  SpO2 97% Physical Exam  Constitutional: He is oriented to person, place, and time. He appears well-developed and well-nourished. No distress.  HENT:  Head: Normocephalic and atraumatic.  Right Ear: External ear normal.  Left Ear: External ear normal.  Nose: Nose normal.  Mouth/Throat: Oropharynx is clear and moist.  Eyes: Conjunctivae are normal.  Neck: Normal range of motion. Neck supple.  Cardiovascular: Regular rhythm and normal heart sounds.  Tachycardia present.   Pulmonary/Chest: Effort normal and breath sounds normal. No respiratory distress.  Abdominal: Soft. Bowel sounds are normal. He exhibits no distension. There is no tenderness. There is no rebound and no guarding.  Musculoskeletal: Normal range of motion.  BL LE flexed and atrophied    Neurological: He is alert and oriented to person, place, and time. He displays atrophy. GCS eye subscore is 4. GCS verbal subscore is 5. GCS motor subscore is 6.  Skin: Skin is warm and dry. He is not diaphoretic.   Psychiatric: He has  a normal mood and affect.    ED Course  Procedures (including critical care time) Medications  sodium chloride 0.9 % bolus 1,000 mL (not administered)  sodium chloride 0.9 % bolus 1,000 mL (1,000 mLs Intravenous New Bag/Given 07/13/13 2203)  sodium chloride 0.9 % bolus 1,000 mL (1,000 mLs Intravenous New Bag/Given 07/13/13 2337)    Labs Review Labs Reviewed  URINALYSIS, ROUTINE W REFLEX MICROSCOPIC - Abnormal; Notable for the following:    APPearance TURBID (*)    Hgb urine dipstick SMALL (*)    Protein, ur >300 (*)    Leukocytes, UA LARGE (*)    All other components within normal limits  URINE MICROSCOPIC-ADD ON - Abnormal; Notable for the following:    Casts HYALINE CASTS (*)    All other components within normal limits  URINE CULTURE  CBC WITH DIFFERENTIAL  BASIC METABOLIC PANEL   Imaging Review No results found.  EKG Interpretation   None       MDM   Final diagnoses:  None    Filed Vitals:   07/13/13 2034  BP: 121/83  Pulse: 129  Temp:   Resp: 18    Afebrile, NAD, non-toxic appearing, AAOx4. Patient presenting for urinary retention with Foley catheter in place. Patient has no other complaints. He had no complaints of chest pain, shortness of breath, nausea, vomiting, hematuria. Patient tachycardic. Lungs clear to auscultation bilaterally. Abdomen soft, nontender, nondistended. Patient appears dehydrated with dry mucous membranes Patient only with moderate amounts of urine a bladder scan. Nurse able to flush Foley catheter with good urine flow. Patient given 2 L of IV fluids and continues to remain tachycardic. He continues to deny any shortness of breath, chest pain. After rehydration able to obtain some screening laboratories. Patient states he has had issues with tachycardia in the past, but cannot comment further on this. CBC returned, patient with leukocytosis, UTI. Will obtain BC and lactic acid, and start IV abx with admission. Labs  pending, will sign out to [employer], PA-C. Patient d/w with Dr. Tawnya Crook, agrees with plan.    Harlow Mares, PA-C 07/14/13 [phone]

## 2013-07-13 NOTE — ED Notes (Signed)
He states he has had his foley catheter in since last Mon.  He is here today with c/o "no urine output since yesterday morning. (It is a #18 fr. Foley.)

## 2013-07-13 NOTE — ED Notes (Signed)
Bed: WA16 Expected date:  Expected time:  Means of arrival:  Comments: EMS 

## 2013-07-13 NOTE — ED Notes (Signed)
Per EMS: Pt had new foley placed 07/04/13.  Was working until 07/11/13.  Very little urine in collection bag.  Has tried to flush it with no success.  Cloudy urine about 150 cc in bag.  No decrease in fluid intake.

## 2013-07-13 NOTE — ED Notes (Signed)
Foley cath irrigated with 30 ml of NS--- immediate return of yellow urine obtained; approximately 700 ml of urine output was noted.

## 2013-07-13 NOTE — ED Notes (Signed)
[name] Mccoy, [place] notified of difficulty with lab collection.

## 2013-07-13 NOTE — ED Notes (Signed)
Unsuccessfully attempted to obtain blood for labs x2.RN made aware

## 2013-07-14 ENCOUNTER — Encounter (HOSPITAL_COMMUNITY): Payer: Self-pay | Admitting: Emergency Medicine

## 2013-07-14 ENCOUNTER — Emergency Department (HOSPITAL_COMMUNITY): Payer: Medicaid Other

## 2013-07-14 DIAGNOSIS — N12 Tubulo-interstitial nephritis, not specified as acute or chronic: Secondary | ICD-10-CM | POA: Diagnosis present

## 2013-07-14 DIAGNOSIS — G35 Multiple sclerosis: Secondary | ICD-10-CM

## 2013-07-14 DIAGNOSIS — N319 Neuromuscular dysfunction of bladder, unspecified: Secondary | ICD-10-CM

## 2013-07-14 DIAGNOSIS — N390 Urinary tract infection, site not specified: Secondary | ICD-10-CM | POA: Diagnosis present

## 2013-07-14 LAB — CBC WITH DIFFERENTIAL/PLATELET
BASOS ABS: 0.1 10*3/uL (ref 0.0–0.1)
Basophils Relative: 1 % (ref 0–1)
EOS PCT: 1 % (ref 0–5)
Eosinophils Absolute: 0.1 10*3/uL (ref 0.0–0.7)
HCT: 40.7 % (ref 39.0–52.0)
Hemoglobin: 13.6 g/dL (ref 13.0–17.0)
Lymphocytes Relative: 9 % — ABNORMAL LOW (ref 12–46)
Lymphs Abs: 1.3 10*3/uL (ref 0.7–4.0)
MCH: 25.9 pg — AB (ref 26.0–34.0)
MCHC: 33.4 g/dL (ref 30.0–36.0)
MCV: 77.4 fL — AB (ref 78.0–100.0)
MONO ABS: 1.5 10*3/uL — AB (ref 0.1–1.0)
Monocytes Relative: 10 % (ref 3–12)
Neutro Abs: 12.2 10*3/uL — ABNORMAL HIGH (ref 1.7–7.7)
Neutrophils Relative %: 80 % — ABNORMAL HIGH (ref 43–77)
Platelets: 316 10*3/uL (ref 150–400)
RBC: 5.26 MIL/uL (ref 4.22–5.81)
RDW: 16 % — AB (ref 11.5–15.5)
WBC: 15.2 10*3/uL — ABNORMAL HIGH (ref 4.0–10.5)

## 2013-07-14 LAB — BASIC METABOLIC PANEL
BUN: 19 mg/dL (ref 6–23)
CALCIUM: 8.7 mg/dL (ref 8.4–10.5)
CO2: 19 mEq/L (ref 19–32)
CREATININE: 0.76 mg/dL (ref 0.50–1.35)
Chloride: 97 mEq/L (ref 96–112)
GFR calc Af Amer: >90 mL/min (ref >90)
Glucose, Bld: 98 mg/dL (ref 70–99)
Potassium: 4.4 mEq/L (ref 3.7–5.3)
SODIUM: 133 meq/L — AB (ref 137–147)

## 2013-07-14 LAB — LACTIC ACID, PLASMA: Lactic Acid, Venous: 1.2 mmol/L (ref 0.5–2.2)

## 2013-07-14 MED ORDER — DEXTROSE 5 % IV SOLN
1.0000 g | Freq: Once | INTRAVENOUS | Status: AC
  Administered 2013-07-14: 1 g via INTRAVENOUS
  Filled 2013-07-14: qty 10

## 2013-07-14 MED ORDER — OXYCODONE HCL ER 10 MG PO T12A
10.0000 mg | EXTENDED_RELEASE_TABLET | Freq: Two times a day (BID) | ORAL | Status: DC
  Administered 2013-07-14 – 2013-07-16 (×5): 10 mg via ORAL
  Filled 2013-07-14 (×5): qty 1

## 2013-07-14 MED ORDER — SODIUM CHLORIDE 0.9 % IV BOLUS (SEPSIS)
1000.0000 mL | Freq: Once | INTRAVENOUS | Status: AC
  Administered 2013-07-14: 1000 mL via INTRAVENOUS

## 2013-07-14 MED ORDER — ENOXAPARIN SODIUM 40 MG/0.4ML ~~LOC~~ SOLN
40.0000 mg | SUBCUTANEOUS | Status: DC
  Administered 2013-07-14 – 2013-07-16 (×3): 40 mg via SUBCUTANEOUS
  Filled 2013-07-14 (×3): qty 0.4

## 2013-07-14 MED ORDER — MODAFINIL 200 MG PO TABS
200.0000 mg | ORAL_TABLET | Freq: Two times a day (BID) | ORAL | Status: DC
  Administered 2013-07-14 – 2013-07-16 (×5): 200 mg via ORAL
  Filled 2013-07-14 (×5): qty 1

## 2013-07-14 MED ORDER — TIZANIDINE HCL 4 MG PO TABS
4.0000 mg | ORAL_TABLET | Freq: Four times a day (QID) | ORAL | Status: DC
  Administered 2013-07-14 – 2013-07-16 (×11): 4 mg via ORAL
  Filled 2013-07-14 (×12): qty 1

## 2013-07-14 MED ORDER — ENSURE COMPLETE PO LIQD
237.0000 mL | Freq: Three times a day (TID) | ORAL | Status: DC
  Administered 2013-07-14 – 2013-07-16 (×7): 237 mL via ORAL
  Filled 2013-07-14 (×3): qty 237

## 2013-07-14 MED ORDER — HYDROCODONE-ACETAMINOPHEN 5-325 MG PO TABS
2.0000 | ORAL_TABLET | Freq: Four times a day (QID) | ORAL | Status: DC | PRN
  Administered 2013-07-15: 2 via ORAL
  Filled 2013-07-14 (×2): qty 2

## 2013-07-14 MED ORDER — SODIUM CHLORIDE 0.9 % IV SOLN
INTRAVENOUS | Status: AC
  Administered 2013-07-14: 03:00:00 via INTRAVENOUS

## 2013-07-14 MED ORDER — ADULT MULTIVITAMIN W/MINERALS CH
1.0000 | ORAL_TABLET | Freq: Every day | ORAL | Status: DC
  Administered 2013-07-14 – 2013-07-16 (×3): 1 via ORAL
  Filled 2013-07-14 (×3): qty 1

## 2013-07-14 MED ORDER — AMANTADINE HCL 100 MG PO CAPS
100.0000 mg | ORAL_CAPSULE | Freq: Two times a day (BID) | ORAL | Status: DC
  Administered 2013-07-14 – 2013-07-16 (×5): 100 mg via ORAL
  Filled 2013-07-14 (×7): qty 1

## 2013-07-14 MED ORDER — DANTROLENE SODIUM 25 MG PO CAPS
25.0000 mg | ORAL_CAPSULE | Freq: Three times a day (TID) | ORAL | Status: DC
  Administered 2013-07-14 – 2013-07-16 (×8): 25 mg via ORAL
  Filled 2013-07-14 (×9): qty 1

## 2013-07-14 MED ORDER — GABAPENTIN 300 MG PO CAPS
600.0000 mg | ORAL_CAPSULE | Freq: Four times a day (QID) | ORAL | Status: DC
  Administered 2013-07-14 – 2013-07-16 (×11): 600 mg via ORAL
  Filled 2013-07-14 (×12): qty 2

## 2013-07-14 MED ORDER — TIZANIDINE HCL 4 MG PO TABS
4.0000 mg | ORAL_TABLET | Freq: Once | ORAL | Status: AC
  Administered 2013-07-14: 4 mg via ORAL
  Filled 2013-07-14: qty 1

## 2013-07-14 MED ORDER — METOPROLOL SUCCINATE ER 25 MG PO TB24
25.0000 mg | ORAL_TABLET | Freq: Every day | ORAL | Status: DC
  Administered 2013-07-14 – 2013-07-16 (×3): 25 mg via ORAL
  Filled 2013-07-14 (×3): qty 1

## 2013-07-14 MED ORDER — DEXTROSE 5 % IV SOLN
1.0000 g | INTRAVENOUS | Status: DC
  Administered 2013-07-15 – 2013-07-16 (×2): 1 g via INTRAVENOUS
  Filled 2013-07-14 (×3): qty 10

## 2013-07-14 MED ORDER — SODIUM CHLORIDE 0.9 % IV SOLN
INTRAVENOUS | Status: AC
  Administered 2013-07-14: 12:00:00 via INTRAVENOUS

## 2013-07-14 MED ORDER — ACETAMINOPHEN 325 MG PO TABS: 650.0000 mg | ORAL_TABLET | Freq: Four times a day (QID) | ORAL | Status: DC | PRN

## 2013-07-14 MED ORDER — OXYBUTYNIN CHLORIDE 5 MG PO TABS
5.0000 mg | ORAL_TABLET | Freq: Three times a day (TID) | ORAL | Status: DC
  Administered 2013-07-14 – 2013-07-16 (×8): 5 mg via ORAL
  Filled 2013-07-14 (×9): qty 1

## 2013-07-14 MED ORDER — EZETIMIBE 10 MG PO TABS
10.0000 mg | ORAL_TABLET | Freq: Every day | ORAL | Status: DC
  Administered 2013-07-14 – 2013-07-15 (×2): 10 mg via ORAL
  Filled 2013-07-14 (×3): qty 1

## 2013-07-14 NOTE — Progress Notes (Signed)
Notified [facility] that pt has been admitted to [facility] with [facility] representative and confirmation number of notification is [id number].  Pt has been placed on [facility] equipment which belongs to [employer] supply. Stacey Drain

## 2013-07-14 NOTE — Progress Notes (Signed)
Patient briefly seen and examined earlier in the ED. Database reviewed. Has a h/o multiple sclerosis with neurogenic bladder and a chronic indwelling foley catheter. Presented because his catheter was no longer functioning. Found to have a UTI/pyelonephritis with purulent urine and signs of SIRS. Continue rocephin pending cx data. Will continue to follow.  Domingo Mend, MD [facility] Pager: [phone]

## 2013-07-14 NOTE — ED Provider Notes (Signed)
Medical screening examination/treatment/procedure(s) were performed by non-physician practitioner and as supervising physician I was immediately available for consultation/collaboration.        Neta Ehlers, MD 07/14/13 [phone]

## 2013-07-14 NOTE — ED Provider Notes (Signed)
Pt received from [place], PA-C.  Pt has MS and chronic indwelling foley for neurogenic bladder.  Presented to ED w/ urinary retention.  Foley irrigated and retention resolved.  U/A positive for infection.  Labs otherwise sig for leukocytosis.  Pt has persistent tachycardia despite 2L NS.  Admitted [facility] for likely Urose[facility].  Pt aware of plan.  His VS are currently stable and he is no complaints/concerns. 2:38 AM   Remer Macho, PA-C 07/14/13 [phone]

## 2013-07-14 NOTE — ED Provider Notes (Addendum)
Medical screening examination/treatment/procedure(s) were performed by non-physician practitioner and as supervising physician I was immediately available for consultation/collaboration.   Date: 07/14/2013  Rate: 134  Rhythm: sinus tachycardia  QRS Axis: normal  Intervals: normal  ST/T Wave abnormalities: nonspecific T wave changes  Conduction Disutrbances:early r wave progression  Narrative Interpretation:   Old EKG Reviewed: unchanged    Neta Ehlers, MD 07/14/13 1623  Neta Ehlers, MD 07/14/13 4709

## 2013-07-14 NOTE — H&P (Signed)
PCP:   Alvester Chou, NP   Chief Complaint:  Foley not working  HPI: 53 yo male h/o multiple sclerosis, neurogenic bladder with chronic indwelling foley since nov 14, freq utis since, pressure ulcers comes in due to his foley catheter not drainiing today.  No fevers.  No chills.  No n/v/d.  Pt has tachycardia, and elevated wbc.  His foley was changed, and had very cloudy purulent like urine come through.  No abd pain.  Pt has no complaints.  Last several urine cx have been sensitive to rocephin.  Review of Systems:  Positive and negative as per HPI otherwise all other systems are negative  Past Medical History: Past Medical History  Diagnosis Date  . Multiple sclerosis   . Pressure ulcer, buttock(707.05)   . Pain in joint, pelvic region and thigh   . Spasm of muscle   . Unspecified vitamin D deficiency   . Disturbance of skin sensation   . Right ureteral stone 01/03/13  . Renal stones   . Neurogenic bladder     01/13/13 FOLEY CATH  . Urinary retention with incomplete bladder emptying 01/13/13    FOLEY CATHETER  . Neuromuscular disorder     multiple schlorosis since 1983  . Hyponatremia 01/27/2013  . DNR (do not resuscitate)     yellow copy   Past Surgical History  Procedure Laterality Date  . Wound debridement Right 10/15/2012    Procedure: DEBRIDMENT OF RIGHT HIP DECUBITUS AND SACRAL DECUBITUS;  Surgeon: Earnstine Regal, MD;  Location: [facility];  Service: General;  Laterality: Right;  left lateral position, right hip  . Nephrolithotomy Right 01/25/2013    Procedure: RIGHT PERCUTANEOUS NEPHROLITHOTOMY ;  Surgeon: Malka So, MD;  Location: [facility];  Service: Urology;  Laterality: Right;  . Kidney stone surgery      x 2    Medications: Prior to Admission medications   Medication Sig Start Date End Date Taking? Authorizing Provider  amantadine (SYMMETREL) 100 MG capsule Take 100 mg by mouth 2 (two) times daily.    Yes Historical Provider, MD  BETASERON 0.3 MG KIT injection INJECT  0.$RemoveB'25MG'jFubRzVy$              SUBCUTANEOUSLY EVERY      OTHER DAY 05/24/13  Yes Penni Bombard, MD  collagenase (SANTYL) ointment Apply topically daily. Apply to Unstageable pressure ulcer on coccyx once daily.  Apply 1/8 inch thick and top with saline dressing.  Change daily and PRN for soiling or loosening of dressing x 21 days. 09/21/12  Yes Modena Jansky, MD  dantrolene (DANTRIUM) 25 MG capsule Take 25 mg by mouth 3 (three) times daily.   Yes Historical Provider, MD  ezetimibe (ZETIA) 10 MG tablet Take 10 mg by mouth at bedtime.    Yes Historical Provider, MD  feeding supplement (ENSURE COMPLETE) LIQD Take 237 mLs by mouth 3 (three) times daily between meals. 09/21/12  Yes Modena Jansky, MD  gabapentin (NEURONTIN) 600 MG tablet Take 600 mg by mouth 4 (four) times daily.   Yes Historical Provider, MD  HYDROcodone-acetaminophen (NORCO/VICODIN) 5-325 MG per tablet Take 2 tablets by mouth every 6 (six) hours as needed for moderate pain. Takes it about 80mins before home health care worker moves his legs. Also q6h prn 10/25/12  Yes Tiffany L Reed, DO  levofloxacin (LEVAQUIN) 500 MG tablet Take 1 tablet (500 mg total) by mouth daily. 04/17/13  Yes Allie Bossier, MD  metoprolol succinate (TOPROL-XL) 25 MG 24  hr tablet Take 1 tablet (25 mg total) by mouth daily. 01/28/13  Yes Irine Seal, MD  modafinil (PROVIGIL) 200 MG tablet Take 200 mg by mouth 2 (two) times daily.   Yes Historical Provider, MD  Multiple Vitamin (MULTIVITAMIN WITH MINERALS) TABS Take 1 tablet by mouth daily. 09/21/12  Yes Modena Jansky, MD  oxybutynin (DITROPAN) 5 MG tablet Take 5 mg by mouth 3 (three) times daily.   Yes Historical Provider, MD  OxyCODONE (OXYCONTIN) 10 mg T12A 12 hr tablet Take 10 mg by mouth every 12 (twelve) hours.   Yes Historical Provider, MD  tiZANidine (ZANAFLEX) 4 MG tablet Take 4 mg by mouth 4 (four) times daily.    Yes Historical Provider, MD  valACYclovir (VALTREX) 1000 MG tablet Take 1,000 mg by mouth every 8 (eight)  hours. For 7 days   Yes Historical Provider, MD    Allergies:  No Known Allergies  Social History:  reports that he has never smoked. He has never used smokeless tobacco. He reports that he does not drink alcohol or use illicit drugs.  Family History: Family History  Problem Relation Age of Onset  . Hypertension Mother   . Dementia Mother   . Hypertension Father   . Heart disease Father     Physical Exam: Filed Vitals:   07/14/13 0130 07/14/13 0131 07/14/13 0132 07/14/13 0200  BP: 104/70  104/70 109/68  Pulse:   116 114  Temp:  99.1 F (37.3 C) 99.1 F (37.3 C)   TempSrc:  Rectal Rectal   Resp: $Remo'16  18 15  'FYVFb$ SpO2:   96% 97%   General appearance: alert, cooperative and no distress Head: Normocephalic, without obvious abnormality, atraumatic Eyes: negative Nose: Nares normal. Septum midline. Mucosa normal. No drainage or sinus tenderness. Neck: no JVD and supple, symmetrical, trachea midline Lungs: clear to auscultation bilaterally Heart: regular rate and rhythm, S1, S2 normal, no murmur, click, rub or gallop Abdomen: soft, non-tender; bowel sounds normal; no masses,  no organomegaly Extremities: extremities normal, atraumatic, no cyanosis or edema Pulses: 2+ and symmetric Skin: Skin color, texture, turgor normal. No rashes or lesions Neurologic: cn grossly intact    Labs on Admission:   Recent Labs  07/13/13 0010  NA 133*  K 4.4  CL 97  CO2 19  GLUCOSE 98  BUN 19  CREATININE 0.76  CALCIUM 8.7    Recent Labs  07/13/13 0010  WBC 15.2*  NEUTROABS 12.2*  HGB 13.6  HCT 40.7  MCV 77.4*  PLT 316   Radiological Exams on Admission: Dg Chest 2 View  07/14/2013   CLINICAL DATA:  Shortness of breath.  EXAM: CHEST  2 VIEW  COMPARISON:  01/20/2013  FINDINGS: Cardiomediastinal silhouette is stable.  Chronic interstitial prominence and upper lung volume loss again noted.  There is no evidence of focal airspace disease, pulmonary edema, suspicious pulmonary  nodule/mass, pleural effusion, or pneumothorax. No acute bony abnormalities are identified.  IMPRESSION: No evidence of acute cardiopulmonary disease.  Chronic interstitial and pulmonary changes.   Electronically Signed   By: Hassan Rowan M.D.   On: 07/14/2013 01:33    Assessment/Plan  53 yo male h/o multiple sclerosis and recurrent complicated uti  Principal Problem:   UTI (lower urinary tract infection)- foley has been changed.  Place on rocephin.  Ivf.  Urine culture sent.    Active Problems:   Multiple sclerosis   Tachycardia   Neurogenic bladder   Urinary retention with incomplete bladder emptying  Protein-calorie malnutrition, severe  Pt is DNR.  [name] Mccoy A 07/14/2013, 2:40 AM

## 2013-07-14 NOTE — Consult Note (Signed)
WOC wound consult note Reason for Consult:Chronic, non-healing Stage IV pressure Ulcers, Patient is known to our department from previous admissions. Wound type:Pressure Pressure Ulcer POA: Yes Measurement:Right hip:  Stage IV, 8.5cm x 7cm x 0.2cm.  Area is elevated and has not reepithelialized as has the surround tissue.  Sacral Stage IV pressure ulcer, 5cm x 2cm x 0.4cm.  !00% red, moist, non-granulating.  Coccyx Stage IV pressure ulcer, 4cm x 1cm x 0.4cm.  !00% red, moist non-granulating. Wound bed:As described above. Drainage (amount, consistency, odor) Previous negative pressure wound therapy device in place with nothing in cannister. Periwound:Intact, with evidence of previous contracting and scarring. Dressing procedure/placement/frequency:NPWT device to be changed three times weekly on Thursday, Saturday and Tuesdays by bedside RN. Turn and reposition side to side due to severe contractures. Onset nursing team will not follow, but will remain available to this patient, the nursing and medical team.  Please re-consult if needed. Thanks, Maudie Flakes, MSN, RN, [place], [place], [place] [phone])

## 2013-07-15 DIAGNOSIS — R4182 Altered mental status, unspecified: Secondary | ICD-10-CM

## 2013-07-15 DIAGNOSIS — R627 Adult failure to thrive: Secondary | ICD-10-CM

## 2013-07-15 DIAGNOSIS — N390 Urinary tract infection, site not specified: Secondary | ICD-10-CM

## 2013-07-15 NOTE — Plan of Care (Signed)
Problem: Phase II Progression Outcomes Goal: Obtain order to discontinue catheter if appropriate Outcome: Not Applicable Date Met:  45/62/56 Chronic foley

## 2013-07-15 NOTE — Clinical Documentation Improvement (Signed)
Noted on  07/14/13:  "Found to have a UTI/pyelonephritis with purulent urine and signs of SIRS" Please clarify if above infection is device related.  Possible Clinical Conditions?  Septicemia / Sepsis Severe Sepsis  SIRS Sepsis with UTI Sepsis due to an internal device   Other Condition  Cannot clinically Determine   Risk Factors: Neurogenic bladder, chronic indwelling foley cath, recurrent complicated uti,Urinary retention with incomplete bladder emptying    07/13/13 0010   WBC  15.2*   NEUTROABS  12.2*    Treatment: Rocephin IV.   Urine culture. Changed foley catheter.  Thank You, Philippa Chester ,RN Clinical Documentation Specialist:  [employer]

## 2013-07-15 NOTE — Progress Notes (Signed)
INITIAL NUTRITION ASSESSMENT  DOCUMENTATION CODES Per approved criteria  -Severe malnutrition in the context of chronic illness -Underweight   INTERVENTION: - Continue Ensure Complete TID, 8 oz provides 350 kcal, 13 g protein - Continue daily multivitamin - Encourage intake of high calorie/high protein foods  NUTRITION DIAGNOSIS: Increased nutrient needs related to multiple pressure ulcer as evidenced by [place] RN notes.   Goal: Patient will meet >/=90% of estimated nutrition needs  Monitor:  PO intake of meals and supplements, weight, labs  Reason for Assessment: Low [place]  53 y.o. male  Admitting Dx: UTI (lower urinary tract infection)  ASSESSMENT: Patient with history of multiple sclerosis, pressure ulcers (stage IV hip, coccyx), and severe malnutrition, admitted with UTI and signs of SIRS. Patient reports that he generally eats well at home, and is currently eating 100% of meals. He also drinks 2-3 Ensure shakes at home, and has Ensure Complete ordered TID. He reports 100% intake.  Previous admission found severe malnutrition in the context of chronic illness with weight loss and severe muscle and fat loss. No recent weight, and RD unable to weight patient in room, but he reports that his weight is likely around 135, which is 9 pounds up from November 2014. However, he still does have severe muscle wasting at the clavicles, and subcutaneous fat wasting at the upper arms, meeting the criteria for continued severe malnutrition.   Height: Ht Readings from Last 1 Encounters:  04/13/13 6\' 3"  (1.905 m)    Weight: Wt Readings from Last 1 Encounters:  04/13/13 126 lb 5.2 oz (57.3 kg)  No recent weight, patient reports current weight at around 135 pounds.   Ideal Body Weight: 196 pounds  % Ideal Body Weight: 69%, Based off of patient reported weight of 135 pounds  Wt Readings from Last 10 Encounters:  04/13/13 126 lb 5.2 oz (57.3 kg)  04/03/13 135 lb (61.236 kg)  01/29/13  127 lb 13.9 oz (58 kg)  01/29/13 127 lb 13.9 oz (58 kg)  01/20/13 123 lb 0.3 oz (55.8 kg)  01/08/13 139 lb 5.3 oz (63.2 kg)  11/09/12 134 lb (60.782 kg)  10/14/12 139 lb 5.3 oz (63.2 kg)  10/14/12 139 lb 5.3 oz (63.2 kg)  09/17/12 136 lb 11 oz (62 kg)    Usual Body Weight: 135 pounds  % Usual Body Weight: ~100%  BMI:  There is no weight on file to calculate BMI. BMI per patient reported weight is 16.9, patient is underweight.   Estimated Nutritional Needs: Kcal: 2050-2200 kcal Protein: 90-105 g Fluid: >2.1 L/day  Skin: Stage 4 pressure ulcers, hip, coccyx  Diet Order: Cardiac  EDUCATION NEEDS: -No education needs identified at this time   Intake/Output Summary (Last 24 hours) at 07/15/13 1414 Last data filed at 07/15/13 0911  Gross per 24 hour  Intake    530 ml  Output   1000 ml  Net   -470 ml    Last BM: PTA   Labs:   Recent Labs Lab 07/13/13 0010  NA 133*  K 4.4  CL 97  CO2 19  BUN 19  CREATININE 0.76  CALCIUM 8.7  GLUCOSE 98    CBG (last 3)  No results found for this basename: GLUCAP,  in the last 72 hours  Scheduled Meds: . amantadine  100 mg Oral BID  . cefTRIAXone (ROCEPHIN)  IV  1 g Intravenous Q24H  . dantrolene  25 mg Oral TID  . enoxaparin (LOVENOX) injection  40 mg Subcutaneous Q24H  .  ezetimibe  10 mg Oral QHS  . feeding supplement (ENSURE COMPLETE)  237 mL Oral TID BM  . gabapentin  600 mg Oral QID  . metoprolol succinate  25 mg Oral Daily  . modafinil  200 mg Oral BID  . multivitamin with minerals  1 tablet Oral Daily  . oxybutynin  5 mg Oral TID  . OxyCODONE  10 mg Oral Q12H  . tiZANidine  4 mg Oral QID    Continuous Infusions:   Past Medical History  Diagnosis Date  . Multiple sclerosis   . Pressure ulcer, buttock(707.05)   . Pain in joint, pelvic region and thigh   . Spasm of muscle   . Unspecified vitamin D deficiency   . Disturbance of skin sensation   . Right ureteral stone 01/03/13  . Renal stones   . Neurogenic  bladder     01/13/13 FOLEY CATH  . Urinary retention with incomplete bladder emptying 01/13/13    FOLEY CATHETER  . Neuromuscular disorder     multiple schlorosis since 1983  . Hyponatremia 01/27/2013  . DNR (do not resuscitate)     yellow copy    Past Surgical History  Procedure Laterality Date  . Wound debridement Right 10/15/2012    Procedure: DEBRIDMENT OF RIGHT HIP DECUBITUS AND SACRAL DECUBITUS;  Surgeon: Earnstine Regal, MD;  Location: [facility];  Service: General;  Laterality: Right;  left lateral position, right hip  . Nephrolithotomy Right 01/25/2013    Procedure: RIGHT PERCUTANEOUS NEPHROLITHOTOMY ;  Surgeon: Malka So, MD;  Location: [facility];  Service: Urology;  Laterality: Right;  . Kidney stone surgery      x 2    Larey Seat, RD, LDN Pager #: [phone] After-Hours Pager #: [phone]

## 2013-07-15 NOTE — Progress Notes (Signed)
[facility] PROGRESS NOTE  Cleda Daub. Karim PXT:[id number] DOB: 1960/06/02 DOA: 07/13/2013 PCP: Alvester Chou, NP  Assessment/Plan: Sepsis 2/2 UTI -Continue rocephin pending cx data. -Cx with >100,000 GNR. -Has MS with a chronic indwelling foley catheter. -BP improved with fluid resuscitation.  Sacral Decubitus Ulcers -Wound vac. -Appreciate wound care recs.  Severe Protein-Caloric Malnutrition -Nutrition consultation.  Multiple Sclerosis -At baseline.  Code Status: DNR Family Communication: Patient only  Disposition Plan: Home when ready; likely 24-48 hours.   Consultants:  None   Antibiotics:  Rocephin   Subjective: No complaints/events.  Objective: Filed Vitals:   07/14/13 2310 07/14/13 2329 07/15/13 0126 07/15/13 0535  BP: 64/48 78/54 80/54  85/61  Pulse:    105  Temp:    98.7 F (37.1 C)  TempSrc:    Oral  Resp:    18  SpO2:    99%    Intake/Output Summary (Last 24 hours) at 07/15/13 1444 Last data filed at 07/15/13 0911  Gross per 24 hour  Intake    530 ml  Output   1000 ml  Net   -470 ml   There were no vitals filed for this visit.  Exam:   General:  AA Ox3  Cardiovascular: RRR  Respiratory: CTA B  Abdomen: S/NT/ND/+BS  Extremities: no C/C/E   Neurologic:  Severe LE contractures. Moves UE without issue.  Data Reviewed: Basic Metabolic Panel:  Recent Labs Lab 07/13/13 0010  NA 133*  K 4.4  CL 97  CO2 19  GLUCOSE 98  BUN 19  CREATININE 0.76  CALCIUM 8.7   Liver Function Tests: No results found for this basename: AST, ALT, ALKPHOS, BILITOT, PROT, ALBUMIN,  in the last 168 hours No results found for this basename: LIPASE, AMYLASE,  in the last 168 hours No results found for this basename: AMMONIA,  in the last 168 hours CBC:  Recent Labs Lab 07/13/13 0010  WBC 15.2*  NEUTROABS 12.2*  HGB 13.6  HCT 40.7  MCV 77.4*  PLT 316   Cardiac Enzymes: No results found for this basename: CKTOTAL, CKMB, CKMBINDEX,  TROPONINI,  in the last 168 hours BNP (last 3 results) No results found for this basename: PROBNP,  in the last 8760 hours CBG: No results found for this basename: GLUCAP,  in the last 168 hours  Recent Results (from the past 240 hour(s))  URINE CULTURE     Status: None   Collection Time    07/13/13  9:46 PM      Result Value Ref Range Status   Specimen Description URINE, CATHETERIZED   Final   Special Requests NONE   Final   Culture  Setup Time     Final   Value: 07/14/2013 01:23     Performed at [facility]     Final   Value: >=100,000 COLONIES/ML     Performed at [facility]   Culture     Final   Value: [place]     Performed at [facility]   Report Status PENDING   Incomplete  CULTURE, BLOOD (ROUTINE X 2)     Status: None   Collection Time    07/14/13 12:55 AM      Result Value Ref Range Status   Specimen Description BLOOD BLOOD RIGHT FOREARM   Final   Special Requests BOTTLES DRAWN AEROBIC AND ANAEROBIC 4.5CC   Final   Culture  Setup Time     Final  Value: 07/14/2013 04:00     Performed at [facility]     Final   Value:        BLOOD CULTURE RECEIVED NO GROWTH TO DATE CULTURE WILL BE HELD FOR 5 DAYS BEFORE ISSUING A FINAL NEGATIVE REPORT     Performed at [facility]   Report Status PENDING   Incomplete  CULTURE, BLOOD (ROUTINE X 2)     Status: None   Collection Time    07/14/13  1:00 AM      Result Value Ref Range Status   Specimen Description BLOOD RIGHT WRIST   Final   Special Requests BOTTLES DRAWN AEROBIC AND ANAEROBIC 5CC   Final   Culture  Setup Time     Final   Value: 07/14/2013 04:00     Performed at [facility]   Culture     Final   Value:        BLOOD CULTURE RECEIVED NO GROWTH TO DATE CULTURE WILL BE HELD FOR 5 DAYS BEFORE ISSUING A FINAL NEGATIVE REPORT     Performed at [facility]   Report Status PENDING   Incomplete     Studies: Dg Chest 2  View  07/14/2013   CLINICAL DATA:  Shortness of breath.  EXAM: CHEST  2 VIEW  COMPARISON:  01/20/2013  FINDINGS: Cardiomediastinal silhouette is stable.  Chronic interstitial prominence and upper lung volume loss again noted.  There is no evidence of focal airspace disease, pulmonary edema, suspicious pulmonary nodule/mass, pleural effusion, or pneumothorax. No acute bony abnormalities are identified.  IMPRESSION: No evidence of acute cardiopulmonary disease.  Chronic interstitial and pulmonary changes.   Electronically Signed   By: Hassan Rowan M.D.   On: 07/14/2013 01:33    Scheduled Meds: . amantadine  100 mg Oral BID  . cefTRIAXone (ROCEPHIN)  IV  1 g Intravenous Q24H  . dantrolene  25 mg Oral TID  . enoxaparin (LOVENOX) injection  40 mg Subcutaneous Q24H  . ezetimibe  10 mg Oral QHS  . feeding supplement (ENSURE COMPLETE)  237 mL Oral TID BM  . gabapentin  600 mg Oral QID  . metoprolol succinate  25 mg Oral Daily  . modafinil  200 mg Oral BID  . multivitamin with minerals  1 tablet Oral Daily  . oxybutynin  5 mg Oral TID  . OxyCODONE  10 mg Oral Q12H  . tiZANidine  4 mg Oral QID   Continuous Infusions:   Principal Problem:   UTI (lower urinary tract infection) Active Problems:   Multiple sclerosis   Tachycardia   SIRS (systemic inflammatory response syndrome)   Neurogenic bladder   Urinary retention with incomplete bladder emptying   Protein-calorie malnutrition, severe   Pyelonephritis    Time spent: 35 minutes. Greater than 50% of this time was spent in direct contact with the patient coordinating care.    Lelon Frohlich  [facility] Pager [phone]  If 7PM-7AM, please contact night-coverage at [email or web address], password [facility] 07/15/2013, 2:44 PM  LOS: 2 days

## 2013-07-16 LAB — BASIC METABOLIC PANEL
BUN: 9 mg/dL (ref 6–23)
CO2: 26 mEq/L (ref 19–32)
CREATININE: 0.51 mg/dL (ref 0.50–1.35)
Calcium: 8.8 mg/dL (ref 8.4–10.5)
Chloride: 97 mEq/L (ref 96–112)
GFR calc Af Amer: >90 mL/min (ref >90)
GFR calc non Af Amer: >90 mL/min (ref >90)
GLUCOSE: 99 mg/dL (ref 70–99)
POTASSIUM: 4.2 meq/L (ref 3.7–5.3)
Sodium: 134 mEq/L — ABNORMAL LOW (ref 137–147)

## 2013-07-16 LAB — CBC
HEMATOCRIT: 35.8 % — AB (ref 39.0–52.0)
Hemoglobin: 11.3 g/dL — ABNORMAL LOW (ref 13.0–17.0)
MCH: 25.6 pg — ABNORMAL LOW (ref 26.0–34.0)
MCHC: 31.6 g/dL (ref 30.0–36.0)
MCV: 81.2 fL (ref 78.0–100.0)
PLATELETS: 242 10*3/uL (ref 150–400)
RBC: 4.41 MIL/uL (ref 4.22–5.81)
RDW: 16.2 % — AB (ref 11.5–15.5)
WBC: 5.7 10*3/uL (ref 4.0–10.5)

## 2013-07-16 LAB — URINE CULTURE: Colony Count: >=100000

## 2013-07-16 MED ORDER — INTERFERON BETA-1B 0.3 MG ~~LOC~~ KIT: 3.0000 mg | PACK | Freq: Once | SUBCUTANEOUS | Status: DC

## 2013-07-16 MED ORDER — INTERFERON BETA-1B 0.3 MG ~~LOC~~ KIT
0.2500 mg | PACK | Freq: Once | SUBCUTANEOUS | Status: AC
  Administered 2013-07-16: 0.25 mg via SUBCUTANEOUS

## 2013-07-16 MED ORDER — CIPROFLOXACIN HCL 500 MG PO TABS: 500.0000 mg | ORAL_TABLET | Freq: Two times a day (BID) | ORAL | Status: DC

## 2013-07-16 NOTE — Discharge Summary (Signed)
Physician Discharge Summary  [name] Mccoy. [name] Mccoy:[id number] DOB: Sep 26, 1960 DOA: 07/13/2013  PCP: Alvester Chou, NP  Admit date: 07/13/2013 Discharge date: 07/16/2013  Time spent: 45 minutes  Recommendations for Outpatient Follow-up:  -Will be discharged home today. -Advised to follow up with PCP in 2 weeks.   Discharge Diagnoses:  Principal Problem:   UTI (lower urinary tract infection) Active Problems:   Multiple sclerosis   Tachycardia   SIRS (systemic inflammatory response syndrome)   Neurogenic bladder   Urinary retention with incomplete bladder emptying   Protein-calorie malnutrition, severe   Pyelonephritis   Discharge Condition: Stable and improved  Filed Weights   07/15/13 1528  Weight: 61.236 kg (135 lb)    History of present illness:  53 yo male h/o multiple sclerosis, neurogenic bladder with chronic indwelling foley since nov 14, freq utis since, pressure ulcers comes in due to his foley catheter not drainiing today. No fevers. No chills. No n/v/d. Pt has tachycardia, and elevated wbc. His foley was changed, and had very cloudy purulent like urine come through. No abd pain. Pt has no complaints. Last several urine cx have been sensitive to rocephin. Hospitalist admission was requested.   Hospital Course:   Sepsis 2/2 UTI  -Sepsis parameters have resolved. -Cx with Proteus pansensitive. -Will DC on a 7 days course of cipro (received 3 days of rocephin in the hospital).  Sacral Decubitus Ulcers  -Wound vac.  -Appreciate wound care recs.   Severe Protein-Caloric Malnutrition  -Nutrition has seen: ensure TID.   Multiple Sclerosis  -At baseline.   Procedures:  None   Consultations:  None  Discharge Instructions  Discharge Orders   Future Appointments Provider Department Dept Phone   09/09/2013 9:30 AM Philmore Pali, NP [facility] [phone]   Future Orders Complete By Expires   Discontinue IV  As directed    Increase activity  slowly  As directed        Medication List    STOP taking these medications       levofloxacin 500 MG tablet  Commonly known as:  LEVAQUIN      TAKE these medications       amantadine 100 MG capsule  Commonly known as:  SYMMETREL  Take 100 mg by mouth 2 (two) times daily.     BETASERON 0.3 MG Kit injection  Generic drug:  Interferon Beta-1b  INJECT 0.$RemoveB'25MG'[id number]$              SUBCUTANEOUSLY EVERY      OTHER DAY     ciprofloxacin 500 MG tablet  Commonly known as:  CIPRO  Take 1 tablet (500 mg total) by mouth 2 (two) times daily. For 7 days     collagenase ointment  Commonly known as:  SANTYL  Apply topically daily. Apply to Unstageable pressure ulcer on coccyx once daily.  Apply 1/8 inch thick and top with saline dressing.  Change daily and PRN for soiling or loosening of dressing x 21 days.     dantrolene 25 MG capsule  Commonly known as:  DANTRIUM  Take 25 mg by mouth 3 (three) times daily.     ezetimibe 10 MG tablet  Commonly known as:  ZETIA  Take 10 mg by mouth at bedtime.     feeding supplement (ENSURE COMPLETE) Liqd  Take 237 mLs by mouth 3 (three) times daily between meals.     gabapentin 600 MG tablet  Commonly known as:  NEURONTIN  Take 600 mg by mouth 4 (  four) times daily.     HYDROcodone-acetaminophen 5-325 MG per tablet  Commonly known as:  NORCO/VICODIN  Take 2 tablets by mouth every 6 (six) hours as needed for moderate pain. 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 Also q6h prn     metoprolol succinate 25 MG 24 hr tablet  Commonly known as:  TOPROL-XL  Take 1 tablet (25 mg total) by mouth daily.     modafinil 200 MG tablet  Commonly known as:  PROVIGIL  Take 200 mg by mouth 2 (two) times daily.     multivitamin with minerals Tabs tablet  Take 1 tablet by mouth daily.     oxybutynin 5 MG tablet  Commonly known as:  DITROPAN  Take 5 mg by mouth 3 (three) times daily.     OxyCODONE 10 mg T12a 12 hr tablet  Commonly known  as:  OXYCONTIN  Take 10 mg by mouth every 12 (twelve) hours.     tiZANidine 4 MG tablet  Commonly known as:  ZANAFLEX  Take 4 mg by mouth 4 (four) times daily.     valACYclovir 1000 MG tablet  Commonly known as:  VALTREX  Take 1,000 mg by mouth every 8 (eight) hours. For 7 days       No Known Allergies     Follow-up Information   Follow up with Alvester Chou, NP. Schedule an appointment as soon as possible for a visit in 2 weeks.   Specialty:  Nurse Practitioner   Contact information:   [facility] [place] [address] [phone]        The results of significant diagnostics from this hospitalization (including imaging, microbiology, ancillary and laboratory) are listed below for reference.    Significant Diagnostic Studies: Dg Chest 2 View  07/14/2013   CLINICAL DATA:  Shortness of breath.  EXAM: CHEST  2 VIEW  COMPARISON:  01/20/2013  FINDINGS: Cardiomediastinal silhouette is stable.  Chronic interstitial prominence and upper lung volume loss again noted.  There is no evidence of focal airspace disease, pulmonary edema, suspicious pulmonary nodule/mass, pleural effusion, or pneumothorax. No acute bony abnormalities are identified.  IMPRESSION: No evidence of acute cardiopulmonary disease.  Chronic interstitial and pulmonary changes.   Electronically Signed   By: Hassan Rowan M.D.   On: 07/14/2013 01:33    Microbiology: Recent Results (from the past 240 hour(s))  URINE CULTURE     Status: None   Collection Time    07/13/13  9:46 PM      Result Value Ref Range Status   Specimen Description URINE, CATHETERIZED   Final   Special Requests NONE   Final   Culture  Setup Time     Final   Value: 07/14/2013 01:23     Performed at [facility]     Final   Value: >=100,000 COLONIES/ML     Performed at [facility]   Culture     Final   Value: PROTEUS MIRABILIS     Performed at [facility]   Report  Status 07/16/2013 FINAL   Final   Organism ID, Bacteria PROTEUS MIRABILIS   Final  CULTURE, BLOOD (ROUTINE X 2)     Status: None   Collection Time    07/14/13 12:55 AM      Result Value Ref Range Status   Specimen Description BLOOD BLOOD RIGHT FOREARM   Final  Special Requests BOTTLES DRAWN AEROBIC AND ANAEROBIC 4.5CC   Final   Culture  Setup Time     Final   Value: 07/14/2013 04:00     Performed at [facility]   Culture     Final   Value:        BLOOD CULTURE RECEIVED NO GROWTH TO DATE CULTURE WILL BE HELD FOR 5 DAYS BEFORE ISSUING A FINAL NEGATIVE REPORT     Performed at [facility]   Report Status PENDING   Incomplete  CULTURE, BLOOD (ROUTINE X 2)     Status: None   Collection Time    07/14/13  1:00 AM      Result Value Ref Range Status   Specimen Description BLOOD RIGHT WRIST   Final   Special Requests BOTTLES DRAWN AEROBIC AND ANAEROBIC 5CC   Final   Culture  Setup Time     Final   Value: 07/14/2013 04:00     Performed at [facility]   Culture     Final   Value:        BLOOD CULTURE RECEIVED NO GROWTH TO DATE CULTURE WILL BE HELD FOR 5 DAYS BEFORE ISSUING A FINAL NEGATIVE REPORT     Performed at [facility]   Report Status PENDING   Incomplete     Labs: Basic Metabolic Panel:  Recent Labs Lab 07/13/13 0010 07/16/13 0536  NA 133* 134*  K 4.4 4.2  CL 97 97  CO2 19 26  GLUCOSE 98 99  BUN 19 9  CREATININE 0.76 0.51  CALCIUM 8.7 8.8   Liver Function Tests: No results found for this basename: AST, ALT, ALKPHOS, BILITOT, PROT, ALBUMIN,  in the last 168 hours No results found for this basename: LIPASE, AMYLASE,  in the last 168 hours No results found for this basename: AMMONIA,  in the last 168 hours CBC:  Recent Labs Lab 07/13/13 0010 07/16/13 0536  WBC 15.2* 5.7  NEUTROABS 12.2*  --   HGB 13.6 11.3*  HCT 40.7 35.8*  MCV 77.4* 81.2  PLT 316 242   Cardiac Enzymes: No results found for this basename: CKTOTAL, CKMB,  CKMBINDEX, TROPONINI,  in the last 168 hours BNP: BNP (last 3 results) No results found for this basename: PROBNP,  in the last 8760 hours CBG: No results found for this basename: GLUCAP,  in the last 168 hours     Signed:  Lelon Frohlich  [facility] Pager: [phone] 07/16/2013, 10:02 AM

## 2013-07-20 LAB — CULTURE, BLOOD (ROUTINE X 2)
CULTURE: NO GROWTH
Culture: NO GROWTH

## 2013-09-09 ENCOUNTER — Ambulatory Visit: Payer: BC Managed Care – PPO | Admitting: Nurse Practitioner

## 2013-09-09 ENCOUNTER — Ambulatory Visit (INDEPENDENT_AMBULATORY_CARE_PROVIDER_SITE_OTHER): Payer: Medicaid Other | Admitting: Nurse Practitioner

## 2013-09-09 ENCOUNTER — Encounter: Payer: Self-pay | Admitting: Nurse Practitioner

## 2013-09-09 VITALS — BP 101/70 | HR 70

## 2013-09-09 DIAGNOSIS — M62838 Other muscle spasm: Secondary | ICD-10-CM

## 2013-09-09 DIAGNOSIS — L89309 Pressure ulcer of unspecified buttock, unspecified stage: Secondary | ICD-10-CM

## 2013-09-09 DIAGNOSIS — G35 Multiple sclerosis: Secondary | ICD-10-CM

## 2013-09-09 DIAGNOSIS — E559 Vitamin D deficiency, unspecified: Secondary | ICD-10-CM

## 2013-09-09 DIAGNOSIS — G35D Multiple sclerosis, unspecified: Secondary | ICD-10-CM

## 2013-09-09 MED ORDER — INTERFERON BETA-1B 0.3 MG ~~LOC~~ KIT: PACK | SUBCUTANEOUS | Status: DC

## 2013-09-09 MED ORDER — MODAFINIL 200 MG PO TABS: 200.0000 mg | ORAL_TABLET | Freq: Two times a day (BID) | ORAL | Status: DC

## 2013-09-09 MED ORDER — GABAPENTIN 600 MG PO TABS: 600.0000 mg | ORAL_TABLET | Freq: Four times a day (QID) | ORAL | Status: DC

## 2013-09-09 NOTE — Patient Instructions (Signed)
Continue Betaseron, refills sent to [facility].  Prescription given for Provigil.  Gabapentin Rx sent to [facility].  PT/OT HH therapy ordered with [facility].  Follow up in 6 months.

## 2013-09-09 NOTE — Progress Notes (Signed)
PATIENT: [name] Mccoy. Blattner DOB: 1960/08/20  REASON FOR VISIT: routine MS follow up HISTORY FROM: patient  HISTORY OF PRESENT ILLNESS: UPDATE 09/09/13 (LL):  Patient returns for follow up, accompanied by caregiver and POA.  He has been doing some better since last visit; decubitus ulcers have healed by 75% with wound vac therapy.  He reports increasing fine motor skill loss in his left hand which is the dominant.  He has not been abe to get Provigil or Gabapentin through Medicaid.  POA hopes to have Provigil paid for through his trust fund.  Caregiver states Medicaid required PA with Gabapentin - not sure why it wasn't covered.  He had Urosepsis in Feb and was hospitalized and now has foley changed monthly at [facility].  Tolerating Betaseron well.  UPDATE 03/07/13 (VP): Since last visit with Dr. love in January 2014 patient has had multiple issues including some type of side effect related to Botox injection in March 2014 (resulting in bilateral hip pain and muscle spasm), kidney stone and urinary tract infection, hospitalization, diagnosed with bilateral hip fractures on x-ray. Patient's hip and hamstring contractures have continued to worsen. He has severe decubitus ulcers. He has gone through rehabilitation but now is living back at home. He is accompanied by a caregiver who has been working with him since the past 8-12 months. Patient is no longer able to sit in a wheelchair for transport. He has to be transported to office visits by ambulance and stretcher. Patient continues to take Betaseron. Occasionally he has flulike reactions.   PRIOR HPI (05/31/12, Dr. Erling Cruz): 53 -year-old left-handed African American single male with multiple sclerosis characterized by gait disorder beginning at age 53 in 53 when he presented with double vision. Intermittently he has taken steroids and as been on Betaseron therapy since 1999. He received Novantrone 11/2002, 03/2003, 05/2003, 06/2003, and 08/2003. He was on  monthly IV SoluMedrol beginning in November 2008 and noticed improvement with that medication but I cannot objectively document it. 06/2011 he developed "sprains in his hips" with soreness in his groin region bilaterally. He had increased stiffness and inability to walk since then. He was in a wheelchair at home living by himself or in bed.He was not walking or standing. He was admitted to [facility] March through June 2013 with decubitus ulcers and received PT. He has been home. 01/09/2012 and 03/16/2012, he developed left hand weakness with inability to write and I gave him 2 courses of IV Solu-Medrol followed by by mouth prednisone with improvement. 12/25/2011 CBC and CMP were normal except elevated alk phos. 01/27/2012 he had evidence of urinary tract infection. He comes in in a wheelchair. He cannot stand or take steps with assitance because of severe left greater than right leg weakness and hamstring contractions. He denies bowel or bladder dysfunction or Lhermittes sign. He has not had any falls. 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 His last bone density study 10/21/2010 showed low bone mass with osteopenia. His last MRI scan of the cervical spine with and without contrast 04/07/2007 was abnormal showing mild diffuse degenerative disc disease, with central canal stenosis at C4-5, diffuse neuroforaminal stenosis left greater than right at C5-6 and no cord lesions.  Last MRI of the brain 04/09/2007 showed bilateral lesions consistent with the diagnosis of MS.He last walked 06/2011. He requires assistance  in dressing, bathing, taking care of his toileting needs and bowel habits.He has not fallen. He denies double vision, swallowing problems, slurred speech, or Lhermitte's sign He has home health aides from 10 a.m. to 6 p.m. and  10 p.m. to 2 a.m. He has not seen Dr. Gladstone Lighter for evaluation of his painful hips. X-rays 07/10/2011 showed no acute bony abnormality in the pelvis. He had Botox injection 04/29/2012 to the lower extremities with minimal improvement He is a candidate for motorized wheelchair because he cannot stand or walk. He requires assistance in activities of daily living. He cannot use a manual wheelchair because of left arm weakness. With a transfer board he can transfer to a motorized wheelchair. He needs significant back support because of scoliosis. A scooter would not suffice. He has mental and physical abilities to operate a wheelchair. He has not walked since 06/2011. He only uses a wheelchair. He cannot stand. A motorized wheelchair will allow transportation in his single-story home and improve his ability to do activities of daily living. He can get through the doorways to the bathroom.   REVIEW OF SYSTEMS: Full 14 system review of systems performed and notable only for weakness itching weight loss fatigue.  ALLERGIES: No Known Allergies  HOME MEDICATIONS: Outpatient Prescriptions Prior to Visit  Medication Sig Dispense Refill  . amantadine (SYMMETREL) 100 MG capsule Take 100 mg by mouth 2 (two) times daily.       . dantrolene (DANTRIUM) 25 MG capsule Take 25 mg by mouth 3 (three) times daily.      Marland Kitchen ezetimibe (ZETIA) 10 MG tablet Take 10 mg by mouth at bedtime.       . feeding supplement (ENSURE COMPLETE) LIQD Take 237 mLs by mouth 3 (three) times daily between meals.      Marland Kitchen HYDROcodone-acetaminophen (NORCO/VICODIN) 5-325 MG per tablet Take 2 tablets by mouth every 6 (six) hours as needed for moderate pain. Takes it about 53mins before home health care worker moves his legs. Also q6h prn      . metoprolol succinate (TOPROL-XL) 25 MG 24 hr tablet Take 1 tablet (25 mg total) by mouth daily.  30 tablet  11  . Multiple Vitamin (MULTIVITAMIN WITH MINERALS) TABS Take 1 tablet by mouth daily.      Marland Kitchen oxybutynin  (DITROPAN) 5 MG tablet Take 5 mg by mouth 3 (three) times daily.      . OxyCODONE (OXYCONTIN) 10 mg T12A 12 hr tablet Take 10 mg by mouth every 12 (twelve) hours.      Marland Kitchen tiZANidine (ZANAFLEX) 4 MG tablet Take 4 mg by mouth 4 (four) times daily.       Marland Kitchen BETASERON 0.3 MG KIT injection INJECT 0.25MG              SUBCUTANEOUSLY EVERY      OTHER DAY  14 kit  6  . ciprofloxacin (CIPRO) 500 MG tablet Take 1 tablet (500 mg total) by mouth 2 (two) times daily. For 7 days  14 tablet  0  . collagenase (SANTYL) ointment Apply topically daily. Apply to Unstageable pressure ulcer on coccyx once daily.  Apply 1/8 inch thick and top with saline dressing.  Change daily and PRN for soiling or loosening of dressing x 21 days.  90 g  1  . gabapentin (NEURONTIN) 600 MG tablet Take 600 mg by mouth 4 (four) times daily.      . modafinil (PROVIGIL) 200 MG tablet Take 200 mg by mouth 2 (two) times daily.      Marland Kitchen  valACYclovir (VALTREX) 1000 MG tablet Take 1,000 mg by mouth every 8 (eight) hours. For 7 days       No facility-administered medications prior to visit.     PHYSICAL EXAM  Filed Vitals:   09/09/13 0950  BP: 101/70  Pulse: 70   There is no weight on file to calculate BMI.  GENERAL EXAM:  Patient is in no distress; FRAIL AND CACHETIC.  CARDIOVASCULAR:  Regular rate and rhythm, no murmurs, no carotid bruits SKIN: Small Stage 2 high sacral decubitus and slightly larger Stage 3 lower sacral decubitus   NEUROLOGIC:  MENTAL STATUS: awake, alert, language fluent, comprehension intact, naming intact  CRANIAL NERVE: pupils equal and reactive to light, visual fields full to confrontation, extraocular muscles intact, no nystagmus, facial sensation and strength symmetric, uvula midline, shoulder shrug symmetric, tongue midline.  MOTOR: BUE ATROPHY (4/5). LUE INCOORDINATION. BILATERAL LEGS SEVERE HIP AND HAMSTRING CONTRACTURES WITH LEFTWARD ROTATION. RLE 1/5 PROX. LLE 0/5.  SENSORY: normal and symmetric to light  touch  COORDINATION: finger-nose-finger, fine finger movements normal  REFLEXES: BUE 2, KNEES 2.  GAIT/STATION: LAYING ON TRANSPORT STRETCHING.  ASSESSMENT AND PLAN 53 y.o. year old male here with multiple sclerosis, advanced. Now bedbound with severe BLE contractures.   PLAN:  - continue betaseron  - continue dantrolene, tizanidine, gabapentin, provigil - HH PT/OT through [facility] - Follow up in 6 months, sooner as needed.   Orders Placed This Encounter  Procedures  . Ambulatory referral to [facility] ordered this encounter  Medications  . gabapentin (NEURONTIN) 600 MG tablet    Sig: Take 1 tablet (600 mg total) by mouth 4 (four) times daily.    Dispense:  120 tablet    Refill:  5    Order Specific Question:  Supervising Provider    Answer:  Andrey Spearman R [[id number]]  . modafinil (PROVIGIL) 200 MG tablet    Sig: Take 1 tablet (200 mg total) by mouth 2 (two) times daily.    Dispense:  60 tablet    Refill:  5    Order Specific Question:  Supervising Provider    Answer:  Andrey Spearman R [[id number]]  . Interferon Beta-1b (BETASERON) 0.3 MG KIT injection    Sig: INJECT 0.$RemoveBefor'25MG'[id number]$              SUBCUTANEOUSLY EVERY      OTHER DAY    Dispense:  14 kit    Refill:  12    Order Specific Question:  Supervising Provider    Answer:  Penni Bombard [[id number]]   Return in about 6 months (around 03/11/2014).  Philmore Pali, MSN, NP-C 09/09/2013, 12:30 PM [facility] [address], [place], [place] [address] [phone]  Note: This document was prepared with digital dictation and possible smart phrase technology. Any transcriptional errors that result from this process are unintentional.

## 2013-10-28 NOTE — Progress Notes (Signed)
I reviewed note and agree with plan.   Jaime Dome R. Kelsey Durflinger, MD  Certified in Neurology, Neurophysiology and Neuroimaging  [facility] [address], [address] [place], [place] [address] [phone]   

## 2013-11-27 ENCOUNTER — Other Ambulatory Visit: Payer: Self-pay

## 2013-11-27 MED ORDER — GABAPENTIN 300 MG PO CAPS: 600.0000 mg | ORAL_CAPSULE | Freq: Four times a day (QID) | ORAL | Status: DC

## 2013-11-27 NOTE — Telephone Encounter (Signed)
Patient's ins will no longer pay for Gabapentin Tablets, however, they will cover Gabapentin Capsules.  The total dose remains the same.

## 2013-12-02 ENCOUNTER — Emergency Department (HOSPITAL_COMMUNITY)
Admission: EM | Admit: 2013-12-02 | Discharge: 2013-12-02 | Disposition: A | Discharge status: Home / Self Care | Payer: Medicaid Other | Attending: Emergency Medicine | Admitting: Emergency Medicine

## 2013-12-02 ENCOUNTER — Encounter (HOSPITAL_COMMUNITY): Payer: Self-pay | Admitting: Emergency Medicine

## 2013-12-02 DIAGNOSIS — R339 Retention of urine, unspecified: Secondary | ICD-10-CM | POA: Diagnosis present

## 2013-12-02 DIAGNOSIS — N390 Urinary tract infection, site not specified: Secondary | ICD-10-CM | POA: Insufficient documentation

## 2013-12-02 DIAGNOSIS — Z79899 Other long term (current) drug therapy: Secondary | ICD-10-CM | POA: Diagnosis not present

## 2013-12-02 DIAGNOSIS — Z8669 Personal history of other diseases of the nervous system and sense organs: Secondary | ICD-10-CM | POA: Diagnosis not present

## 2013-12-02 DIAGNOSIS — Z466 Encounter for fitting and adjustment of urinary device: Secondary | ICD-10-CM | POA: Diagnosis not present

## 2013-12-02 DIAGNOSIS — Z862 Personal history of diseases of the blood and blood-forming organs and certain disorders involving the immune mechanism: Secondary | ICD-10-CM | POA: Diagnosis not present

## 2013-12-02 DIAGNOSIS — Z872 Personal history of diseases of the skin and subcutaneous tissue: Secondary | ICD-10-CM | POA: Diagnosis not present

## 2013-12-02 DIAGNOSIS — Z87442 Personal history of urinary calculi: Secondary | ICD-10-CM | POA: Insufficient documentation

## 2013-12-02 DIAGNOSIS — Z8639 Personal history of other endocrine, nutritional and metabolic disease: Secondary | ICD-10-CM | POA: Insufficient documentation

## 2013-12-02 LAB — URINALYSIS, ROUTINE W REFLEX MICROSCOPIC
BILIRUBIN URINE: NEGATIVE
Glucose, UA: NEGATIVE mg/dL
Ketones, ur: NEGATIVE mg/dL
Nitrite: POSITIVE — AB
PH: 6 (ref 5.0–8.0)
Protein, ur: 100 mg/dL — AB
SPECIFIC GRAVITY, URINE: 1.013 (ref 1.005–1.030)
Urobilinogen, UA: 0.2 mg/dL (ref 0.0–1.0)

## 2013-12-02 LAB — URINE MICROSCOPIC-ADD ON

## 2013-12-02 MED ORDER — CIPROFLOXACIN HCL 500 MG PO TABS: 500.0000 mg | ORAL_TABLET | Freq: Two times a day (BID) | ORAL | Status: DC

## 2013-12-02 NOTE — ED Provider Notes (Signed)
TIME SEEN: 5:31 PM  CHIEF COMPLAINT: Leaking around Foley catheter  HPI: Patient is a 53 year old male with history of multiple sclerosis, neurogenic bladder with Foley catheter, contractures of his lower extremities, sacral ulcers who presents to the emergency department with leaking around his Foley catheter for the past 4 days. He denies any complaints of pain. No fevers, chills, vomiting or diarrhea. States his Foley catheter was changed by Dr. Jeffie Pollock one month ago and he has an appointment next week to have his Foley catheter replaced. No penile discharge. No testicular pain or swelling.  ROS: See HPI Constitutional: no fever  Eyes: no drainage  ENT: no runny nose   Cardiovascular:  no chest pain  Resp: no SOB  GI: no vomiting GU: no dysuria Integumentary: no rash  Allergy: no hives  Musculoskeletal: no leg swelling  Neurological: no slurred speech ROS otherwise negative  PAST MEDICAL HISTORY/PAST SURGICAL HISTORY:  Past Medical History  Diagnosis Date  . Multiple sclerosis   . Pressure ulcer, buttock(707.05)   . Pain in joint, pelvic region and thigh   . Spasm of muscle   . Unspecified vitamin D deficiency   . Disturbance of skin sensation   . Right ureteral stone 01/03/13  . Renal stones   . Neurogenic bladder     01/13/13 FOLEY CATH  . Urinary retention with incomplete bladder emptying 01/13/13    FOLEY CATHETER  . Neuromuscular disorder     multiple schlorosis since 1983  . Hyponatremia 01/27/2013  . DNR (do not resuscitate)     yellow copy    MEDICATIONS:  Prior to Admission medications   Medication Sig Start Date End Date Taking? Authorizing Provider  amantadine (SYMMETREL) 100 MG capsule Take 100 mg by mouth 2 (two) times daily.    Yes Historical Provider, MD  dantrolene (DANTRIUM) 25 MG capsule Take 25 mg by mouth 3 (three) times daily.   Yes Historical Provider, MD  ezetimibe (ZETIA) 10 MG tablet Take 10 mg by mouth at bedtime.    Yes Historical Provider, MD   feeding supplement (ENSURE COMPLETE) LIQD Take 237 mLs by mouth 3 (three) times daily between meals. 09/21/12  Yes Modena Jansky, MD  gabapentin (NEURONTIN) 300 MG capsule Take 2 capsules (600 mg total) by mouth 4 (four) times daily. 09/09/13  Yes Philmore Pali, NP  HYDROcodone-acetaminophen (NORCO/VICODIN) 5-325 MG per tablet Take 2 tablets by mouth every 6 (six) hours as needed for moderate pain. Takes it about 44mins before home health care worker moves his legs. Also q6h prn 10/25/12  Yes Tiffany L Reed, DO  Interferon Beta-1b (BETASERON) 0.3 MG KIT injection INJECT 0.25MG              SUBCUTANEOUSLY EVERY      OTHER DAY 09/09/13  Yes Philmore Pali, NP  metoprolol succinate (TOPROL-XL) 25 MG 24 hr tablet Take 25 mg by mouth daily. 01/28/13  Yes Malka So, MD  Multiple Vitamin (MULTIVITAMIN WITH MINERALS) TABS Take 1 tablet by mouth daily. 09/21/12  Yes Modena Jansky, MD  oxybutynin (DITROPAN) 5 MG tablet Take 5 mg by mouth 3 (three) times daily.   Yes Historical Provider, MD  OxyCODONE (OXYCONTIN) 10 mg T12A 12 hr tablet Take 10 mg by mouth every 12 (twelve) hours.   Yes Historical Provider, MD  tiZANidine (ZANAFLEX) 4 MG tablet Take 4 mg by mouth 4 (four) times daily.    Yes Historical Provider, MD    ALLERGIES:  No Known Allergies  SOCIAL HISTORY:  History  Substance Use Topics  . Smoking status: Never Smoker   . Smokeless tobacco: Never Used  . Alcohol Use: No    FAMILY HISTORY: Family History  Problem Relation Age of Onset  . Hypertension Mother   . Dementia Mother   . Hypertension Father   . Heart disease Father     EXAM: BP 89/72  Pulse 82  Temp(Src) 97.7 F (36.5 C) (Oral)  Resp 20  SpO2 99% CONSTITUTIONAL: Alert and oriented and responds appropriately to questions. Well-appearing; well-nourished HEAD: Normocephalic EYES: Conjunctivae clear, PERRL ENT: normal nose; no rhinorrhea; moist mucous membranes; pharynx without lesions noted NECK: Supple, no meningismus, no LAD   CARD: RRR; S1 and S2 appreciated; no murmurs, no clicks, no rubs, no gallops RESP: Normal chest excursion without splinting or tachypnea; breath sounds clear and equal bilaterally; no wheezes, no rhonchi, no rales,  ABD/GI: Normal bowel sounds; non-distended; soft, non-tender, no rebound, no guarding GU:  Patient has a Foley catheter in place that is 35 [place], he does have erosion of his urethra into the posterior penis the patient reports is chronic, no penile discharge, no bleeding, no testicular pain or swelling, scrotal masses, no peritoneal warmth or erythema or crepitus BACK:  The back appears normal and is non-tender to palpation, there is no CVA tenderness EXT: Normal ROM in all joints; non-tender to palpation; no edema; normal capillary refill; no cyanosis    SKIN: Normal color for age and race; warm NEURO: Moves all extremities equally PSYCH: The patient's mood and manner are appropriate. Grooming and personal hygiene are appropriate.  MEDICAL DECISION MAKING: Patient here with leaking around his Foley catheter, requesting replacement. He states he is otherwise feeling well. His blood pressure slightly low but this appears chronic for him, reviewed patient's prior notes. Will replace Foley catheter, send urinalysis and culture. I do not feel he needs labs today or admission. Patient agrees with plan.  ED PROGRESS: Patient's Foley catheter has been exchanged. He does have a nitrite positive urinary tract infection. His last several urine cultures have grown Proteus that was sensitive to Cipro. We'll discharge him with Cipro, return precautions and supportive care instructions. He has outpatient urology followup next week.     [place], DO 12/02/13 1944

## 2013-12-02 NOTE — ED Notes (Signed)
Pt coming from home due to he has a leaking foley cath. Pt has MS and has had cath placed 1 yr ago and changed a few months ago.

## 2013-12-02 NOTE — ED Notes (Signed)
Bed: WA17 Expected date:  Expected time:  Means of arrival:  Comments: EMS 

## 2013-12-02 NOTE — ED Notes (Signed)
[facility] called for transportation. ETA a few hours. Pt's caregiver asked [place] to call her upon [facility] arrival [phone]).

## 2013-12-02 NOTE — ED Notes (Signed)
Dr. Ward at bedside.

## 2013-12-02 NOTE — Discharge Instructions (Signed)
Foley Catheter Care, Adult A Foley catheter is a soft, flexible tube that is placed into the bladder to drain urine. A Foley catheter may be inserted if:  You leak urine or are not able to control when you urinate (urinary incontinence).  You are not able to urinate when you need to (urinary retention).  You had prostate surgery or surgery on the genitals.  You have certain medical conditions, such as multiple sclerosis, dementia, or a spinal cord injury. If you are going home with a Foley catheter in place, follow the instructions below. TAKING CARE OF THE CATHETER 1. Wash your hands with soap and water. 2. Using mild soap and warm water on a clean washcloth:  Clean the area on your body closest to the catheter insertion site using a circular motion, moving away from the catheter. Never wipe toward the catheter because this could sweep bacteria up into the urethra and cause infection.  Remove all traces of soap. Pat the area dry with a clean towel. For males, reposition the foreskin. 3. Attach the catheter to your leg so there is no tension on the catheter. Use adhesive tape or a leg strap. If you are using adhesive tape, remove any sticky residue left behind by the previous tape you used. 4. Keep the drainage bag below the level of the bladder, but keep it off the floor. 5. Check throughout the day to be sure the catheter is working and urine is draining freely. Make sure the tubing does not become kinked. 6. Do not pull on the catheter or try to remove it. Pulling could damage internal tissues. TAKING CARE OF THE DRAINAGE BAGS You will be given two drainage bags to take home. One is a large overnight drainage bag, and the other is a smaller leg bag that fits underneath clothing. You may wear the overnight bag at any time, but you should never wear the smaller leg bag at night. Follow the instructions below for how to empty, change, and clean your drainage bags. Emptying the Drainage  Bag You must empty your drainage bag when it is  - full or at least 2-3 times a day. 1. Wash your hands with soap and water. 2. Keep the drainage bag below your hips, below the level of your bladder. This stops urine from going back into the tubing and into your bladder. 3. Hold the dirty bag over the toilet or a clean container. 4. Open the pour spout at the bottom of the bag and empty the urine into the toilet or container. Do not let the pour spout touch the toilet, container, or any other surface. Doing so can place bacteria on the bag, which can cause an infection. 5. Clean the pour spout with a gauze pad or cotton ball that has rubbing alcohol on it. 6. Close the pour spout. 7. Attach the bag to your leg with adhesive tape or a leg strap. 8. Wash your hands well. Changing the Drainage Bag Change your drainage bag once a month or sooner if it starts to smell bad or look dirty. Below are steps to follow when changing the drainage bag. 1. Wash your hands with soap and water. 2. Pinch off the rubber catheter so that urine does not spill out. 3. Disconnect the catheter tube from the drainage tube at the connection valve. Do not let the tubes touch any surface. 4. Clean the end of the catheter tube with an alcohol wipe. Use a different alcohol wipe to clean  the end of the drainage tube. 5. Connect the catheter tube to the drainage tube of the clean drainage bag. 6. Attach the new bag to the leg with adhesive tape or a leg strap. Avoid attaching the new bag too tightly. 7. Wash your hands well. Cleaning the Drainage Bag 1. Wash your hands with soap and water. 2. Wash the bag in warm, soapy water. 3. Rinse the bag thoroughly with warm water. 4. Fill the bag with a solution of white vinegar and water (1 cup vinegar to 1 qt warm water [.2 L vinegar to 1 L warm water]). Close the bag and soak it for 30 minutes in the solution. 5. Rinse the bag with warm water. 6. Hang the bag to dry with the  pour spout open and hanging downward. 7. Store the clean bag (once it is dry) in a clean plastic bag. 8. Wash your hands well. PREVENTING INFECTION  Wash your hands before and after handling your catheter.  Take showers daily and wash the area where the catheter enters your body. Do not take baths. Replace wet leg straps with dry ones, if this applies.  Do not use powders, sprays, or lotions on the genital area. Only use creams, lotions, or ointments as directed by your caregiver.  For females, wipe from front to back after each bowel movement.  Drink enough fluids to keep your urine clear or pale yellow unless you have a fluid restriction.  Do not let the drainage bag or tubing touch or lie on the floor.  Wear cotton underwear to absorb moisture and to keep your skin drier. SEEK MEDICAL CARE IF:   Your urine is cloudy or smells unusually bad.  Your catheter becomes clogged.  You are not draining urine into the bag or your bladder feels full.  Your catheter starts to leak. SEEK IMMEDIATE MEDICAL CARE IF:   You have pain, swelling, redness, or pus where the catheter enters the body.  You have pain in the abdomen, legs, lower back, or bladder.  You have a fever.  You see blood fill the catheter, or your urine is pink or red.  You have nausea, vomiting, or chills.  Your catheter gets pulled out. MAKE SURE YOU:   Understand these instructions.  Will watch your condition.  Will get help right away if you are not doing well or get worse. Document Released: 05/05/2005 Document Revised: 08/30/2012 Document Reviewed: 04/26/2012 [facility] Patient Information 2015 [place], [place]. This information is not intended to replace advice given to you by your health care provider. Make sure you discuss any questions you have with your health care provider.  Urinary Tract Infection Urinary tract infections (UTIs) can develop anywhere along your urinary tract. Your urinary tract is your  body's drainage system for removing wastes and extra water. Your urinary tract includes two kidneys, two ureters, a bladder, and a urethra. Your kidneys are a pair of bean-shaped organs. Each kidney is about the size of your fist. They are located below your ribs, one on each side of your spine. CAUSES Infections are caused by microbes, which are microscopic organisms, including fungi, viruses, and bacteria. These organisms are so small that they can only be seen through a microscope. Bacteria are the microbes that most commonly cause UTIs. SYMPTOMS  Symptoms of UTIs may vary by age and gender of the patient and by the location of the infection. Symptoms in young women typically include a frequent and intense urge to urinate and a painful,  burning feeling in the bladder or urethra during urination. Older women and men are more likely to be tired, shaky, and weak and have muscle aches and abdominal pain. A fever may mean the infection is in your kidneys. Other symptoms of a kidney infection include pain in your back or sides below the ribs, nausea, and vomiting. DIAGNOSIS To diagnose a UTI, your caregiver will ask you about your symptoms. Your caregiver also will ask to provide a urine sample. The urine sample will be tested for bacteria and white blood cells. White blood cells are made by your body to help fight infection. TREATMENT  Typically, UTIs can be treated with medication. Because most UTIs are caused by a bacterial infection, they usually can be treated with the use of antibiotics. The choice of antibiotic and length of treatment depend on your symptoms and the type of bacteria causing your infection. HOME CARE INSTRUCTIONS  If you were prescribed antibiotics, take them exactly as your caregiver instructs you. Finish the medication even if you feel better after you have only taken some of the medication.  Drink enough water and fluids to keep your urine clear or pale yellow.  Avoid caffeine,  tea, and carbonated beverages. They tend to irritate your bladder.  Empty your bladder often. Avoid holding urine for long periods of time.  Empty your bladder before and after sexual intercourse.  After a bowel movement, women should cleanse from front to back. Use each tissue only once. SEEK MEDICAL CARE IF:   You have back pain.  You develop a fever.  Your symptoms do not begin to resolve within 3 days. SEEK IMMEDIATE MEDICAL CARE IF:   You have severe back pain or lower abdominal pain.  You develop chills.  You have nausea or vomiting.  You have continued burning or discomfort with urination. MAKE SURE YOU:   Understand these instructions.  Will watch your condition.  Will get help right away if you are not doing well or get worse. Document Released: 02/12/2005 Document Revised: 11/04/2011 Document Reviewed: 06/13/2011 [facility] Patient Information 2015 [place], [place]. This information is not intended to replace advice given to you by your health care provider. Make sure you discuss any questions you have with your health care provider.

## 2013-12-04 LAB — URINE CULTURE

## 2014-01-13 ENCOUNTER — Telehealth: Payer: Self-pay | Admitting: *Deleted

## 2014-01-13 NOTE — Telephone Encounter (Signed)
Sharyn Lull calling for more information on pt problem list and med list for completion of CAP application.   Faxed [phone].  DONE.

## 2014-03-03 ENCOUNTER — Other Ambulatory Visit: Payer: Self-pay

## 2014-03-13 ENCOUNTER — Ambulatory Visit (INDEPENDENT_AMBULATORY_CARE_PROVIDER_SITE_OTHER): Payer: Medicaid Other | Admitting: Diagnostic Neuroimaging

## 2014-03-13 DIAGNOSIS — M62838 Other muscle spasm: Secondary | ICD-10-CM

## 2014-03-13 DIAGNOSIS — G35 Multiple sclerosis: Secondary | ICD-10-CM

## 2014-03-13 NOTE — Progress Notes (Signed)
PATIENT: [name] Mccoy. [name] Mccoy DOB: 1960-10-21  REASON FOR VISIT: routine MS follow up HISTORY FROM: patient and caregiver (with transporters)  HISTORY OF PRESENT ILLNESS:  UPDATE 03/13/14: No new MS symptoms. Doing better than last visit (more mobile). Wounds have healed nicely. Tolerating betaseron, but with intermittent flu like rxn and diff with skin scarring and lack of injection sites. Spasms are stable and controlled with medications.  UPDATE 09/09/13 (LL):  Patient returns for follow up, accompanied by caregiver and POA.  He has been doing some better since last visit; decubitus ulcers have healed by 75% with wound vac therapy.  He reports increasing fine motor skill loss in his left hand which is the dominant.  He has not been abe to get Provigil or Gabapentin through Medicaid.  POA hopes to have Provigil paid for through his trust fund.  Caregiver states Medicaid required PA with Gabapentin - not sure why it wasn't covered.  He had Urosepsis in Feb and was hospitalized and now has foley changed monthly at [facility].  Tolerating Betaseron well.  UPDATE 03/07/13 (VP): Since last visit with Dr. love in January 2014 patient has had multiple issues including some type of side effect related to Botox injection in March 2014 (resulting in bilateral hip pain and muscle spasm), kidney stone and urinary tract infection, hospitalization, diagnosed with bilateral hip fractures on x-ray. Patient's hip and hamstring contractures have continued to worsen. He has severe decubitus ulcers. He has gone through rehabilitation but now is living back at home. He is accompanied by a caregiver who has been working with him since the past 8-12 months. Patient is no longer able to sit in a wheelchair for transport. He has to be transported to office visits by ambulance and stretcher. Patient continues to take Betaseron. Occasionally he has flulike reactions.   PRIOR HPI (05/31/12, Dr. Erling Cruz): 44 -year-old left-handed  African American single male with multiple sclerosis characterized by gait disorder beginning at age 36 in 76 when he presented with double vision. Intermittently he has taken steroids and as been on Betaseron therapy since 1999. He received Novantrone 11/2002, 03/2003, 05/2003, 06/2003, and 08/2003. He was on monthly IV SoluMedrol beginning in November 2008 and noticed improvement with that medication but I cannot objectively document it. 06/2011 he developed "sprains in his hips" with soreness in his groin region bilaterally. He had increased stiffness and inability to walk since then. He was in a wheelchair at home living by himself or in bed.He was not walking or standing. He was admitted to [facility] center March through June 2013 with decubitus ulcers and received PT. He has been home. 01/09/2012 and 03/16/2012, he developed left hand weakness with inability to write and I gave him 2 courses of IV Solu-Medrol followed by by mouth prednisone with improvement. 12/25/2011 CBC and CMP were normal except elevated alk phos. 01/27/2012 he had evidence of urinary tract infection. He comes in in a wheelchair. He cannot stand or take steps with assitance because of severe left greater than right leg weakness and hamstring contractions. He denies bowel or bladder dysfunction or Lhermittes sign. He has not had any falls. 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 His last bone density study 10/21/2010 showed low bone mass with osteopenia. His last MRI scan of the cervical spine with and without contrast 04/07/2007 was  abnormal showing mild diffuse degenerative disc disease, with central canal stenosis at C4-5, diffuse neuroforaminal stenosis left greater than right at C5-6 and no cord lesions. Last MRI of the brain 04/09/2007 showed bilateral lesions  consistent with the diagnosis of MS. He last walked 06/2011. He requires assistance in dressing, bathing, taking care of his toileting needs and bowel habits.He has not fallen. He denies double vision, swallowing problems, slurred speech, or Lhermitte's sign He has home health aides from 10 a.m. to 6 p.m. and 10 p.m. to 2 a.m. He has not seen Dr. Gladstone Lighter for evaluation of his painful hips. X-rays 07/10/2011 showed no acute bony abnormality in the pelvis. He had Botox injection 04/29/2012 to the lower extremities with minimal improvement He is a candidate for motorized wheelchair because he cannot stand or walk. He requires assistance in activities of daily living. He cannot use a manual wheelchair because of left arm weakness. With a transfer board he can transfer to a motorized wheelchair. He needs significant back support because of scoliosis. A scooter would not suffice. He has mental and physical abilities to operate a wheelchair. He has not walked since 06/2011. He only uses a wheelchair. He cannot stand. A motorized wheelchair will allow transportation in his single-story home and improve his ability to do activities of daily living. He can get through the doorways to the bathroom.   REVIEW OF SYSTEMS: Full 14 system review of systems performed and notable only for fatigue, heat intolerance nausea wounds.   ALLERGIES: No Known Allergies  HOME MEDICATIONS: Outpatient Prescriptions Prior to Visit  Medication Sig Dispense Refill  . amantadine (SYMMETREL) 100 MG capsule Take 100 mg by mouth 2 (two) times daily.       . ciprofloxacin (CIPRO) 500 MG tablet Take 1 tablet (500 mg total) by mouth 2 (two) times daily.  14 tablet  0  . dantrolene (DANTRIUM) 25 MG capsule Take 25 mg by mouth 3 (three) times daily.      . feeding supplement (ENSURE COMPLETE) LIQD Take 237 mLs by mouth 3 (three) times daily between meals.      . gabapentin (NEURONTIN) 300 MG capsule Take 2 capsules (600 mg total) by mouth 4 (four)  times daily.  240 capsule  3  . HYDROcodone-acetaminophen (NORCO/VICODIN) 5-325 MG per tablet Take 2 tablets by mouth every 6 (six) hours as needed for moderate pain. Takes it about 69mins before home health care worker moves his legs. Also q6h prn      . Interferon Beta-1b (BETASERON) 0.3 MG KIT injection INJECT 0.25MG              SUBCUTANEOUSLY EVERY      OTHER DAY  14 kit  12  . metoprolol succinate (TOPROL-XL) 25 MG 24 hr tablet Take 25 mg by mouth daily.      . Multiple Vitamin (MULTIVITAMIN WITH MINERALS) TABS Take 1 tablet by mouth daily.      Marland Kitchen oxybutynin (DITROPAN) 5 MG tablet Take 5 mg by mouth 3 (three) times daily.      Marland Kitchen tiZANidine (ZANAFLEX) 4 MG tablet Take 4 mg by mouth 4 (four) times daily.       . OxyCODONE (OXYCONTIN) 10 mg T12A 12 hr tablet Take 10 mg by mouth every 12 (twelve) hours.      Marland Kitchen ezetimibe (ZETIA) 10 MG tablet Take 10 mg by mouth at bedtime.        No facility-administered medications prior to visit.     PHYSICAL EXAM  There were no vitals filed for this visit. There is no weight on file to calculate BMI.  GENERAL EXAM: Patient is in no distress  CARDIOVASCULAR: Regular rate and rhythm, no murmurs, no carotid bruits    NEUROLOGIC:  MENTAL STATUS: awake, alert, language fluent, comprehension intact, naming intact  CRANIAL NERVE: pupils equal and reactive to light, visual fields full to confrontation, extraocular muscles intact, no nystagmus, facial sensation and strength symmetric, uvula midline, shoulder shrug symmetric, tongue midline.  MOTOR: BUE ATROPHY (4/5). LUE INCOORDINATION. BILATERAL LEGS SEVERE HIP AND HAMSTRING CONTRACTURES WITH LEFTWARD ROTATION. RLE 1/5 PROX AND 2-3 DISTALLY. LLE 1/5 IN TOES ONLY. SENSORY: normal and symmetric to light touch; SLIGHTLY REDUCED IN LEFT FOOT COORDINATION: SLIGHT ATAXIA IN LUE REFLEXES: BUE 2, KNEES 2.  GAIT/STATION: LAYING ON TRANSPORT STRETCHER   DIAGNOSTICS/IMAGING    04/07/07 MRI cervical spine - mild  degenerative disease C3-4 to C5-6; mild spinal stenosis at C4-5; no cord lesions.  04/07/07 MRI brain - multiple bilateral lesions consistent with multiple sclerosis; no enhancing lesions; atrophy    ASSESSMENT AND PLAN  53 y.o. year old male here with advanced multiple sclerosis, bedbound with severe BLE contractures. Having continued side effects from betaseron and intolerance to frequent injections. Discussed plegridy vs tecfidera vs tysabri. Will try plegridy for now.   PLAN:  - change betaseron to plegridy - check CBC, CMP, MRI brain - continue dantrolene, tizanidine, gabapentin, provigil - HH PT/OT through [facility] This Encounter  Procedures  . MR Brain W Wo Contrast  . CBC With differential/Platelet  . Comprehensive metabolic panel   Return in about 3 months (around 06/13/2014).  Penni Bombard, MD 03/50/0938, 1:82 AM Certified in Neurology, Neurophysiology and Neuroimaging  [facility] [address] [place], [place] [address] [phone]

## 2014-03-13 NOTE — Patient Instructions (Signed)
I will check MRI and lab testing.  We will plan to switch betaseron to plegridy.

## 2014-03-14 ENCOUNTER — Encounter: Payer: Self-pay | Admitting: Diagnostic Neuroimaging

## 2014-03-16 ENCOUNTER — Telehealth: Payer: Self-pay | Admitting: *Deleted

## 2014-03-16 NOTE — Telephone Encounter (Signed)
Form,[employer], [place] [employer] and [employer] to [employer] 03-16-14.

## 2014-03-20 DIAGNOSIS — Z0289 Encounter for other administrative examinations: Secondary | ICD-10-CM

## 2014-03-29 NOTE — Telephone Encounter (Signed)
Form,[employer] [place] [employer] and [employer] completed and received from Dr [name] and Larey Seat faxed 03-29-14.

## 2014-04-07 ENCOUNTER — Other Ambulatory Visit: Payer: Self-pay | Admitting: Nurse Practitioner

## 2014-04-11 ENCOUNTER — Encounter (INDEPENDENT_AMBULATORY_CARE_PROVIDER_SITE_OTHER): Payer: Medicaid Other | Admitting: Diagnostic Neuroimaging

## 2014-04-11 ENCOUNTER — Ambulatory Visit
Admission: RE | Admit: 2014-04-11 | Discharge: 2014-04-11 | Disposition: A | Discharge status: Home / Self Care | Payer: Medicaid Other | Source: Ambulatory Visit | Attending: Diagnostic Neuroimaging | Admitting: Diagnostic Neuroimaging

## 2014-04-11 DIAGNOSIS — G35 Multiple sclerosis: Secondary | ICD-10-CM

## 2014-04-11 DIAGNOSIS — M62838 Other muscle spasm: Secondary | ICD-10-CM

## 2014-04-11 MED ORDER — GADOBENATE DIMEGLUMINE 529 MG/ML IV SOLN
12.0000 mL | Freq: Once | INTRAVENOUS | Status: AC | PRN
Start: 2014-04-11 — End: 2014-04-11
  Administered 2014-04-11: 12 mL via INTRAVENOUS

## 2014-05-15 ENCOUNTER — Encounter: Payer: Self-pay | Admitting: Diagnostic Neuroimaging

## 2014-06-14 ENCOUNTER — Ambulatory Visit (INDEPENDENT_AMBULATORY_CARE_PROVIDER_SITE_OTHER): Payer: Medicaid Other | Admitting: Diagnostic Neuroimaging

## 2014-06-14 ENCOUNTER — Encounter: Payer: Self-pay | Admitting: Diagnostic Neuroimaging

## 2014-06-14 VITALS — BP 109/74 | HR 76 | Temp 97.2°F

## 2014-06-14 DIAGNOSIS — G35 Multiple sclerosis: Secondary | ICD-10-CM

## 2014-06-14 MED ORDER — DIMETHYL FUMARATE 240 MG PO CPDR: 240.0000 mg | DELAYED_RELEASE_CAPSULE | Freq: Two times a day (BID) | ORAL | Status: DC

## 2014-06-14 NOTE — Patient Instructions (Signed)
Will start process for tecfidera.

## 2014-06-14 NOTE — Progress Notes (Signed)
PATIENT: [name] Mccoy DOB: Mar 28, 1961  REASON FOR VISIT: routine MS follow up HISTORY FROM: patient and caregiver (with transporters)  HISTORY OF PRESENT ILLNESS:  UPDATE 06/14/14: Since last visit, doing about the same. Had MRI and labs done. Ready to switch to tecfidera vs plegdridy.  UPDATE 03/13/14: No new MS symptoms. Doing better than last visit (more mobile). Wounds have healed nicely. Tolerating betaseron, but with intermittent flu like rxn and diff with skin scarring and lack of injection sites. Spasms are stable and controlled with medications.  UPDATE 09/09/13 (LL):  Patient returns for follow up, accompanied by caregiver and POA.  He has been doing some better since last visit; decubitus ulcers have healed by 75% with wound vac therapy.  He reports increasing fine motor skill loss in his left hand which is the dominant.  He has not been abe to get Provigil or Gabapentin through Medicaid.  POA hopes to have Provigil paid for through his trust fund.  Caregiver states Medicaid required PA with Gabapentin - not sure why it wasn't covered.  He had Urosepsis in Feb and was hospitalized and now has foley changed monthly at [facility].  Tolerating Betaseron well.  UPDATE 03/07/13 (VP): Since last visit with Dr. love in January 2014 patient has had multiple issues including some type of side effect related to Botox injection in March 2014 (resulting in bilateral hip pain and muscle spasm), kidney stone and urinary tract infection, hospitalization, diagnosed with bilateral hip fractures on x-ray. Patient's hip and hamstring contractures have continued to worsen. He has severe decubitus ulcers. He has gone through rehabilitation but now is living back at home. He is accompanied by a caregiver who has been working with him since the past 8-12 months. Patient is no longer able to sit in a wheelchair for transport. He has to be transported to office visits by ambulance and stretcher. Patient  continues to take Betaseron. Occasionally he has flulike reactions.   PRIOR HPI (05/31/12, Dr. Erling Cruz): 54 -year-old left-handed African American single male with multiple sclerosis characterized by gait disorder beginning at age 54 in 79 when he presented with double vision. Intermittently he has taken steroids and as been on Betaseron therapy since 1999. He received Novantrone 11/2002, 03/2003, 05/2003, 06/2003, and 08/2003. He was on monthly IV SoluMedrol beginning in November 2008 and noticed improvement with that medication but I cannot objectively document it. 06/2011 he developed "sprains in his hips" with soreness in his groin region bilaterally. He had increased stiffness and inability to walk since then. He was in a wheelchair at home living by himself or in bed.He was not walking or standing. He was admitted to [facility] center March through June 2013 with decubitus ulcers and received PT. He has been home. 01/09/2012 and 03/16/2012, he developed left hand weakness with inability to write and I gave him 2 courses of IV Solu-Medrol followed by by mouth prednisone with improvement. 12/25/2011 CBC and CMP were normal except elevated alk phos. 01/27/2012 he had evidence of urinary tract infection. He comes in in a wheelchair. He cannot stand or take steps with assitance because of severe left greater than right leg weakness and hamstring contractions. He denies bowel or bladder dysfunction or Lhermittes sign. He has not had any falls. 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 His last bone density  study 10/21/2010 showed low bone mass with osteopenia. His last MRI scan of the cervical spine with and without contrast 04/07/2007 was abnormal showing mild diffuse degenerative disc disease, with central canal stenosis at C4-5, diffuse  neuroforaminal stenosis left greater than right at C5-6 and no cord lesions. Last MRI of the brain 04/09/2007 showed bilateral lesions consistent with the diagnosis of MS. He last walked 06/2011. He requires assistance in dressing, bathing, taking care of his toileting needs and bowel habits.He has not fallen. He denies double vision, swallowing problems, slurred speech, or Lhermitte's sign He has home health aides from 10 a.m. to 6 p.m. and 10 p.m. to 2 a.m. He has not seen Dr. Gladstone Lighter for evaluation of his painful hips. X-rays 07/10/2011 showed no acute bony abnormality in the pelvis. He had Botox injection 04/29/2012 to the lower extremities with minimal improvement He is a candidate for motorized wheelchair because he cannot stand or walk. He requires assistance in activities of daily living. He cannot use a manual wheelchair because of left arm weakness. With a transfer board he can transfer to a motorized wheelchair. He needs significant back support because of scoliosis. A scooter would not suffice. He has mental and physical abilities to operate a wheelchair. He has not walked since 06/2011. He only uses a wheelchair. He cannot stand. A motorized wheelchair will allow transportation in his single-story home and improve his ability to do activities of daily living. He can get through the doorways to the bathroom.   REVIEW OF SYSTEMS: Full 14 system review of systems performed and notable only for fatigue trouble swallowing light sensitivity heat intolerance daytime sleepiness rash wounds itching.   ALLERGIES: No Known Allergies  HOME MEDICATIONS: Outpatient Prescriptions Prior to Visit  Medication Sig Dispense Refill  . amantadine (SYMMETREL) 100 MG capsule Take 100 mg by mouth 2 (two) times daily.     . ciprofloxacin (CIPRO) 500 MG tablet Take 1 tablet (500 mg total) by mouth 2 (two) times daily. 14 tablet 0  . dantrolene (DANTRIUM) 25 MG capsule Take 25 mg by mouth 3 (three) times daily.    .  feeding supplement (ENSURE COMPLETE) LIQD Take 237 mLs by mouth 3 (three) times daily between meals.    . gabapentin (NEURONTIN) 300 MG capsule TAKE 2 CAPSULES 4 TIMES DAILY. 240 capsule 12  . HYDROcodone-acetaminophen (NORCO/VICODIN) 5-325 MG per tablet Take 2 tablets by mouth every 6 (six) hours as needed for moderate pain. Takes it about 9mns before home health care worker moves his legs. Also q6h prn    . Interferon Beta-1b (BETASERON) 0.3 MG KIT injection INJECT 0.25MG             SUBCUTANEOUSLY EVERY      OTHER DAY 14 kit 12  . metoprolol succinate (TOPROL-XL) 25 MG 24 hr tablet Take 25 mg by mouth daily.    . Multiple Vitamin (MULTIVITAMIN WITH MINERALS) TABS Take 1 tablet by mouth daily.    .Marland Kitchenoxybutynin (DITROPAN) 5 MG tablet Take 5 mg by mouth 3 (three) times daily.    .Marland KitchenoxyCODONE-acetaminophen (PERCOCET/ROXICET) 5-325 MG per tablet Take by mouth every 4 (four) hours as needed for severe pain.    . simvastatin (ZOCOR) 20 MG tablet Take 20 mg by mouth daily. 1 @@ night    . tiZANidine (ZANAFLEX) 4 MG tablet Take 4 mg by mouth 4 (four) times daily.      No facility-administered medications prior to visit.     PHYSICAL EXAM  Filed Vitals:   06/14/14 1448  BP: 109/74  Pulse: 76  Temp: 97.2 F (36.2 C)  TempSrc: Oral   There is no weight on file to calculate BMI.  GENERAL EXAM: Patient is in no distress  CARDIOVASCULAR: Regular rate and rhythm, no murmurs, no carotid bruits    NEUROLOGIC:  MENTAL STATUS: awake, alert, language fluent, comprehension intact, naming intact  CRANIAL NERVE: pupils equal and reactive to light, visual fields full to confrontation, extraocular muscles intact, no nystagmus, facial sensation and strength symmetric, uvula midline, shoulder shrug symmetric, tongue midline.  MOTOR: BUE ATROPHY (4/5). LUE INCOORDINATION. BILATERAL LEGS SEVERE HIP AND HAMSTRING CONTRACTURES WITH LEFTWARD ROTATION. RLE 1/5 PROX AND 2-3 DISTALLY. LLE 1/5 IN TOES  ONLY. SENSORY: normal and symmetric to light touch; SLIGHTLY REDUCED IN LEFT FOOT COORDINATION: SLIGHT ATAXIA IN LUE REFLEXES: BUE 2, KNEES 2.  GAIT/STATION: LAYING ON TRANSPORT STRETCHER   DIAGNOSTICS/IMAGING    04/07/07 MRI cervical spine - mild degenerative disease C3-4 to C5-6; mild spinal stenosis at C4-5; no cord lesions.  04/07/07 MRI brain - multiple bilateral lesions consistent with multiple sclerosis; no enhancing lesions; atrophy  04/11/14 MRI brain (with and without) (I reviewed images myself and agree with interpretation) 1. Mild round and ovoid periventricular and subcortical chronic demyelinating plaques. 2. No acute plaques. 3. Moderate subcortical and severe corpus callosum atrophy.    ASSESSMENT AND PLAN  54 y.o. year old male here with advanced multiple sclerosis, bedbound with severe BLE contractures. Having continued side effects from betaseron and intolerance to frequent injections. Discussed plegridy vs tecfidera vs tysabri. Will try tecfidera.   PLAN:  - change betaseron to tecfidera - continue dantrolene, tizanidine, gabapentin, provigil - HH PT/OT through [facility]  Return in about 6 months (around 12/13/2014).    Penni Bombard, MD 1/41/0301, 3:14 PM Certified in Neurology, Neurophysiology and Neuroimaging  [facility] [address], [place], [place] [address] [phone]

## 2014-06-28 ENCOUNTER — Telehealth: Payer: Self-pay | Admitting: Diagnostic Neuroimaging

## 2014-06-28 NOTE — Telephone Encounter (Signed)
Chanel from [facility] is calling asking that the nurse call the patient and have the patient call her back with a caretaker or someone else that can speak on the patient's behalf so that she can give the pt some information regarding his medication.  She does not a HIPPA form on file to discuss information with anyone other than the pt.   She just ask that the nurse reaches out to the pt to have them call Chanel @ [facility] [place] [facility] @ [phone].  If you need more info you are welcome to call her back.

## 2014-06-29 ENCOUNTER — Telehealth: Payer: Self-pay | Admitting: *Deleted

## 2014-06-29 NOTE — Telephone Encounter (Signed)
Spoke with the PCP Shirlee Limerick on the phone and asked her to call [name] Mccoy from [facility] at [phone]. I explained that [name] Mccoy needed to talk with her and the pt regarding some questions.  Shirlee Limerick said she would call.

## 2014-07-10 ENCOUNTER — Telehealth: Payer: Self-pay | Admitting: Diagnostic Neuroimaging

## 2014-07-10 NOTE — Telephone Encounter (Signed)
[name] Mccoy with [facility] @ [phone], option 5, has questions regarding diagnosis for hospitalization.  Please call and advise.

## 2014-07-13 NOTE — Telephone Encounter (Signed)
Velta Addison is calling back about this patient. Please call [phone], option 5

## 2014-07-17 ENCOUNTER — Telehealth: Payer: Self-pay | Admitting: *Deleted

## 2014-07-17 NOTE — Telephone Encounter (Signed)
Called and left message for [name] Mccoy, asked her to call back and leave me a detailed message regarding what it was that she needed from me. When she calls please find out dates, hospitalizations and what she needs so I can be prepared to talk with her.

## 2014-07-18 NOTE — Telephone Encounter (Signed)
[name] Mccoy returned call and stated you informed her there were no recent hospitalization records available.  [name] Mccoy requested PCP name and number.  I provider [name] Mccoy with PCP information.  FYI

## 2014-07-24 ENCOUNTER — Telehealth: Payer: Self-pay | Admitting: Diagnostic Neuroimaging

## 2014-07-24 MED ORDER — DIMETHYL FUMARATE 240 MG PO CPDR: 240.0000 mg | DELAYED_RELEASE_CAPSULE | Freq: Two times a day (BID) | ORAL | Status: DC

## 2014-07-24 NOTE — Telephone Encounter (Signed)
It appears the patient has [facility] as his pharmacy.  Rx has been sent.

## 2014-07-24 NOTE — Telephone Encounter (Signed)
[facility] is calling to let us know that they are not able to fill Rx Tecfidera.  This Rx  will need to call to with proper Specialty Pharmacy to fill Rx. Thanks!

## 2014-08-07 ENCOUNTER — Telehealth: Payer: Self-pay | Admitting: Diagnostic Neuroimaging

## 2014-08-07 NOTE — Telephone Encounter (Signed)
Pharmacy is calling stating pt needs prior auth for Dimethyl Fumarate (TECFIDERA) 240 MG CPDR for medicaid, form was faxed over.  Please advise.

## 2014-08-07 NOTE — Telephone Encounter (Signed)
Ins has been contacted.  Request is under review Ref Key: [id number]

## 2014-08-15 NOTE — Telephone Encounter (Signed)
Request has been sent to ins Ref Key: [id number].  I called back and spoke with Melissa.  She is aware.

## 2014-08-15 NOTE — Telephone Encounter (Signed)
Melissa from [facility] is calling stating that a prior auth was sent for the starter pack for Tecfidera, but was not sent for Tecfidera 240mg .  Please call and advise @ [phone] Ext. [phone].

## 2014-08-16 NOTE — Telephone Encounter (Signed)
Medicaid responded to request stating they have already approved coverage for Tecfidera 240mg  effective until 08/03/2015, no further action is required.  A copy of this info has been faxed to [facility] at [phone].

## 2014-10-30 ENCOUNTER — Telehealth: Payer: Self-pay | Admitting: Diagnostic Neuroimaging

## 2014-10-30 NOTE — Telephone Encounter (Signed)
Patients caregiver([name]) called regarding Dimethyl Fumarate (TECFIDERA) 240 MG CPDR .He is in the 3rd month.  She states patient is having diarrhea and stomach irritation x 1 month. Patient states he has not noticed any changes with this medication in reference to MS. She can be reached @ [phone].

## 2014-10-30 NOTE — Telephone Encounter (Signed)
I called back.  Got no answer.  Left message.  

## 2014-10-31 NOTE — Telephone Encounter (Signed)
I called back.  Got no answer.  Left message relaying providers note.

## 2014-10-31 NOTE — Telephone Encounter (Signed)
Needs to go to PCP for evaluation of GI upset. Not likely related to tecfidera this long after starting. -VRP

## 2014-10-31 NOTE — Telephone Encounter (Signed)
Caregiver indicated the patient has been taking Tecfidera for the past few months and he has developed GI Upset, diarrhea for the past 4 weeks.  Questioning if he should continue this medication, or if something different is recommended.  Please advise.  Thank you.

## 2014-11-09 NOTE — Telephone Encounter (Signed)
[name] Mccoy patient's caregiver called stating yesterday he had bloody stool. She thinks it is the [place]. His bottom is red and sore. Please call and advise. She can be reached [phone].

## 2014-11-09 NOTE — Telephone Encounter (Signed)
I called back.  Spoke with Caregiver.  Inquired if she had consulted PCP regarding GI issues.  Says she has not, as she is adamant it is from [place].  Says the patient has had bloody stool 3-4 times since starting the medication, with the last one being yesterday, and feels this is a side effect as well.  Says the patient does not wish to change meds, but questioning if they should try to reduce dose.  Also would like to know if labs are needed.  Says someone came to the home in April for labs, but she does not think they were able to collect enough blood.  Please advise.  Thank you.

## 2014-12-18 ENCOUNTER — Encounter: Payer: Self-pay | Admitting: Diagnostic Neuroimaging

## 2014-12-18 ENCOUNTER — Ambulatory Visit (INDEPENDENT_AMBULATORY_CARE_PROVIDER_SITE_OTHER): Payer: Medicaid Other | Admitting: Diagnostic Neuroimaging

## 2014-12-18 ENCOUNTER — Telehealth: Payer: Self-pay

## 2014-12-18 VITALS — BP 117/89 | HR 99

## 2014-12-18 DIAGNOSIS — G35 Multiple sclerosis: Secondary | ICD-10-CM | POA: Diagnosis not present

## 2014-12-18 NOTE — Progress Notes (Signed)
PATIENT: [name] Mccoy. Markov DOB: [birth year]  REASON FOR VISIT: routine MS follow up HISTORY FROM: patient and caregiver (with transporters)  HISTORY OF PRESENT ILLNESS:  UPDATE 12/18/14: Since last visit, cannot tolerate tecfidera due to severe diarrhea and skin breakdown. No blood per rectum/stool as indicated in phone notes; actually the blood is related to the skin breakdown from frequent diarrhea. Also, they need help for prior authorization for dantrolene.   UPDATE 06/14/14: Since last visit, doing about the same. Had MRI and labs done. Ready to switch to tecfidera vs plegdridy.  UPDATE 03/13/14: No new MS symptoms. Doing better than last visit (more mobile). Wounds have healed nicely. Tolerating betaseron, but with intermittent flu like rxn and diff with skin scarring and lack of injection sites. Spasms are stable and controlled with medications.  UPDATE 09/09/13 (LL):  Patient returns for follow up, accompanied by caregiver and POA.  He has been doing some better since last visit; decubitus ulcers have healed by 75% with wound vac therapy.  He reports increasing fine motor skill loss in his left hand which is the dominant.  He has not been abe to get Provigil or Gabapentin through Medicaid.  POA hopes to have Provigil paid for through his trust fund.  Caregiver states Medicaid required PA with Gabapentin - not sure why it wasn't covered.  He had Urosepsis in Feb and was hospitalized and now has foley changed monthly at [facility].  Tolerating Betaseron well.  UPDATE 03/07/13 (VP): Since last visit with Dr. love in January 2014 patient has had multiple issues including some type of side effect related to Botox injection in March 2014 (resulting in bilateral hip pain and muscle spasm), kidney stone and urinary tract infection, hospitalization, diagnosed with bilateral hip fractures on x-ray. Patient's hip and hamstring contractures have continued to worsen. He has severe decubitus ulcers. He has  gone through rehabilitation but now is living back at home. He is accompanied by a caregiver who has been working with him since the past 8-12 months. Patient is no longer able to sit in a wheelchair for transport. He has to be transported to office visits by ambulance and stretcher. Patient continues to take Betaseron. Occasionally he has flulike reactions.   PRIOR HPI (05/31/12, Dr. Sandria Manly): 54 -year-old left-handed African American single male with multiple sclerosis characterized by gait disorder beginning at age 59 in 89 when he presented with double vision. Intermittently he has taken steroids and as been on Betaseron therapy since 1999. He received Novantrone 11/2002, 03/2003, 05/2003, 06/2003, and 08/2003. He was on monthly IV SoluMedrol beginning in November 2008 and noticed improvement with that medication but I cannot objectively document it. 06/2011 he developed "sprains in his hips" with soreness in his groin region bilaterally. He had increased stiffness and inability to walk since then. He was in a wheelchair at home living by himself or in bed.He was not walking or standing. He was admitted to [facility] center March through June 2013 with decubitus ulcers and received PT. He has been home. 01/09/2012 and 03/16/2012, he developed left hand weakness with inability to write and I gave him 2 courses of IV Solu-Medrol followed by by mouth prednisone with improvement. 12/25/2011 CBC and CMP were normal except elevated alk phos. 01/27/2012 he had evidence of urinary tract infection. He comes in in a wheelchair. He cannot stand or take steps with assitance because of severe left greater than right leg weakness and hamstring contractions. He denies bowel or bladder  dysfunction or Lhermittes sign. He has not had any falls. 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 His last bone density study 10/21/2010 showed low bone mass with osteopenia. His last MRI scan of the cervical spine with and without contrast 04/07/2007 was abnormal showing mild diffuse degenerative disc disease, with central canal stenosis at C4-5, diffuse neuroforaminal stenosis left greater than right at C5-6 and no cord lesions. Last MRI of the brain 04/09/2007 showed bilateral lesions consistent with the diagnosis of MS. He last walked 06/2011. He requires assistance in dressing, bathing, taking care of his toileting needs and bowel habits.He has not fallen. He denies double vision, swallowing problems, slurred speech, or Lhermitte's sign He has home health aides from 10 a.m. to 6 p.m. and 10 p.m. to 2 a.m. He has not seen Dr. Gladstone Lighter for evaluation of his painful hips. X-rays 07/10/2011 showed no acute bony abnormality in the pelvis. He had Botox injection 04/29/2012 to the lower extremities with minimal improvement He is a candidate for motorized wheelchair because he cannot stand or walk. He requires assistance in activities of daily living. He cannot use a manual wheelchair because of left arm weakness. With a transfer board he can transfer to a motorized wheelchair. He needs significant back support because of scoliosis. A scooter would not suffice. He has mental and physical abilities to operate a wheelchair. He has not walked since 06/2011. He only uses a wheelchair. He cannot stand. A motorized wheelchair will allow transportation in his single-story home and improve his ability to do activities of daily living. He can get through the doorways to the bathroom.   REVIEW OF SYSTEMS: Full 14 system review of systems performed and notable only for fatigue trouble swallowing light sensitivity heat intolerance daytime sleepiness rash wounds itching.   ALLERGIES: No Known Allergies  HOME MEDICATIONS: Outpatient Prescriptions Prior to Visit  Medication Sig Dispense Refill  .  amantadine (SYMMETREL) 100 MG capsule Take 100 mg by mouth 2 (two) times daily.     . dantrolene (DANTRIUM) 25 MG capsule Take 25 mg by mouth 3 (three) times daily.    . Dimethyl Fumarate (TECFIDERA) 240 MG CPDR Take 1 capsule (240 mg total) by mouth 2 (two) times daily. 60 capsule 11  . feeding supplement (ENSURE COMPLETE) LIQD Take 237 mLs by mouth 3 (three) times daily between meals.    . gabapentin (NEURONTIN) 300 MG capsule TAKE 2 CAPSULES 4 TIMES DAILY. 240 capsule 12  . HYDROcodone-acetaminophen (NORCO/VICODIN) 5-325 MG per tablet Take 2 tablets by mouth every 6 (six) hours as needed for moderate pain. Takes it about 90mins before home health care worker moves his legs. Also q6h prn    . hydrOXYzine (ATARAX/VISTARIL) 25 MG tablet   0  . Interferon Beta-1b (BETASERON) 0.3 MG KIT injection INJECT 0.25MG              SUBCUTANEOUSLY EVERY      OTHER DAY 14 kit 12  . metoprolol succinate (TOPROL-XL) 25 MG 24 hr tablet Take 25 mg by mouth daily.    . Multiple Vitamin (MULTIVITAMIN WITH MINERALS) TABS Take 1 tablet by mouth daily.    Marland Kitchen oxybutynin (DITROPAN) 5 MG tablet Take 5 mg by mouth 3 (three) times daily.    Marland Kitchen oxyCODONE-acetaminophen (PERCOCET) 10-325 MG per tablet   0  . simvastatin (ZOCOR) 20  MG tablet Take 20 mg by mouth daily. 1 @@ night    . tiZANidine (ZANAFLEX) 4 MG tablet Take 4 mg by mouth 4 (four) times daily.     . montelukast (SINGULAIR) 10 MG tablet   0  . ciprofloxacin (CIPRO) 500 MG tablet Take 1 tablet (500 mg total) by mouth 2 (two) times daily. 14 tablet 0  . oxyCODONE-acetaminophen (PERCOCET/ROXICET) 5-325 MG per tablet Take by mouth every 4 (four) hours as needed for severe pain.     No facility-administered medications prior to visit.     PHYSICAL EXAM  Filed Vitals:   12/18/14 1502  BP: 117/89  Pulse: 99   There is no weight on file to calculate BMI.  GENERAL EXAM: Patient is in no distress  CARDIOVASCULAR: Regular rate and rhythm, no murmurs, no carotid  bruits    NEUROLOGIC:  MENTAL STATUS: awake, alert, language fluent, comprehension intact, naming intact  CRANIAL NERVE: pupils equal and reactive to light, visual fields full to confrontation, extraocular muscles intact, no nystagmus, facial sensation and strength symmetric, uvula midline, shoulder shrug symmetric, tongue midline.  MOTOR: BUE ATROPHY (4/5). LUE INCOORDINATION. BILATERAL LEGS SEVERE HIP AND HAMSTRING CONTRACTURES WITH LEFTWARD ROTATION. RLE 1/5 PROX AND 2-3 DISTALLY. LLE 1/5 IN TOES ONLY. SENSORY: normal and symmetric to light touch; SLIGHTLY REDUCED IN LEFT FOOT COORDINATION: SLIGHT ATAXIA IN LUE REFLEXES: BUE 2, KNEES 2.  GAIT/STATION: LAYING ON TRANSPORT STRETCHER   DIAGNOSTICS/IMAGING   Ff  Lab Results  Component Value Date   WBC 5.7 07/16/2013   HGB 11.3* 07/16/2013   HCT 35.8* 07/16/2013   MCV 81.2 07/16/2013   PLT 242 07/16/2013   CMP Latest Ref Rng 07/16/2013 07/13/2013 04/16/2013  Glucose 70 - 99 mg/dL 99 98 93  BUN 6 - 23 mg/dL $Remove'9 19 7  'FVXzeHB$ Creatinine 0.50 - 1.35 mg/dL 0.51 0.76 0.56  Sodium 137 - 147 mEq/L 134(L) 133(L) 137  Potassium 3.7 - 5.3 mEq/L 4.2 4.4 3.5  Chloride 96 - 112 mEq/L 97 97 103  CO2 19 - 32 mEq/L $Remove'26 19 26  'wydmYhm$ Calcium 8.4 - 10.5 mg/dL 8.8 8.7 8.7  Total Protein 6.0 - 8.3 g/dL - - -  Total Bilirubin 0.3 - 1.2 mg/dL - - -  Alkaline Phos 39 - 117 U/L - - -  AST 0 - 37 U/L - - -  ALT 0 - 53 U/L - - -    04/07/07 MRI cervical spine - mild degenerative disease C3-4 to C5-6; mild spinal stenosis at C4-5; no cord lesions.  04/07/07 MRI brain - multiple bilateral lesions consistent with multiple sclerosis; no enhancing lesions; atrophy  04/11/14 MRI brain (with and without) (I reviewed images myself and agree with interpretation) 1. Mild round and ovoid periventricular and subcortical chronic demyelinating plaques. 2. No acute plaques. 3. Moderate subcortical and severe corpus callosum atrophy.    ASSESSMENT AND PLAN  54 y.o. year old  male here with advanced multiple sclerosis, bedbound with severe BLE contractures. Had continued side effects from betaseron and intolerance to frequent injections. Tried tecfidera but had too much GI side effects. Now will switch to plegridy.  PLAN:  - change tecfidera to plegridy - continue dantrolene, tizanidine, gabapentin, provigil - HH PT/OT through [facility]  Return in about 6 months (around 06/20/2015).    Penni Bombard, MD 12/23/6809, 5:72 PM Certified in Neurology, Neurophysiology and Neuroimaging  [facility] [address] [place], [place] [address] [phone]

## 2014-12-18 NOTE — Telephone Encounter (Signed)
Medicaid has been contacted and provided with clinical info regarding Dantrolene, asking that they grant an exception for this medication.  Request is currently under review.  Ref # [id number]

## 2014-12-20 NOTE — Telephone Encounter (Signed)
Medicaid has approved the request for coverage on Dantrolene effective until 12/18/2014 Ref # [id number]

## 2014-12-28 ENCOUNTER — Telehealth: Payer: Self-pay | Admitting: Diagnostic Neuroimaging

## 2014-12-28 NOTE — Telephone Encounter (Signed)
All info was already forwarded to ins, pending response.

## 2014-12-28 NOTE — Telephone Encounter (Signed)
[name] with [name] is calling to get prior authorization for medication Plegridy. [name] states she has already faxed a form requesting it. Thank you.

## 2014-12-29 NOTE — Telephone Encounter (Signed)
Medicaid has approved the request for coverage on Plegrity Starter Dose Effective for 30 days Ref # [id number]; Plegridy Maint Dose effective until 12/23/2015 Ref # [id number]

## 2015-01-15 ENCOUNTER — Telehealth: Payer: Self-pay | Admitting: Diagnostic Neuroimaging
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
## 2015-01-16 NOTE — Telephone Encounter (Signed)
Spoke with Santiago Glad, [employer] to inform her the fax has not been received. She stated that [facility] has not processed the request yet. She apologized for calling "too soon". She stated this office should receive fax tomorrow.

## 2015-01-23 ENCOUNTER — Telehealth: Payer: Self-pay | Admitting: Diagnostic Neuroimaging

## 2015-01-23 NOTE — Telephone Encounter (Signed)
Returned [employer], verified that  [facility]  replacement was sent to patient on 01/18/15.

## 2015-01-23 NOTE — Telephone Encounter (Signed)
Informed by Olegario Shearer with [employer] that patient's Plegridy replacement was sent last week.

## 2015-01-23 NOTE — Telephone Encounter (Signed)
Santiago Glad with [employer] is calling to update you on patient's Rx Plegridy.  Please call @ [phone].

## 2015-01-23 NOTE — Telephone Encounter (Signed)
Spoke with Santiago Glad, [phone] and advised her this office has not received fax from [facility], re: Plegridy. She stated she had spoken with a Patient Care coordinator last week and would call again today. She states she also plans to call patient's caregiver today or tomorrow. She stated she would call this RN back after talking with [employer] and getting update on Plegridy replacement.

## 2015-02-21 ENCOUNTER — Telehealth: Payer: Self-pay | Admitting: Diagnostic Neuroimaging

## 2015-02-21 NOTE — Telephone Encounter (Signed)
Melvenia Needles RN Educator with [facility] called stating the pt is to get full dose of Plegrity shipment from pharmacy this Friday. She states the pt's caregiver Shirlee Limerick is inquiring if pt can take full dose when he gets it this Friday, 125 mcg instead of waiting for replacement dose which is 94 mcg which will take a few days. Please call and advise. She can be reached at [phone].

## 2015-02-21 NOTE — Telephone Encounter (Signed)
Left vm for Shirlee Limerick, per Dr [name], to go ahead and give patient 125 mcg dose. Left this caller's name, number for questions. Spoke with Santiago Glad, [employer] and informed. She stated she would touch base with Shirlee Limerick as well.
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
## 2015-02-21 NOTE — Telephone Encounter (Signed)
Go ahead with 172mcg dose. -VRP

## 2015-03-18 ENCOUNTER — Encounter (HOSPITAL_COMMUNITY): Payer: Self-pay

## 2015-03-18 ENCOUNTER — Emergency Department (HOSPITAL_COMMUNITY): Payer: Medicaid Other

## 2015-03-18 ENCOUNTER — Emergency Department (HOSPITAL_COMMUNITY)
Admission: EM | Admit: 2015-03-18 | Discharge: 2015-03-18 | Disposition: A | Discharge status: Home / Self Care | Payer: Medicaid Other | Attending: Emergency Medicine | Admitting: Emergency Medicine

## 2015-03-18 DIAGNOSIS — Z87448 Personal history of other diseases of urinary system: Secondary | ICD-10-CM | POA: Diagnosis not present

## 2015-03-18 DIAGNOSIS — G35 Multiple sclerosis: Secondary | ICD-10-CM | POA: Insufficient documentation

## 2015-03-18 DIAGNOSIS — R2243 Localized swelling, mass and lump, lower limb, bilateral: Secondary | ICD-10-CM | POA: Insufficient documentation

## 2015-03-18 DIAGNOSIS — R5383 Other fatigue: Secondary | ICD-10-CM

## 2015-03-18 DIAGNOSIS — Z872 Personal history of diseases of the skin and subcutaneous tissue: Secondary | ICD-10-CM | POA: Insufficient documentation

## 2015-03-18 DIAGNOSIS — Z87442 Personal history of urinary calculi: Secondary | ICD-10-CM | POA: Diagnosis not present

## 2015-03-18 DIAGNOSIS — Z79899 Other long term (current) drug therapy: Secondary | ICD-10-CM | POA: Insufficient documentation

## 2015-03-18 DIAGNOSIS — R4182 Altered mental status, unspecified: Secondary | ICD-10-CM | POA: Diagnosis present

## 2015-03-18 DIAGNOSIS — Z8639 Personal history of other endocrine, nutritional and metabolic disease: Secondary | ICD-10-CM | POA: Diagnosis not present

## 2015-03-18 LAB — URINALYSIS, ROUTINE W REFLEX MICROSCOPIC
BILIRUBIN URINE: NEGATIVE
GLUCOSE, UA: NEGATIVE mg/dL
KETONES UR: NEGATIVE mg/dL
Nitrite: NEGATIVE
PH: 7 (ref 5.0–8.0)
PROTEIN: NEGATIVE mg/dL
Specific Gravity, Urine: 1.003 — ABNORMAL LOW (ref 1.005–1.030)
Urobilinogen, UA: 0.2 mg/dL (ref 0.0–1.0)

## 2015-03-18 LAB — CBC WITH DIFFERENTIAL/PLATELET
Basophils Absolute: 0 10*3/uL (ref 0.0–0.1)
Basophils Relative: 0 %
EOS ABS: 0.3 10*3/uL (ref 0.0–0.7)
Eosinophils Relative: 3 %
HEMATOCRIT: 42.8 % (ref 39.0–52.0)
HEMOGLOBIN: 14.7 g/dL (ref 13.0–17.0)
LYMPHS ABS: 1.4 10*3/uL (ref 0.7–4.0)
LYMPHS PCT: 14 %
MCH: 26.3 pg (ref 26.0–34.0)
MCHC: 34.3 g/dL (ref 30.0–36.0)
MCV: 76.4 fL — AB (ref 78.0–100.0)
MONOS PCT: 8 %
Monocytes Absolute: 0.8 10*3/uL (ref 0.1–1.0)
NEUTROS ABS: 7.4 10*3/uL (ref 1.7–7.7)
NEUTROS PCT: 75 %
Platelets: 401 10*3/uL — ABNORMAL HIGH (ref 150–400)
RBC: 5.6 MIL/uL (ref 4.22–5.81)
RDW: 14.9 % (ref 11.5–15.5)
WBC: 10 10*3/uL (ref 4.0–10.5)

## 2015-03-18 LAB — COMPREHENSIVE METABOLIC PANEL
ALK PHOS: 103 U/L (ref 38–126)
ALT: 27 U/L (ref 17–63)
AST: 37 U/L (ref 15–41)
Albumin: 3.7 g/dL (ref 3.5–5.0)
Anion gap: 11 (ref 5–15)
BILIRUBIN TOTAL: 1.5 mg/dL — AB (ref 0.3–1.2)
BUN: 13 mg/dL (ref 6–20)
CALCIUM: 8.8 mg/dL — AB (ref 8.9–10.3)
CO2: 19 mmol/L — AB (ref 22–32)
CREATININE: 0.8 mg/dL (ref 0.61–1.24)
Chloride: 103 mmol/L (ref 101–111)
GFR calc non Af Amer: >60 mL/min (ref >60)
GLUCOSE: 103 mg/dL — AB (ref 65–99)
Potassium: 3.6 mmol/L (ref 3.5–5.1)
SODIUM: 133 mmol/L — AB (ref 135–145)
TOTAL PROTEIN: 8.5 g/dL — AB (ref 6.5–8.1)

## 2015-03-18 LAB — I-STAT CG4 LACTIC ACID, ED
LACTIC ACID, VENOUS: 0.72 mmol/L (ref 0.5–2.0)
Lactic Acid, Venous: 0.82 mmol/L (ref 0.5–2.0)

## 2015-03-18 LAB — URINE MICROSCOPIC-ADD ON

## 2015-03-18 MED ORDER — SODIUM CHLORIDE 0.9 % IV BOLUS (SEPSIS)
1000.0000 mL | Freq: Once | INTRAVENOUS | Status: AC
  Administered 2015-03-18: 1000 mL via INTRAVENOUS

## 2015-03-18 NOTE — ED Provider Notes (Signed)
CSN: [id number]     Arrival date & time 03/18/15  1016 History   First MD Initiated Contact with Patient 03/18/15 1017     Chief Complaint  Patient presents with  . Altered Mental Status     (Consider location/radiation/quality/duration/timing/severity/associated sxs/prior Treatment) HPI Comments: 54 y.o. Male with history of MS presents with his caregiver for change in mental status.  The caregiver reports the patient has not been himself - more tired, less interactive, and at times appeared confused.  About 1 week ago they were informed patient's urine looked infected on UA but patient asymptomatic at that time and with indwelling foley catheter so no antibiotics initiated.  Patient has not had fever, abdominal pain, nausea, or vomiting.  Patient is able to answer questions and does report that he feels tired but otherwise denies any complaints.  Patient and caregiver also report that these symptoms are similar to when his MS acts up and that this often occurs during times of change in weather such as now.   Past Medical History  Diagnosis Date  . Multiple sclerosis   . Pressure ulcer, buttock(707.05)   . Pain in joint, pelvic region and thigh   . Spasm of muscle   . Unspecified vitamin D deficiency   . Disturbance of skin sensation   . Right ureteral stone 01/03/13  . Renal stones   . Neurogenic bladder     01/13/13 FOLEY CATH  . Urinary retention with incomplete bladder emptying 01/13/13    FOLEY CATHETER  . Neuromuscular disorder     multiple schlorosis since 1983  . Hyponatremia 01/27/2013  . DNR (do not resuscitate)     yellow copy   Past Surgical History  Procedure Laterality Date  . Wound debridement Right 10/15/2012    Procedure: DEBRIDMENT OF RIGHT HIP DECUBITUS AND SACRAL DECUBITUS;  Surgeon: Earnstine Regal, MD;  Location: [facility];  Service: General;  Laterality: Right;  left lateral position, right hip  . Nephrolithotomy Right 01/25/2013    Procedure: RIGHT PERCUTANEOUS  NEPHROLITHOTOMY ;  Surgeon: Malka So, MD;  Location: [facility];  Service: Urology;  Laterality: Right;  . Kidney stone surgery      x 2   Family History  Problem Relation Age of Onset  . Hypertension Mother   . Dementia Mother   . Hypertension Father   . Heart disease Father    Social History  Substance Use Topics  . Smoking status: Never Smoker   . Smokeless tobacco: Never Used  . Alcohol Use: No    Review of Systems  Constitutional: Positive for fatigue. Negative for fever, diaphoresis and appetite change.  HENT: Negative for congestion, postnasal drip and sneezing.   Eyes: Negative for discharge.  Respiratory: Negative for cough, chest tightness and shortness of breath.   Cardiovascular: Negative for chest pain and palpitations.  Gastrointestinal: Negative for nausea, vomiting, abdominal pain and diarrhea.  Genitourinary: Negative for hematuria, decreased urine volume and discharge.       Sediment noted in urine  Musculoskeletal: Negative for myalgias and back pain.  Skin: Negative for rash.  Neurological: Negative for seizures, syncope and headaches.  Hematological: Does not bruise/bleed easily.      Allergies  Review of patient's allergies indicates no known allergies.  Home Medications   Prior to Admission medications   Medication Sig Start Date End Date Taking? Authorizing Provider  amantadine (SYMMETREL) 100 MG capsule Take 100 mg by mouth 2 (two) times daily.  Historical Provider, MD  cetirizine (ZYRTEC) 10 MG tablet  12/12/14   Historical Provider, MD  dantrolene (DANTRIUM) 25 MG capsule Take 25 mg by mouth 3 (three) times daily.    Historical Provider, MD  Dimethyl Fumarate (TECFIDERA) 240 MG CPDR Take 1 capsule (240 mg total) by mouth 2 (two) times daily. 07/24/14   Penni Bombard, MD  feeding supplement (ENSURE COMPLETE) LIQD Take 237 mLs by mouth 3 (three) times daily between meals. 09/21/12   Modena Jansky, MD  gabapentin (NEURONTIN) 300 MG capsule  TAKE 2 CAPSULES 4 TIMES DAILY. 04/07/14   Philmore Pali, NP  HYDROcodone-acetaminophen (NORCO/VICODIN) 5-325 MG per tablet Take 2 tablets by mouth every 6 (six) hours as needed for moderate pain. Takes it about 6mins before home health care worker moves his legs. Also q6h prn 10/25/12   Tiffany L Reed, DO  hydrOXYzine (ATARAX/VISTARIL) 25 MG tablet  05/16/14   Historical Provider, MD  Interferon Beta-1b (BETASERON) 0.3 MG KIT injection INJECT 0.25MG              SUBCUTANEOUSLY EVERY      OTHER DAY 09/09/13   Philmore Pali, NP  metoprolol succinate (TOPROL-XL) 25 MG 24 hr tablet Take 25 mg by mouth daily. 01/28/13   Irine Seal, MD  montelukast (SINGULAIR) 10 MG tablet  05/16/14   Historical Provider, MD  Multiple Vitamin (MULTIVITAMIN WITH MINERALS) TABS Take 1 tablet by mouth daily. 09/21/12   Modena Jansky, MD  nystatin (MYCOSTATIN) 100000 UNIT/ML suspension SWISH AND SWALLOW 5 MILLILITERS FOUR TIMES A DAY FOR 5 DAYS 12/06/14   Historical Provider, MD  ondansetron (ZOFRAN) 4 MG tablet take 1 tablet by mouth three times a day AS NEEDED FOR NAUSEA. 12/16/14   Historical Provider, MD  oxybutynin (DITROPAN) 5 MG tablet Take 5 mg by mouth 3 (three) times daily.    Historical Provider, MD  oxyCODONE-acetaminophen (PERCOCET) 10-325 MG per tablet  06/07/14   Historical Provider, MD  simvastatin (ZOCOR) 20 MG tablet Take 20 mg by mouth daily. 1 @@ night    Historical Provider, MD  tiZANidine (ZANAFLEX) 4 MG tablet Take 4 mg by mouth 4 (four) times daily.     Historical Provider, MD  triamcinolone (KENALOG) 0.025 % cream  12/12/14   Historical Provider, MD   BP 121/72 mmHg  Pulse 101  Temp(Src) 97.7 F (36.5 C) (Rectal)  Resp 14  SpO2 94% Physical Exam  Constitutional: He is oriented to person, place, and time. No distress.  HENT:  Head: Normocephalic and atraumatic.  Right Ear: External ear normal.  Left Ear: External ear normal.  Mouth/Throat: Oropharynx is clear and moist.  Eyes: EOM are normal. Pupils are  equal, round, and reactive to light.  Neck: Normal range of motion. Neck supple.  Cardiovascular: Normal rate, regular rhythm and normal heart sounds.   No murmur heard. Pulmonary/Chest: Effort normal. No respiratory distress. He has no wheezes. He has no rales.  Abdominal: Soft. He exhibits no distension. There is no tenderness. There is no rebound.  Genitourinary:  Foley catheter in place with mostly clear yellow urine although some sediment noted in the bottom of the bag  Musculoskeletal: He exhibits edema (mild/trace in the bilateral lower extremities).  Contracted lower extremities  Neurological: He is alert and oriented to person, place, and time.  Skin: Skin is warm and dry. He is not diaphoretic.  Vitals reviewed.   ED Course  Procedures (including critical care time) Labs Review Labs Reviewed  URINE CULTURE  CBC WITH DIFFERENTIAL/PLATELET  COMPREHENSIVE METABOLIC PANEL  URINALYSIS, ROUTINE W REFLEX MICROSCOPIC (NOT AT [facility])  I-STAT CG4 LACTIC ACID, ED    Imaging Review No results found. I have personally reviewed and evaluated these images and lab results as part of my medical decision-making.   EKG Interpretation None      MDM   Patient seen and evaluated in stable condition.  Vitals stable.  Patient oriented and not confused on examination.  Labs unremarkable.  UA not consistent with infection.  Patient was given IV fluids and on reexamination patient more talkative and per caregiver patient now at his baseline and the two of them are watching football and joking with each other.  Patient discharged in stable condition with strict return precautions and instructions to follow up outpatient. Final diagnoses:  None    1. Fatigue    Harvel Quale, MD 03/20/15 [phone]

## 2015-03-18 NOTE — ED Notes (Signed)
His caregiver informs [place] that pt. Has been confused since Fri.  She states that in the past when he has had this symptom we have dx with uti.  He is awake, attentive and in no distress.  His mobility is quite limited d/t m.s. And he arrives with chronic foley cath. In place which is draining clear dark amber urine.  He is aware of his situation and is able to tell us he is not in any pain at  present.

## 2015-03-18 NOTE — Discharge Instructions (Signed)
Fatigue  Fatigue is feeling tired all of the time, a lack of energy, or a lack of motivation. Occasional or mild fatigue is often a normal response to activity or life in general. However, long-lasting (chronic) or extreme fatigue may indicate an underlying medical condition.  HOME CARE INSTRUCTIONS   Watch your fatigue for any changes. The following actions may help to lessen any discomfort you are feeling:  · Talk to your health care provider about how much sleep you need each night. Try to get the required amount every night.  · Take medicines only as directed by your health care provider.  · Eat a healthy and nutritious diet. Ask your health care provider if you need help changing your diet.  · Drink enough fluid to keep your urine clear or pale yellow.  · Practice ways of relaxing, such as yoga, meditation, massage therapy, or acupuncture.  · Exercise regularly.    · Change situations that cause you stress. Try to keep your work and personal routine reasonable.  · Do not abuse illegal drugs.  · Limit alcohol intake to no more than 1 drink per day for nonpregnant women and 2 drinks per day for men. One drink equals 12 ounces of beer, 5 ounces of wine, or 1½ ounces of hard liquor.  · Take a multivitamin, if directed by your health care provider.  SEEK MEDICAL CARE IF:   · Your fatigue does not get better.  · You have a fever.    · You have unintentional weight loss or gain.  · You have headaches.    · You have difficulty:      Falling asleep.    Sleeping throughout the night.  · You feel angry, guilty, anxious, or sad.     · You are unable to have a bowel movement (constipation).    · You skin is dry.     · Your legs or another part of your body is swollen.    SEEK IMMEDIATE MEDICAL CARE IF:   · You feel confused.    · Your vision is blurry.  · You feel faint or pass out.    · You have a severe headache.    · You have severe abdominal, pelvic, or back pain.    · You have chest pain, shortness of breath, or an  irregular or fast heartbeat.    · You are unable to urinate or you urinate less than normal.    · You develop abnormal bleeding, such as bleeding from the rectum, vagina, nose, lungs, or nipples.  · You vomit blood.     · You have thoughts about harming yourself or committing suicide.    · You are worried that you might harm someone else.       This information is not intended to replace advice given to you by your health care provider. Make sure you discuss any questions you have with your health care provider.     Document Released: 03/02/2007 Document Revised: 05/26/2014 Document Reviewed: 09/06/2013  Elsevier Interactive Patient Education ©2016 [employer]

## 2015-03-18 NOTE — ED Notes (Signed)
PTAR here and he is transferred out of our department without incident.  His caregiver/P.O.A. Remains with him.

## 2015-03-18 NOTE — ED Notes (Signed)
He remains in no distress. He has asked for (and received) several glasses of wter, given to him by his caregiver.  He had a formed brown stool about an hour ago.

## 2015-03-19 LAB — URINE CULTURE

## 2015-04-16 ENCOUNTER — Telehealth: Payer: Self-pay | Admitting: Diagnostic Neuroimaging

## 2015-04-16 NOTE — Telephone Encounter (Signed)
Melvenia Needles RN educator with [employer] called sts Shirlee Limerick caregiver called her sts he is having vomiting which is dark and grainey.  Last Plegridy inj was 04/09/15 and Shirlee Limerick sts after injection he became nauseated with vomiting intermittently until this Saturday 04/14/15. She called PCP and was told it could be thrombocytapenia and is going to order CBC. Please call Santiago Glad at [phone] before 4:30 today or will be available tomorrow.

## 2015-04-17 ENCOUNTER — Emergency Department (HOSPITAL_COMMUNITY): Payer: Medicaid Other

## 2015-04-17 ENCOUNTER — Encounter (HOSPITAL_COMMUNITY): Payer: Self-pay | Admitting: Emergency Medicine

## 2015-04-17 ENCOUNTER — Inpatient Hospital Stay (HOSPITAL_COMMUNITY)
Admission: EM | Admit: 2015-04-17 | Discharge: 2015-04-27 | DRG: 871 | Disposition: A | Discharge status: Home Health | Payer: Medicaid Other | Attending: Family Medicine | Admitting: Family Medicine

## 2015-04-17 DIAGNOSIS — K2970 Gastritis, unspecified, without bleeding: Secondary | ICD-10-CM | POA: Diagnosis present

## 2015-04-17 DIAGNOSIS — E872 Acidosis, unspecified: Secondary | ICD-10-CM | POA: Diagnosis present

## 2015-04-17 DIAGNOSIS — K3189 Other diseases of stomach and duodenum: Secondary | ICD-10-CM

## 2015-04-17 DIAGNOSIS — Z6826 Body mass index (BMI) 26.0-26.9, adult: Secondary | ICD-10-CM

## 2015-04-17 DIAGNOSIS — L89323 Pressure ulcer of left buttock, stage 3: Secondary | ICD-10-CM | POA: Diagnosis present

## 2015-04-17 DIAGNOSIS — Z79899 Other long term (current) drug therapy: Secondary | ICD-10-CM

## 2015-04-17 DIAGNOSIS — N390 Urinary tract infection, site not specified: Secondary | ICD-10-CM | POA: Diagnosis present

## 2015-04-17 DIAGNOSIS — K6389 Other specified diseases of intestine: Secondary | ICD-10-CM

## 2015-04-17 DIAGNOSIS — G35 Multiple sclerosis: Secondary | ICD-10-CM | POA: Diagnosis present

## 2015-04-17 DIAGNOSIS — N319 Neuromuscular dysfunction of bladder, unspecified: Secondary | ICD-10-CM | POA: Diagnosis present

## 2015-04-17 DIAGNOSIS — E162 Hypoglycemia, unspecified: Secondary | ICD-10-CM | POA: Diagnosis present

## 2015-04-17 DIAGNOSIS — M62838 Other muscle spasm: Secondary | ICD-10-CM | POA: Diagnosis present

## 2015-04-17 DIAGNOSIS — D638 Anemia in other chronic diseases classified elsewhere: Secondary | ICD-10-CM | POA: Diagnosis present

## 2015-04-17 DIAGNOSIS — M87851 Other osteonecrosis, right femur: Secondary | ICD-10-CM | POA: Diagnosis present

## 2015-04-17 DIAGNOSIS — M24551 Contracture, right hip: Secondary | ICD-10-CM | POA: Diagnosis present

## 2015-04-17 DIAGNOSIS — A419 Sepsis, unspecified organism: Principal | ICD-10-CM | POA: Diagnosis present

## 2015-04-17 DIAGNOSIS — E876 Hypokalemia: Secondary | ICD-10-CM | POA: Diagnosis present

## 2015-04-17 DIAGNOSIS — R198 Other specified symptoms and signs involving the digestive system and abdomen: Secondary | ICD-10-CM

## 2015-04-17 DIAGNOSIS — G8220 Paraplegia, unspecified: Secondary | ICD-10-CM | POA: Diagnosis present

## 2015-04-17 DIAGNOSIS — Z7401 Bed confinement status: Secondary | ICD-10-CM

## 2015-04-17 DIAGNOSIS — L89153 Pressure ulcer of sacral region, stage 3: Secondary | ICD-10-CM | POA: Diagnosis present

## 2015-04-17 DIAGNOSIS — R112 Nausea with vomiting, unspecified: Secondary | ICD-10-CM | POA: Diagnosis present

## 2015-04-17 DIAGNOSIS — K668 Other specified disorders of peritoneum: Secondary | ICD-10-CM | POA: Diagnosis present

## 2015-04-17 DIAGNOSIS — R6520 Severe sepsis without septic shock: Secondary | ICD-10-CM | POA: Diagnosis present

## 2015-04-17 DIAGNOSIS — L89309 Pressure ulcer of unspecified buttock, unspecified stage: Secondary | ICD-10-CM | POA: Diagnosis present

## 2015-04-17 DIAGNOSIS — E43 Unspecified severe protein-calorie malnutrition: Secondary | ICD-10-CM | POA: Diagnosis present

## 2015-04-17 DIAGNOSIS — M24552 Contracture, left hip: Secondary | ICD-10-CM | POA: Diagnosis present

## 2015-04-17 DIAGNOSIS — Z66 Do not resuscitate: Secondary | ICD-10-CM | POA: Diagnosis present

## 2015-04-17 DIAGNOSIS — L89893 Pressure ulcer of other site, stage 3: Secondary | ICD-10-CM | POA: Diagnosis present

## 2015-04-17 DIAGNOSIS — Z8249 Family history of ischemic heart disease and other diseases of the circulatory system: Secondary | ICD-10-CM

## 2015-04-17 LAB — URINE MICROSCOPIC-ADD ON

## 2015-04-17 LAB — CBC WITH DIFFERENTIAL/PLATELET
Basophils Absolute: 0 10*3/uL (ref 0.0–0.1)
Basophils Relative: 0 %
EOS ABS: 0 10*3/uL (ref 0.0–0.7)
EOS PCT: 0 %
HCT: 38.8 % — ABNORMAL LOW (ref 39.0–52.0)
Hemoglobin: 12.4 g/dL — ABNORMAL LOW (ref 13.0–17.0)
LYMPHS ABS: 1.4 10*3/uL (ref 0.7–4.0)
Lymphocytes Relative: 10 %
MCH: 25.2 pg — AB (ref 26.0–34.0)
MCHC: 32 g/dL (ref 30.0–36.0)
MCV: 78.9 fL (ref 78.0–100.0)
Monocytes Absolute: 1.2 10*3/uL — ABNORMAL HIGH (ref 0.1–1.0)
Monocytes Relative: 9 %
Neutro Abs: 10.9 10*3/uL — ABNORMAL HIGH (ref 1.7–7.7)
Neutrophils Relative %: 81 %
PLATELETS: 234 10*3/uL (ref 150–400)
RBC: 4.92 MIL/uL (ref 4.22–5.81)
RDW: 15.7 % — ABNORMAL HIGH (ref 11.5–15.5)
WBC: 13.4 10*3/uL — AB (ref 4.0–10.5)

## 2015-04-17 LAB — URINALYSIS, ROUTINE W REFLEX MICROSCOPIC
Glucose, UA: NEGATIVE mg/dL
KETONES UR: NEGATIVE mg/dL
Nitrite: NEGATIVE
PROTEIN: 100 mg/dL — AB
Specific Gravity, Urine: 1.024 (ref 1.005–1.030)
pH: 8 (ref 5.0–8.0)

## 2015-04-17 LAB — COMPREHENSIVE METABOLIC PANEL
ALK PHOS: 60 U/L (ref 38–126)
ALT: 42 U/L (ref 17–63)
AST: 42 U/L — AB (ref 15–41)
Albumin: 2.6 g/dL — ABNORMAL LOW (ref 3.5–5.0)
Anion gap: 10 (ref 5–15)
BILIRUBIN TOTAL: 0.9 mg/dL (ref 0.3–1.2)
BUN: 33 mg/dL — AB (ref 6–20)
CHLORIDE: 116 mmol/L — AB (ref 101–111)
CO2: 19 mmol/L — ABNORMAL LOW (ref 22–32)
CREATININE: 0.75 mg/dL (ref 0.61–1.24)
Calcium: 6.3 mg/dL — CL (ref 8.9–10.3)
GFR calc Af Amer: >60 mL/min (ref >60)
Glucose, Bld: 119 mg/dL — ABNORMAL HIGH (ref 65–99)
Potassium: 2.7 mmol/L — CL (ref 3.5–5.1)
Sodium: 145 mmol/L (ref 135–145)
TOTAL PROTEIN: 5.8 g/dL — AB (ref 6.5–8.1)

## 2015-04-17 LAB — CK: Total CK: 24 U/L — ABNORMAL LOW (ref 49–397)

## 2015-04-17 LAB — POC OCCULT BLOOD, ED: Fecal Occult Bld: POSITIVE — AB

## 2015-04-17 LAB — TYPE AND SCREEN
ABO/RH(D): A POS
ANTIBODY SCREEN: NEGATIVE

## 2015-04-17 LAB — LIPASE, BLOOD: LIPASE: 18 U/L (ref 11–51)

## 2015-04-17 LAB — I-STAT CG4 LACTIC ACID, ED
Lactic Acid, Venous: 3.05 mmol/L (ref 0.5–2.0)
Lactic Acid, Venous: 4.86 mmol/L (ref 0.5–2.0)

## 2015-04-17 MED ORDER — SODIUM CHLORIDE 0.9 % IV BOLUS (SEPSIS)
1000.0000 mL | Freq: Once | INTRAVENOUS | Status: AC
  Administered 2015-04-17: 1000 mL via INTRAVENOUS

## 2015-04-17 MED ORDER — MAGNESIUM SULFATE 2 GM/50ML IV SOLN
2.0000 g | INTRAVENOUS | Status: AC
  Administered 2015-04-17: 2 g via INTRAVENOUS
  Filled 2015-04-17: qty 50

## 2015-04-17 MED ORDER — DEXTROSE 5 % IV SOLN
1.0000 g | Freq: Once | INTRAVENOUS | Status: AC
  Administered 2015-04-17: 1 g via INTRAVENOUS
  Filled 2015-04-17: qty 10

## 2015-04-17 MED ORDER — FAMOTIDINE IN NACL 20-0.9 MG/50ML-% IV SOLN
20.0000 mg | INTRAVENOUS | Status: AC
  Administered 2015-04-17: 20 mg via INTRAVENOUS
  Filled 2015-04-17: qty 50

## 2015-04-17 MED ORDER — POTASSIUM CHLORIDE 10 MEQ/100ML IV SOLN
10.0000 meq | Freq: Once | INTRAVENOUS | Status: AC
  Administered 2015-04-17: 10 meq via INTRAVENOUS
  Filled 2015-04-17: qty 100

## 2015-04-17 MED ORDER — PANTOPRAZOLE SODIUM 40 MG IV SOLR
40.0000 mg | INTRAVENOUS | Status: AC
  Administered 2015-04-17: 40 mg via INTRAVENOUS
  Filled 2015-04-17: qty 40

## 2015-04-17 MED ORDER — FENTANYL CITRATE (PF) 100 MCG/2ML IJ SOLN
50.0000 ug | Freq: Once | INTRAMUSCULAR | Status: AC
  Administered 2015-04-17: 50 ug via INTRAVENOUS
  Filled 2015-04-17: qty 2

## 2015-04-17 MED ORDER — VANCOMYCIN HCL IN DEXTROSE 1-5 GM/200ML-% IV SOLN
1000.0000 mg | Freq: Once | INTRAVENOUS | Status: AC
  Administered 2015-04-17: 1000 mg via INTRAVENOUS
  Filled 2015-04-17: qty 200

## 2015-04-17 MED ORDER — IOHEXOL 300 MG/ML  SOLN
100.0000 mL | Freq: Once | INTRAMUSCULAR | Status: AC | PRN
  Administered 2015-04-17: 100 mL via INTRAVENOUS

## 2015-04-17 MED ORDER — ONDANSETRON HCL 4 MG/2ML IJ SOLN
4.0000 mg | Freq: Once | INTRAMUSCULAR | Status: AC
  Administered 2015-04-17: 4 mg via INTRAVENOUS
  Filled 2015-04-17: qty 2

## 2015-04-17 NOTE — Consult Note (Signed)
[facility] CON[facility] Reason for consult: CG Emesis  Referring Physician:  ER Perley Jain K. Seth is an 54 y.o. male.  HPI: Brief note due to Dragon nonfunctional. Please see ER MD note for details of history. Pt with MS with indwelling foley and chronic UTI. History from pt and caregiver, no prior history of PUD, GI bleed or EGD. CG emesis 2 days ago and today with melenic stool in diaper . No NSAIDS no abdominal pain. Multiple problems related to MS.  Past Medical History  Diagnosis Date  . Multiple sclerosis ([place])   . Pressure ulcer, buttock(707.05)   . Pain in joint, pelvic region and thigh   . Spasm of muscle   . Unspecified vitamin D deficiency   . Disturbance of skin sensation   . Right ureteral stone 01/03/13  . Renal stones   . Neurogenic bladder     01/13/13 [facility] CATH  . Urinary retention with incomplete bladder emptying 01/13/13    [facility] CATHETER  . Neuromuscular disorder ([place])     multiple schlorosis since 1983  . Hyponatremia 01/27/2013  . DNR (do not resuscitate)     yellow copy    Past Surgical History  Procedure Laterality Date  . Wound debridement Right 10/15/2012    Procedure: DEBRIDMENT OF RIGHT HIP DECUBITUS AND SACRAL DECUBITUS;  Surgeon: Earnstine Regal, MD;  Location: [facility];  Service: General;  Laterality: Right;  left lateral position, right hip  . Nephrolithotomy Right 01/25/2013    Procedure: RIGHT PERCUTANEOUS NEPHROLITHOTOMY ;  Surgeon: Malka So, MD;  Location: [facility];  Service: Urology;  Laterality: Right;  . Kidney stone surgery      x 2    Family History  Problem Relation Age of Onset  . Hypertension Mother   . Dementia Mother   . Hypertension Father   . Heart disease Father     Social History:  reports that he has never smoked. He has never used smokeless tobacco. He reports that he does not drink alcohol or use illicit drugs.  Allergies: No Known Allergies  Medications; Prior to Admission medications   Medication Sig  Start Date End Date Taking? Authorizing Provider  amantadine (SYMMETREL) 100 MG capsule Take 100 mg by mouth 2 (two) times daily.    Yes Historical Provider, MD  cetirizine (ZYRTEC) 10 MG tablet  12/12/14  Yes Historical Provider, MD  dantrolene (DANTRIUM) 25 MG capsule Take 25 mg by mouth 3 (three) times daily.   Yes Historical Provider, MD  feeding supplement (ENSURE COMPLETE) LIQD Take 237 mLs by mouth 3 (three) times daily between meals. 09/21/12  Yes Modena Jansky, MD  gabapentin (NEURONTIN) 300 MG capsule TAKE 2 CAPSULES 4 TIMES DAILY. 04/07/14  Yes Philmore Pali, NP  gluconic acid-citric acid (RENACIDIN) irrigation Irrigate with 30 mLs as directed as directed. Every 1-3 days for foley. 03/08/15  Yes Historical Provider, MD  hydrOXYzine (ATARAX/VISTARIL) 25 MG tablet Take 25 mg by mouth every 4 (four) hours as needed for anxiety or itching.  05/16/14  Yes Historical Provider, MD  metoprolol succinate (TOPROL-XL) 25 MG 24 hr tablet Take 25 mg by mouth daily. 01/28/13  Yes Irine Seal, MD  montelukast (SINGULAIR) 10 MG tablet Take 10 mg by mouth every morning.  05/16/14  Yes Historical Provider, MD  Multiple Vitamin (MULTIVITAMIN WITH MINERALS) TABS Take 1 tablet by mouth daily. 09/21/12  Yes Modena Jansky, MD  nystatin (MYCOSTATIN) 100000 UNIT/ML suspension SWISH AND SWALLOW 5 MILLILITERS  FOUR TIMES A DAY FOR 5 DAYS 12/06/14  Yes Historical Provider, MD  ondansetron (ZOFRAN) 4 MG tablet take 1 tablet by mouth three times a day AS NEEDED FOR NAUSEA. 12/16/14  Yes Historical Provider, MD  oxybutynin (DITROPAN) 5 MG tablet Take 5 mg by mouth 3 (three) times daily.   Yes Historical Provider, MD  oxyCODONE-acetaminophen (PERCOCET) 10-325 MG per tablet Take 1 tablet by mouth every 6 (six) hours as needed for pain.  06/07/14  Yes Historical Provider, MD  Peginterferon Beta-1a (PLEGRIDY) 125 MCG/0.5ML SOPN Inject 125 mcg into the skin every 14 (fourteen) days.   Yes Historical Provider, MD  simvastatin (ZOCOR)  20 MG tablet Take 20 mg by mouth at bedtime.    Yes Historical Provider, MD  tiZANidine (ZANAFLEX) 4 MG tablet Take 4 mg by mouth 4 (four) times daily.    Yes Historical Provider, MD  triamcinolone (KENALOG) 0.025 % cream  12/12/14  Yes Historical Provider, MD    PRN Meds  Results for orders placed or performed during the hospital encounter of 04/17/15 (from the past 48 hour(s))  Urinalysis, Routine w reflex microscopic (not at [facility])     Status: Abnormal   Collection Time: 04/17/15  5:34 PM  Result Value Ref Range   Color, Urine YELLOW YELLOW   APPearance TURBID (A) CLEAR   Specific Gravity, Urine 1.024 1.005 - 1.030   pH 8.0 5.0 - 8.0   Glucose, UA NEGATIVE NEGATIVE mg/dL   Hgb urine dipstick LARGE (A) NEGATIVE   Bilirubin Urine SMALL (A) NEGATIVE   Ketones, ur NEGATIVE NEGATIVE mg/dL   Protein, ur 100 (A) NEGATIVE mg/dL   Nitrite NEGATIVE NEGATIVE   Leukocytes, UA LARGE (A) NEGATIVE  Urine microscopic-add on     Status: Abnormal   Collection Time: 04/17/15  5:34 PM  Result Value Ref Range   Squamous Epithelial / LPF 0-5 (A) NONE SEEN   WBC, UA TOO NUMEROUS TO COUNT 0 - 5 WBC/hpf   RBC / HPF TOO NUMEROUS TO COUNT 0 - 5 RBC/hpf   Bacteria, UA MANY (A) NONE SEEN   Crystals TRIPLE PHOSPHATE CRYSTALS (A) NEGATIVE    Comment: BILIRUBIN CRYSTALS  Comprehensive metabolic panel     Status: Abnormal   Collection Time: 04/17/15  5:48 PM  Result Value Ref Range   Sodium 145 135 - 145 mmol/L   Potassium 2.7 (LL) 3.5 - 5.1 mmol/L    Comment: CRITICAL RESULT CALLED TO, READ BACK BY AND VERIFIED WITH: WEST S RN AT 1855 ON 11.29.16 BY MENDOZA B    Chloride 116 (H) 101 - 111 mmol/L   CO2 19 (L) 22 - 32 mmol/L   Glucose, Bld 119 (H) 65 - 99 mg/dL   BUN 33 (H) 6 - 20 mg/dL   Creatinine, Ser 0.75 0.61 - 1.24 mg/dL   Calcium 6.3 (LL) 8.9 - 10.3 mg/dL    Comment: CRITICAL RESULT CALLED TO, READ BACK BY AND VERIFIED WITH: WEST S RN AT 1855 ON 11.29.16 BY MENDOZA B    Total Protein 5.8 (L)  6.5 - 8.1 g/dL   Albumin 2.6 (L) 3.5 - 5.0 g/dL   AST 42 (H) 15 - 41 U/L   ALT 42 17 - 63 U/L   Alkaline Phosphatase 60 38 - 126 U/L   Total Bilirubin 0.9 0.3 - 1.2 mg/dL   GFR calc non Af Amer >60 >60 mL/min   GFR calc Af Amer >60 >60 mL/min    Comment: (NOTE) The eGFR has  been calculated using the CKD EPI equation. This calculation has not been validated in all clinical situations. eGFR's persistently <60 mL/min signify possible Chronic Kidney Disease.    Anion gap 10 5 - 15  CBC WITH DIFFERENTIAL     Status: Abnormal   Collection Time: 04/17/15  5:48 PM  Result Value Ref Range   WBC 13.4 (H) 4.0 - 10.5 K/uL   RBC 4.92 4.22 - 5.81 MIL/uL   Hemoglobin 12.4 (L) 13.0 - 17.0 g/dL   HCT 38.8 (L) 39.0 - 52.0 %   MCV 78.9 78.0 - 100.0 fL   MCH 25.2 (L) 26.0 - 34.0 pg   MCHC 32.0 30.0 - 36.0 g/dL   RDW 15.7 (H) 11.5 - 15.5 %   Platelets 234 150 - 400 K/uL   Neutrophils Relative % 81 %   Neutro Abs 10.9 (H) 1.7 - 7.7 K/uL   Lymphocytes Relative 10 %   Lymphs Abs 1.4 0.7 - 4.0 K/uL   Monocytes Relative 9 %   Monocytes Absolute 1.2 (H) 0.1 - 1.0 K/uL   Eosinophils Relative 0 %   Eosinophils Absolute 0.0 0.0 - 0.7 K/uL   Basophils Relative 0 %   Basophils Absolute 0.0 0.0 - 0.1 K/uL  Lipase, blood     Status: None   Collection Time: 04/17/15  5:48 PM  Result Value Ref Range   Lipase 18 11 - 51 U/L  CK     Status: Abnormal   Collection Time: 04/17/15  5:48 PM  Result Value Ref Range   Total CK 24 (L) 49 - 397 U/L  I-Stat CG4 Lactic Acid, ED  (not at  [facility])     Status: Abnormal   Collection Time: 04/17/15  5:56 PM  Result Value Ref Range   Lactic Acid, Venous 3.05 (HH) 0.5 - 2.0 mmol/L   Comment NOTIFIED PHYSICIAN   POC occult blood, ED RN will collect     Status: Abnormal   Collection Time: 04/17/15  5:56 PM  Result Value Ref Range   Fecal Occult Bld POSITIVE (A) NEGATIVE  Type and screen     Status: None   Collection Time: 04/17/15  6:25 PM  Result Value Ref Range    ABO/RH(D) A POS    Antibody Screen NEG    Sample Expiration 04/20/2015   I-Stat CG4 Lactic Acid, ED     Status: Abnormal   Collection Time: 04/17/15  8:13 PM  Result Value Ref Range   Lactic Acid, Venous 4.86 (HH) 0.5 - 2.0 mmol/L    Dg Chest Port 1 View  04/17/2015  CLINICAL DATA:  Upper GI bleed, hematemesis EXAM: PORTABLE CHEST 1 VIEW COMPARISON:  03/18/2015 FINDINGS: Limited inspiratory effect. Uncoiling of the aorta. Normal heart size. Moderate diffuse interstitial change stable from prior study. There is gas in the left upper quadrant which is only partially visualized, which may be entirely within the stomach. Possibility of left pneumoperitoneum not excluded. IMPRESSION: 1. Chronic interstitial lung disease 2. CT of the abdomen and pelvis recommended in light of clinical information. Pneumoperitoneum not excluded in the left upper abdomen. Electronically Signed   By: Raymond  Rubner M.D.   On: 04/17/2015 18:15               Blood pressure 99/71, pulse 139, temperature 101.4 F (38.6 C), temperature source Rectal, resp. rate 22, weight 93.441 kg (206 lb), SpO2 97 %.  Physical exam:   General--alert male anwers appropriately  ENT--MM moist  Heart--RRR Lungs--clear Abdomen--completely   nontender    Assessment: 1. Hematemesis. ? Ulcer denies NSAIDs  2. UTI 3. MS with multiple related problems.  Plan: Would give IV Protonix empirically  Will proceed with EGD in AM probably by Dr Penelope Coop. Discussed with pt and caregiver. Time to be determined.    Aylla Huffine JR,Sokhna Christoph L 04/17/2015, 8:56 PM   Pager: [phone] If no answer or after hours call [phone]

## 2015-04-17 NOTE — ED Provider Notes (Signed)
CSN: [id number]     Arrival date & time 04/17/15  1651 History   First MD Initiated Contact with Patient 04/17/15 1719     Chief Complaint  Patient presents with  . Altered Mental Status     (Consider location/radiation/quality/duration/timing/severity/associated sxs/prior Treatment) HPI Comments: The patient is a chronically ill gentleman of the age of 54, he has a history of multiple sclerosis, he has multiple pressure breakdown ulcers on his right hip and buttock, he has a history of kidney stones and a neurogenic bladder for which she has a Foley catheter indwelling for the last 2 years. He has hyponatremia chronically and is DO NOT RESUSCITATE by orders. The patient has had several days of intermittent nausea, generalized weakness and has been vomiting coffee-ground emesis. According to the report of the patient and the caregiver there is no history of upper GI bleeding. The patient does not take anticoagulants, and does not take any medications that would predispose him to stomach ulcers. He is however bedbound and in a supine position 24 hours a day.  Patient is a 54 y.o. male presenting with altered mental status. The history is provided by the patient and a caregiver.  Altered Mental Status   Past Medical History  Diagnosis Date  . Multiple sclerosis ([place])   . Pressure ulcer, buttock(707.05)   . Pain in joint, pelvic region and thigh   . Spasm of muscle   . Unspecified vitamin D deficiency   . Disturbance of skin sensation   . Right ureteral stone 01/03/13  . Renal stones   . Neurogenic bladder     01/13/13 FOLEY CATH  . Urinary retention with incomplete bladder emptying 01/13/13    FOLEY CATHETER  . Neuromuscular disorder ([place])     multiple schlorosis since 1983  . Hyponatremia 01/27/2013  . DNR (do not resuscitate)     yellow copy   Past Surgical History  Procedure Laterality Date  . Wound debridement Right 10/15/2012    Procedure: DEBRIDMENT OF RIGHT HIP DECUBITUS AND  SACRAL DECUBITUS;  Surgeon: Earnstine Regal, MD;  Location: [facility];  Service: General;  Laterality: Right;  left lateral position, right hip  . Nephrolithotomy Right 01/25/2013    Procedure: RIGHT PERCUTANEOUS NEPHROLITHOTOMY ;  Surgeon: Malka So, MD;  Location: [facility];  Service: Urology;  Laterality: Right;  . Kidney stone surgery      x 2   Family History  Problem Relation Age of Onset  . Hypertension Mother   . Dementia Mother   . Hypertension Father   . Heart disease Father    Social History  Substance Use Topics  . Smoking status: Never Smoker   . Smokeless tobacco: Never Used  . Alcohol Use: No    Review of Systems    Allergies  Review of patient's allergies indicates no known allergies.  Home Medications   Prior to Admission medications   Medication Sig Start Date End Date Taking? Authorizing Provider  amantadine (SYMMETREL) 100 MG capsule Take 100 mg by mouth 2 (two) times daily.    Yes Historical Provider, MD  cetirizine (ZYRTEC) 10 MG tablet  12/12/14  Yes Historical Provider, MD  dantrolene (DANTRIUM) 25 MG capsule Take 25 mg by mouth 3 (three) times daily.   Yes Historical Provider, MD  feeding supplement (ENSURE COMPLETE) LIQD Take 237 mLs by mouth 3 (three) times daily between meals. 09/21/12  Yes Modena Jansky, MD  gabapentin (NEURONTIN) 300 MG capsule TAKE 2 CAPSULES 4  TIMES DAILY. 04/07/14  Yes Philmore Pali, NP  gluconic acid-citric acid (RENACIDIN) irrigation Irrigate with 30 mLs as directed as directed. Every 1-3 days for foley. 03/08/15  Yes Historical Provider, MD  hydrOXYzine (ATARAX/VISTARIL) 25 MG tablet Take 25 mg by mouth every 4 (four) hours as needed for anxiety or itching.  05/16/14  Yes Historical Provider, MD  metoprolol succinate (TOPROL-XL) 25 MG 24 hr tablet Take 25 mg by mouth daily. 01/28/13  Yes Irine Seal, MD  montelukast (SINGULAIR) 10 MG tablet Take 10 mg by mouth every morning.  05/16/14  Yes Historical Provider, MD  Multiple Vitamin  (MULTIVITAMIN WITH MINERALS) TABS Take 1 tablet by mouth daily. 09/21/12  Yes Modena Jansky, MD  nystatin (MYCOSTATIN) 100000 UNIT/ML suspension SWISH AND SWALLOW 5 MILLILITERS FOUR TIMES A DAY FOR 5 DAYS 12/06/14  Yes Historical Provider, MD  ondansetron (ZOFRAN) 4 MG tablet take 1 tablet by mouth three times a day AS NEEDED FOR NAUSEA. 12/16/14  Yes Historical Provider, MD  oxybutynin (DITROPAN) 5 MG tablet Take 5 mg by mouth 3 (three) times daily.   Yes Historical Provider, MD  oxyCODONE-acetaminophen (PERCOCET) 10-325 MG per tablet Take 1 tablet by mouth every 6 (six) hours as needed for pain.  06/07/14  Yes Historical Provider, MD  Peginterferon Beta-1a (PLEGRIDY) 125 MCG/0.5ML SOPN Inject 125 mcg into the skin every 14 (fourteen) days.   Yes Historical Provider, MD  simvastatin (ZOCOR) 20 MG tablet Take 20 mg by mouth at bedtime.    Yes Historical Provider, MD  tiZANidine (ZANAFLEX) 4 MG tablet Take 4 mg by mouth 4 (four) times daily.    Yes Historical Provider, MD  triamcinolone (KENALOG) 0.025 % cream  12/12/14  Yes Historical Provider, MD   BP 108/84 mmHg  Pulse 144  Temp(Src) 98.8 F (37.1 C) (Oral)  Resp 16  Wt 206 lb (93.441 kg)  SpO2 93% Physical Exam  Constitutional:  The patient does not appear in distress however he is very tachycardic  HENT:  Head: Normocephalic and atraumatic.  Mouth/Throat: Oropharynx is clear and moist. No oropharyngeal exudate.  Moist mucous membranes, no coffee-ground emesis or bleeding in the mouth or nose  Eyes: Conjunctivae and EOM are normal. Pupils are equal, round, and reactive to light. Right eye exhibits no discharge. Left eye exhibits no discharge. No scleral icterus.  Neck: Normal range of motion. Neck supple. No JVD present. No thyromegaly present.  Cardiovascular: Regular rhythm, normal heart sounds and intact distal pulses.  Exam reveals no gallop and no friction rub.   No murmur heard. Tachycardic to 140 bpm in a regular sinus rhythm   Pulmonary/Chest: Effort normal and breath sounds normal. No respiratory distress. He has no wheezes. He has no rales.  Abdominal: Soft. Bowel sounds are normal. He exhibits no distension and no mass. There is no tenderness.  Soft nontender abdomen  Musculoskeletal: Normal range of motion. He exhibits no edema or tenderness.  Pressure ulcers over the right hip sacrum and back, flexion contractures of all 4 extremities  Lymphadenopathy:    He has no cervical adenopathy.  Neurological: He is alert. Coordination normal.  , Speech is normal, alert and oriented, follows commands  Skin: Skin is warm and dry. No rash noted. No erythema.  Psychiatric: He has a normal mood and affect. His behavior is normal.  Nursing note and vitals reviewed.   ED Course  Procedures (including critical care time) Labs Review Labs Reviewed  COMPREHENSIVE METABOLIC PANEL - Abnormal; Notable for  the following:    Potassium 2.7 (*)    Chloride 116 (*)    CO2 19 (*)    Glucose, Bld 119 (*)    BUN 33 (*)    Calcium 6.3 (*)    Total Protein 5.8 (*)    Albumin 2.6 (*)    AST 42 (*)    All other components within normal limits  CBC WITH DIFFERENTIAL/PLATELET - Abnormal; Notable for the following:    WBC 13.4 (*)    Hemoglobin 12.4 (*)    HCT 38.8 (*)    MCH 25.2 (*)    RDW 15.7 (*)    Neutro Abs 10.9 (*)    Monocytes Absolute 1.2 (*)    All other components within normal limits  URINALYSIS, ROUTINE W REFLEX MICROSCOPIC (NOT AT [facility]) - Abnormal; Notable for the following:    APPearance TURBID (*)    Hgb urine dipstick LARGE (*)    Bilirubin Urine SMALL (*)    Protein, ur 100 (*)    Leukocytes, UA LARGE (*)    All other components within normal limits  CK - Abnormal; Notable for the following:    Total CK 24 (*)    All other components within normal limits  URINE MICROSCOPIC-ADD ON - Abnormal; Notable for the following:    Squamous Epithelial / LPF 0-5 (*)    Bacteria, UA MANY (*)    Crystals TRIPLE  PHOSPHATE CRYSTALS (*)    All other components within normal limits  I-STAT CG4 LACTIC ACID, ED - Abnormal; Notable for the following:    Lactic Acid, Venous 3.05 (*)    All other components within normal limits  I-STAT CG4 LACTIC ACID, ED - Abnormal; Notable for the following:    Lactic Acid, Venous 4.86 (*)    All other components within normal limits  POC OCCULT BLOOD, ED - Abnormal; Notable for the following:    Fecal Occult Bld POSITIVE (*)    All other components within normal limits  CULTURE, BLOOD (ROUTINE X 2)  CULTURE, BLOOD (ROUTINE X 2)  URINE CULTURE  LIPASE, BLOOD  OCCULT BLOOD GASTRIC / DUODENUM (SPECIMEN CUP)  PROTIME-INR  APTT  I-STAT CG4 LACTIC ACID, ED  I-STAT CG4 LACTIC ACID, ED  TYPE AND SCREEN    Imaging Review Ct Abdomen Pelvis W Contrast  04/17/2015  CLINICAL DATA:  Vomiting blood, tachycardia, altered mental status, possible viscus perforation EXAM: CT ABDOMEN AND PELVIS WITH CONTRAST TECHNIQUE: Multidetector CT imaging of the abdomen and pelvis was performed using the standard protocol following bolus administration of intravenous contrast. CONTRAST:  158mL OMNIPAQUE IOHEXOL 300 MG/ML  SOLN COMPARISON:  01/26/2013 FINDINGS: Lower chest: Interstitial change at both lung bases possibly representing pulmonary edema. There is motion artifact. Pneumonia/pneumonitis not excluded. Hepatobiliary: There is some gallbladder wall calcification as well as sludge similar to prior study Pancreas: Normal Spleen: Normal Adrenals/Urinary Tract: Adrenal glands are normal. A few small left renal cysts are present. There is a Foley catheter decompressing the bladder. Stomach/Bowel: Stomach is distended with air-fluid level. There is air in the wall of the stomach and there is pneumoperitoneum around the stomach. Fluid refluxes into the distended distal esophagus. No significant abnormalities of small or large bowel, which are relatively decompressed when compared to the stomach.  Vascular/Lymphatic: No acute findings Reproductive: No acute abnormalities Other: Air extends through the esophageal hiatus into the lower mediastinum, although a greater volume of free air is seen below the diaphragm. Musculoskeletal: Limited evaluation due to suboptimal  positioning. Evidence of prior deformed left femur fracture. 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 Evidence of perforated viscus with pneumoperitoneum left upper quadrant.Pneumatosis can be seen as evidence of wall necrosis related to ischemia or emphysematous gastritis; however, the gastric wall does not appear thickened currently and the possibility that pneumatotics may be of mechanical origin is considered. Bilateral lower lobe infiltrates suggesting edema or possibly pneumonia/pneumonitis Critical Value/emergent results were called by telephone at the time of interpretation on 04/17/2015 at 9:41 pm to Dr. Noemi Chapel , who verbally acknowledged these results. Electronically Signed   By: Skipper Cliche M.D.   On: 04/17/2015 21:41   Dg Chest Port 1 View  04/17/2015  CLINICAL DATA:  Upper GI bleed, hematemesis EXAM: PORTABLE CHEST 1 VIEW COMPARISON:  03/18/2015 FINDINGS: Limited inspiratory effect. Uncoiling of the aorta. Normal heart size. Moderate diffuse interstitial change stable from prior study. There is gas in the left upper quadrant which is only partially visualized, which may be entirely within the stomach. Possibility of left pneumoperitoneum not excluded. IMPRESSION: 1. Chronic interstitial lung disease 2. CT of the abdomen and pelvis recommended in light of clinical information. Pneumoperitoneum not excluded in the left upper abdomen. Electronically Signed   By: Skipper Cliche M.D.   On: 04/17/2015 18:15   I have personally reviewed and evaluated these images and lab results as part of my medical decision-making.   EKG Interpretation   Date/Time:  Tuesday April 17 2015 17:32:54  EST Ventricular Rate:  134 PR Interval:  137 QRS Duration: 86 QT Interval:  327 QTC Calculation: 488 R Axis:   67 Text Interpretation:  Sinus tachycardia Abnormal R-wave progression, early  transition Borderline repolarization abnormality Borderline prolonged QT  interval Abnormal ekg Since last tracing rate faster Confirmed by [place] ([id number]) on 04/17/2015 5:48:53 PM      MDM   Final diagnoses:  Perforated viscus  UTI (lower urinary tract infection)  Sepsis, due to unspecified organism [facility])    The patient is limited based on his chronic multiple sclerosis however and his mental status is normal however he does appear to be having some abnormal-appearing vomit which is coffee-ground in appearance. We'll check gastric old, labs, treat for possible upper GI bleed with Pepcid Protonix and IV fluids. He is not hypotensive at this time but is very tachycardic. According to the caregiver he does have periods where he is very tachycardic and hypotensive at his baseline  Discussed with Dr. Hassell Done at 9:45 PM, he will come to see the patient. Discussed with Dr. [name] who will see the pt and consider ICU placement.  Discussed with radiology at 9:40 PM, the patient has a likely perforated viscus, this is possibly the gastroesophageal junction, the patient is critically ill, he has an elevated lactate which continues to rise, elevated white blood cell count, urinary tract infection, his tachycardia continues to increase despite fluid resuscitation. I suspect the patient has a critical illness however general surgery has been consult did.   CRITICAL CARE Performed by: Johnna Acosta Total critical care time: 35 minutes Critical care time was exclusive of separately billable procedures and treating other patients. Critical care was necessary to treat or prevent imminent or life-threatening deterioration. Critical care was time spent personally by me on the following activities:  development of treatment plan with patient and/or surrogate as well as nursing, discussions with consultants, evaluation of patient's response to treatment, examination of patient, obtaining history from patient or surrogate,  ordering and performing treatments and interventions, ordering and review of laboratory studies, ordering and review of radiographic studies, pulse oximetry and re-evaluation of patient's condition.   Meds given in ED:  Medications  sodium chloride 0.9 % bolus 1,000 mL (not administered)  ondansetron (ZOFRAN) injection 4 mg (4 mg Intravenous Given 04/17/15 1833)  sodium chloride 0.9 % bolus 1,000 mL (0 mLs Intravenous Stopped 04/17/15 1920)  sodium chloride 0.9 % bolus 1,000 mL (0 mLs Intravenous Stopped 04/17/15 2053)  famotidine (PEPCID) IVPB 20 mg premix (0 mg Intravenous Stopped 04/17/15 1859)  pantoprazole (PROTONIX) injection 40 mg (40 mg Intravenous Given 04/17/15 1833)  cefTRIAXone (ROCEPHIN) 1 g in dextrose 5 % 50 mL IVPB (0 g Intravenous Stopped 04/17/15 2053)  potassium chloride 10 mEq in 100 mL IVPB (0 mEq Intravenous Stopped 04/17/15 2247)  magnesium sulfate IVPB 2 g 50 mL (0 g Intravenous Stopped 04/17/15 2247)  iohexol (OMNIPAQUE) 300 MG/ML solution 100 mL (100 mLs Intravenous Contrast Given 04/17/15 2109)  vancomycin (VANCOCIN) IVPB 1000 mg/200 mL premix (0 mg Intravenous Stopped 04/17/15 2256)  fentaNYL (SUBLIMAZE) injection 50 mcg (50 mcg Intravenous Given 04/17/15 2153)  sodium chloride 0.9 % bolus 1,000 mL (1,000 mLs Intravenous New Bag/Given 04/17/15 2346)     Noemi Chapel, MD 04/18/15 0020

## 2015-04-17 NOTE — ED Notes (Addendum)
PT BIB EMS; per EMS, pt vomited twice since 3pm; vomit appeared dark brown and was covering the pt and had soaked the sheet beneath him; pt is cold to the touch with a heart rate of 150; pt has altered mental status which is abnormal; pt has received 600 mL in ambulance; CBG 161. Pressure 122/73; pt on 2L [place] with O2  Of 97%.

## 2015-04-17 NOTE — Consult Note (Signed)
Chief Complaint:  Hiccoughs and vomiting since Sunday  History of Present Illness:  [name] Mccoy is an 54 y.o. male with late stage MS (diagnosis 1983) recently begun on shots of peginterferon beta 1a ([place]).  He is bedbound with chronic lower extremity contractures and with a neurogenic bladder requiring indwelling Foley. Since Sunday he is been having hiccups and some distention. He has a caregiver who checks on him was also power of attorney. She states that he had some coffee-ground-looking emesis and brought him to the emergency room. He was evaluated by Dr. Sabra Heck who asked me to see him after CT scan showed air in the wall of the stomach and some of it possibly dissecting up into his mediastinum.  [name] Mccoy has a mildly distended benign tense abdomen. There is no rebound or guarding. He does have hiccups that have been much worse since Sunday according to his caregiver. I discussed an operation versus attempting medical management. He would prefer the latter. There is also a DO NOT RESUSCITATE noted in his diagnosis list.  Past Medical History  Diagnosis Date  . Multiple sclerosis ([place])   . Pressure ulcer, buttock(707.05)   . Pain in joint, pelvic region and thigh   . Spasm of muscle   . Unspecified vitamin D deficiency   . Disturbance of skin sensation   . Right ureteral stone 01/03/13  . Renal stones   . Neurogenic bladder     01/13/13 FOLEY CATH  . Urinary retention with incomplete bladder emptying 01/13/13    FOLEY CATHETER  . Neuromuscular disorder ([place])     multiple schlorosis since 1983  . Hyponatremia 01/27/2013  . DNR (do not resuscitate)     yellow copy    Past Surgical History  Procedure Laterality Date  . Wound debridement Right 10/15/2012    Procedure: DEBRIDMENT OF RIGHT HIP DECUBITUS AND SACRAL DECUBITUS;  Surgeon: Earnstine Regal, MD;  Location: [facility];  Service: General;  Laterality: Right;  left lateral position, right hip  . Nephrolithotomy Right 01/25/2013     Procedure: RIGHT PERCUTANEOUS NEPHROLITHOTOMY ;  Surgeon: Malka So, MD;  Location: [facility];  Service: Urology;  Laterality: Right;  . Kidney stone surgery      x 2    Current Facility-Administered Medications  Medication Dose Route Frequency Provider Last Rate Last Dose  . magnesium sulfate IVPB 2 g 50 mL  2 g Intravenous STAT Noemi Chapel, MD 50 mL/hr at 04/17/15 2147 2 g at 04/17/15 2147  . potassium chloride 10 mEq in 100 mL IVPB  10 mEq Intravenous Once Noemi Chapel, MD 100 mL/hr at 04/17/15 2146 10 mEq at 04/17/15 2146  . vancomycin (VANCOCIN) IVPB 1000 mg/200 mL premix  1,000 mg Intravenous Once Noemi Chapel, MD 200 mL/hr at 04/17/15 2156 1,000 mg at 04/17/15 2156   Current Outpatient Prescriptions  Medication Sig Dispense Refill  . amantadine (SYMMETREL) 100 MG capsule Take 100 mg by mouth 2 (two) times daily.     . cetirizine (ZYRTEC) 10 MG tablet   0  . dantrolene (DANTRIUM) 25 MG capsule Take 25 mg by mouth 3 (three) times daily.    . feeding supplement (ENSURE COMPLETE) LIQD Take 237 mLs by mouth 3 (three) times daily between meals.    . gabapentin (NEURONTIN) 300 MG capsule TAKE 2 CAPSULES 4 TIMES DAILY. 240 capsule 12  . gluconic acid-citric acid (RENACIDIN) irrigation Irrigate with 30 mLs as directed as directed. Every 1-3 days for foley.  1  .  hydrOXYzine (ATARAX/VISTARIL) 25 MG tablet Take 25 mg by mouth every 4 (four) hours as needed for anxiety or itching.   0  . metoprolol succinate (TOPROL-XL) 25 MG 24 hr tablet Take 25 mg by mouth daily.    . montelukast (SINGULAIR) 10 MG tablet Take 10 mg by mouth every morning.   0  . Multiple Vitamin (MULTIVITAMIN WITH MINERALS) TABS Take 1 tablet by mouth daily.    Marland Kitchen nystatin (MYCOSTATIN) 100000 UNIT/ML suspension SWISH AND SWALLOW 5 MILLILITERS FOUR TIMES A DAY FOR 5 DAYS  0  . ondansetron (ZOFRAN) 4 MG tablet take 1 tablet by mouth three times a day AS NEEDED FOR NAUSEA.  0  . oxybutynin (DITROPAN) 5 MG tablet Take 5 mg by  mouth 3 (three) times daily.    Marland Kitchen oxyCODONE-acetaminophen (PERCOCET) 10-325 MG per tablet Take 1 tablet by mouth every 6 (six) hours as needed for pain.   0  . Peginterferon Beta-1a (PLEGRIDY) 125 MCG/0.5ML SOPN Inject 125 mcg into the skin every 14 (fourteen) days.    . simvastatin (ZOCOR) 20 MG tablet Take 20 mg by mouth at bedtime.     Marland Kitchen tiZANidine (ZANAFLEX) 4 MG tablet Take 4 mg by mouth 4 (four) times daily.     Marland Kitchen triamcinolone (KENALOG) 0.025 % cream   0   Review of patient's allergies indicates no known allergies. Family History  Problem Relation Age of Onset  . Hypertension Mother   . Dementia Mother   . Hypertension Father   . Heart disease Father    Social History:   reports that he has never smoked. He has never used smokeless tobacco. He reports that he does not drink alcohol or use illicit drugs.   REVIEW OF SYSTEMS : Negative except for see problem lest  Physical Exam:   Blood pressure 128/84, pulse 137, temperature 101.4 F (38.6 C), temperature source Rectal, resp. rate 15, weight 93.441 kg (206 lb), SpO2 95 %. Body mass index is 27.93 kg/(m^2).  Gen:  Bedbound male with hiccoughs but not experiencing apparent pain Neurological:  Answers appropriately;  Aware of situation -No abdominal pain Heart sinus tach Abdomen mildly distended without rebound or guarding.    LABORATORY RESULTS: Results for orders placed or performed during the hospital encounter of 04/17/15 (from the past 48 hour(s))  Urinalysis, Routine w reflex microscopic (not at [facility])     Status: Abnormal   Collection Time: 04/17/15  5:34 PM  Result Value Ref Range   Color, Urine YELLOW YELLOW   APPearance TURBID (A) CLEAR   Specific Gravity, Urine 1.024 1.005 - 1.030   pH 8.0 5.0 - 8.0   Glucose, UA NEGATIVE NEGATIVE mg/dL   Hgb urine dipstick LARGE (A) NEGATIVE   Bilirubin Urine SMALL (A) NEGATIVE   Ketones, ur NEGATIVE NEGATIVE mg/dL   Protein, ur 100 (A) NEGATIVE mg/dL   Nitrite NEGATIVE  NEGATIVE   Leukocytes, UA LARGE (A) NEGATIVE  Urine microscopic-add on     Status: Abnormal   Collection Time: 04/17/15  5:34 PM  Result Value Ref Range   Squamous Epithelial / LPF 0-5 (A) NONE SEEN   WBC, UA TOO NUMEROUS TO COUNT 0 - 5 WBC/hpf   RBC / HPF TOO NUMEROUS TO COUNT 0 - 5 RBC/hpf   Bacteria, UA MANY (A) NONE SEEN   Crystals TRIPLE PHOSPHATE CRYSTALS (A) NEGATIVE    Comment: BILIRUBIN CRYSTALS  Comprehensive metabolic panel     Status: Abnormal   Collection Time: 04/17/15  5:48  PM  Result Value Ref Range   Sodium 145 135 - 145 mmol/L   Potassium 2.7 (LL) 3.5 - 5.1 mmol/L    Comment: CRITICAL RESULT CALLED TO, READ BACK BY AND VERIFIED WITH: WEST S RN AT 1855 ON 11.29.16 BY MENDOZA B    Chloride 116 (H) 101 - 111 mmol/L   CO2 19 (L) 22 - 32 mmol/L   Glucose, Bld 119 (H) 65 - 99 mg/dL   BUN 33 (H) 6 - 20 mg/dL   Creatinine, Ser 0.75 0.61 - 1.24 mg/dL   Calcium 6.3 (LL) 8.9 - 10.3 mg/dL    Comment: CRITICAL RESULT CALLED TO, READ BACK BY AND VERIFIED WITH: WEST S RN AT 1855 ON 11.29.16 BY MENDOZA B    Total Protein 5.8 (L) 6.5 - 8.1 g/dL   Albumin 2.6 (L) 3.5 - 5.0 g/dL   AST 42 (H) 15 - 41 U/L   ALT 42 17 - 63 U/L   Alkaline Phosphatase 60 38 - 126 U/L   Total Bilirubin 0.9 0.3 - 1.2 mg/dL   GFR calc non Af Amer >60 >60 mL/min   GFR calc Af Amer >60 >60 mL/min    Comment: (NOTE) The eGFR has been calculated using the CKD EPI equation. This calculation has not been validated in all clinical situations. eGFR's persistently <60 mL/min signify possible Chronic Kidney Disease.    Anion gap 10 5 - 15  CBC WITH DIFFERENTIAL     Status: Abnormal   Collection Time: 04/17/15  5:48 PM  Result Value Ref Range   WBC 13.4 (H) 4.0 - 10.5 K/uL   RBC 4.92 4.22 - 5.81 MIL/uL   Hemoglobin 12.4 (L) 13.0 - 17.0 g/dL   HCT 38.8 (L) 39.0 - 52.0 %   MCV 78.9 78.0 - 100.0 fL   MCH 25.2 (L) 26.0 - 34.0 pg   MCHC 32.0 30.0 - 36.0 g/dL   RDW 15.7 (H) 11.5 - 15.5 %   Platelets 234  150 - 400 K/uL   Neutrophils Relative % 81 %   Neutro Abs 10.9 (H) 1.7 - 7.7 K/uL   Lymphocytes Relative 10 %   Lymphs Abs 1.4 0.7 - 4.0 K/uL   Monocytes Relative 9 %   Monocytes Absolute 1.2 (H) 0.1 - 1.0 K/uL   Eosinophils Relative 0 %   Eosinophils Absolute 0.0 0.0 - 0.7 K/uL   Basophils Relative 0 %   Basophils Absolute 0.0 0.0 - 0.1 K/uL  Lipase, blood     Status: None   Collection Time: 04/17/15  5:48 PM  Result Value Ref Range   Lipase 18 11 - 51 U/L  CK     Status: Abnormal   Collection Time: 04/17/15  5:48 PM  Result Value Ref Range   Total CK 24 (L) 49 - 397 U/L  I-Stat CG4 Lactic Acid, ED  (not at  [facility])     Status: Abnormal   Collection Time: 04/17/15  5:56 PM  Result Value Ref Range   Lactic Acid, Venous 3.05 (HH) 0.5 - 2.0 mmol/L   Comment NOTIFIED PHYSICIAN   POC occult blood, ED RN will collect     Status: Abnormal   Collection Time: 04/17/15  5:56 PM  Result Value Ref Range   Fecal Occult Bld POSITIVE (A) NEGATIVE  Type and screen     Status: None   Collection Time: 04/17/15  6:25 PM  Result Value Ref Range   ABO/RH(D) A POS    Antibody Screen NEG  Sample Expiration 04/20/2015   I-Stat CG4 Lactic Acid, ED     Status: Abnormal   Collection Time: 04/17/15  8:13 PM  Result Value Ref Range   Lactic Acid, Venous 4.86 (HH) 0.5 - 2.0 mmol/L     RADIOLOGY RESULTS: Ct Abdomen Pelvis W Contrast  04/17/2015  CLINICAL DATA:  Vomiting blood, tachycardia, altered mental status, possible viscus perforation EXAM: CT ABDOMEN AND PELVIS WITH CONTRAST TECHNIQUE: Multidetector CT imaging of the abdomen and pelvis was performed using the standard protocol following bolus administration of intravenous contrast. CONTRAST:  159mL OMNIPAQUE IOHEXOL 300 MG/ML  SOLN COMPARISON:  01/26/2013 FINDINGS: Lower chest: Interstitial change at both lung bases possibly representing pulmonary edema. There is motion artifact. Pneumonia/pneumonitis not excluded. Hepatobiliary: There is some  gallbladder wall calcification as well as sludge similar to prior study Pancreas: Normal Spleen: Normal Adrenals/Urinary Tract: Adrenal glands are normal. A few small left renal cysts are present. There is a Foley catheter decompressing the bladder. Stomach/Bowel: Stomach is distended with air-fluid level. There is air in the wall of the stomach and there is pneumoperitoneum around the stomach. Fluid refluxes into the distended distal esophagus. No significant abnormalities of small or large bowel, which are relatively decompressed when compared to the stomach. Vascular/Lymphatic: No acute findings Reproductive: No acute abnormalities Other: Air extends through the esophageal hiatus into the lower mediastinum, although a greater volume of free air is seen below the diaphragm. Musculoskeletal: Limited evaluation due to suboptimal positioning. Evidence of prior deformed left femur fracture. 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 Evidence of perforated viscus with pneumoperitoneum left upper quadrant.Pneumatosis can be seen as evidence of wall necrosis related to ischemia or emphysematous gastritis; however, the gastric wall does not appear thickened currently and the possibility that pneumatotics may be of mechanical origin is considered. Bilateral lower lobe infiltrates suggesting edema or possibly pneumonia/pneumonitis Critical Value/emergent results were called by telephone at the time of interpretation on 04/17/2015 at 9:41 pm to Dr. Noemi Chapel , who verbally acknowledged these results. Electronically Signed   By: Skipper Cliche M.D.   On: 04/17/2015 21:41   Dg Chest Port 1 View  04/17/2015  CLINICAL DATA:  Upper GI bleed, hematemesis EXAM: PORTABLE CHEST 1 VIEW COMPARISON:  03/18/2015 FINDINGS: Limited inspiratory effect. Uncoiling of the aorta. Normal heart size. Moderate diffuse interstitial change stable from prior study. There is gas in the left upper quadrant which  is only partially visualized, which may be entirely within the stomach. Possibility of left pneumoperitoneum not excluded. IMPRESSION: 1. Chronic interstitial lung disease 2. CT of the abdomen and pelvis recommended in light of clinical information. Pneumoperitoneum not excluded in the left upper abdomen. Electronically Signed   By: Skipper Cliche M.D.   On: 04/17/2015 18:15    Problem List: Patient Active Problem List   Diagnosis Date Noted  . UTI (lower urinary tract infection) 07/14/2013  . Pyelonephritis 07/14/2013  . Recurrent UTI 04/17/2013  . Protein-calorie malnutrition, severe ([place]) 04/16/2013  . NSVT (nonsustained ventricular tachycardia) ([place]) 01/28/2013  . Hyponatremia 01/27/2013  . Microcytic anemia 01/27/2013  . Weakness generalized 01/21/2013  . Palliative care encounter 01/21/2013  . Adult failure to thrive 01/21/2013  . Altered mental status 01/20/2013  . Septic shock ([place]) 01/03/2013  . Renal calculus, right 01/03/2013  . Right ureteral stone 01/03/2013  . Renal stones   . Neurogenic bladder   . Urinary retention with incomplete bladder emptying   . Multiple sclerosis exacerbation ([place]) 11/13/2012  .  Fever, unspecified 11/13/2012  . Leukocytosis, unspecified 10/13/2012  . Pressure ulcer, buttock(707.05)   . Pain in joint, pelvic region and thigh   . Spasm of muscle   . Unspecified vitamin D deficiency   . Sepsis ([place]) 09/17/2012  . Hypotension 09/16/2012  . Tachycardia 09/16/2012  . SIRS (systemic inflammatory response syndrome) ([place]) 09/16/2012  . UTI (urinary tract infection) 09/16/2012  . Hypokalemia 09/16/2012  . Anemia 09/16/2012  . Thrombocytosis ([place]) 08/02/2011  . Fecal impaction ([place]) 07/30/2011  . Fall 07/30/2011  . Wounds, multiple 07/30/2011  . Rhabdomyolysis 07/26/2011  . Multiple sclerosis ([place]) 07/26/2011  . Dehydration 07/26/2011    Assessment & Plan:  I have reviewed his CT and performed a PE.  I have discussed management strategies  with him and his caregiver.  I think that he may have gastric outlet obstruction and with distention has developed gastric ischemia.  I don't think that this is a Boerhoave's syndrome from vomiting.  I would recommend NG insertion and decompression with resuscitation with IV fluids.  He has autonomic nervous system involvement and this may effect his vital signs.  His absence of peritoneal signs leads me away from recommending laparotomy but would opt for medical management in the ICU.  If his stomach can be decompressed then workup could reveal the source of his gastric outlet obstruction.  CCS will follow.     Matt B. Hassell Done, MD, [facility] [phone] beeper [phone]  04/17/2015 10:28 PM

## 2015-04-17 NOTE — ED Notes (Signed)
MD Sabra Heck is aware of pts elevated lactic acid result.

## 2015-04-17 NOTE — ED Notes (Signed)
MD made aware of HR in the 140-150s as well as the pt's complaint of abdominal pain

## 2015-04-17 NOTE — ED Notes (Signed)
MD/RN notified of abnormal labs

## 2015-04-17 NOTE — ED Notes (Signed)
Dr. Sabra Heck requests STAT CT for Pt.  RN contacted CT to request Pt move to next in line.

## 2015-04-17 NOTE — Telephone Encounter (Signed)
LMVM for [name] Mccoy that was returning her call.

## 2015-04-17 NOTE — ED Notes (Signed)
Bed: [id number] Expected date:  Expected time:  Means of arrival:  Comments: EMS- 54yo M, AMS/septic?

## 2015-04-17 NOTE — ED Notes (Signed)
Notified MD regarding antibiotic orders.  Pt difficult stick. Only one blood culture obtained

## 2015-04-17 NOTE — ED Notes (Signed)
Small amount of bleeding from left nare prior to insertion, right nare used

## 2015-04-18 ENCOUNTER — Inpatient Hospital Stay (HOSPITAL_COMMUNITY): Payer: Medicaid Other

## 2015-04-18 ENCOUNTER — Encounter (HOSPITAL_COMMUNITY): Payer: Self-pay | Admitting: Anesthesiology

## 2015-04-18 ENCOUNTER — Encounter (HOSPITAL_COMMUNITY)
Admission: EM | Disposition: A | Discharge status: Home Health | Payer: Self-pay | Source: Home / Self Care | Attending: Internal Medicine

## 2015-04-18 DIAGNOSIS — L89893 Pressure ulcer of other site, stage 3: Secondary | ICD-10-CM | POA: Diagnosis present

## 2015-04-18 DIAGNOSIS — E872 Acidosis, unspecified: Secondary | ICD-10-CM | POA: Diagnosis present

## 2015-04-18 DIAGNOSIS — D638 Anemia in other chronic diseases classified elsewhere: Secondary | ICD-10-CM | POA: Diagnosis present

## 2015-04-18 DIAGNOSIS — E162 Hypoglycemia, unspecified: Secondary | ICD-10-CM | POA: Diagnosis present

## 2015-04-18 DIAGNOSIS — Z79899 Other long term (current) drug therapy: Secondary | ICD-10-CM | POA: Diagnosis not present

## 2015-04-18 DIAGNOSIS — M87851 Other osteonecrosis, right femur: Secondary | ICD-10-CM | POA: Diagnosis present

## 2015-04-18 DIAGNOSIS — R69 Illness, unspecified: Secondary | ICD-10-CM | POA: Diagnosis not present

## 2015-04-18 DIAGNOSIS — G35 Multiple sclerosis: Secondary | ICD-10-CM | POA: Diagnosis present

## 2015-04-18 DIAGNOSIS — M24551 Contracture, right hip: Secondary | ICD-10-CM | POA: Diagnosis present

## 2015-04-18 DIAGNOSIS — K668 Other specified disorders of peritoneum: Secondary | ICD-10-CM | POA: Diagnosis present

## 2015-04-18 DIAGNOSIS — Z8249 Family history of ischemic heart disease and other diseases of the circulatory system: Secondary | ICD-10-CM | POA: Diagnosis not present

## 2015-04-18 DIAGNOSIS — R198 Other specified symptoms and signs involving the digestive system and abdomen: Secondary | ICD-10-CM | POA: Insufficient documentation

## 2015-04-18 DIAGNOSIS — R1110 Vomiting, unspecified: Secondary | ICD-10-CM | POA: Diagnosis not present

## 2015-04-18 DIAGNOSIS — Z7401 Bed confinement status: Secondary | ICD-10-CM | POA: Diagnosis not present

## 2015-04-18 DIAGNOSIS — E876 Hypokalemia: Secondary | ICD-10-CM | POA: Diagnosis not present

## 2015-04-18 DIAGNOSIS — Z6826 Body mass index (BMI) 26.0-26.9, adult: Secondary | ICD-10-CM | POA: Diagnosis not present

## 2015-04-18 DIAGNOSIS — E43 Unspecified severe protein-calorie malnutrition: Secondary | ICD-10-CM | POA: Diagnosis not present

## 2015-04-18 DIAGNOSIS — R6520 Severe sepsis without septic shock: Secondary | ICD-10-CM | POA: Diagnosis present

## 2015-04-18 DIAGNOSIS — M62838 Other muscle spasm: Secondary | ICD-10-CM | POA: Diagnosis present

## 2015-04-18 DIAGNOSIS — G8220 Paraplegia, unspecified: Secondary | ICD-10-CM | POA: Diagnosis present

## 2015-04-18 DIAGNOSIS — K2970 Gastritis, unspecified, without bleeding: Secondary | ICD-10-CM | POA: Diagnosis present

## 2015-04-18 DIAGNOSIS — N319 Neuromuscular dysfunction of bladder, unspecified: Secondary | ICD-10-CM | POA: Diagnosis present

## 2015-04-18 DIAGNOSIS — L89304 Pressure ulcer of unspecified buttock, stage 4: Secondary | ICD-10-CM | POA: Diagnosis not present

## 2015-04-18 DIAGNOSIS — N390 Urinary tract infection, site not specified: Secondary | ICD-10-CM | POA: Diagnosis present

## 2015-04-18 DIAGNOSIS — L89153 Pressure ulcer of sacral region, stage 3: Secondary | ICD-10-CM | POA: Diagnosis not present

## 2015-04-18 DIAGNOSIS — A419 Sepsis, unspecified organism: Secondary | ICD-10-CM | POA: Diagnosis not present

## 2015-04-18 DIAGNOSIS — R112 Nausea with vomiting, unspecified: Secondary | ICD-10-CM | POA: Diagnosis present

## 2015-04-18 DIAGNOSIS — L89323 Pressure ulcer of left buttock, stage 3: Secondary | ICD-10-CM | POA: Diagnosis present

## 2015-04-18 DIAGNOSIS — Z66 Do not resuscitate: Secondary | ICD-10-CM | POA: Diagnosis present

## 2015-04-18 DIAGNOSIS — M24552 Contracture, left hip: Secondary | ICD-10-CM | POA: Diagnosis present

## 2015-04-18 LAB — COMPREHENSIVE METABOLIC PANEL
ALBUMIN: 2.9 g/dL — AB (ref 3.5–5.0)
ALT: 59 U/L (ref 17–63)
AST: 53 U/L — AB (ref 15–41)
Alkaline Phosphatase: 67 U/L (ref 38–126)
Anion gap: 7 (ref 5–15)
BILIRUBIN TOTAL: 1.6 mg/dL — AB (ref 0.3–1.2)
BUN: 26 mg/dL — AB (ref 6–20)
CHLORIDE: 114 mmol/L — AB (ref 101–111)
CO2: 21 mmol/L — ABNORMAL LOW (ref 22–32)
CREATININE: 0.92 mg/dL (ref 0.61–1.24)
Calcium: 7.1 mg/dL — ABNORMAL LOW (ref 8.9–10.3)
GFR calc Af Amer: >60 mL/min (ref >60)
GLUCOSE: 149 mg/dL — AB (ref 65–99)
Potassium: 4 mmol/L (ref 3.5–5.1)
Sodium: 142 mmol/L (ref 135–145)
Total Protein: 6.4 g/dL — ABNORMAL LOW (ref 6.5–8.1)

## 2015-04-18 LAB — PROTIME-INR
INR: 1.22 (ref 0.00–1.49)
Prothrombin Time: 15.6 seconds — ABNORMAL HIGH (ref 11.6–15.2)

## 2015-04-18 LAB — CBC
HCT: 36.4 % — ABNORMAL LOW (ref 39.0–52.0)
Hemoglobin: 11.5 g/dL — ABNORMAL LOW (ref 13.0–17.0)
MCH: 24.7 pg — AB (ref 26.0–34.0)
MCHC: 31.6 g/dL (ref 30.0–36.0)
MCV: 78.3 fL (ref 78.0–100.0)
PLATELETS: 229 10*3/uL (ref 150–400)
RBC: 4.65 MIL/uL (ref 4.22–5.81)
RDW: 15.6 % — AB (ref 11.5–15.5)
WBC: 14.9 10*3/uL — AB (ref 4.0–10.5)

## 2015-04-18 LAB — APTT: aPTT: 26 seconds (ref 24–37)

## 2015-04-18 LAB — URINE CULTURE

## 2015-04-18 LAB — MAGNESIUM: Magnesium: 2.4 mg/dL (ref 1.7–2.4)

## 2015-04-18 LAB — LACTIC ACID, PLASMA: Lactic Acid, Venous: 1.6 mmol/L (ref 0.5–2.0)

## 2015-04-18 LAB — PHOSPHORUS: PHOSPHORUS: 1.9 mg/dL — AB (ref 2.5–4.6)

## 2015-04-18 LAB — GLUCOSE, CAPILLARY: Glucose-Capillary: 140 mg/dL — ABNORMAL HIGH (ref 65–99)

## 2015-04-18 LAB — MRSA PCR SCREENING: MRSA by PCR: POSITIVE — AB

## 2015-04-18 LAB — PROCALCITONIN: PROCALCITONIN: 0.44 ng/mL

## 2015-04-18 SURGERY — EGD (ESOPHAGOGASTRODUODENOSCOPY)
Anesthesia: Monitor Anesthesia Care

## 2015-04-18 MED ORDER — PEGINTERFERON BETA-1A 125 MCG/0.5ML ~~LOC~~ SOPN: 125.0000 ug | PEN_INJECTOR | SUBCUTANEOUS | Status: DC

## 2015-04-18 MED ORDER — POTASSIUM CHLORIDE 10 MEQ/50ML IV SOLN
10.0000 meq | INTRAVENOUS | Status: DC
Start: 2015-04-18 — End: 2015-04-18
  Filled 2015-04-18 (×4): qty 50

## 2015-04-18 MED ORDER — MORPHINE SULFATE (PF) 2 MG/ML IV SOLN: 2.0000 mg | INTRAVENOUS | Status: DC | PRN

## 2015-04-18 MED ORDER — SODIUM CHLORIDE 0.9 % IV SOLN
INTRAVENOUS | Status: DC
  Administered 2015-04-18 (×2): via INTRAVENOUS

## 2015-04-18 MED ORDER — SODIUM CHLORIDE 0.9 % IJ SOLN
3.0000 mL | Freq: Two times a day (BID) | INTRAMUSCULAR | Status: DC
  Administered 2015-04-18 – 2015-04-27 (×11): 3 mL via INTRAVENOUS

## 2015-04-18 MED ORDER — HYDROXYZINE HCL 25 MG PO TABS
25.0000 mg | ORAL_TABLET | ORAL | Status: DC | PRN
  Administered 2015-04-20: 25 mg via ORAL
  Filled 2015-04-18: qty 1

## 2015-04-18 MED ORDER — PANTOPRAZOLE SODIUM 40 MG IV SOLR
40.0000 mg | INTRAVENOUS | Status: DC
Start: 2015-04-18 — End: 2015-04-24
  Administered 2015-04-18 – 2015-04-24 (×7): 40 mg via INTRAVENOUS
  Filled 2015-04-18 (×8): qty 40

## 2015-04-18 MED ORDER — TRIAMCINOLONE ACETONIDE 0.025 % EX CREA
TOPICAL_CREAM | Freq: Two times a day (BID) | CUTANEOUS | Status: DC
  Administered 2015-04-18 – 2015-04-20 (×6): via TOPICAL
  Administered 2015-04-20: 1 via TOPICAL
  Administered 2015-04-21 – 2015-04-26 (×7): via TOPICAL
  Filled 2015-04-18: qty 15

## 2015-04-18 MED ORDER — ONDANSETRON HCL 4 MG/2ML IJ SOLN: 4.0000 mg | Freq: Three times a day (TID) | INTRAMUSCULAR | Status: DC | PRN

## 2015-04-18 MED ORDER — MONTELUKAST SODIUM 10 MG PO TABS
10.0000 mg | ORAL_TABLET | Freq: Every morning | ORAL | Status: DC
  Administered 2015-04-18 – 2015-04-26 (×5): 10 mg via ORAL
  Filled 2015-04-18 (×9): qty 1

## 2015-04-18 MED ORDER — PEGINTERFERON BETA-1A 125 MCG/0.5ML ~~LOC~~ SOPN
125.0000 ug | PEN_INJECTOR | SUBCUTANEOUS | Status: DC
  Administered 2015-04-25: 125 ug via SUBCUTANEOUS
  Filled 2015-04-18: qty 1

## 2015-04-18 MED ORDER — POTASSIUM PHOSPHATES 15 MMOLE/5ML IV SOLN
30.0000 mmol | Freq: Once | INTRAVENOUS | Status: AC
  Administered 2015-04-18: 30 mmol via INTRAVENOUS
  Filled 2015-04-18: qty 10

## 2015-04-18 MED ORDER — VANCOMYCIN HCL IN DEXTROSE 1-5 GM/200ML-% IV SOLN
1000.0000 mg | Freq: Three times a day (TID) | INTRAVENOUS | Status: DC
  Administered 2015-04-18 (×3): 1000 mg via INTRAVENOUS
  Filled 2015-04-18 (×5): qty 200

## 2015-04-18 MED ORDER — OXYCODONE-ACETAMINOPHEN 5-325 MG PO TABS: 1.0000 | ORAL_TABLET | Freq: Four times a day (QID) | ORAL | Status: DC | PRN

## 2015-04-18 MED ORDER — AMANTADINE HCL 100 MG PO CAPS
100.0000 mg | ORAL_CAPSULE | Freq: Two times a day (BID) | ORAL | Status: DC
  Administered 2015-04-18 – 2015-04-27 (×18): 100 mg via ORAL
  Filled 2015-04-18 (×23): qty 1

## 2015-04-18 MED ORDER — TIZANIDINE HCL 4 MG PO TABS
4.0000 mg | ORAL_TABLET | Freq: Four times a day (QID) | ORAL | Status: DC
  Administered 2015-04-18 – 2015-04-27 (×32): 4 mg via ORAL
  Filled 2015-04-18 (×43): qty 1

## 2015-04-18 MED ORDER — SODIUM CHLORIDE 0.9 % IV BOLUS (SEPSIS)
1000.0000 mL | Freq: Once | INTRAVENOUS | Status: AC
  Administered 2015-04-18: 1000 mL via INTRAVENOUS

## 2015-04-18 MED ORDER — MUPIROCIN 2 % EX OINT
1.0000 "application " | TOPICAL_OINTMENT | Freq: Two times a day (BID) | CUTANEOUS | Status: AC
  Administered 2015-04-18 – 2015-04-22 (×10): 1 via NASAL
  Filled 2015-04-18 (×2): qty 22

## 2015-04-18 MED ORDER — ADULT MULTIVITAMIN W/MINERALS CH
1.0000 | ORAL_TABLET | Freq: Every day | ORAL | Status: DC
  Administered 2015-04-18 – 2015-04-27 (×9): 1 via ORAL
  Filled 2015-04-18 (×10): qty 1

## 2015-04-18 MED ORDER — SODIUM CHLORIDE 0.9 % IV BOLUS (SEPSIS): 500.0000 mL | Freq: Once | INTRAVENOUS | Status: DC

## 2015-04-18 MED ORDER — CHLORHEXIDINE GLUCONATE CLOTH 2 % EX PADS
6.0000 | MEDICATED_PAD | Freq: Every day | CUTANEOUS | Status: AC
  Administered 2015-04-18 – 2015-04-22 (×4): 6 via TOPICAL

## 2015-04-18 MED ORDER — NYSTATIN 100000 UNIT/ML MT SUSP
5.0000 mL | Freq: Four times a day (QID) | OROMUCOSAL | Status: DC
  Administered 2015-04-18 – 2015-04-22 (×18): 500000 [IU] via ORAL
  Filled 2015-04-18 (×21): qty 5

## 2015-04-18 MED ORDER — DANTROLENE SODIUM 25 MG PO CAPS
25.0000 mg | ORAL_CAPSULE | Freq: Three times a day (TID) | ORAL | Status: DC
  Administered 2015-04-18 – 2015-04-27 (×26): 25 mg via ORAL
  Filled 2015-04-18 (×34): qty 1

## 2015-04-18 MED ORDER — POTASSIUM CHLORIDE 10 MEQ/100ML IV SOLN
10.0000 meq | INTRAVENOUS | Status: AC
  Administered 2015-04-18 (×4): 10 meq via INTRAVENOUS
  Filled 2015-04-18 (×2): qty 100

## 2015-04-18 MED ORDER — PIPERACILLIN-TAZOBACTAM 3.375 G IVPB
3.3750 g | Freq: Three times a day (TID) | INTRAVENOUS | Status: DC
  Administered 2015-04-18 – 2015-04-25 (×23): 3.375 g via INTRAVENOUS
  Filled 2015-04-18 (×24): qty 50

## 2015-04-18 MED ORDER — OXYCODONE HCL 5 MG PO TABS: 5.0000 mg | ORAL_TABLET | Freq: Four times a day (QID) | ORAL | Status: DC | PRN

## 2015-04-18 MED ORDER — LORATADINE 10 MG PO TABS
10.0000 mg | ORAL_TABLET | Freq: Every day | ORAL | Status: DC
  Administered 2015-04-18 – 2015-04-26 (×5): 10 mg via ORAL
  Filled 2015-04-18 (×9): qty 1

## 2015-04-18 MED ORDER — RENACIDIN IR SOLN
30.0000 mL | Status: DC
  Administered 2015-04-18 – 2015-04-24 (×3): 30 mL

## 2015-04-18 MED ORDER — OXYCODONE-ACETAMINOPHEN 10-325 MG PO TABS: 1.0000 | ORAL_TABLET | Freq: Four times a day (QID) | ORAL | Status: DC | PRN

## 2015-04-18 MED ORDER — HEPARIN SODIUM (PORCINE) 5000 UNIT/ML IJ SOLN: 5000.0000 [IU] | Freq: Three times a day (TID) | INTRAMUSCULAR | Status: DC

## 2015-04-18 NOTE — Progress Notes (Addendum)
Patient seen and examined, he denies pain, no n/v observed, does not seem in acute distress, though sinus tachycardia on tele. He is oriented to self, does not know the place and time, does follow command, lung clears, chronic contractures, foley with clear urine. Lactic acid normalized, culture pending, On vanc/zosyn/protonix, npo for EGD this am, [facility] and critical care following.

## 2015-04-18 NOTE — Progress Notes (Signed)
ANTIBIOTIC CONSULT NOTE - INITIAL  Pharmacy Consult for Vancomycin and Zosyn  Indication: rule out pneumonia, UTI, intra-abdominal infection  No Known Allergies  Patient Measurements: Height: 6' 0.05" (183 cm) Weight: 197 lb 1.5 oz (89.4 kg) IBW/kg (Calculated) : 77.71 Adjusted Body Weight:   Vital Signs: Temp: 98.5 F (36.9 C) (11/30 0400) Temp Source: Oral (11/30 0400) BP: 118/74 mmHg (11/30 0500) Pulse Rate: 124 (11/30 0500) Intake/Output from previous day: 11/29 0701 - 11/30 0700 In: 643.3 [I.V.:393.3; IV Piggyback:250] Out: 700 [Emesis/NG output:700] Intake/Output from this shift: Total I/O In: 643.3 [I.V.:393.3; IV Piggyback:250] Out: 700 [Emesis/NG output:700]  Labs:  Recent Labs  04/17/15 1748 04/18/15 0410  WBC 13.4* 14.9*  HGB 12.4* 11.5*  PLT 234 229  CREATININE 0.75 0.92   Estimated Creatinine Clearance: 103.2 mL/min (by C-G formula based on Cr of 0.92). No results for input(s): VANCOTROUGH, VANCOPEAK, VANCORANDOM, GENTTROUGH, GENTPEAK, GENTRANDOM, TOBRATROUGH, TOBRAPEAK, TOBRARND, AMIKACINPEAK, AMIKACINTROU, AMIKACIN in the last 72 hours.   Microbiology: Recent Results (from the past 720 hour(s))  MRSA PCR Screening     Status: Abnormal   Collection Time: 04/18/15  1:48 AM  Result Value Ref Range Status   MRSA by PCR POSITIVE (A) NEGATIVE Final    Comment:        The GeneXpert MRSA Assay (FDA approved for NASAL specimens only), is one component of a comprehensive MRSA colonization surveillance program. It is not intended to diagnose MRSA infection nor to guide or monitor treatment for MRSA infections. RESULT CALLED TO, READ BACK BY AND VERIFIED WITH: Merceda Elks RN AT  [phone] ON 11.30.16 BY SHUEA     Medical History: Past Medical History  Diagnosis Date  . Multiple sclerosis ([place])   . Pressure ulcer, buttock(707.05)   . Pain in joint, pelvic region and thigh   . Spasm of muscle   . Unspecified vitamin D deficiency   . Disturbance of  skin sensation   . Right ureteral stone 01/03/13  . Renal stones   . Neurogenic bladder     01/13/13 FOLEY CATH  . Urinary retention with incomplete bladder emptying 01/13/13    FOLEY CATHETER  . Neuromuscular disorder ([place])     multiple schlorosis since 1983  . Hyponatremia 01/27/2013  . DNR (do not resuscitate)     yellow copy    Medications:  Anti-infectives    Start     Dose/Rate Route Frequency Ordered Stop   04/18/15 0600  vancomycin (VANCOCIN) IVPB 1000 mg/200 mL premix     1,000 mg 200 mL/hr over 60 Minutes Intravenous Every 8 hours 04/18/15 0102     04/18/15 0030  piperacillin-tazobactam (ZOSYN) IVPB 3.375 g     3.375 g 12.5 mL/hr over 240 Minutes Intravenous 3 times per day 04/18/15 0022     04/17/15 2145  vancomycin (VANCOCIN) IVPB 1000 mg/200 mL premix     1,000 mg 200 mL/hr over 60 Minutes Intravenous  Once 04/17/15 2138 04/17/15 2256   04/17/15 1845  cefTRIAXone (ROCEPHIN) 1 g in dextrose 5 % 50 mL IVPB     1 g 100 mL/hr over 30 Minutes Intravenous  Once 04/17/15 1834 04/17/15 2053     Assessment: Patient in ED with N/V.  Patient with (+) UTI.  CT noted perforated viscus, pneumatosis.  Bilateral lower lobe infiltrates suggesting edema or possibly pneumonia/pneumonitis.  Goal of Therapy:  Vancomycin trough level 15-20 mcg/ml  Zosyn based on renal function Appropriate antibiotic dosing for renal function; eradication of infection   Plan:  Measure antibiotic drug levels at steady state Follow up culture results Vancomycin 1gm iv q8hr  Zosyn 3.375g IV Q8H infused over 4hrs.   Tyler Deis, Shea Stakes [place] 04/18/2015,5:51 AM

## 2015-04-18 NOTE — Consult Note (Signed)
Urology Consult  Referring physician: Dillard Cannon Reason for referral: Unable to insert catheer  Chief Complaint: Unable to insert catheter  History of Present Illness: Patient well known to Dr Jeffie Pollock who has monthly catheter changes with 18 Fr catheter; admitted with medical issues; catheter was removed but unable to reinsert; MS and NGB; ? Gastric perforaton Patient noted leaking around catheter Modifying factors: There are no other modifying factors  Associated signs and symptoms: There are no other associated signs and symptoms Aggravating and relieving factors: There are no other aggravating or relieving factors Severity: Moderate Duration: Persistent   Past Medical History  Diagnosis Date  . Multiple sclerosis ([place])   . Pressure ulcer, buttock(707.05)   . Pain in joint, pelvic region and thigh   . Spasm of muscle   . Unspecified vitamin D deficiency   . Disturbance of skin sensation   . Right ureteral stone 01/03/13  . Renal stones   . Neurogenic bladder     01/13/13 FO[facility] CATH  . Urinary retention with incomplete bladder emptying 01/13/13    [facility] CATHETER  . Neuromuscular disorder ([place])     multiple schlorosis since 1983  . Hyponatremia 01/27/2013  . DNR (do not resuscitate)     yellow copy   Past Surgical History  Procedure Laterality Date  . Wound debridement Right 10/15/2012    Procedure: DEBRIDMENT OF RIGHT HIP DECUBITUS AND SACRAL DECUBITUS;  Surgeon: Earnstine Regal, MD;  Location: [facility];  Service: General;  Laterality: Right;  left lateral position, right hip  . Nephrolithotomy Right 01/25/2013    Procedure: RIGHT PERCUTANEOUS NEPHROLITHOTOMY ;  Surgeon: Malka So, MD;  Location: [facility];  Service: Urology;  Laterality: Right;  . Kidney stone surgery      x 2    Medications: I have reviewed the patient's current medications. Allergies: No Known Allergies  Family History  Problem Relation Age of Onset  . Hypertension Mother   . Dementia Mother   . Hypertension  Father   . Heart disease Father    Social History:  reports that he has never smoked. He has never used smokeless tobacco. He reports that he does not drink alcohol or use illicit drugs.  ROS: All systems are reviewed and negative except as noted. Rest negative  Physical Exam:  Vital signs in last 24 hours: Temp:  [97.7 F (36.5 C)-100 F (37.8 C)] 97.7 F (36.5 C) (11/30 1734) Pulse Rate:  [81-145] 81 (11/30 1734) Resp:  [10-28] 20 (11/30 1734) BP: (88-128)/(52-98) 88/58 mmHg (11/30 1734) SpO2:  [88 %-99 %] 99 % (11/30 1734) Weight:  [89.4 kg (197 lb 1.5 oz)] 89.4 kg (197 lb 1.5 oz) (11/30 0400)  Cardiovascular: Skin warm; not flushed Respiratory: Breaths quiet; no shortness of breath Abdomen: No masses Neurological: Normal sensation to touch Musculoskeletal: Normal motor function arms and legs Lymphatics: No inguinal adenopathy Skin: No rashes Genitourinary:penoscrotal hypspadia with severe leg spasticity  Laboratory Data:  Results for orders placed or performed during the hospital encounter of 04/17/15 (from the past 72 hour(s))  Urinalysis, Routine w reflex microscopic (not at [facility])     Status: Abnormal   Collection Time: 04/17/15  5:34 PM  Result Value Ref Range   Color, Urine YELLOW YELLOW   APPearance TURBID (A) CLEAR   Specific Gravity, Urine 1.024 1.005 - 1.030   pH 8.0 5.0 - 8.0   Glucose, UA NEGATIVE NEGATIVE mg/dL   Hgb urine dipstick LARGE (A) NEGATIVE   Bilirubin Urine SMALL (  A) NEGATIVE   Ketones, ur NEGATIVE NEGATIVE mg/dL   Protein, ur 100 (A) NEGATIVE mg/dL   Nitrite NEGATIVE NEGATIVE   Leukocytes, UA LARGE (A) NEGATIVE  Urine culture     Status: None   Collection Time: 04/17/15  5:34 PM  Result Value Ref Range   Specimen Description URINE, CATHETERIZED    Special Requests NONE    Culture      MULTIPLE SPECIES PRESENT, SUGGEST RECOLLECTION Performed at [facility]    Report Status 04/18/2015 FINAL   Urine microscopic-add on     Status:  Abnormal   Collection Time: 04/17/15  5:34 PM  Result Value Ref Range   Squamous Epithelial / LPF 0-5 (A) NONE SEEN   WBC, UA TOO NUMEROUS TO COUNT 0 - 5 WBC/hpf   RBC / HPF TOO NUMEROUS TO COUNT 0 - 5 RBC/hpf   Bacteria, UA MANY (A) NONE SEEN   Crystals TRIPLE PHOSPHATE CRYSTALS (A) NEGATIVE    Comment: BILIRUBIN CRYSTALS  Comprehensive metabolic panel     Status: Abnormal   Collection Time: 04/17/15  5:48 PM  Result Value Ref Range   Sodium 145 135 - 145 mmol/L   Potassium 2.7 (LL) 3.5 - 5.1 mmol/L    Comment: CRITICAL RESULT CALLED TO, READ BACK BY AND VERIFIED WITH: WEST S RN AT 1855 ON 11.29.16 BY MENDOZA B    Chloride 116 (H) 101 - 111 mmol/L   CO2 19 (L) 22 - 32 mmol/L   Glucose, Bld 119 (H) 65 - 99 mg/dL   BUN 33 (H) 6 - 20 mg/dL   Creatinine, Ser 0.75 0.61 - 1.24 mg/dL   Calcium 6.3 (LL) 8.9 - 10.3 mg/dL    Comment: CRITICAL RESULT CALLED TO, READ BACK BY AND VERIFIED WITH: WEST S RN AT 1855 ON 11.29.16 BY MENDOZA B    Total Protein 5.8 (L) 6.5 - 8.1 g/dL   Albumin 2.6 (L) 3.5 - 5.0 g/dL   AST 42 (H) 15 - 41 U/L   ALT 42 17 - 63 U/L   Alkaline Phosphatase 60 38 - 126 U/L   Total Bilirubin 0.9 0.3 - 1.2 mg/dL   GFR calc non Af Amer >60 >60 mL/min   GFR calc Af Amer >60 >60 mL/min    Comment: (NOTE) The eGFR has been calculated using the CKD EPI equation. This calculation has not been validated in all clinical situations. eGFR's persistently <60 mL/min signify possible Chronic Kidney Disease.    Anion gap 10 5 - 15  CBC WITH DIFFERENTIAL     Status: Abnormal   Collection Time: 04/17/15  5:48 PM  Result Value Ref Range   WBC 13.4 (H) 4.0 - 10.5 K/uL   RBC 4.92 4.22 - 5.81 MIL/uL   Hemoglobin 12.4 (L) 13.0 - 17.0 g/dL   HCT 38.8 (L) 39.0 - 52.0 %   MCV 78.9 78.0 - 100.0 fL   MCH 25.2 (L) 26.0 - 34.0 pg   MCHC 32.0 30.0 - 36.0 g/dL   RDW 15.7 (H) 11.5 - 15.5 %   Platelets 234 150 - 400 K/uL   Neutrophils Relative % 81 %   Neutro Abs 10.9 (H) 1.7 - 7.7 K/uL    Lymphocytes Relative 10 %   Lymphs Abs 1.4 0.7 - 4.0 K/uL   Monocytes Relative 9 %   Monocytes Absolute 1.2 (H) 0.1 - 1.0 K/uL   Eosinophils Relative 0 %   Eosinophils Absolute 0.0 0.0 - 0.7 K/uL   Basophils Relative  0 %   Basophils Absolute 0.0 0.0 - 0.1 K/uL  Lipase, blood     Status: None   Collection Time: 04/17/15  5:48 PM  Result Value Ref Range   Lipase 18 11 - 51 U/L  CK     Status: Abnormal   Collection Time: 04/17/15  5:48 PM  Result Value Ref Range   Total CK 24 (L) 49 - 397 U/L  I-Stat CG4 Lactic Acid, ED  (not at  [facility])     Status: Abnormal   Collection Time: 04/17/15  5:56 PM  Result Value Ref Range   Lactic Acid, Venous 3.05 (HH) 0.5 - 2.0 mmol/L   Comment NOTIFIED PHYSICIAN   POC occult blood, ED RN will collect     Status: Abnormal   Collection Time: 04/17/15  5:56 PM  Result Value Ref Range   Fecal Occult Bld POSITIVE (A) NEGATIVE  Type and screen     Status: None   Collection Time: 04/17/15  6:25 PM  Result Value Ref Range   ABO/RH(D) A POS    Antibody Screen NEG    Sample Expiration 04/20/2015   Blood Culture (routine x 2)     Status: None (Preliminary result)   Collection Time: 04/17/15  6:27 PM  Result Value Ref Range   Specimen Description BLOOD RIGHT ARM    Special Requests IN PEDIATRIC BOTTLE 3ML    Culture      NO GROWTH < 24 HOURS Performed at [facility]    Report Status PENDING   I-Stat CG4 Lactic Acid, ED     Status: Abnormal   Collection Time: 04/17/15  8:13 PM  Result Value Ref Range   Lactic Acid, Venous 4.86 (HH) 0.5 - 2.0 mmol/L  MRSA PCR Screening     Status: Abnormal   Collection Time: 04/18/15  1:48 AM  Result Value Ref Range   MRSA by PCR POSITIVE (A) NEGATIVE    Comment:        The GeneXpert MRSA Assay (FDA approved for NASAL specimens only), is one component of a comprehensive MRSA colonization surveillance program. It is not intended to diagnose MRSA infection nor to guide or monitor treatment for MRSA  infections. RESULT CALLED TO, READ BACK BY AND VERIFIED WITH: Merceda Elks RN AT  [phone] ON 11.30.16 BY SHUEA   Protime-INR     Status: Abnormal   Collection Time: 04/18/15  4:10 AM  Result Value Ref Range   Prothrombin Time 15.6 (H) 11.6 - 15.2 seconds   INR 1.22 0.00 - 1.49  APTT     Status: None   Collection Time: 04/18/15  4:10 AM  Result Value Ref Range   aPTT 26 24 - 37 seconds  Magnesium     Status: None   Collection Time: 04/18/15  4:10 AM  Result Value Ref Range   Magnesium 2.4 1.7 - 2.4 mg/dL  Phosphorus     Status: Abnormal   Collection Time: 04/18/15  4:10 AM  Result Value Ref Range   Phosphorus 1.9 (L) 2.5 - 4.6 mg/dL  Lactic acid, plasma     Status: None   Collection Time: 04/18/15  4:10 AM  Result Value Ref Range   Lactic Acid, Venous 1.6 0.5 - 2.0 mmol/L  Procalcitonin     Status: None   Collection Time: 04/18/15  4:10 AM  Result Value Ref Range   Procalcitonin 0.44 ng/mL    Comment:        Interpretation: PCT (Procalcitonin) <=  0.5 ng/mL: Systemic infection (sepsis) is not likely. Local bacterial infection is possible. (NOTE)         ICU PCT Algorithm               Non ICU PCT Algorithm    ----------------------------     ------------------------------         PCT < 0.25 ng/mL                 PCT < 0.1 ng/mL     Stopping of antibiotics            Stopping of antibiotics       strongly encouraged.               strongly encouraged.    ----------------------------     ------------------------------       PCT level decrease by               PCT < 0.25 ng/mL       >= 80% from peak PCT       OR PCT 0.25 - 0.5 ng/mL          Stopping of antibiotics                                             encouraged.     Stopping of antibiotics           encouraged.    ----------------------------     ------------------------------       PCT level decrease by              PCT >= 0.25 ng/mL       < 80% from peak PCT        AND PCT >= 0.5 ng/mL            Continuin g  antibiotics                                              encouraged.       Continuing antibiotics            encouraged.    ----------------------------     ------------------------------     PCT level increase compared          PCT > 0.5 ng/mL         with peak PCT AND          PCT >= 0.5 ng/mL             Escalation of antibiotics                                          strongly encouraged.      Escalation of antibiotics        strongly encouraged.   Comprehensive metabolic panel     Status: Abnormal   Collection Time: 04/18/15  4:10 AM  Result Value Ref Range   Sodium 142 135 - 145 mmol/L   Potassium 4.0 3.5 - 5.1 mmol/L    Comment: DELTA CHECK NOTED REPEATED TO VERIFY NO VISIBLE HEMOLYSIS    Chloride 114 (H) 101 - 111 mmol/L   CO2  21 (L) 22 - 32 mmol/L   Glucose, Bld 149 (H) 65 - 99 mg/dL   BUN 26 (H) 6 - 20 mg/dL   Creatinine, Ser 0.92 0.61 - 1.24 mg/dL   Calcium 7.1 (L) 8.9 - 10.3 mg/dL   Total Protein 6.4 (L) 6.5 - 8.1 g/dL   Albumin 2.9 (L) 3.5 - 5.0 g/dL   AST 53 (H) 15 - 41 U/L   ALT 59 17 - 63 U/L   Alkaline Phosphatase 67 38 - 126 U/L   Total Bilirubin 1.6 (H) 0.3 - 1.2 mg/dL   GFR calc non Af Amer >60 >60 mL/min   GFR calc Af Amer >60 >60 mL/min    Comment: (NOTE) The eGFR has been calculated using the CKD EPI equation. This calculation has not been validated in all clinical situations. eGFR's persistently <60 mL/min signify possible Chronic Kidney Disease.    Anion gap 7 5 - 15  CBC     Status: Abnormal   Collection Time: 04/18/15  4:10 AM  Result Value Ref Range   WBC 14.9 (H) 4.0 - 10.5 K/uL   RBC 4.65 4.22 - 5.81 MIL/uL   Hemoglobin 11.5 (L) 13.0 - 17.0 g/dL   HCT 36.4 (L) 39.0 - 52.0 %   MCV 78.3 78.0 - 100.0 fL   MCH 24.7 (L) 26.0 - 34.0 pg   MCHC 31.6 30.0 - 36.0 g/dL   RDW 15.6 (H) 11.5 - 15.5 %   Platelets 229 150 - 400 K/uL  Glucose, capillary     Status: Abnormal   Collection Time: 04/18/15  7:49 AM  Result Value Ref Range    Glucose-Capillary 140 (H) 65 - 99 mg/dL   Recent Results (from the past 240 hour(s))  Urine culture     Status: None   Collection Time: 04/17/15  5:34 PM  Result Value Ref Range Status   Specimen Description URINE, CATHETERIZED  Final   Special Requests NONE  Final   Culture   Final    MULTIPLE SPECIES PRESENT, SUGGEST RECOLLECTION Performed at [facility]    Report Status 04/18/2015 FINAL  Final  Blood Culture (routine x 2)     Status: None (Preliminary result)   Collection Time: 04/17/15  6:27 PM  Result Value Ref Range Status   Specimen Description BLOOD RIGHT ARM  Final   Special Requests IN PEDIATRIC BOTTLE 3ML  Final   Culture   Final    NO GROWTH < 24 HOURS Performed at [facility]    Report Status PENDING  Incomplete  MRSA PCR Screening     Status: Abnormal   Collection Time: 04/18/15  1:48 AM  Result Value Ref Range Status   MRSA by PCR POSITIVE (A) NEGATIVE Final    Comment:        The GeneXpert MRSA Assay (FDA approved for NASAL specimens only), is one component of a comprehensive MRSA colonization surveillance program. It is not intended to diagnose MRSA infection nor to guide or monitor treatment for MRSA infections. RESULT CALLED TO, READ BACK BY AND VERIFIED WITH: Merceda Elks RN AT  [phone] ON 11.30.16 BY SHUEA    Creatinine:  Recent Labs  04/17/15 1748 04/18/15 0410  CREATININE 0.75 0.92    Xrays: See report/chart none  Impression/Assessment:  !6 Fr coude inserted- nurses did great job balancing patient   Plan:  F/up with Urology as scheduled  Josearmando Kuhnert A 04/18/2015, 8:23 PM

## 2015-04-18 NOTE — Progress Notes (Signed)
OT Cancellation Note  Patient Details Name: [name] Mccoy. Badders MRN: [id number] DOB: 08-06-1960   Cancelled Treatment:     Pt screened for OT; has end stage MS with contractures.  Leeana Creer 04/18/2015, 9:56 AM  Lesle Chris, OTR/L [phone] 04/18/2015

## 2015-04-18 NOTE — ED Notes (Signed)
Discussed with MD that is admitting and per MD RN to leave NG tube out at this time. If pt has active n&v, NG tube to be placed

## 2015-04-18 NOTE — H&P (Addendum)
[facility] History and Physical  [name] Mccoy. [name] Mccoy DOB: 1960-08-14 DOA: 04/17/2015  Referring physician: ED physician PCP: [name] Chou, NP  Specialists:   Chief Complaint: Nausea, vomiting and generalized weakness  HPI: [name] Mccoy is a 53 y.o. male with PMH of multiple sclerosis Diagnosed 23 33, currently advanced with baseline bedbound [last walked in February 2013] and being in contractures and muscle spasm, kidney stone, neurogenic bladder, DO NOT RESUSCITATE, who presents with nausea, vomiting, and generalized weakness.  Patient reports that he started having nausea and vomiting since yesterday. He vomited more than 15 times with coffee ground material wrist. He does not have fever, chills, abdominal pain, diarrhea. No hematochezia or hematuria. Patient denies symptoms of UTI. No chest pain, cough, shortness of breath.  In ED, patient was found to have positive urinalysis for UTI, lactate is 3.05--> 4.86, WBC 13.4, temperature 101.4, tachycardia, tachypnea, potassium 2.7, renal function okay. Chest x-ray showed chronic interstitial change. CT abdomen/pelvis showed air in the gastric wall diffusely consistent with pneumatosis. Evidence of perforated viscus with pneumoperitoneum left upper quadrant.Pneumatosis can be seen as evidence of wall necrosis related to ischemia or emphysematous gastritis; however, the gastric wall does not appear thickened currently and the possibility that pneumatotics may be of mechanical origin is consTheidered. Bilateral lower lobe infiltrates suggesting edema or possibly pneumonia/pneumonitis. Patient is admitted to inpatient for further interventional treatment. GI, PCCM and general surgeon were consulted.  Where does patient live?   At home  Can patient participate in ADLs?   None   Review of Systems:   General: has fevers, chills, no changes in body weight, has poor appetite, has fatigue HEENT: no blurry vision, hearing changes  or sore throat Pulm: no dyspnea, coughing, wheezing CV: no chest pain, palpitations Abd: has nausea, vomiting, no abdominal pain, no diarrhea, constipation GU: no dysuria, burning on urination, increased urinary frequency, hematuria  Ext: no leg edema Neuro: No vision change or hearing loss.  Skin: no rash MSK: No muscle spasm. Heme: No easy bruising.  Travel history: No recent long distant travel.  Allergy: No Known Allergies  Past Medical History  Diagnosis Date  . Multiple sclerosis ([place])   . Pressure ulcer, buttock(707.05)   . Pain in joint, pelvic region and thigh   . Spasm of muscle   . Unspecified vitamin D deficiency   . Disturbance of skin sensation   . Right ureteral stone 01/03/13  . Renal stones   . Neurogenic bladder     01/13/13 FOLEY CATH  . Urinary retention with incomplete bladder emptying 01/13/13    FOLEY CATHETER  . Neuromuscular disorder ([place])     multiple schlorosis since 1983  . Hyponatremia 01/27/2013  . DNR (do not resuscitate)     yellow copy    Past Surgical History  Procedure Laterality Date  . Wound debridement Right 10/15/2012    Procedure: DEBRIDMENT OF RIGHT HIP DECUBITUS AND SACRAL DECUBITUS;  Surgeon: Earnstine Regal, MD;  Location: [facility];  Service: General;  Laterality: Right;  left lateral position, right hip  . Nephrolithotomy Right 01/25/2013    Procedure: RIGHT PERCUTANEOUS NEPHROLITHOTOMY ;  Surgeon: Malka So, MD;  Location: [facility];  Service: Urology;  Laterality: Right;  . Kidney stone surgery      x 2    Social History:  reports that he has never smoked. He has never used smokeless tobacco. He reports that he does not drink alcohol or use illicit drugs.  Family History:  Family History  Problem Relation Age of Onset  . Hypertension Mother   . Dementia Mother   . Hypertension Father   . Heart disease Father      Prior to Admission medications   Medication Sig Start Date End Date Taking? Authorizing Provider  amantadine  (SYMMETREL) 100 MG capsule Take 100 mg by mouth 2 (two) times daily.    Yes Historical Provider, MD  cetirizine (ZYRTEC) 10 MG tablet  12/12/14  Yes Historical Provider, MD  dantrolene (DANTRIUM) 25 MG capsule Take 25 mg by mouth 3 (three) times daily.   Yes Historical Provider, MD  feeding supplement (ENSURE COMPLETE) LIQD Take 237 mLs by mouth 3 (three) times daily between meals. 09/21/12  Yes Modena Jansky, MD  gabapentin (NEURONTIN) 300 MG capsule TAKE 2 CAPSULES 4 TIMES DAILY. 04/07/14  Yes Philmore Pali, NP  gluconic acid-citric acid (RENACIDIN) irrigation Irrigate with 30 mLs as directed as directed. Every 1-3 days for foley. 03/08/15  Yes Historical Provider, MD  hydrOXYzine (ATARAX/VISTARIL) 25 MG tablet Take 25 mg by mouth every 4 (four) hours as needed for anxiety or itching.  05/16/14  Yes Historical Provider, MD  metoprolol succinate (TOPROL-XL) 25 MG 24 hr tablet Take 25 mg by mouth daily. 01/28/13  Yes Irine Seal, MD  montelukast (SINGULAIR) 10 MG tablet Take 10 mg by mouth every morning.  05/16/14  Yes Historical Provider, MD  Multiple Vitamin (MULTIVITAMIN WITH MINERALS) TABS Take 1 tablet by mouth daily. 09/21/12  Yes Modena Jansky, MD  nystatin (MYCOSTATIN) 100000 UNIT/ML suspension SWISH AND SWALLOW 5 MILLILITERS FOUR TIMES A DAY FOR 5 DAYS 12/06/14  Yes Historical Provider, MD  ondansetron (ZOFRAN) 4 MG tablet take 1 tablet by mouth three times a day AS NEEDED FOR NAUSEA. 12/16/14  Yes Historical Provider, MD  oxybutynin (DITROPAN) 5 MG tablet Take 5 mg by mouth 3 (three) times daily.   Yes Historical Provider, MD  oxyCODONE-acetaminophen (PERCOCET) 10-325 MG per tablet Take 1 tablet by mouth every 6 (six) hours as needed for pain.  06/07/14  Yes Historical Provider, MD  Peginterferon Beta-1a (PLEGRIDY) 125 MCG/0.5ML SOPN Inject 125 mcg into the skin every 14 (fourteen) days.   Yes Historical Provider, MD  simvastatin (ZOCOR) 20 MG tablet Take 20 mg by mouth at bedtime.    Yes  Historical Provider, MD  tiZANidine (ZANAFLEX) 4 MG tablet Take 4 mg by mouth 4 (four) times daily.    Yes Historical Provider, MD  triamcinolone (KENALOG) 0.025 % cream  12/12/14  Yes Historical Provider, MD    Physical Exam: Filed Vitals:   04/17/15 2352 04/18/15 0000 04/18/15 0015 04/18/15 0030  BP:  111/85 116/90 116/98  Pulse:  134 131 132  Temp: 98.8 F (37.1 C)     TempSrc: Oral     Resp:  16 16 22   Weight:      SpO2:  92% 95% 99%   General: Not in acute distress HEENT:       Eyes: PERRL, EOMI, no scleral icterus.       ENT: No discharge from the ears and nose, no pharynx injection, no tonsillar enlargement.        Neck: No JVD, no bruit, no mass felt. Heme: No neck lymph node enlargement. Cardiac: S1/S2, RRR, tachycardia,No murmurs, No gallops or rubs. Pulm: No rales, wheezing, rhonchi or rubs. Abd: Soft, mildly distended, nontender, no rebound pain, no organomegaly, BS present. Ext: No pitting leg edema bilaterally. 2+DP/PT pulse bilaterally. Musculoskeletal: No  joint deformities, No joint redness or warmth, no limitation of ROM in spin. Skin: No rashes. Has decubitus ulcer in buttock Neuro: Alert, oriented X3, cranial nerves II-XII grossly intact, has muscle spasm in all extremities. Psych: Patient is not psychotic, no suicidal or hemocidal ideation.  Labs on Admission:  Basic Metabolic Panel:  Recent Labs Lab 04/17/15 1748  NA 145  K 2.7*  CL 116*  CO2 19*  GLUCOSE 119*  BUN 33*  CREATININE 0.75  CALCIUM 6.3*   Liver Function Tests:  Recent Labs Lab 04/17/15 1748  AST 42*  ALT 42  ALKPHOS 60  BILITOT 0.9  PROT 5.8*  ALBUMIN 2.6*    Recent Labs Lab 04/17/15 1748  LIPASE 18   No results for input(s): AMMONIA in the last 168 hours. CBC:  Recent Labs Lab 04/17/15 1748  WBC 13.4*  NEUTROABS 10.9*  HGB 12.4*  HCT 38.8*  MCV 78.9  PLT 234   Cardiac Enzymes:  Recent Labs Lab 04/17/15 1748  CKTOTAL 24*    BNP (last 3 results) No  results for input(s): BNP in the last 8760 hours.  ProBNP (last 3 results) No results for input(s): PROBNP in the last 8760 hours.  CBG: No results for input(s): GLUCAP in the last 168 hours.  Radiological Exams on Admission: Ct Abdomen Pelvis W Contrast  04/17/2015  CLINICAL DATA:  Vomiting blood, tachycardia, altered mental status, possible viscus perforation EXAM: CT ABDOMEN AND PELVIS WITH CONTRAST TECHNIQUE: Multidetector CT imaging of the abdomen and pelvis was performed using the standard protocol following bolus administration of intravenous contrast. CONTRAST:  125mL OMNIPAQUE IOHEXOL 300 MG/ML  SOLN COMPARISON:  01/26/2013 FINDINGS: Lower chest: Interstitial change at both lung bases possibly representing pulmonary edema. There is motion artifact. Pneumonia/pneumonitis not excluded. Hepatobiliary: There is some gallbladder wall calcification as well as sludge similar to prior study Pancreas: Normal Spleen: Normal Adrenals/Urinary Tract: Adrenal glands are normal. A few small left renal cysts are present. There is a Foley catheter decompressing the bladder. Stomach/Bowel: Stomach is distended with air-fluid level. There is air in the wall of the stomach and there is pneumoperitoneum around the stomach. Fluid refluxes into the distended distal esophagus. No significant abnormalities of small or large bowel, which are relatively decompressed when compared to the stomach. Vascular/Lymphatic: No acute findings Reproductive: No acute abnormalities Other: Air extends through the esophageal hiatus into the lower mediastinum, although a greater volume of free air is seen below the diaphragm. Musculoskeletal: Limited evaluation due to suboptimal positioning. Evidence of prior deformed left femur fracture. 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 Evidence of perforated viscus with pneumoperitoneum left upper quadrant.Pneumatosis can be seen as evidence of wall necrosis  related to ischemia or emphysematous gastritis; however, the gastric wall does not appear thickened currently and the possibility that pneumatotics may be of mechanical origin is considered. Bilateral lower lobe infiltrates suggesting edema or possibly pneumonia/pneumonitis Critical Value/emergent results were called by telephone at the time of interpretation on 04/17/2015 at 9:41 pm to Dr. Noemi Chapel , who verbally acknowledged these results. Electronically Signed   By: Skipper Cliche M.D.   On: 04/17/2015 21:41   Dg Chest Port 1 View  04/17/2015  CLINICAL DATA:  Upper GI bleed, hematemesis EXAM: PORTABLE CHEST 1 VIEW COMPARISON:  03/18/2015 FINDINGS: Limited inspiratory effect. Uncoiling of the aorta. Normal heart size. Moderate diffuse interstitial change stable from prior study. There is gas in the left upper quadrant which is only partially visualized, which  may be entirely within the stomach. Possibility of left pneumoperitoneum not excluded. IMPRESSION: 1. Chronic interstitial lung disease 2. CT of the abdomen and pelvis recommended in light of clinical information. Pneumoperitoneum not excluded in the left upper abdomen. Electronically Signed   By: Skipper Cliche M.D.   On: 04/17/2015 18:15    EKG: Not done in ED, will get one.   Assessment/Plan Principal Problem:   Nausea & vomiting Active Problems:   Hypokalemia   Sepsis (HCC)   Decubitus ulcer of buttock   Spasm of muscle   Protein-calorie malnutrition, severe (HCC)   UTI (lower urinary tract infection)   Lactic acidosis  Nausea & vomiting with possible coffee ground materials: CT-abd/pelvis findings are concerning for gastric outlet obstruction with suspected gastric wall ischemia and possible perforation but no classic peritoneal signs. General surgeon was consulted. Per Dr. Hassell Done, pt dose not need surgery due to absence of peritoneal signs. GI was consulted due to possible hematemesis. Dr. Oletta Lamas recommended IV Protonix  empirically and EGD in AM probably by Dr Penelope Coop.  -will admit to SDU -Appreciate GI and surgeon's consultation/ -will start NG tube now -IVF as below  Sepsis: patient has severe sepsis with leukocytosis, fever, tachycardia, tachypnea and elevated lactate which is treating up 3.05-->4.86. This is likely due to UTI and gastritis/possible perforation of bowel. PCCM was consulted. -IV vancomycin plus Zosyn per Dr. [name]. -Aggressive IVF: 4L of NS bolus and then 100 cc/h -will get Procalcitonin and trend lactic acid levels per sepsis protocol. -Per Dr. [name], If needs vasopressors, will start Neo-Synephrine through peripheral line [central line can be very difficult in this patient with contractures]  Hypokalemia: K= 2.7 on admission. - Repleted K with 10 mEQ x 5 - 2 gram of Magnesium sulfate was also given - Check Mg level  UTI:  -on IV vancomycin and Zosyn -Follow-up a blood culture and urine culture  Decubitus ulcer of buttock: -consultation wound care team  Protein-calorie malnutrition, severe ([place]): -Ensure when able to eat  MS: At baseline he needs help with all his activities of daily living including bathing, toileting, feeding. He is on interferon-beta 1a treatment (plegdirdy) by Dr. [name] since August 2016 [prior GI intolerance with betasteron IFn-beta1b and tecfidera). - Continue home dantrolene, tizanidine, gabapentin and IFN-alpha -f/u with neurologist   DVT ppx: SCD  Code Status: DNR Family Communication: None at bed side.   Disposition Plan: Admit to inpatient   Date of Service 04/18/2015    Ivor Costa [facility] Pager [phone]  If 7PM-7AM, please contact night-coverage [email or web address] Password [email or web address] 04/18/2015, 12:52 AM

## 2015-04-18 NOTE — Telephone Encounter (Signed)
Received call from Santiago Glad with [facility] stating that pre Shirlee Limerick, patient's caregiver, patient went to ED yesterday, was admitted to [facility] with "perforated bowel".  Will inform Dr [name].

## 2015-04-18 NOTE — Progress Notes (Addendum)
4th floor RN attempted to insert coude catheter, but was unsuccessful. Third floor nurse notified to call Dr. Matilde Sprang with urology.

## 2015-04-18 NOTE — Consult Note (Signed)
PULMONARY / CRITICAL CARE MEDICINE   Name: [name] Mccoy. Dankers MRN: [id number] DOB: [birth year]    ADMISSION DATE:  04/17/2015 CONSULTATION DATE:  04/18/15  REFERRING MD :  Dr Noemi Chapel of [facility]  CHIEF COMPLAINT:  Sepsis syndrome , lactic acidosis  HISTORY OF PRESENT ILLNESS:   54 year old male with advanced multiple sclerosis. Multiple sclerosis in 1983. Currently advanced with baseline bedbound [last walked in February 2013] and being in contractures. At baseline he needs complete help with all his activities of daily living including bathing, toileting, feeding. On interferon-beta 1a treatment (plegdirdy) by Dr. [name] since August 2016 [prior GI intolerance with betasteron IFn-beta1b and tecfidera) or but also maintained on dantrolene, tizanidine, gabapentin, Provigil. He is also reported to have stage IV sacral decubiti ulcers. Most recent admission February 2016 due to urosepsis. His caregiver is his [place] he did he is listed as a DO NOT RESUSCITATE unclear what details  He was brought in for several days of intermittent nausea, generalized weakness and reported coffee-ground emesis but according to the caregiver history in the chart this no history of any upper GI bleeding. Upon presentation his service with lactic acidosis of 3 which then rose to nearly 5. CT abdomen showed possible pneumoperitoneum in the left upper abdomen. General surgery does not think is a surgical issue because of lack of peritoneal signs. Due to ongoing lactic acidosis critical care medicine has been asked to evaluate the patient 04/18/2015  According to the nurse patient is maintained blood pressure throughout but has been tachycardic. He has received ceftriaxone and vancomycin for possible UTI.  PAST MEDICAL HISTORY :  He  has a past medical history of Multiple sclerosis ([place]); Pressure ulcer, buttock(707.05); Pain in joint, pelvic region and thigh; Spasm of muscle; Unspecified vitamin D  deficiency; Disturbance of skin sensation; Right ureteral stone (01/03/13); Renal stones; Neurogenic bladder; Urinary retention with incomplete bladder emptying (01/13/13); Neuromuscular disorder ([place]); Hyponatremia (01/27/2013); and DNR (do not resuscitate).  PAST SURGICAL HISTORY: He  has past surgical history that includes Wound debridement (Right, 10/15/2012); Nephrolithotomy (Right, 01/25/2013); and Kidney stone surgery.  No Known Allergies  No current facility-administered medications on file prior to encounter.   Current Outpatient Prescriptions on File Prior to Encounter  Medication Sig  . amantadine (SYMMETREL) 100 MG capsule Take 100 mg by mouth 2 (two) times daily.   . cetirizine (ZYRTEC) 10 MG tablet   . dantrolene (DANTRIUM) 25 MG capsule Take 25 mg by mouth 3 (three) times daily.  . feeding supplement (ENSURE COMPLETE) LIQD Take 237 mLs by mouth 3 (three) times daily between meals.  . gabapentin (NEURONTIN) 300 MG capsule TAKE 2 CAPSULES 4 TIMES DAILY.  Marland Kitchen gluconic acid-citric acid (RENACIDIN) irrigation Irrigate with 30 mLs as directed as directed. Every 1-3 days for foley.  . hydrOXYzine (ATARAX/VISTARIL) 25 MG tablet Take 25 mg by mouth every 4 (four) hours as needed for anxiety or itching.   . metoprolol succinate (TOPROL-XL) 25 MG 24 hr tablet Take 25 mg by mouth daily.  . montelukast (SINGULAIR) 10 MG tablet Take 10 mg by mouth every morning.   . Multiple Vitamin (MULTIVITAMIN WITH MINERALS) TABS Take 1 tablet by mouth daily.  Marland Kitchen nystatin (MYCOSTATIN) 100000 UNIT/ML suspension SWISH AND SWALLOW 5 MILLILITERS FOUR TIMES A DAY FOR 5 DAYS  . ondansetron (ZOFRAN) 4 MG tablet take 1 tablet by mouth three times a day AS NEEDED FOR NAUSEA.  Marland Kitchen oxybutynin (DITROPAN) 5 MG tablet Take 5 mg by mouth  3 (three) times daily.  Marland Kitchen oxyCODONE-acetaminophen (PERCOCET) 10-325 MG per tablet Take 1 tablet by mouth every 6 (six) hours as needed for pain.   . Peginterferon Beta-1a (PLEGRIDY) 125 MCG/0.5ML  SOPN Inject 125 mcg into the skin every 14 (fourteen) days.  . simvastatin (ZOCOR) 20 MG tablet Take 20 mg by mouth at bedtime.   Marland Kitchen tiZANidine (ZANAFLEX) 4 MG tablet Take 4 mg by mouth 4 (four) times daily.   Marland Kitchen triamcinolone (KENALOG) 0.025 % cream     FAMILY HISTORY:  His indicated that his mother is deceased. He indicated that his father is deceased.   SOCIAL HISTORY: He  reports that he has never smoked. He has never used smokeless tobacco. He reports that he does not drink alcohol or use illicit drugs.  REVIEW OF SYSTEMS:   According to history of present illness  SUBJECTIVE:   VITAL SIGNS: BP 108/84 mmHg  Pulse 144  Temp(Src) 98.8 F (37.1 C) (Oral)  Resp 16  Wt 93.441 kg (206 lb)  SpO2 93%   PHYSICAL EXAMINATION: General:  Looks older than stated age. Looks chronically unwell and looks miserable Neuro:  Speaks softly. Alert. Very likely oriented 3 but did not evaluate and full HEENT:  Supple neck no neck nodes. Looks mildly dry Cardiovascular:  Tachycardic. Regular rate and rhythm. No murmurs Lungs:  Shallow respiration. No wheeze no crackles Abdomen:  Soft nontender Musculoskeletal:  Significant contractures especially of lower extremities Skin:  Reported sacral decub    LABS:  PULMONARY No results for input(s): PHART, PCO2ART, PO2ART, HCO3, TCO2, O2SAT in the last 168 hours.  Invalid input(s): PCO2, PO2  CBC  Recent Labs Lab 04/17/15 1748  HGB 12.4*  HCT 38.8*  WBC 13.4*  PLT 234    COAGULATION No results for input(s): INR in the last 168 hours.  CARDIAC  No results for input(s): TROPONINI in the last 168 hours. No results for input(s): PROBNP in the last 168 hours.   CHEMISTRY  Recent Labs Lab 04/17/15 1748  NA 145  K 2.7*  CL 116*  CO2 19*  GLUCOSE 119*  BUN 33*  CREATININE 0.75  CALCIUM 6.3*   CrCl cannot be calculated (Unknown ideal weight.).   LIVER  Recent Labs Lab 04/17/15 1748  AST 42*  ALT 42  ALKPHOS 60   BILITOT 0.9  PROT 5.8*  ALBUMIN 2.6*     INFECTIOUS  Recent Labs Lab 04/17/15 1756 04/17/15 2013  LATICACIDVEN 3.05* 4.86*     ENDOCRINE CBG (last 3)  No results for input(s): GLUCAP in the last 72 hours.       IMAGING x48h  - image(s) personally visualized  -   highlighted in bold Ct Abdomen Pelvis W Contrast  04/17/2015  CLINICAL DATA:  Vomiting blood, tachycardia, altered mental status, possible viscus perforation EXAM: CT ABDOMEN AND PELVIS WITH CONTRAST TECHNIQUE: Multidetector CT imaging of the abdomen and pelvis was performed using the standard protocol following bolus administration of intravenous contrast. CONTRAST:  142mL OMNIPAQUE IOHEXOL 300 MG/ML  SOLN COMPARISON:  01/26/2013 FINDINGS: Lower chest: Interstitial change at both lung bases possibly representing pulmonary edema. There is motion artifact. Pneumonia/pneumonitis not excluded. Hepatobiliary: There is some gallbladder wall calcification as well as sludge similar to prior study Pancreas: Normal Spleen: Normal Adrenals/Urinary Tract: Adrenal glands are normal. A few small left renal cysts are present. There is a Foley catheter decompressing the bladder. Stomach/Bowel: Stomach is distended with air-fluid level. There is air in the wall of the stomach  and there is pneumoperitoneum around the stomach. Fluid refluxes into the distended distal esophagus. No significant abnormalities of small or large bowel, which are relatively decompressed when compared to the stomach. Vascular/Lymphatic: No acute findings Reproductive: No acute abnormalities Other: Air extends through the esophageal hiatus into the lower mediastinum, although a greater volume of free air is seen below the diaphragm. Musculoskeletal: Limited evaluation due to suboptimal positioning. Evidence of prior deformed left femur fracture. 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 Evidence of perforated viscus with pneumoperitoneum  left upper quadrant.Pneumatosis can be seen as evidence of wall necrosis related to ischemia or emphysematous gastritis; however, the gastric wall does not appear thickened currently and the possibility that pneumatotics may be of mechanical origin is considered. Bilateral lower lobe infiltrates suggesting edema or possibly pneumonia/pneumonitis Critical Value/emergent results were called by telephone at the time of interpretation on 04/17/2015 at 9:41 pm to Dr. Noemi Chapel , who verbally acknowledged these results. Electronically Signed   By: Skipper Cliche M.D.   On: 04/17/2015 21:41   Dg Chest Port 1 View  04/17/2015  CLINICAL DATA:  Upper GI bleed, hematemesis EXAM: PORTABLE CHEST 1 VIEW COMPARISON:  03/18/2015 FINDINGS: Limited inspiratory effect. Uncoiling of the aorta. Normal heart size. Moderate diffuse interstitial change stable from prior study. There is gas in the left upper quadrant which is only partially visualized, which may be entirely within the stomach. Possibility of left pneumoperitoneum not excluded. IMPRESSION: 1. Chronic interstitial lung disease 2. CT of the abdomen and pelvis recommended in light of clinical information. Pneumoperitoneum not excluded in the left upper abdomen. Electronically Signed   By: Skipper Cliche M.D.   On: 04/17/2015 18:15       DISCUSSION: Sepsis syndrome without hyp0tension but lactic acidosis secondary to asked to come out with obstruction possibly with gastric wall ischemia and possible perforation. Treatment with fluids and antibiotics. Surgery recommends conservative line of care based on the patient's wishes and DO NOT RESUSCITATE status and lack of peritoneal signs. Hospitalist admitted with critical care medicine's consultation. Use pressors through peripheral line if needed     ASSESSMENT / PLAN:  PULMONARY A: Maintaining respiratory status P:   Monitor DO NOT RESUSCITATE status patient  CARDIOVASCULAR A:  SIRS with lactic  acidosis P:  Give third and fourth liter fluid bolus and then 100 mL per hour of normal saline Tract lactic acid If needs vasopressors start Neo-Synephrine through peripheral line [central line can be very difficult in this patient with contractures]  RENAL A:   At risk for kidney insufficiency acute Currently lactic acidosis and hy0kalemia P:   Check magnesium and phosphorus Replete potassium  GASTROINTESTINAL A:   Concern for gastric outlet obstruction with suspected gastric wall ischemia and possible perforation but no cklassic peritoneal signs P:   NG tube decompression Conservative line of care according to the patient's wishes and per surgery recommendation Per general surgery  HEMATOLOGIC A:   At risk for anemia of critical illness P:  Packed red blood cells for hemoglobin less than 7 g percent  INFECTIOUS A:   Sepsis syndrome secondary to gastric ischemia. Possible role from sacral decub ulcers P:   Vancomycin and Zosyn  ENDOCRINE A:   Nil Acute P:   Per [facility] hospitalist  NEUROLOGIC A:   Advanced multiple sclerosis on interferon beta 1A treatment P:   Might need neurology consult - ? Or GI side effects related to interferon alfa treatment Decision on continuing his oral  neurologic agents need to be made either by [facility] hospitalist or by neurology   FAMILY  - Updates: Reported DO NOT RESUSCITATE. Hospitalist to reconfirm this with the DP OA     The patient is critically ill with multiple organ systems failure and requires high complexity decision making for assessment and support, frequent evaluation and titration of therapies, application of advanced monitoring technologies and extensive interpretation of multiple databases.   Critical Care Time devoted to patient care services described in this note is  30  Minutes. This time reflects time of care of this signee Dr Brand Males. This critical care time does not reflect procedure time, or teaching  time or supervisory time of PA/NP/Med student/Med Resident etc but could involve care discussion time    Dr. Brand Males, M.D., [facility].C.P Pulmonary and Critical Care Medicine Staff Physician [facility] Pager: [phone], If no answer or between  15:00h - 7:00h: call [phone]  04/18/2015 12:32 AM

## 2015-04-18 NOTE — Consult Note (Addendum)
WOC wound consult note Reason for Consult: Pressure injuries Wound type:Pressure Pressure Ulcer POA: Yes Measurement:Right lateral knee:  Healing Stage 3 pressure injury measuring 3cm x 3cm with 1.5cm x 1cm area in center that is last remaining area to repair-currently partial thickness with dried serum obscuring depth (scab). Right hip: Healed Stage 4 pressure injury with 6 small intact, serum-filled blisters within the wound domain.  The largest blister is located at 4 o'clock and measures 2.5cm x 0.8cm. Two areas in the sacral and left buttock regions: healing Stage 3 pressure ulcers with small partial thickness tissue loss in each, sacral measures 1cm x 0.4cm and left buttock: 0.4cm round. Wound bed: As described above Drainage (amount, consistency, odor) none Periwound:Intact with evidence of previous wound healing Dressing procedure/placement/frequency: I will use a soft silicone foam dressing on the left lateral knee and protect the serum-filled blisters within the healed Stage 4 pressure injury with a petrolatum gauze while here.  In the ICU, patient is on a therapeutic mattress with low air loss feature; I will provide guidance for the Nursing staff to order this therapy upon transfer from the ICU to the floor. [facility] nursing team will not follow, but will remain available to this patient, the nursing and medical teams.  Please re-consult if needed. Thanks, Maudie Flakes, MSN, RN, [place], Ar[facility]  Pager# [phone]

## 2015-04-18 NOTE — Progress Notes (Signed)
Nutrition Brief Note  Patient identified on the [facility] Report  Wt Readings from Last 15 Encounters:  04/18/15 197 lb 1.5 oz (89.4 kg)  07/15/13 135 lb (61.236 kg)  04/13/13 126 lb 5.2 oz (57.3 kg)  04/03/13 135 lb (61.236 kg)  01/29/13 127 lb 13.9 oz (58 kg)  01/20/13 123 lb 0.3 oz (55.8 kg)  01/08/13 139 lb 5.3 oz (63.2 kg)  11/09/12 134 lb (60.782 kg)  10/14/12 139 lb 5.3 oz (63.2 kg)  09/17/12 136 lb 11 oz (62 kg)  07/26/11 141 lb 12.1 oz (64.3 kg)    Pt has 3 Stg II Pressure Ulcers & 1 Stg I. Pt has no issues chewing or swallowing, or with appetite.   Pt has increased nutrient needs related to pressure ulcers as evidenced by estimated needs.  Pt has gained significant weight since previous admission almost 1.75 years ago.   Monitor for diet advancement, provide Pro-Stat 75ml x2 when possible.  Body mass index is 26.7 kg/(m^2). Patient meets criteria for overweight based on current BMI.   Current diet order is NPO, patient is consuming no meals at this time.  No nutrition interventions warranted at this time. If nutrition issues arise, please consult RD.   [name] Mccoy. [name] Foots, MS, RD LDN After Hours/Weekend Pager [phone]

## 2015-04-18 NOTE — Progress Notes (Signed)
[facility] Progress Note  Subjective: No major complaints today.  He pulled out his NG tube  CT scan showed evidence of gastric distention, pneumatosis of stomach, free air probably from small gastric perforation  Objective: Vital signs in last 24 hours: Temp:  [98.5 F (36.9 C)-101.4 F (38.6 C)] 100 F (37.8 C) (11/30 0800) Pulse Rate:  [112-145] 116 (11/30 0800) Resp:  [10-28] 17 (11/30 0800) BP: (92-144)/(52-107) 120/89 mmHg (11/30 0800) SpO2:  [88 %-99 %] 99 % (11/30 0800) Weight:  [89.4 kg (197 lb 1.5 oz)-93.441 kg (206 lb)] 89.4 kg (197 lb 1.5 oz) (11/30 0400) Weight change:    PE:  No distress  Heart regular rhythm  Abdomen soft and nontender  Lab Results: Results for orders placed or performed during the hospital encounter of 04/17/15 (from the past 24 hour(s))  Urinalysis, Routine w reflex microscopic (not at [facility])     Status: Abnormal   Collection Time: 04/17/15  5:34 PM  Result Value Ref Range   Color, Urine YELLOW YELLOW   APPearance TURBID (A) CLEAR   Specific Gravity, Urine 1.024 1.005 - 1.030   pH 8.0 5.0 - 8.0   Glucose, UA NEGATIVE NEGATIVE mg/dL   Hgb urine dipstick LARGE (A) NEGATIVE   Bilirubin Urine SMALL (A) NEGATIVE   Ketones, ur NEGATIVE NEGATIVE mg/dL   Protein, ur 100 (A) NEGATIVE mg/dL   Nitrite NEGATIVE NEGATIVE   Leukocytes, UA LARGE (A) NEGATIVE  Urine culture     Status: None (Preliminary result)   Collection Time: 04/17/15  5:34 PM  Result Value Ref Range   Specimen Description URINE, CATHETERIZED    Special Requests NONE    Culture      CULTURE REINCUBATED FOR BETTER GROWTH Performed at [facility]    Report Status PENDING   Urine microscopic-add on     Status: Abnormal   Collection Time: 04/17/15  5:34 PM  Result Value Ref Range   Squamous Epithelial / LPF 0-5 (A) NONE SEEN   WBC, UA TOO NUMEROUS TO COUNT 0 - 5 WBC/hpf   RBC / HPF TOO NUMEROUS TO COUNT 0 - 5 RBC/hpf   Bacteria, UA MANY (A) NONE SEEN    Crystals TRIPLE PHOSPHATE CRYSTALS (A) NEGATIVE  Comprehensive metabolic panel     Status: Abnormal   Collection Time: 04/17/15  5:48 PM  Result Value Ref Range   Sodium 145 135 - 145 mmol/L   Potassium 2.7 (LL) 3.5 - 5.1 mmol/L   Chloride 116 (H) 101 - 111 mmol/L   CO2 19 (L) 22 - 32 mmol/L   Glucose, Bld 119 (H) 65 - 99 mg/dL   BUN 33 (H) 6 - 20 mg/dL   Creatinine, Ser 0.75 0.61 - 1.24 mg/dL   Calcium 6.3 (LL) 8.9 - 10.3 mg/dL   Total Protein 5.8 (L) 6.5 - 8.1 g/dL   Albumin 2.6 (L) 3.5 - 5.0 g/dL   AST 42 (H) 15 - 41 U/L   ALT 42 17 - 63 U/L   Alkaline Phosphatase 60 38 - 126 U/L   Total Bilirubin 0.9 0.3 - 1.2 mg/dL   GFR calc non Af Amer >60 >60 mL/min   GFR calc Af Amer >60 >60 mL/min   Anion gap 10 5 - 15  CBC WITH DIFFERENTIAL     Status: Abnormal   Collection Time: 04/17/15  5:48 PM  Result Value Ref Range   WBC 13.4 (H) 4.0 - 10.5 K/uL   RBC 4.92 4.22 - 5.81  MIL/uL   Hemoglobin 12.4 (L) 13.0 - 17.0 g/dL   HCT 38.8 (L) 39.0 - 52.0 %   MCV 78.9 78.0 - 100.0 fL   MCH 25.2 (L) 26.0 - 34.0 pg   MCHC 32.0 30.0 - 36.0 g/dL   RDW 15.7 (H) 11.5 - 15.5 %   Platelets 234 150 - 400 K/uL   Neutrophils Relative % 81 %   Neutro Abs 10.9 (H) 1.7 - 7.7 K/uL   Lymphocytes Relative 10 %   Lymphs Abs 1.4 0.7 - 4.0 K/uL   Monocytes Relative 9 %   Monocytes Absolute 1.2 (H) 0.1 - 1.0 K/uL   Eosinophils Relative 0 %   Eosinophils Absolute 0.0 0.0 - 0.7 K/uL   Basophils Relative 0 %   Basophils Absolute 0.0 0.0 - 0.1 K/uL  Lipase, blood     Status: None   Collection Time: 04/17/15  5:48 PM  Result Value Ref Range   Lipase 18 11 - 51 U/L  CK     Status: Abnormal   Collection Time: 04/17/15  5:48 PM  Result Value Ref Range   Total CK 24 (L) 49 - 397 U/L  I-Stat CG4 Lactic Acid, ED  (not at  [facility])     Status: Abnormal   Collection Time: 04/17/15  5:56 PM  Result Value Ref Range   Lactic Acid, Venous 3.05 (HH) 0.5 - 2.0 mmol/L   Comment NOTIFIED PHYSICIAN   POC occult blood,  ED RN will collect     Status: Abnormal   Collection Time: 04/17/15  5:56 PM  Result Value Ref Range   Fecal Occult Bld POSITIVE (A) NEGATIVE  Type and screen     Status: None   Collection Time: 04/17/15  6:25 PM  Result Value Ref Range   ABO/RH(D) A POS    Antibody Screen NEG    Sample Expiration 04/20/2015   Blood Culture (routine x 2)     Status: None (Preliminary result)   Collection Time: 04/17/15  6:27 PM  Result Value Ref Range   Specimen Description BLOOD RIGHT ARM    Special Requests IN PEDIATRIC BOTTLE 3ML    Culture      NO GROWTH < 24 HOURS Performed at [facility]    Report Status PENDING   I-Stat CG4 Lactic Acid, ED     Status: Abnormal   Collection Time: 04/17/15  8:13 PM  Result Value Ref Range   Lactic Acid, Venous 4.86 (HH) 0.5 - 2.0 mmol/L  MRSA PCR Screening     Status: Abnormal   Collection Time: 04/18/15  1:48 AM  Result Value Ref Range   MRSA by PCR POSITIVE (A) NEGATIVE  Protime-INR     Status: Abnormal   Collection Time: 04/18/15  4:10 AM  Result Value Ref Range   Prothrombin Time 15.6 (H) 11.6 - 15.2 seconds   INR 1.22 0.00 - 1.49  APTT     Status: None   Collection Time: 04/18/15  4:10 AM  Result Value Ref Range   aPTT 26 24 - 37 seconds  Magnesium     Status: None   Collection Time: 04/18/15  4:10 AM  Result Value Ref Range   Magnesium 2.4 1.7 - 2.4 mg/dL  Phosphorus     Status: Abnormal   Collection Time: 04/18/15  4:10 AM  Result Value Ref Range   Phosphorus 1.9 (L) 2.5 - 4.6 mg/dL  Lactic acid, plasma     Status: None   Collection Time:  04/18/15  4:10 AM  Result Value Ref Range   Lactic Acid, Venous 1.6 0.5 - 2.0 mmol/L  Procalcitonin     Status: None   Collection Time: 04/18/15  4:10 AM  Result Value Ref Range   Procalcitonin 0.44 ng/mL  Comprehensive metabolic panel     Status: Abnormal   Collection Time: 04/18/15  4:10 AM  Result Value Ref Range   Sodium 142 135 - 145 mmol/L   Potassium 4.0 3.5 - 5.1 mmol/L   Chloride  114 (H) 101 - 111 mmol/L   CO2 21 (L) 22 - 32 mmol/L   Glucose, Bld 149 (H) 65 - 99 mg/dL   BUN 26 (H) 6 - 20 mg/dL   Creatinine, Ser 0.92 0.61 - 1.24 mg/dL   Calcium 7.1 (L) 8.9 - 10.3 mg/dL   Total Protein 6.4 (L) 6.5 - 8.1 g/dL   Albumin 2.9 (L) 3.5 - 5.0 g/dL   AST 53 (H) 15 - 41 U/L   ALT 59 17 - 63 U/L   Alkaline Phosphatase 67 38 - 126 U/L   Total Bilirubin 1.6 (H) 0.3 - 1.2 mg/dL   GFR calc non Af Amer >60 >60 mL/min   GFR calc Af Amer >60 >60 mL/min   Anion gap 7 5 - 15  CBC     Status: Abnormal   Collection Time: 04/18/15  4:10 AM  Result Value Ref Range   WBC 14.9 (H) 4.0 - 10.5 K/uL   RBC 4.65 4.22 - 5.81 MIL/uL   Hemoglobin 11.5 (L) 13.0 - 17.0 g/dL   HCT 36.4 (L) 39.0 - 52.0 %   MCV 78.3 78.0 - 100.0 fL   MCH 24.7 (L) 26.0 - 34.0 pg   MCHC 31.6 30.0 - 36.0 g/dL   RDW 15.6 (H) 11.5 - 15.5 %   Platelets 229 150 - 400 K/uL  Glucose, capillary     Status: Abnormal   Collection Time: 04/18/15  7:49 AM  Result Value Ref Range   Glucose-Capillary 140 (H) 65 - 99 mg/dL    Studies/Results: Ct Abdomen Pelvis W Contrast  04/17/2015  CLINICAL DATA:  Vomiting blood, tachycardia, altered mental status, possible viscus perforation EXAM: CT ABDOMEN AND PELVIS WITH CONTRAST TECHNIQUE: Multidetector CT imaging of the abdomen and pelvis was performed using the standard protocol following bolus administration of intravenous contrast. CONTRAST:  149mL OMNIPAQUE IOHEXOL 300 MG/ML  SOLN COMPARISON:  01/26/2013 FINDINGS: Lower chest: Interstitial change at both lung bases possibly representing pulmonary edema. There is motion artifact. Pneumonia/pneumonitis not excluded. Hepatobiliary: There is some gallbladder wall calcification as well as sludge similar to prior study Pancreas: Normal Spleen: Normal Adrenals/Urinary Tract: Adrenal glands are normal. A few small left renal cysts are present. There is a Foley catheter decompressing the bladder. Stomach/Bowel: Stomach is distended with  air-fluid level. There is air in the wall of the stomach and there is pneumoperitoneum around the stomach. Fluid refluxes into the distended distal esophagus. No significant abnormalities of small or large bowel, which are relatively decompressed when compared to the stomach. Vascular/Lymphatic: No acute findings Reproductive: No acute abnormalities Other: Air extends through the esophageal hiatus into the lower mediastinum, although a greater volume of free air is seen below the diaphragm. Musculoskeletal: Limited evaluation due to suboptimal positioning. Evidence of prior deformed left femur fracture. 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 Evidence of perforated viscus with pneumoperitoneum left upper quadrant.Pneumatosis can be seen as evidence of wall necrosis related to ischemia or  emphysematous gastritis; however, the gastric wall does not appear thickened currently and the possibility that pneumatotics may be of mechanical origin is considered. Bilateral lower lobe infiltrates suggesting edema or possibly pneumonia/pneumonitis Critical Value/emergent results were called by telephone at the time of interpretation on 04/17/2015 at 9:41 pm to Dr. Noemi Chapel , who verbally acknowledged these results. Electronically Signed   By: Skipper Cliche M.D.   On: 04/17/2015 21:41   Dg Chest Port 1 View  04/17/2015  CLINICAL DATA:  Upper GI bleed, hematemesis EXAM: PORTABLE CHEST 1 VIEW COMPARISON:  03/18/2015 FINDINGS: Limited inspiratory effect. Uncoiling of the aorta. Normal heart size. Moderate diffuse interstitial change stable from prior study. There is gas in the left upper quadrant which is only partially visualized, which may be entirely within the stomach. Possibility of left pneumoperitoneum not excluded. IMPRESSION: 1. Chronic interstitial lung disease 2. CT of the abdomen and pelvis recommended in light of clinical information. Pneumoperitoneum not excluded in the left  upper abdomen. Electronically Signed   By: Skipper Cliche M.D.   On: 04/17/2015 18:15      Assessment: We will hold on EGD at this time given free air probably from small gastric perforation. It is possible that endoscopy with air insufflation of the stomach could worsen things at this time. I do believe at some point EGD will be done to rule out a distal gastric lesion causing obstruction, but given the acute nature of the problem right now I don't feel air insufflation in the stomach is safe.  Plan:   Follow clinically.    Cassell Clement 04/18/2015, 2:11 PM  Pager: [phone] If no answer or after 5 PM call [phone]

## 2015-04-18 NOTE — Telephone Encounter (Signed)
LVM for Santiago Glad with [employer], requested call back. Left this caller's name, number.

## 2015-04-18 NOTE — Progress Notes (Signed)
Patient ID: [name] Mccoy. [name] Mccoy, male   DOB: 1961-01-10, 54 y.o.   MRN: [id number]    Subjective: Patient denies abdominal pain or other complaints this morning. NG was placed last night and return 700 mL but patient pulled it out overnight.  Objective: Vital signs in last 24 hours: Temp:  [98.5 F (36.9 C)-101.4 F (38.6 C)] 100 F (37.8 C) (11/30 0800) Pulse Rate:  [112-145] 116 (11/30 0800) Resp:  [10-28] 17 (11/30 0800) BP: (92-144)/(52-107) 120/89 mmHg (11/30 0800) SpO2:  [88 %-99 %] 99 % (11/30 0800) Weight:  [89.4 kg (197 lb 1.5 oz)-93.441 kg (206 lb)] 89.4 kg (197 lb 1.5 oz) (11/30 0400) Last BM Date: 04/17/15  Intake/Output from previous day: 11/29 0701 - 11/30 0700 In: 1093.3 [I.V.:493.3; IV Piggyback:600] Out: 1050 [Urine:350; Emesis/NG output:700] Intake/Output this shift:    General appearance: alert, cooperative and no distress GI: normal findings: soft, non-tender and without apparent distention  Lab Results:   Recent Labs  04/17/15 1748 04/18/15 0410  WBC 13.4* 14.9*  HGB 12.4* 11.5*  HCT 38.8* 36.4*  PLT 234 229   BMET  Recent Labs  04/17/15 1748 04/18/15 0410  NA 145 142  K 2.7* 4.0  CL 116* 114*  CO2 19* 21*  GLUCOSE 119* 149*  BUN 33* 26*  CREATININE 0.75 0.92  CALCIUM 6.3* 7.1*     Studies/Results: Ct Abdomen Pelvis W Contrast  04/17/2015  CLINICAL DATA:  Vomiting blood, tachycardia, altered mental status, possible viscus perforation EXAM: CT ABDOMEN AND PELVIS WITH CONTRAST TECHNIQUE: Multidetector CT imaging of the abdomen and pelvis was performed using the standard protocol following bolus administration of intravenous contrast. CONTRAST:  126mL OMNIPAQUE IOHEXOL 300 MG/ML  SOLN COMPARISON:  01/26/2013 FINDINGS: Lower chest: Interstitial change at both lung bases possibly representing pulmonary edema. There is motion artifact. Pneumonia/pneumonitis not excluded. Hepatobiliary: There is some gallbladder wall calcification as well as sludge  similar to prior study Pancreas: Normal Spleen: Normal Adrenals/Urinary Tract: Adrenal glands are normal. A few small left renal cysts are present. There is a Foley catheter decompressing the bladder. Stomach/Bowel: Stomach is distended with air-fluid level. There is air in the wall of the stomach and there is pneumoperitoneum around the stomach. Fluid refluxes into the distended distal esophagus. No significant abnormalities of small or large bowel, which are relatively decompressed when compared to the stomach. Vascular/Lymphatic: No acute findings Reproductive: No acute abnormalities Other: Air extends through the esophageal hiatus into the lower mediastinum, although a greater volume of free air is seen below the diaphragm. Musculoskeletal: Limited evaluation due to suboptimal positioning. Evidence of prior deformed left femur fracture. 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 Evidence of perforated viscus with pneumoperitoneum left upper quadrant.Pneumatosis can be seen as evidence of wall necrosis related to ischemia or emphysematous gastritis; however, the gastric wall does not appear thickened currently and the possibility that pneumatotics may be of mechanical origin is considered. Bilateral lower lobe infiltrates suggesting edema or possibly pneumonia/pneumonitis Critical Value/emergent results were called by telephone at the time of interpretation on 04/17/2015 at 9:41 pm to Dr. Noemi Chapel , who verbally acknowledged these results. Electronically Signed   By: Skipper Cliche M.D.   On: 04/17/2015 21:41   Dg Chest Port 1 View  04/17/2015  CLINICAL DATA:  Upper GI bleed, hematemesis EXAM: PORTABLE CHEST 1 VIEW COMPARISON:  03/18/2015 FINDINGS: Limited inspiratory effect. Uncoiling of the aorta. Normal heart size. Moderate diffuse interstitial change stable from prior study. There is  gas in the left upper quadrant which is only partially visualized, which may be  entirely within the stomach. Possibility of left pneumoperitoneum not excluded. IMPRESSION: 1. Chronic interstitial lung disease 2. CT of the abdomen and pelvis recommended in light of clinical information. Pneumoperitoneum not excluded in the left upper abdomen. Electronically Signed   By: Skipper Cliche M.D.   On: 04/17/2015 18:15    Anti-infectives: Anti-infectives    Start     Dose/Rate Route Frequency Ordered Stop   04/18/15 0600  vancomycin (VANCOCIN) IVPB 1000 mg/200 mL premix     1,000 mg 200 mL/hr over 60 Minutes Intravenous Every 8 hours 04/18/15 0102     04/18/15 0030  piperacillin-tazobactam (ZOSYN) IVPB 3.375 g     3.375 g 12.5 mL/hr over 240 Minutes Intravenous 3 times per day 04/18/15 0022     04/17/15 2145  vancomycin (VANCOCIN) IVPB 1000 mg/200 mL premix     1,000 mg 200 mL/hr over 60 Minutes Intravenous  Once 04/17/15 2138 04/17/15 2256   04/17/15 1845  cefTRIAXone (ROCEPHIN) 1 g in dextrose 5 % 50 mL IVPB     1 g 100 mL/hr over 30 Minutes Intravenous  Once 04/17/15 1834 04/17/15 2053      Assessment/Plan: Patient with severe MS, end-stage presents with  Marked gastric distention and pneumatosis as well as some free air in the upper abdomen Questionable small gastric perforation although gastric wall otherwise appears normal.. Possible  Mechanical pneumatosis secondary to severe distention.Question gastric ileus secondary to  autonomic dysfunction versus  Mechanical gastric outlet obstruction. Currently patient stable although some tachycardia. Lactic acidosis has cleared. Abdominal exam remains benign. Would continue close observation and antibiotics for now and  EGD planned for later today. Would leave NG in place at that time if the stomach found to be distended.    LOS: 0 days    [name] T 04/18/2015

## 2015-04-18 NOTE — Care Management Note (Signed)
Case Management Note  Patient Details  Name: [name] Mccoy. Crus MRN: [id number] Date of Birth: 10-15-1960  Subjective/Objective:                 sepsis with confusion  Action/Plan:Date: April 18, 2015 Chart reviewed for concurrent status and case management needs. Will continue to follow patient for changes and needs: Velva Harman, RN, BSN, [place]   [phone]  Expected Discharge Date:   (unknown)               Expected Discharge Plan:  Home/Self Care  In-House Referral:  NA  Discharge planning Services  CM Consult  Post Acute Care Choice:  NA Choice offered to:  NA  DME Arranged:    DME Agency:     HH Arranged:    HH Agency:     Status of Service:  In process, will continue to follow  Medicare Important Message Given:    Date Medicare IM Given:    Medicare IM give by:    Date Additional Medicare IM Given:    Additional Medicare Important Message give by:     If discussed at [place] of Stay Meetings, dates discussed:    Additional Comments:  Leeroy Cha, RN 04/18/2015, 9:59 AM

## 2015-04-18 NOTE — Progress Notes (Signed)
Attempted to insert a 18 Fr Coude catheter per MD orders. Unable to successfully place foley as resistance was met. Bedside RN to notify MD of unsuccessful attempt.   Othella Boyer [facility] 04/18/2015 4:43 PM

## 2015-04-18 NOTE — Telephone Encounter (Signed)
Noted  

## 2015-04-18 NOTE — Progress Notes (Signed)
PT Cancellation Note  Patient Details Name: [name] Mccoy. [name] MRN: [id number] DOB: [birth year]   Cancelled Treatment:    Reason Eval/Treat Not Completed: PT screened, no needs identified, will sign off (patient is bedbound PTA)   [name] 04/18/2015, 12:25 PM [name] PT [phone]

## 2015-04-18 NOTE — Consult Note (Signed)
PULMONARY / CRITICAL CARE MEDICINE   Name: [name] Mccoy. [name] Mccoy MRN: [id number] DOB: 27-Sep-1960    ADMISSION DATE:  04/17/2015 CONSULTATION DATE:  04/18/15  REFERRING MD :  Dr Noemi Chapel of [facility]  CHIEF COMPLAINT:  Sepsis syndrome , lactic acidosis  HISTORY OF PRESENT ILLNESS:   54 year old male with advanced multiple sclerosis. Multiple sclerosis in 1983. Currently advanced with baseline bedbound [last walked in February 2013] and being in contractures. At baseline he needs complete help with all his activities of daily living including bathing, toileting, feeding. On interferon-beta 1a treatment (plegdirdy) by Dr. [name] since August 2016 [prior GI intolerance with betasteron IFn-beta1b and tecfidera) or but also maintained on dantrolene, tizanidine, gabapentin, Provigil. He is also reported to have stage IV sacral decubiti ulcers. Most recent admission February 2016 due to urosepsis. His caregiver is his [place] he did he is listed as a DO NOT RESUSCITATE unclear what details  He was brought in for several days of intermittent nausea, generalized weakness and reported coffee-ground emesis but according to the caregiver history in the chart this no history of any upper GI bleeding. Upon presentation his service with lactic acidosis of 3 which then rose to nearly 5. CT abdomen showed possible pneumoperitoneum in the left upper abdomen. General surgery does not think is a surgical issue because of lack of peritoneal signs. Due to ongoing lactic acidosis critical care medicine has been asked to evaluate the patient 04/18/2015  According to the nurse patient is maintained blood pressure throughout but has been tachycardic. He has received ceftriaxone and vancomycin for possible UTI.   SUBJECTIVE:  Pt reports "feeling a little bit better"  VITAL SIGNS: BP 120/89 mmHg  Pulse 116  Temp(Src) 100 F (37.8 C) (Rectal)  Resp 17  Ht 6' 0.05" (1.83 m)  Wt 89.4 kg (197 lb 1.5 oz)   BMI 26.70 kg/m2  SpO2 99%   PHYSICAL EXAMINATION: General:  Chronically ill appearing male, resting in bed in NAD  Neuro:  A&Ox2. Speaks soft, in full sentences. No focal deficits  HEENT:  NCAT, MMM  Cardiovascular:  Tachycardic. S1/S2, No murmurs Lungs:  Shallow respiration. No wheeze or crackles  Abdomen:  Soft, nontender, mildly distended, +bowel sounds  Musculoskeletal:  Significant contractures especially of lower extremities Skin:  Reported sacral decub    LABS:  PULMONARY No results for input(s): PHART, PCO2ART, PO2ART, HCO3, TCO2, O2SAT in the last 168 hours.  Invalid input(s): PCO2, PO2  CBC  Recent Labs Lab 04/17/15 1748 04/18/15 0410  HGB 12.4* 11.5*  HCT 38.8* 36.4*  WBC 13.4* 14.9*  PLT 234 229    COAGULATION  Recent Labs Lab 04/18/15 0410  INR 1.22    CARDIAC  No results for input(s): TROPONINI in the last 168 hours. No results for input(s): PROBNP in the last 168 hours.   CHEMISTRY  Recent Labs Lab 04/17/15 1748 04/18/15 0410  NA 145 142  K 2.7* 4.0  CL 116* 114*  CO2 19* 21*  GLUCOSE 119* 149*  BUN 33* 26*  CREATININE 0.75 0.92  CALCIUM 6.3* 7.1*  MG  --  2.4  PHOS  --  1.9*   Estimated Creatinine Clearance: 103.2 mL/min (by C-G formula based on Cr of 0.92).   LIVER  Recent Labs Lab 04/17/15 1748 04/18/15 0410  AST 42* 53*  ALT 42 59  ALKPHOS 60 67  BILITOT 0.9 1.6*  PROT 5.8* 6.4*  ALBUMIN 2.6* 2.9*  INR  --  1.22  INFECTIOUS  Recent Labs Lab 04/17/15 1756 04/17/15 2013 04/18/15 0410  LATICACIDVEN 3.05* 4.86* 1.6  PROCALCITON  --   --  0.44     ENDOCRINE CBG (last 3)   Recent Labs  04/18/15 0749  GLUCAP 140*     Ct Abdomen Pelvis W Contrast  04/17/2015  CLINICAL DATA:  Vomiting blood, tachycardia, altered mental status, possible viscus perforation EXAM: CT ABDOMEN AND PELVIS WITH CONTRAST TECHNIQUE: Multidetector CT imaging of the abdomen and pelvis was performed using the standard protocol  following bolus administration of intravenous contrast. CONTRAST:  168mL OMNIPAQUE IOHEXOL 300 MG/ML  SOLN COMPARISON:  01/26/2013 FINDINGS: Lower chest: Interstitial change at both lung bases possibly representing pulmonary edema. There is motion artifact. Pneumonia/pneumonitis not excluded. Hepatobiliary: There is some gallbladder wall calcification as well as sludge similar to prior study Pancreas: Normal Spleen: Normal Adrenals/Urinary Tract: Adrenal glands are normal. A few small left renal cysts are present. There is a Foley catheter decompressing the bladder. Stomach/Bowel: Stomach is distended with air-fluid level. There is air in the wall of the stomach and there is pneumoperitoneum around the stomach. Fluid refluxes into the distended distal esophagus. No significant abnormalities of small or large bowel, which are relatively decompressed when compared to the stomach. Vascular/Lymphatic: No acute findings Reproductive: No acute abnormalities Other: Air extends through the esophageal hiatus into the lower mediastinum, although a greater volume of free air is seen below the diaphragm. Musculoskeletal: Limited evaluation due to suboptimal positioning. Evidence of prior deformed left femur fracture. 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 Evidence of perforated viscus with pneumoperitoneum left upper quadrant.Pneumatosis can be seen as evidence of wall necrosis related to ischemia or emphysematous gastritis; however, the gastric wall does not appear thickened currently and the possibility that pneumatotics may be of mechanical origin is considered. Bilateral lower lobe infiltrates suggesting edema or possibly pneumonia/pneumonitis Critical Value/emergent results were called by telephone at the time of interpretation on 04/17/2015 at 9:41 pm to Dr. Noemi Chapel , who verbally acknowledged these results. Electronically Signed   By: Skipper Cliche M.D.   On: 04/17/2015 21:41    Dg Chest Port 1 View  04/17/2015  CLINICAL DATA:  Upper GI bleed, hematemesis EXAM: PORTABLE CHEST 1 VIEW COMPARISON:  03/18/2015 FINDINGS: Limited inspiratory effect. Uncoiling of the aorta. Normal heart size. Moderate diffuse interstitial change stable from prior study. There is gas in the left upper quadrant which is only partially visualized, which may be entirely within the stomach. Possibility of left pneumoperitoneum not excluded. IMPRESSION: 1. Chronic interstitial lung disease 2. CT of the abdomen and pelvis recommended in light of clinical information. Pneumoperitoneum not excluded in the left upper abdomen. Electronically Signed   By: Skipper Cliche M.D.   On: 04/17/2015 18:15       DISCUSSION: 59 yom with MS presents to ED on 11/29 with n/v, found to have free air on CT--> Mechanical pneumatosis vs gastric ileus vs. Outlet obstruction. No role for surgery. Lactate of 5 with tachycardia; aggressive IVF resus with 5L of NS and maintenance IVF- lactic acid cleared. Pt with continued tachycardia, no hypotension. HD stable. Pt remains full DNR. PCCM to sign off.   ASSESSMENT / PLAN:  PULMONARY A: Bilateral lower lobe infiltrates--> ? Edema/pneumonia   P:   [place] O2 as needed  Continuous pulse ox  F/u CXR   CARDIOVASCULAR A:  Tachycardia in the setting of sepsis  P:  Tele  IVF @ 75 ml/hr  RENAL A:   Hypophosphatemia  Lactic Acidosis >Resolved Hypokalemia >Resolved  P:   IVF as above  Replete Phos   GASTROINTESTINAL A:   Concern for gastric outlet obstruction with suspected gastric wall ischemia and possible perforation P:   EGD planned for today  Per General Surgery  PPI  NG tube pulled 11/29--> no need to replace unless nausea/vomiting per surg  Conservative line of care according to the patient's wishes    HEMATOLOGIC A:   Anemia of chronic illness  P:  F/u CBC  Transfuse for Hgb  <7   INFECTIOUS A:   Sepsis syndrome secondary to gastric ischemia,  UTI, or sacral decub?  P:   BCx2 11/29 >> UC 11/29 >>   Vancomycin 11/29>> Zosyn 11/29>>   ENDOCRINE A:   No acute  P:   Per [facility] hospitalist  NEUROLOGIC A:   Advanced multiple sclerosis on interferon beta 1A treatment P:   Might need neurology consult - ? Or GI side effects related to interferon alfa treatment Continue B1 per [facility]    FAMILY  - Updates: Reported DO NOT RESUSCITATE. Hospitalist to reconfirm this with the [place]

## 2015-04-19 ENCOUNTER — Inpatient Hospital Stay (HOSPITAL_COMMUNITY): Payer: Medicaid Other

## 2015-04-19 LAB — COMPREHENSIVE METABOLIC PANEL
ALBUMIN: 2.7 g/dL — AB (ref 3.5–5.0)
ALK PHOS: 60 U/L (ref 38–126)
ALT: 35 U/L (ref 17–63)
AST: 23 U/L (ref 15–41)
Anion gap: 4 — ABNORMAL LOW (ref 5–15)
BUN: 10 mg/dL (ref 6–20)
CHLORIDE: 111 mmol/L (ref 101–111)
CO2: 25 mmol/L (ref 22–32)
CREATININE: 0.74 mg/dL (ref 0.61–1.24)
Calcium: 7.6 mg/dL — ABNORMAL LOW (ref 8.9–10.3)
GFR calc non Af Amer: >60 mL/min (ref >60)
GLUCOSE: 87 mg/dL (ref 65–99)
Potassium: 3.6 mmol/L (ref 3.5–5.1)
SODIUM: 140 mmol/L (ref 135–145)
Total Bilirubin: 1.1 mg/dL (ref 0.3–1.2)
Total Protein: 5.7 g/dL — ABNORMAL LOW (ref 6.5–8.1)

## 2015-04-19 LAB — CBC
HCT: 31.2 % — ABNORMAL LOW (ref 39.0–52.0)
Hemoglobin: 10 g/dL — ABNORMAL LOW (ref 13.0–17.0)
MCH: 25.6 pg — ABNORMAL LOW (ref 26.0–34.0)
MCHC: 32.1 g/dL (ref 30.0–36.0)
MCV: 80 fL (ref 78.0–100.0)
Platelets: 185 10*3/uL (ref 150–400)
RBC: 3.9 MIL/uL — ABNORMAL LOW (ref 4.22–5.81)
RDW: 16.3 % — AB (ref 11.5–15.5)
WBC: 6.5 10*3/uL (ref 4.0–10.5)

## 2015-04-19 LAB — PHOSPHORUS: PHOSPHORUS: 2.2 mg/dL — AB (ref 2.5–4.6)

## 2015-04-19 LAB — MAGNESIUM: MAGNESIUM: 2.2 mg/dL (ref 1.7–2.4)

## 2015-04-19 LAB — VANCOMYCIN, TROUGH: Vancomycin Tr: 26 ug/mL (ref 10.0–20.0)

## 2015-04-19 LAB — GLUCOSE, CAPILLARY: GLUCOSE-CAPILLARY: 75 mg/dL (ref 65–99)

## 2015-04-19 MED ORDER — DEXTROSE-NACL 5-0.9 % IV SOLN
INTRAVENOUS | Status: DC
Start: 2015-04-19 — End: 2015-04-25
  Administered 2015-04-19 – 2015-04-23 (×6): via INTRAVENOUS
  Administered 2015-04-24: 1000 mL via INTRAVENOUS
  Administered 2015-04-25: 02:00:00 via INTRAVENOUS

## 2015-04-19 NOTE — Progress Notes (Signed)
PROGRESS NOTE  [name] Mccoy. Bleeker [id number] DOB: 10/06/1960 DOA: 04/17/2015 PCP: Alvester Chou, NP  HPI/Recap of past 24 hours:  Remain npo, hypoglycemia episodes this am,  Overall patient feeling better, denies pain, no n/v. More alert  Assessment/Plan: Principal Problem:   Nausea & vomiting Active Problems:   Hypokalemia   Sepsis ([place])   Decubitus ulcer of buttock   Spasm of muscle   Protein-calorie malnutrition, severe (HCC)   UTI (lower urinary tract infection)   Lactic acidosis   Perforated viscus  Nausea & vomiting with possible coffee ground materials: CT-abd/pelvis findings are concerning for gastric outlet obstruction with suspected gastric wall ischemia and possible perforation but no classic peritoneal signs. General surgeon was consulted. Per Dr. Hassell Done, pt dose not need surgery due to absence of peritoneal signs. [facility] was consulted due to possible hematemesis. Recommended iv protonix, egd likely Monday per GI, keep npo.  -was admitted to SDU initially - NG tube inserted and was pulled out by patient later -keep npo, currently on d5/ns to prevent hypoglycemia -12/1 repeat kub gastric distension resolved.  Sepsis: patient has severe sepsis with leukocytosis, fever, tachycardia, tachypnea and elevated lactate on admission Source of infection UTI? and gastritis/possible perforation of bowel. PCCM was consulted. -IV vancomycin plus Zosyn per Dr. [name]. -Aggressive IVF: 4L of NS bolus and then 100 cc/h -improving, culture unrevealing. Critical care signed off.  Hypokalemia: K= 2.7 on admission. -  2 gram of Magnesium sulfate was also given - stable Mg level, k repleated.  UTI:  -on IV vancomycin and Zosyn -blood culture and urine culture unrevealing  Decubitus ulcer of buttock: -consultation wound care team  Protein-calorie malnutrition, severe ([place]): -Ensure when able to eat  MS: At baseline he needs help with all his activities of daily living  including bathing, toileting, feeding. He is on interferon-beta 1a treatment (plegdirdy) by Dr. [name] since August 2016 [prior GI intolerance with betasteron IFn-beta1b and tecfidera). - Continue home dantrolene, tizanidine, gabapentin and IFN-alpha -f/u with neurologist   DVT ppx: SCD  Code Status: DNR  Family Communication: patient   Disposition Plan: remain inpatient   Consultants:  GI  General surgery  Critical care  Procedures:  Ng insertion, patient pulled it out later  Antibiotics:  vanc/zosyn   Objective: BP 108/55 mmHg  Pulse 72  Temp(Src) 98.2 F (36.8 C) (Oral)  Resp 18  Ht 6' 0.05" (1.83 m)  Wt 197 lb 1.5 oz (89.4 kg)  BMI 26.70 kg/m2  SpO2 100%  Intake/Output Summary (Last 24 hours) at 04/19/15 0757 Last data filed at 04/19/15 0534  Gross per 24 hour  Intake 1644.58 ml  Output   1530 ml  Net 114.58 ml   Filed Weights   04/17/15 1709 04/18/15 0400  Weight: 206 lb (93.441 kg) 197 lb 1.5 oz (89.4 kg)    Exam:   General:  Chronic ill appearing  Cardiovascular: RRR  Respiratory: CTABL  Abdomen: Soft/ND/NT, positive BS  Musculoskeletal: No Edema, chronic contracture bilateral lower extremity  Neuro: chronic paraplegia, contracture, indwelling foley  Skin; chronic decubitus ulcer, clean wound base. Dressing intact.  Data Reviewed: Basic Metabolic Panel:  Recent Labs Lab 04/17/15 1748 04/18/15 0410 04/19/15 0530  NA 145 142 140  K 2.7* 4.0 3.6  CL 116* 114* 111  CO2 19* 21* 25  GLUCOSE 119* 149* 87  BUN 33* 26* 10  CREATININE 0.75 0.92 0.74  CALCIUM 6.3* 7.1* 7.6*  MG  --  2.4 2.2  PHOS  --  1.9* 2.2*   Liver Function Tests:  Recent Labs Lab 04/17/15 1748 04/18/15 0410 04/19/15 0530  AST 42* 53* 23  ALT 42 59 35  ALKPHOS 60 67 60  BILITOT 0.9 1.6* 1.1  PROT 5.8* 6.4* 5.7*  ALBUMIN 2.6* 2.9* 2.7*    Recent Labs Lab 04/17/15 1748  LIPASE 18   No results for input(s): AMMONIA in the last 168  hours. CBC:  Recent Labs Lab 04/17/15 1748 04/18/15 0410 04/19/15 0530  WBC 13.4* 14.9* 6.5  NEUTROABS 10.9*  --   --   HGB 12.4* 11.5* 10.0*  HCT 38.8* 36.4* 31.2*  MCV 78.9 78.3 80.0  PLT 234 229 185   Cardiac Enzymes:    Recent Labs Lab 04/17/15 1748  CKTOTAL 24*   BNP (last 3 results) No results for input(s): BNP in the last 8760 hours.  ProBNP (last 3 results) No results for input(s): PROBNP in the last 8760 hours.  CBG:  Recent Labs Lab 04/18/15 0749 04/19/15 0748  GLUCAP 140* 75    Recent Results (from the past 240 hour(s))  Urine culture     Status: None   Collection Time: 04/17/15  5:34 PM  Result Value Ref Range Status   Specimen Description URINE, CATHETERIZED  Final   Special Requests NONE  Final   Culture   Final    MULTIPLE SPECIES PRESENT, SUGGEST RECOLLECTION Performed at [facility]    Report Status 04/18/2015 FINAL  Final  Blood Culture (routine x 2)     Status: None (Preliminary result)   Collection Time: 04/17/15  6:27 PM  Result Value Ref Range Status   Specimen Description BLOOD RIGHT ARM  Final   Special Requests IN PEDIATRIC BOTTLE 3ML  Final   Culture   Final    NO GROWTH < 24 HOURS Performed at [facility]    Report Status PENDING  Incomplete  MRSA PCR Screening     Status: Abnormal   Collection Time: 04/18/15  1:48 AM  Result Value Ref Range Status   MRSA by PCR POSITIVE (A) NEGATIVE Final    Comment:        The GeneXpert MRSA Assay (FDA approved for NASAL specimens only), is one component of a comprehensive MRSA colonization surveillance program. It is not intended to diagnose MRSA infection nor to guide or monitor treatment for MRSA infections. RESULT CALLED TO, READ BACK BY AND VERIFIED WITH: Merceda Elks RN AT  [phone] ON 11.30.16 BY SHUEA      Studies: Dg Abd Portable 1v  04/18/2015  CLINICAL DATA:  Gastric distension EXAM: PORTABLE ABDOMEN - 1 VIEW COMPARISON:  May 26, 2013 FINDINGS: The  bowel gas pattern is normal. There is moderate gastric distention of the stomach. No radio-opaque calculi or other significant radiographic abnormality are seen. IMPRESSION: No bowel obstruction.  Moderate gastric distention of the stomach. Electronically Signed   By: Abelardo Diesel M.D.   On: 04/18/2015 15:41    Scheduled Meds: . amantadine  100 mg Oral BID  . Chlorhexidine Gluconate Cloth  6 each Topical Q0600  . dantrolene  25 mg Oral TID  . gluconic acid-citric acid  30 mL Irrigation Q3 days  . loratadine  10 mg Oral Daily  . montelukast  10 mg Oral q morning - 10a  . multivitamin with minerals  1 tablet Oral Daily  . mupirocin ointment  1 application Nasal BID  . nystatin  5 mL Oral QID  . pantoprazole (PROTONIX) IV  40  mg Intravenous Q24H  . [START ON 04/25/2015] Peginterferon Beta-1a  125 mcg Subcutaneous Q14 Days  . piperacillin-tazobactam (ZOSYN)  IV  3.375 g Intravenous 3 times per day  . sodium chloride  500 mL Intravenous Once  . sodium chloride  3 mL Intravenous Q12H  . tiZANidine  4 mg Oral QID  . triamcinolone   Topical BID  . vancomycin  1,000 mg Intravenous Q8H    Continuous Infusions: . dextrose 5 % and 0.9% NaCl       Time spent: 57mins  Json Koelzer MD, PhD  [facility] Pager [phone]. If 7PM-7AM, please contact night-coverage at [email or web address], password [facility] 04/19/2015, 7:57 AM  LOS: 1 day

## 2015-04-19 NOTE — Progress Notes (Signed)
[facility] Progress Note  Subjective: No complaints of abdominal pain. No further nausea or vomiting.  Objective: Vital signs in last 24 hours: Temp:  [97.7 F (36.5 C)-98.2 F (36.8 C)] 98.2 F (36.8 C) (12/01 1419) Pulse Rate:  [72-85] 84 (12/01 1419) Resp:  [18-20] 20 (12/01 1419) BP: (88-119)/(55-70) 119/70 mmHg (12/01 1419) SpO2:  [99 %-100 %] 99 % (12/01 1419) Weight change:    PE:  In no acute distress  Heart regular rhythm  Lungs clear  Abdomen soft and nontender with normal bowel sounds  Lab Results: Results for orders placed or performed during the hospital encounter of 04/17/15 (from the past 24 hour(s))  Magnesium     Status: None   Collection Time: 04/19/15  5:30 AM  Result Value Ref Range   Magnesium 2.2 1.7 - 2.4 mg/dL  Phosphorus     Status: Abnormal   Collection Time: 04/19/15  5:30 AM  Result Value Ref Range   Phosphorus 2.2 (L) 2.5 - 4.6 mg/dL  CBC     Status: Abnormal   Collection Time: 04/19/15  5:30 AM  Result Value Ref Range   WBC 6.5 4.0 - 10.5 K/uL   RBC 3.90 (L) 4.22 - 5.81 MIL/uL   Hemoglobin 10.0 (L) 13.0 - 17.0 g/dL   HCT 31.2 (L) 39.0 - 52.0 %   MCV 80.0 78.0 - 100.0 fL   MCH 25.6 (L) 26.0 - 34.0 pg   MCHC 32.1 30.0 - 36.0 g/dL   RDW 16.3 (H) 11.5 - 15.5 %   Platelets 185 150 - 400 K/uL  Comprehensive metabolic panel     Status: Abnormal   Collection Time: 04/19/15  5:30 AM  Result Value Ref Range   Sodium 140 135 - 145 mmol/L   Potassium 3.6 3.5 - 5.1 mmol/L   Chloride 111 101 - 111 mmol/L   CO2 25 22 - 32 mmol/L   Glucose, Bld 87 65 - 99 mg/dL   BUN 10 6 - 20 mg/dL   Creatinine, Ser 0.74 0.61 - 1.24 mg/dL   Calcium 7.6 (L) 8.9 - 10.3 mg/dL   Total Protein 5.7 (L) 6.5 - 8.1 g/dL   Albumin 2.7 (L) 3.5 - 5.0 g/dL   AST 23 15 - 41 U/L   ALT 35 17 - 63 U/L   Alkaline Phosphatase 60 38 - 126 U/L   Total Bilirubin 1.1 0.3 - 1.2 mg/dL   GFR calc non Af Amer >60 >60 mL/min   GFR calc Af Amer >60 >60 mL/min   Anion  gap 4 (L) 5 - 15  Vancomycin, trough     Status: Abnormal   Collection Time: 04/19/15  5:30 AM  Result Value Ref Range   Vancomycin Tr 26 (HH) 10.0 - 20.0 ug/mL  Glucose, capillary     Status: None   Collection Time: 04/19/15  7:48 AM  Result Value Ref Range   Glucose-Capillary 75 65 - 99 mg/dL    Studies/Results: Dg Abd Portable 1v  04/19/2015  CLINICAL DATA:  Patient with history of gastric distension. Followup evaluation. EXAM: PORTABLE ABDOMEN - 1 VIEW COMPARISON:  CT abdomen pelvis 04/17/2015; radiograph abdomen 04/18/2015. FINDINGS: Interval decompression of the stomach. Small amount of cystic lucencies are still demonstrated within the left upper quadrant, most compatible with pneumatosis involving the gastric wall. Nonobstructed bowel gas pattern. Oral contrast material within descending colon. IMPRESSION: Small amount of residual pneumatosis involving the gastric wall. No evidence for bowel obstruction. Electronically Signed   By: Dian Situ  Rosana Hoes M.D.   On: 04/19/2015 08:36      Assessment: Gastric distention with gastric pneumatosis, possible gastric perforation  Plan:   Agree with need for endoscopy but would like to wait a few more days to make sure if there is a sealed off perforation we do not make it worse with air insufflation. We will probably plan for EGD on Monday.    Cassell Clement 04/19/2015, 5:27 PM  Pager: [phone] If no answer or after 5 PM call [phone]

## 2015-04-19 NOTE — Progress Notes (Signed)
Notified  Pharmacy about pt critical Vanc level of 26. Order to stop bag that was infusing. Gave 44cc. Pharmacist notified. And bag stopped.

## 2015-04-19 NOTE — Progress Notes (Signed)
Patient ID: [name] Mccoy, male   DOB: 26-Dec-1960, 54 y.o.   MRN: [id number]    Subjective: Pt feels fine this morning.  No abdominal pain.  No nausea since being admitted  Objective: Vital signs in last 24 hours: Temp:  [97.7 F (36.5 C)-98.8 F (37.1 C)] 98.2 F (36.8 C) (12/01 0529) Pulse Rate:  [72-126] 72 (12/01 0529) Resp:  [13-21] 18 (12/01 0529) BP: (88-108)/(52-81) 108/55 mmHg (12/01 0529) SpO2:  [94 %-100 %] 100 % (12/01 0529) Last BM Date: 04/17/15  Intake/Output from previous day: 11/30 0701 - 12/01 0700 In: 1644.6 [I.V.:884.6; IV Piggyback:760] Out: 1530 [Urine:1530] Intake/Output this shift:    PE: Abd: soft, NT, ND, +BS Heart: regular  Lab Results:   Recent Labs  04/18/15 0410 04/19/15 0530  WBC 14.9* 6.5  HGB 11.5* 10.0*  HCT 36.4* 31.2*  PLT 229 185   BMET  Recent Labs  04/18/15 0410 04/19/15 0530  NA 142 140  K 4.0 3.6  CL 114* 111  CO2 21* 25  GLUCOSE 149* 87  BUN 26* 10  CREATININE 0.92 0.74  CALCIUM 7.1* 7.6*   PT/INR  Recent Labs  04/18/15 0410  LABPROT 15.6*  INR 1.22   CMP     Component Value Date/Time   NA 140 04/19/2015 0530   K 3.6 04/19/2015 0530   CL 111 04/19/2015 0530   CO2 25 04/19/2015 0530   GLUCOSE 87 04/19/2015 0530   BUN 10 04/19/2015 0530   CREATININE 0.74 04/19/2015 0530   CALCIUM 7.6* 04/19/2015 0530   PROT 5.7* 04/19/2015 0530   ALBUMIN 2.7* 04/19/2015 0530   AST 23 04/19/2015 0530   ALT 35 04/19/2015 0530   ALKPHOS 60 04/19/2015 0530   BILITOT 1.1 04/19/2015 0530   GFRNONAA >60 04/19/2015 0530   GFRAA >60 04/19/2015 0530   Lipase     Component Value Date/Time   LIPASE 18 04/17/2015 1748       Studies/Results: Ct Abdomen Pelvis W Contrast  04/17/2015  CLINICAL DATA:  Vomiting blood, tachycardia, altered mental status, possible viscus perforation EXAM: CT ABDOMEN AND PELVIS WITH CONTRAST TECHNIQUE: Multidetector CT imaging of the abdomen and pelvis was performed using the standard  protocol following bolus administration of intravenous contrast. CONTRAST:  174mL OMNIPAQUE IOHEXOL 300 MG/ML  SOLN COMPARISON:  01/26/2013 FINDINGS: Lower chest: Interstitial change at both lung bases possibly representing pulmonary edema. There is motion artifact. Pneumonia/pneumonitis not excluded. Hepatobiliary: There is some gallbladder wall calcification as well as sludge similar to prior study Pancreas: Normal Spleen: Normal Adrenals/Urinary Tract: Adrenal glands are normal. A few small left renal cysts are present. There is a Foley catheter decompressing the bladder. Stomach/Bowel: Stomach is distended with air-fluid level. There is air in the wall of the stomach and there is pneumoperitoneum around the stomach. Fluid refluxes into the distended distal esophagus. No significant abnormalities of small or large bowel, which are relatively decompressed when compared to the stomach. Vascular/Lymphatic: No acute findings Reproductive: No acute abnormalities Other: Air extends through the esophageal hiatus into the lower mediastinum, although a greater volume of free air is seen below the diaphragm. Musculoskeletal: Limited evaluation due to suboptimal positioning. Evidence of prior deformed left femur fracture. 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 Evidence of perforated viscus with pneumoperitoneum left upper quadrant.Pneumatosis can be seen as evidence of wall necrosis related to ischemia or emphysematous gastritis; however, the gastric wall does not appear thickened currently and the possibility that pneumatotics may be  of mechanical origin is considered. Bilateral lower lobe infiltrates suggesting edema or possibly pneumonia/pneumonitis Critical Value/emergent results were called by telephone at the time of interpretation on 04/17/2015 at 9:41 pm to Dr. Noemi Chapel , who verbally acknowledged these results. Electronically Signed   By: Skipper Cliche M.D.   On: 04/17/2015  21:41   Dg Chest Port 1 View  04/17/2015  CLINICAL DATA:  Upper GI bleed, hematemesis EXAM: PORTABLE CHEST 1 VIEW COMPARISON:  03/18/2015 FINDINGS: Limited inspiratory effect. Uncoiling of the aorta. Normal heart size. Moderate diffuse interstitial change stable from prior study. There is gas in the left upper quadrant which is only partially visualized, which may be entirely within the stomach. Possibility of left pneumoperitoneum not excluded. IMPRESSION: 1. Chronic interstitial lung disease 2. CT of the abdomen and pelvis recommended in light of clinical information. Pneumoperitoneum not excluded in the left upper abdomen. Electronically Signed   By: Skipper Cliche M.D.   On: 04/17/2015 18:15   Dg Abd Portable 1v  04/18/2015  CLINICAL DATA:  Gastric distension EXAM: PORTABLE ABDOMEN - 1 VIEW COMPARISON:  May 26, 2013 FINDINGS: The bowel gas pattern is normal. There is moderate gastric distention of the stomach. No radio-opaque calculi or other significant radiographic abnormality are seen. IMPRESSION: No bowel obstruction.  Moderate gastric distention of the stomach. Electronically Signed   By: Abelardo Diesel M.D.   On: 04/18/2015 15:41    Anti-infectives: Anti-infectives    Start     Dose/Rate Route Frequency Ordered Stop   04/18/15 0600  vancomycin (VANCOCIN) IVPB 1000 mg/200 mL premix     1,000 mg 200 mL/hr over 60 Minutes Intravenous Every 8 hours 04/18/15 0102     04/18/15 0030  piperacillin-tazobactam (ZOSYN) IVPB 3.375 g     3.375 g 12.5 mL/hr over 240 Minutes Intravenous 3 times per day 04/18/15 0022     04/17/15 2145  vancomycin (VANCOCIN) IVPB 1000 mg/200 mL premix     1,000 mg 200 mL/hr over 60 Minutes Intravenous  Once 04/17/15 2138 04/17/15 2256   04/17/15 1845  cefTRIAXone (ROCEPHIN) 1 g in dextrose 5 % 50 mL IVPB     1 g 100 mL/hr over 30 Minutes Intravenous  Once 04/17/15 1834 04/17/15 2053       Assessment/Plan  1. Gastric pneumatosis and distention -abdominal  films show improvement of gastric distention, will hold off on NGT insertion and keep NPO until he can be scoped -will need EGD at some point this admit to determine if he has an obstructing lesion that caused this problem. -cont bowel rest and abx therapy   LOS: 1 day    Larie Mathes E 04/19/2015, 8:29 AM Pager: [phone]

## 2015-04-19 NOTE — Progress Notes (Signed)
Pharmacy Antibiotic Time-Out Note  [name] Mccoy is a 54 y.o. year-old male admitted on 04/17/2015.  The patient is currently on vancomycin and Zosyn for Septic shock - IAI, UTI, r/o PNA.  Assessment/Plan:  Continue Zosyn extended infusion as ordered  Hold vancomycin with high trough; will recheck level tomorrow AM  If PNA ruled out, would consider stopping vancomycin, as not part of empiric coverage for IAI and UTI.  Antimicrobial allergies: none  Antimicrobials this admission: 11/29 >> Ceftriaxone x1 11/29 >> Vanc >> 11/29 >> Zosyn >>   Levels/dose changes this admission: 12/1 0530 VT = 26 on 1g q8h prior to 5th dose, got ~250 mg from bag before stopping  Microbiology Results: 11/29 BCx x2: NGTD 11/29 UCx: multiple species, suggest recollect  11/30 MRSA PCR: Positive  Today, 04/19/2015: Tm now afebrile WBC improved to wnl SCr slightly increased, CrCl > 100 ml/min but expect that immobility may lead to falsely elevated CrCl. Sats stable wnl on 2L O2  Temp (24hrs), Avg:98.1 F (36.7 C), Min:97.7 F (36.5 C), Max:98.8 F (37.1 C)   Recent Labs Lab 04/17/15 1748 04/18/15 0410 04/19/15 0530  WBC 13.4* 14.9* 6.5    Recent Labs Lab 04/17/15 1748 04/18/15 0410 04/19/15 0530  CREATININE 0.75 0.92 0.74   Estimated Creatinine Clearance: 118.7 mL/min (by C-G formula based on Cr of 0.74).      Thank you for allowing pharmacy to be a part of this patient's care.  Maurisa Tesmer A PharmD 04/19/2015 9:46 AM

## 2015-04-20 ENCOUNTER — Inpatient Hospital Stay (HOSPITAL_COMMUNITY): Payer: Medicaid Other

## 2015-04-20 LAB — CBC
HCT: 32.5 % — ABNORMAL LOW (ref 39.0–52.0)
HEMOGLOBIN: 10.4 g/dL — AB (ref 13.0–17.0)
MCH: 25.6 pg — ABNORMAL LOW (ref 26.0–34.0)
MCHC: 32 g/dL (ref 30.0–36.0)
MCV: 80 fL (ref 78.0–100.0)
Platelets: 224 10*3/uL (ref 150–400)
RBC: 4.06 MIL/uL — AB (ref 4.22–5.81)
RDW: 16 % — ABNORMAL HIGH (ref 11.5–15.5)
WBC: 7.2 10*3/uL (ref 4.0–10.5)

## 2015-04-20 LAB — PHOSPHORUS: PHOSPHORUS: 2.4 mg/dL — AB (ref 2.5–4.6)

## 2015-04-20 LAB — MAGNESIUM: MAGNESIUM: 2.1 mg/dL (ref 1.7–2.4)

## 2015-04-20 LAB — GLUCOSE, CAPILLARY
GLUCOSE-CAPILLARY: 65 mg/dL (ref 65–99)
GLUCOSE-CAPILLARY: 81 mg/dL (ref 65–99)

## 2015-04-20 LAB — VANCOMYCIN, RANDOM: VANCOMYCIN RM: 8 ug/mL

## 2015-04-20 MED ORDER — VANCOMYCIN HCL IN DEXTROSE 1-5 GM/200ML-% IV SOLN
1000.0000 mg | INTRAVENOUS | Status: DC
  Administered 2015-04-20 – 2015-04-21 (×2): 1000 mg via INTRAVENOUS
  Filled 2015-04-20 (×2): qty 200

## 2015-04-20 MED ORDER — DEXTROSE 50 % IV SOLN
25.0000 mL | Freq: Once | INTRAVENOUS | Status: AC
  Administered 2015-04-20: 25 mL via INTRAVENOUS

## 2015-04-20 MED ORDER — LIP MEDEX EX OINT
TOPICAL_OINTMENT | CUTANEOUS | Status: DC | PRN
  Filled 2015-04-20: qty 7

## 2015-04-20 MED ORDER — DEXTROSE 50 % IV SOLN
INTRAVENOUS | Status: AC
  Filled 2015-04-20: qty 50

## 2015-04-20 NOTE — Care Management Note (Signed)
Case Management Note  Patient Details  Name: [name] Mccoy MRN: [id number] Date of Birth: Oct 02, 1960  Subjective/Objective:                 54 yo admitted with Nausea and vomiting. Hx of MS   Action/Plan: Pt lives alone and plans to go back home. He states that he has help whenever he needs it and feels safe returning home. Pt states that he does not need any additional equipment at home.  Expected Discharge Date:   (unknown)               Expected Discharge Plan:  [place]  In-House Referral:     Discharge planning Services  CM Consult  Post Acute Care Choice:  Home Health Choice offered to:  Patient  DME Arranged:    DME Agency:     HH Arranged:  RN, Nurse's Aide, Social Work [employer] Agency:  [place]  Status of Service:  In process, will continue to follow  Medicare Important Message Given:    Date Medicare IM Given:    Medicare IM give by:    Date Additional Medicare IM Given:    Additional Medicare Important Message give by:     If discussed at [place] of Stay Meetings, dates discussed:    Additional Comments: Pt offered choice for [facility] services and chose [facility]. Will need MD orders for HHRN/Aide/CSW at discharge. Referral made to [facility]. No other DC needs communicated  Lynnell Catalan, RN 04/20/2015, 2:40 PM

## 2015-04-20 NOTE — Clinical Social Work Note (Signed)
Clinical Social Work Assessment  Patient Details  Name: [name] Mccoy. [place] MRN: [id number] Date of Birth: Sep 08, 1960  Date of referral:  04/18/15               Reason for consult:  Other (Comment Required) (Safety-pt lives alone)                Permission sought to share information with:  Other Biochemist, clinical) Permission granted to share information::  Yes, Verbal Permission Granted  Name::        Agency::     Relationship::     Contact Information:     Housing/Transportation Living arrangements for the past 2 months:  Single Family Home Source of Information:  Patient Patient Interpreter Needed:  None Criminal Activity/Legal Involvement Pertinent to Current Situation/Hospitalization:  No - Comment as needed Significant Relationships:  Friend, Parents Lives with:  Self Do you feel safe going back to the place where you live?  Yes Need for family participation in patient care:  Yes (Comment) (Pt stated he has friends that help him daily)  Care giving concerns:  Pt lives alone.  Pt's lawyer/trustee expressed concern for safety in the home due to pt living alone.   Social Worker assessment / plan:  CSW met with pt alone.  Pt is a 54 y/o male who lives alone.  He is bed bound but stated he has friends that come daily to assist him.  Pt has extended family members that live in [place].  CSW inquired as to whether pt has needs in the home.  Pt stated all of his needs are met through his friends.  Pt stated he is not currently receiving any medical services in the home.  CSW explained that lawyer expressed concern for pt's safety.  Pt is alert, oriented and stated he makes his own decisions.  CSW inquired as to where pt has ever considered moving to a facility that can provide assistance.  Pt stated there has not been a formal discussion about moving.  Pt feels that he is safe to return home.  CSW explained that she respected his decision.  CSW also explained that RNCM would stop by to discuss  medical resources that pt may be eligible for.    Employment status:  Disabled (Comment on whether or not currently receiving Disability) Insurance information:  Medicaid In [place] PT Recommendations:  Not assessed at this time Information / Referral to community resources:     Patient/Family's Response to care:  Pt was pleasant and appreciative of CSW assistance.  Patient/Family's Understanding of and Emotional Response to Diagnosis, Current Treatment, and Prognosis:  Pt acknowledged that others may be concerned about him living alone but he feels safe returning home with the support he has in place.  Emotional Assessment Appearance:  Appears stated age Attitude/Demeanor/Rapport:  Other (Cooperative) Affect (typically observed):  Appropriate, Calm, Pleasant Orientation:  Oriented to Self, Oriented to Place, Oriented to  Time, Oriented to Situation Alcohol / Substance use:  Not Applicable Psych involvement (Current and /or in the community):  No (Comment)  Discharge Needs  Concerns to be addressed:  Home Safety Concerns Readmission within the last 30 days:  No Current discharge risk:  Lives alone Barriers to Discharge:  Continued Medical Work up   Pyatt, Gilboa D, LCSW 04/20/2015, 10:15 AM

## 2015-04-20 NOTE — Progress Notes (Signed)
CBG 65 this am.  Will give dextrose 74ml and recheck CBG. Pt asymptomatic.

## 2015-04-20 NOTE — Progress Notes (Signed)
Pharmacy Antibiotic Time-Out Note  [name] Mccoy. Gowda is a 54 y.o. year-old male admitted on 04/17/2015.  The patient is currently on vancomycin and Zosyn for Septic shock - IAI, UTI, r/o PNA.  Assessment/Plan:  Continue Zosyn extended infusion as ordered  Restart vancomycin at dose of 1g q24  If PNA ruled out, would consider stopping vancomycin, as not part of empiric coverage for IAI and UTI.  Antimicrobial allergies: none  Antimicrobials this admission: 11/29 >> Ceftriaxone x1 11/29 >> Vanc >> 11/29 >> Zosyn >>   Levels/dose changes this admission: 12/1 0530 VT = 26 on 1g q8h prior to 5th dose, got ~250 mg from bag before stopping 12/2 0300 Vanc Random = 8 with last full dose of vancomycin 1g given 11/30 at 2246  Microbiology Results: 11/29 BCx x2: NGTD 11/29 UCx: multiple species, suggest recollect  11/30 MRSA PCR: Positive  Today, 04/20/2015: Tm now afebrile WBC improved to wnl SCr slightly increased, CrCl > 100 ml/min but expect that immobility may lead to falsely elevated CrCl. Sats stable wnl on 2L O2  Temp (24hrs), Avg:98.1 F (36.7 C), Min:97.9 F (36.6 C), Max:98.2 F (36.8 C)   Recent Labs Lab 04/17/15 1748 04/18/15 0410 04/19/15 0530 04/20/15 0310  WBC 13.4* 14.9* 6.5 7.2     Recent Labs Lab 04/17/15 1748 04/18/15 0410 04/19/15 0530  CREATININE 0.75 0.92 0.74   Estimated Creatinine Clearance: 118.7 mL/min (by C-G formula based on Cr of 0.74).      Thank you for allowing pharmacy to be a part of this patient's care.   Adrian Saran, PharmD, BCPS Pager [phone] 04/20/2015 8:06 AM

## 2015-04-20 NOTE — Progress Notes (Signed)
Patient ID: [name] Mccoy. [name] Mccoy, male   DOB: Jun 09, 1960, 54 y.o.   MRN: [id number]    Subjective: Pt feels well.  No pain or nausea  Objective: Vital signs in last 24 hours: Temp:  [97.9 F (36.6 C)-98.2 F (36.8 C)] 97.9 F (36.6 C) (12/02 0540) Pulse Rate:  [75-90] 90 (12/02 0540) Resp:  [18-20] 20 (12/02 0540) BP: (108-119)/(65-70) 114/67 mmHg (12/02 0540) SpO2:  [97 %-100 %] 97 % (12/02 0540) Last BM Date: 04/19/15  Intake/Output from previous day: 12/01 0701 - 12/02 0700 In: 300 [P.O.:300] Out: 3050 [Urine:3050] Intake/Output this shift:    PE: Abd: soft, NT, not distended, +BS  Lab Results:   Recent Labs  04/19/15 0530 04/20/15 0310  WBC 6.5 7.2  HGB 10.0* 10.4*  HCT 31.2* 32.5*  PLT 185 224   BMET  Recent Labs  04/18/15 0410 04/19/15 0530  NA 142 140  K 4.0 3.6  CL 114* 111  CO2 21* 25  GLUCOSE 149* 87  BUN 26* 10  CREATININE 0.92 0.74  CALCIUM 7.1* 7.6*   PT/INR  Recent Labs  04/18/15 0410  LABPROT 15.6*  INR 1.22   CMP     Component Value Date/Time   NA 140 04/19/2015 0530   K 3.6 04/19/2015 0530   CL 111 04/19/2015 0530   CO2 25 04/19/2015 0530   GLUCOSE 87 04/19/2015 0530   BUN 10 04/19/2015 0530   CREATININE 0.74 04/19/2015 0530   CALCIUM 7.6* 04/19/2015 0530   PROT 5.7* 04/19/2015 0530   ALBUMIN 2.7* 04/19/2015 0530   AST 23 04/19/2015 0530   ALT 35 04/19/2015 0530   ALKPHOS 60 04/19/2015 0530   BILITOT 1.1 04/19/2015 0530   GFRNONAA >60 04/19/2015 0530   GFRAA >60 04/19/2015 0530   Lipase     Component Value Date/Time   LIPASE 18 04/17/2015 1748       Studies/Results: Dg Abd Portable 1v  04/19/2015  CLINICAL DATA:  Patient with history of gastric distension. Followup evaluation. EXAM: PORTABLE ABDOMEN - 1 VIEW COMPARISON:  CT abdomen pelvis 04/17/2015; radiograph abdomen 04/18/2015. FINDINGS: Interval decompression of the stomach. Small amount of cystic lucencies are still demonstrated within the left upper quadrant,  most compatible with pneumatosis involving the gastric wall. Nonobstructed bowel gas pattern. Oral contrast material within descending colon. IMPRESSION: Small amount of residual pneumatosis involving the gastric wall. No evidence for bowel obstruction. Electronically Signed   By: Lovey Newcomer M.D.   On: 04/19/2015 08:36   Dg Abd Portable 1v  04/18/2015  CLINICAL DATA:  Gastric distension EXAM: PORTABLE ABDOMEN - 1 VIEW COMPARISON:  May 26, 2013 FINDINGS: The bowel gas pattern is normal. There is moderate gastric distention of the stomach. No radio-opaque calculi or other significant radiographic abnormality are seen. IMPRESSION: No bowel obstruction.  Moderate gastric distention of the stomach. Electronically Signed   By: Abelardo Diesel M.D.   On: 04/18/2015 15:41    Anti-infectives: Anti-infectives    Start     Dose/Rate Route Frequency Ordered Stop   04/20/15 1000  vancomycin (VANCOCIN) IVPB 1000 mg/200 mL premix     1,000 mg 200 mL/hr over 60 Minutes Intravenous Every 24 hours 04/20/15 0811     04/18/15 0600  vancomycin (VANCOCIN) IVPB 1000 mg/200 mL premix  Status:  Discontinued     1,000 mg 200 mL/hr over 60 Minutes Intravenous Every 8 hours 04/18/15 0102 04/19/15 1007   04/18/15 0030  piperacillin-tazobactam (ZOSYN) IVPB 3.375 g  3.375 g 12.5 mL/hr over 240 Minutes Intravenous 3 times per day 04/18/15 0022     04/17/15 2145  vancomycin (VANCOCIN) IVPB 1000 mg/200 mL premix     1,000 mg 200 mL/hr over 60 Minutes Intravenous  Once 04/17/15 2138 04/17/15 2256   04/17/15 1845  cefTRIAXone (ROCEPHIN) 1 g in dextrose 5 % 50 mL IVPB     1 g 100 mL/hr over 30 Minutes Intravenous  Once 04/17/15 1834 04/17/15 2053       Assessment/Plan  1. Gastric pneumatosis and distention -repeat film today to ensure no further worsening distention -otherwise cont NPO throughout the weekend and plan for endoscopy on Monday to evaluate for obstructive lesion or other cause of current problem. -cont  to follow   LOS: 2 days    Naly Schwanz E 04/20/2015, 8:55 AM Pager: [phone]

## 2015-04-20 NOTE — Progress Notes (Signed)
PROGRESS NOTE  [name] Mccoy. Peller [id number] DOB: 11/27/1960 DOA: 04/17/2015 PCP: Alvester Chou, NP  HPI/Recap of past 24 hours:  Remain npo,  Overall patient feeling better, denies pain, no n/v. fully alert now, clean urine in foley  Assessment/Plan: Principal Problem:   Nausea & vomiting Active Problems:   Hypokalemia   Sepsis ([place])   Decubitus ulcer of buttock   Spasm of muscle   Protein-calorie malnutrition, severe (HCC)   UTI (lower urinary tract infection)   Lactic acidosis   Perforated viscus  Nausea & vomiting with possible coffee ground materials: CT-abd/pelvis findings are concerning for gastric outlet obstruction with suspected gastric wall ischemia and possible perforation but no classic peritoneal signs. General surgeon was consulted. Per Dr. Hassell Done, pt dose not need surgery due to absence of peritoneal signs. [facility] was consulted due to possible hematemesis. Recommended iv protonix, egd likely Monday per GI, keep npo.  -was admitted to SDU initially - NG tube inserted and was pulled out by patient later -keep npo, currently on d5/ns to prevent hypoglycemia -12/1 repeat kub gastric distension resolved. -keep NPO, EGD on monday  Sepsis: patient has severe sepsis with leukocytosis, fever, tachycardia, tachypnea and elevated lactate on admission Source of infection UTI? and gastritis/possible perforation of bowel. PCCM was consulted. -IV vancomycin plus Zosyn per Dr. [name]. -Aggressive IVF: 4L of NS bolus and then 100 cc/h -improving, culture unrevealing. Critical care signed off.  Hypokalemia: K= 2.7 on admission. -  2 gram of Magnesium sulfate was also given - stable Mg level, k repleated.  UTI:  -on IV vancomycin and Zosyn -blood culture and urine culture unrevealing  Decubitus ulcer of buttock: -wound base clean, does not appear infected -consultation wound care team  Protein-calorie malnutrition, severe ([place]): -Ensure when able to eat  MS: At  baseline he needs help with all his activities of daily living including bathing, toileting, feeding. He is on interferon-beta 1a treatment (plegdirdy) by Dr. [name] since August 2016 [prior GI intolerance with betasteron IFn-beta1b and tecfidera). - Continue home dantrolene, tizanidine, gabapentin and IFN-alpha -f/u with neurologist   DVT ppx: SCD  Code Status: DNR  Family Communication: patient   Disposition Plan: remain inpatient   Consultants:  GI  General surgery  Critical care, signed off  Procedures:  Ng insertion, patient pulled it out later  Antibiotics:  Vanc/zosyn from admission   Objective: BP 122/66 mmHg  Pulse 98  Temp(Src) 98.2 F (36.8 C) (Oral)  Resp 18  Ht 6' 0.05" (1.83 m)  Wt 197 lb 1.5 oz (89.4 kg)  BMI 26.70 kg/m2  SpO2 99%  Intake/Output Summary (Last 24 hours) at 04/20/15 1913 Last data filed at 04/20/15 1500  Gross per 24 hour  Intake    300 ml  Output   3600 ml  Net  -3300 ml   Filed Weights   04/17/15 1709 04/18/15 0400  Weight: 206 lb (93.441 kg) 197 lb 1.5 oz (89.4 kg)    Exam:   General:  Chronic ill appearing, NAD  Cardiovascular: RRR  Respiratory: CTABL  Abdomen: Soft/ND/NT, positive BS  Musculoskeletal: No Edema, chronic contracture bilateral lower extremity  Neuro: chronic paraplegia, contracture, indwelling foley  Skin; chronic decubitus ulcer, clean wound base. Dressing intact.  Data Reviewed: Basic Metabolic Panel:  Recent Labs Lab 04/17/15 1748 04/18/15 0410 04/19/15 0530 04/20/15 0310  NA 145 142 140  --   K 2.7* 4.0 3.6  --   CL 116* 114* 111  --   CO2 19*  21* 25  --   GLUCOSE 119* 149* 87  --   BUN 33* 26* 10  --   CREATININE 0.75 0.92 0.74  --   CALCIUM 6.3* 7.1* 7.6*  --   MG  --  2.4 2.2 2.1  PHOS  --  1.9* 2.2* 2.4*   Liver Function Tests:  Recent Labs Lab 04/17/15 1748 04/18/15 0410 04/19/15 0530  AST 42* 53* 23  ALT 42 59 35  ALKPHOS 60 67 60  BILITOT 0.9 1.6* 1.1   PROT 5.8* 6.4* 5.7*  ALBUMIN 2.6* 2.9* 2.7*    Recent Labs Lab 04/17/15 1748  LIPASE 18   No results for input(s): AMMONIA in the last 168 hours. CBC:  Recent Labs Lab 04/17/15 1748 04/18/15 0410 04/19/15 0530 04/20/15 0310  WBC 13.4* 14.9* 6.5 7.2  NEUTROABS 10.9*  --   --   --   HGB 12.4* 11.5* 10.0* 10.4*  HCT 38.8* 36.4* 31.2* 32.5*  MCV 78.9 78.3 80.0 80.0  PLT 234 229 185 224   Cardiac Enzymes:    Recent Labs Lab 04/17/15 1748  CKTOTAL 24*   BNP (last 3 results) No results for input(s): BNP in the last 8760 hours.  ProBNP (last 3 results) No results for input(s): PROBNP in the last 8760 hours.  CBG:  Recent Labs Lab 04/18/15 0749 04/19/15 0748 04/20/15 0940 04/20/15 1457  GLUCAP 140* 75 65 81    Recent Results (from the past 240 hour(s))  Urine culture     Status: None   Collection Time: 04/17/15  5:34 PM  Result Value Ref Range Status   Specimen Description URINE, CATHETERIZED  Final   Special Requests NONE  Final   Culture   Final    MULTIPLE SPECIES PRESENT, SUGGEST RECOLLECTION Performed at [facility]    Report Status 04/18/2015 FINAL  Final  Blood Culture (routine x 2)     Status: None (Preliminary result)   Collection Time: 04/17/15  6:27 PM  Result Value Ref Range Status   Specimen Description BLOOD RIGHT ARM  Final   Special Requests IN PEDIATRIC BOTTLE 3ML  Final   Culture   Final    NO GROWTH 3 DAYS Performed at [facility]    Report Status PENDING  Incomplete  MRSA PCR Screening     Status: Abnormal   Collection Time: 04/18/15  1:48 AM  Result Value Ref Range Status   MRSA by PCR POSITIVE (A) NEGATIVE Final    Comment:        The GeneXpert MRSA Assay (FDA approved for NASAL specimens only), is one component of a comprehensive MRSA colonization surveillance program. It is not intended to diagnose MRSA infection nor to guide or monitor treatment for MRSA infections. RESULT CALLED TO, READ BACK BY AND  VERIFIED WITH: Merceda Elks RN AT  [phone] ON 11.30.16 BY SHUEA   Blood Culture (routine x 2)     Status: None (Preliminary result)   Collection Time: 04/18/15  4:10 AM  Result Value Ref Range Status   Specimen Description BLOOD RIGHT ANTECUBITAL  Final   Special Requests BOTTLES DRAWN AEROBIC AND ANAEROBIC 5ML  Final   Culture   Final    NO GROWTH 2 DAYS Performed at [facility]    Report Status PENDING  Incomplete     Studies: Dg Abd Portable 1v  04/20/2015  CLINICAL DATA:  Gastric distension EXAM: PORTABLE ABDOMEN - 1 VIEW COMPARISON:  Yesterday FINDINGS: Some pneumatosis still seen  over the upper stomach, without evidence of gastric distention. Small and large bowel has normal caliber. Calcifications over the midline abdomen and left flank are in the anterior abdominal wall by CT. IMPRESSION: Less gastric distention than yesterday. No gross progression of gastric pneumatosis. Electronically Signed   By: Monte Fantasia M.D.   On: 04/20/2015 10:01    Scheduled Meds: . amantadine  100 mg Oral BID  . Chlorhexidine Gluconate Cloth  6 each Topical Q0600  . dantrolene  25 mg Oral TID  . gluconic acid-citric acid  30 mL Irrigation Q3 days  . loratadine  10 mg Oral Daily  . montelukast  10 mg Oral q morning - 10a  . multivitamin with minerals  1 tablet Oral Daily  . mupirocin ointment  1 application Nasal BID  . nystatin  5 mL Oral QID  . pantoprazole (PROTONIX) IV  40 mg Intravenous Q24H  . [START ON 04/25/2015] Peginterferon Beta-1a  125 mcg Subcutaneous Q14 Days  . piperacillin-tazobactam (ZOSYN)  IV  3.375 g Intravenous 3 times per day  . sodium chloride  500 mL Intravenous Once  . sodium chloride  3 mL Intravenous Q12H  . tiZANidine  4 mg Oral QID  . triamcinolone   Topical BID  . vancomycin  1,000 mg Intravenous Q24H    Continuous Infusions: . dextrose 5 % and 0.9% NaCl 75 mL/hr at 04/19/15 0830     Time spent: 91mins  Aneisa Karren MD, PhD  [facility] Pager  [phone]. If 7PM-7AM, please contact night-coverage at [email or web address], password [facility] 04/20/2015, 7:13 PM  LOS: 2 days

## 2015-04-21 LAB — CBC
HEMATOCRIT: 35.8 % — AB (ref 39.0–52.0)
Hemoglobin: 11.4 g/dL — ABNORMAL LOW (ref 13.0–17.0)
MCH: 25.3 pg — ABNORMAL LOW (ref 26.0–34.0)
MCHC: 31.8 g/dL (ref 30.0–36.0)
MCV: 79.6 fL (ref 78.0–100.0)
Platelets: 317 10*3/uL (ref 150–400)
RBC: 4.5 MIL/uL (ref 4.22–5.81)
RDW: 16 % — ABNORMAL HIGH (ref 11.5–15.5)
WBC: 6.8 10*3/uL (ref 4.0–10.5)

## 2015-04-21 LAB — PHOSPHORUS: Phosphorus: 2.7 mg/dL (ref 2.5–4.6)

## 2015-04-21 LAB — GLUCOSE, CAPILLARY: GLUCOSE-CAPILLARY: 101 mg/dL — AB (ref 65–99)

## 2015-04-21 LAB — BASIC METABOLIC PANEL
Anion gap: 10 (ref 5–15)
BUN: 5 mg/dL — ABNORMAL LOW (ref 6–20)
CALCIUM: 8.3 mg/dL — AB (ref 8.9–10.3)
CHLORIDE: 107 mmol/L (ref 101–111)
CO2: 22 mmol/L (ref 22–32)
CREATININE: 0.78 mg/dL (ref 0.61–1.24)
Glucose, Bld: 102 mg/dL — ABNORMAL HIGH (ref 65–99)
Potassium: 3.4 mmol/L — ABNORMAL LOW (ref 3.5–5.1)
Sodium: 139 mmol/L (ref 135–145)

## 2015-04-21 LAB — MAGNESIUM: Magnesium: 2.1 mg/dL (ref 1.7–2.4)

## 2015-04-21 MED ORDER — POTASSIUM CHLORIDE CRYS ER 20 MEQ PO TBCR
40.0000 meq | EXTENDED_RELEASE_TABLET | Freq: Once | ORAL | Status: AC
  Administered 2015-04-21: 40 meq via ORAL
  Filled 2015-04-21: qty 2

## 2015-04-21 NOTE — Progress Notes (Signed)
Patient ID: [name] Mccoy. [name] Mccoy, male   DOB: 05/24/1960, 54 y.o.   MRN: [id number]    Subjective: Without C/O, denies Pain or nausea  Objective: Vital signs in last 24 hours: Temp:  [98.2 F (36.8 C)-98.8 F (37.1 C)] 98.8 F (37.1 C) (12/03 0510) Pulse Rate:  [98-118] 118 (12/03 0510) Resp:  [18-20] 20 (12/03 0510) BP: (122-134)/(66-74) 134/74 mmHg (12/03 0510) SpO2:  [94 %-99 %] 94 % (12/03 0510) Last BM Date: 04/20/15  Intake/Output from previous day: 12/02 0701 - 12/03 0700 In: -  Out: 3050 [Urine:3050] Intake/Output this shift:    General appearance: alert, cooperative and no distress GI: normal findings: soft, non-tender and non distended  Lab Results:   Recent Labs  04/20/15 0310 04/21/15 0431  WBC 7.2 6.8  HGB 10.4* 11.4*  HCT 32.5* 35.8*  PLT 224 317   BMET  Recent Labs  04/19/15 0530 04/21/15 0431  NA 140 139  K 3.6 3.4*  CL 111 107  CO2 25 22  GLUCOSE 87 102*  BUN 10 5*  CREATININE 0.74 0.78  CALCIUM 7.6* 8.3*     Studies/Results: Dg Abd Portable 1v  04/20/2015  CLINICAL DATA:  Gastric distension EXAM: PORTABLE ABDOMEN - 1 VIEW COMPARISON:  Yesterday FINDINGS: Some pneumatosis still seen over the upper stomach, without evidence of gastric distention. Small and large bowel has normal caliber. Calcifications over the midline abdomen and left flank are in the anterior abdominal wall by CT. IMPRESSION: Less gastric distention than yesterday. No gross progression of gastric pneumatosis. Electronically Signed   By: Monte Fantasia M.D.   On: 04/20/2015 10:01   Dg Abd Portable 1v  04/19/2015  CLINICAL DATA:  Patient with history of gastric distension. Followup evaluation. EXAM: PORTABLE ABDOMEN - 1 VIEW COMPARISON:  CT abdomen pelvis 04/17/2015; radiograph abdomen 04/18/2015. FINDINGS: Interval decompression of the stomach. Small amount of cystic lucencies are still demonstrated within the left upper quadrant, most compatible with pneumatosis involving the  gastric wall. Nonobstructed bowel gas pattern. Oral contrast material within descending colon. IMPRESSION: Small amount of residual pneumatosis involving the gastric wall. No evidence for bowel obstruction. Electronically Signed   By: Lovey Newcomer M.D.   On: 04/19/2015 08:36    Anti-infectives: Anti-infectives    Start     Dose/Rate Route Frequency Ordered Stop   04/20/15 1000  vancomycin (VANCOCIN) IVPB 1000 mg/200 mL premix     1,000 mg 200 mL/hr over 60 Minutes Intravenous Every 24 hours 04/20/15 0811     04/18/15 0600  vancomycin (VANCOCIN) IVPB 1000 mg/200 mL premix  Status:  Discontinued     1,000 mg 200 mL/hr over 60 Minutes Intravenous Every 8 hours 04/18/15 0102 04/19/15 1007   04/18/15 0030  piperacillin-tazobactam (ZOSYN) IVPB 3.375 g     3.375 g 12.5 mL/hr over 240 Minutes Intravenous 3 times per day 04/18/15 0022     04/17/15 2145  vancomycin (VANCOCIN) IVPB 1000 mg/200 mL premix     1,000 mg 200 mL/hr over 60 Minutes Intravenous  Once 04/17/15 2138 04/17/15 2256   04/17/15 1845  cefTRIAXone (ROCEPHIN) 1 g in dextrose 5 % 50 mL IVPB     1 g 100 mL/hr over 30 Minutes Intravenous  Once 04/17/15 1834 04/17/15 2053      Assessment/Plan: Acute gastric distention with pneumatosis, Clinically resolved p NG.  NPO EGD Monday Very stable, we will F/U Monday p EGD   LOS: 3 days    Renny Gunnarson T 04/21/2015

## 2015-04-21 NOTE — Progress Notes (Signed)
PROGRESS NOTE  [name] Mccoy [id number] DOB: 1960/06/04 DOA: 04/17/2015 PCP: [name] Chou, NP  HPI/Recap of past 24 hours:  Remain npo, on ivf No new complaints, no fever, clean urine in foley  Assessment/Plan: Principal Problem:   Nausea & vomiting Active Problems:   Hypokalemia   Sepsis ([place])   Decubitus ulcer of buttock   Spasm of muscle   Protein-calorie malnutrition, severe (HCC)   UTI (lower urinary tract infection)   Lactic acidosis   Perforated viscus  Nausea & vomiting with possible coffee ground materials: CT-abd/pelvis findings are concerning for gastric outlet obstruction with suspected gastric wall ischemia and possible perforation but no classic peritoneal signs. General surgeon was consulted. Per Dr. Hassell Done, pt dose not need surgery due to absence of peritoneal signs. [facility] was consulted due to possible hematemesis. Recommended iv protonix, egd likely Monday per GI, keep npo.  -was admitted to SDU initially, now stable - NG tube inserted and was pulled out by patient later - repeat kub improved. -keep NPO, continue ivf,. Gi/surgery following. EGD on monday  Sepsis:  presented with severe sepsis with leukocytosis, fever, tachycardia, tachypnea and elevated lactate on admission Source of infection UTI? and gastritis/possible perforation of bowel. -IV vancomycin plus Zosyn per critical care Dr. [name]. -improving, culture unrevealing. Critical care signed off. Will narrowing abx, d/v vanc on 12/3, continue zosyn for gi flora coverage.  Hypokalemia: K= 2.7 on admission. -  2 gram of Magnesium sulfate was also given - stable Mg level, k repleated.  UTI: with indwelling foley, foley changed by urology on 12/1 -was on IV vancomycin and Zosyn -blood culture and urine culture unrevealing vanc d/ed on 12/3, continued on zosyn.  Decubitus ulcer of buttock: -wound base clean, does not appear infected -consultation wound care team  Protein-calorie  malnutrition, severe ([place]): -Ensure when able to eat  MS: At baseline he needs help with all his activities of daily living including bathing, toileting, feeding. He is on interferon-beta 1a treatment (plegdirdy) by Dr. [name] since August 2016 [prior GI intolerance with betasteron IFn-beta1b and tecfidera). - Continue home dantrolene, tizanidine, gabapentin and IFN-alpha -f/u with neurologist   DVT ppx: SCD  Code Status: DNR  Family Communication: patient   Disposition Plan: remain inpatient   Consultants:  GI  General surgery  Critical care, signed off  Procedures:  Ng insertion, patient pulled it out later  Antibiotics:  Vanc/zosyn from admission, vanc d/ced on 12/3   Objective: BP 120/72 mmHg  Pulse 101  Temp(Src) 98.8 F (37.1 C) (Oral)  Resp 20  Ht 6' 0.05" (1.83 m)  Wt 197 lb 1.5 oz (89.4 kg)  BMI 26.70 kg/m2  SpO2 96%  Intake/Output Summary (Last 24 hours) at 04/21/15 1748 Last data filed at 04/21/15 1700  Gross per 24 hour  Intake      0 ml  Output   1350 ml  Net  -1350 ml   Filed Weights   04/17/15 1709 04/18/15 0400  Weight: 206 lb (93.441 kg) 197 lb 1.5 oz (89.4 kg)    Exam:   General:  Chronic ill appearing, NAD  Cardiovascular: RRR  Respiratory: CTABL  Abdomen: Soft/ND/NT, positive BS  Musculoskeletal: No Edema, chronic contracture bilateral lower extremity  Neuro: chronic paraplegia, contracture, intact sensation, indwelling foley  Skin; chronic decubitus ulcer, clean wound base. Dressing intact.  Data Reviewed: Basic Metabolic Panel:  Recent Labs Lab 04/17/15 1748 04/18/15 0410 04/19/15 0530 04/20/15 0310 04/21/15 0431  NA 145 142 140  --  139  K 2.7* 4.0 3.6  --  3.4*  CL 116* 114* 111  --  107  CO2 19* 21* 25  --  22  GLUCOSE 119* 149* 87  --  102*  BUN 33* 26* 10  --  5*  CREATININE 0.75 0.92 0.74  --  0.78  CALCIUM 6.3* 7.1* 7.6*  --  8.3*  MG  --  2.4 2.2 2.1 2.1  PHOS  --  1.9* 2.2* 2.4* 2.7    Liver Function Tests:  Recent Labs Lab 04/17/15 1748 04/18/15 0410 04/19/15 0530  AST 42* 53* 23  ALT 42 59 35  ALKPHOS 60 67 60  BILITOT 0.9 1.6* 1.1  PROT 5.8* 6.4* 5.7*  ALBUMIN 2.6* 2.9* 2.7*    Recent Labs Lab 04/17/15 1748  LIPASE 18   No results for input(s): AMMONIA in the last 168 hours. CBC:  Recent Labs Lab 04/17/15 1748 04/18/15 0410 04/19/15 0530 04/20/15 0310 04/21/15 0431  WBC 13.4* 14.9* 6.5 7.2 6.8  NEUTROABS 10.9*  --   --   --   --   HGB 12.4* 11.5* 10.0* 10.4* 11.4*  HCT 38.8* 36.4* 31.2* 32.5* 35.8*  MCV 78.9 78.3 80.0 80.0 79.6  PLT 234 229 185 224 317   Cardiac Enzymes:    Recent Labs Lab 04/17/15 1748  CKTOTAL 24*   BNP (last 3 results) No results for input(s): BNP in the last 8760 hours.  ProBNP (last 3 results) No results for input(s): PROBNP in the last 8760 hours.  CBG:  Recent Labs Lab 04/18/15 0749 04/19/15 0748 04/20/15 0940 04/20/15 1457 04/21/15 0758  GLUCAP 140* 75 65 81 101*    Recent Results (from the past 240 hour(s))  Urine culture     Status: None   Collection Time: 04/17/15  5:34 PM  Result Value Ref Range Status   Specimen Description URINE, CATHETERIZED  Final   Special Requests NONE  Final   Culture   Final    MULTIPLE SPECIES PRESENT, SUGGEST RECOLLECTION Performed at [facility]    Report Status 04/18/2015 FINAL  Final  Blood Culture (routine x 2)     Status: None (Preliminary result)   Collection Time: 04/17/15  6:27 PM  Result Value Ref Range Status   Specimen Description BLOOD RIGHT ARM  Final   Special Requests IN PEDIATRIC BOTTLE 3ML  Final   Culture   Final    NO GROWTH 4 DAYS Performed at [facility]    Report Status PENDING  Incomplete  MRSA PCR Screening     Status: Abnormal   Collection Time: 04/18/15  1:48 AM  Result Value Ref Range Status   MRSA by PCR POSITIVE (A) NEGATIVE Final    Comment:        The GeneXpert MRSA Assay (FDA approved for NASAL  specimens only), is one component of a comprehensive MRSA colonization surveillance program. It is not intended to diagnose MRSA infection nor to guide or monitor treatment for MRSA infections. RESULT CALLED TO, READ BACK BY AND VERIFIED WITH: Merceda Elks RN AT  [phone] ON 11.30.16 BY SHUEA   Blood Culture (routine x 2)     Status: None (Preliminary result)   Collection Time: 04/18/15  4:10 AM  Result Value Ref Range Status   Specimen Description BLOOD RIGHT ANTECUBITAL  Final   Special Requests BOTTLES DRAWN AEROBIC AND ANAEROBIC 5ML  Final   Culture   Final    NO GROWTH 3 DAYS Performed at  [facility]    Report Status PENDING  Incomplete     Studies: No results found.  Scheduled Meds: . amantadine  100 mg Oral BID  . Chlorhexidine Gluconate Cloth  6 each Topical Q0600  . dantrolene  25 mg Oral TID  . gluconic acid-citric acid  30 mL Irrigation Q3 days  . loratadine  10 mg Oral Daily  . montelukast  10 mg Oral q morning - 10a  . multivitamin with minerals  1 tablet Oral Daily  . mupirocin ointment  1 application Nasal BID  . nystatin  5 mL Oral QID  . pantoprazole (PROTONIX) IV  40 mg Intravenous Q24H  . [START ON 04/25/2015] Peginterferon Beta-1a  125 mcg Subcutaneous Q14 Days  . piperacillin-tazobactam (ZOSYN)  IV  3.375 g Intravenous 3 times per day  . sodium chloride  500 mL Intravenous Once  . sodium chloride  3 mL Intravenous Q12H  . tiZANidine  4 mg Oral QID  . triamcinolone   Topical BID  . vancomycin  1,000 mg Intravenous Q24H    Continuous Infusions: . dextrose 5 % and 0.9% NaCl 75 mL/hr at 04/21/15 1059     Time spent: 2mins  Alton Bouknight MD, PhD  [facility] Pager [phone]. If 7PM-7AM, please contact night-coverage at [email or web address], password [facility] 04/21/2015, 5:48 PM  LOS: 3 days

## 2015-04-22 LAB — BASIC METABOLIC PANEL
ANION GAP: 8 (ref 5–15)
BUN: <5 mg/dL — ABNORMAL LOW (ref 6–20)
CALCIUM: 8.4 mg/dL — AB (ref 8.9–10.3)
CHLORIDE: 110 mmol/L (ref 101–111)
CO2: 21 mmol/L — ABNORMAL LOW (ref 22–32)
CREATININE: 0.76 mg/dL (ref 0.61–1.24)
GFR calc non Af Amer: >60 mL/min (ref >60)
Glucose, Bld: 113 mg/dL — ABNORMAL HIGH (ref 65–99)
Potassium: 3.7 mmol/L (ref 3.5–5.1)
SODIUM: 139 mmol/L (ref 135–145)

## 2015-04-22 LAB — CULTURE, BLOOD (ROUTINE X 2): CULTURE: NO GROWTH

## 2015-04-22 LAB — GLUCOSE, CAPILLARY: GLUCOSE-CAPILLARY: 103 mg/dL — AB (ref 65–99)

## 2015-04-22 NOTE — Progress Notes (Signed)
PROGRESS NOTE  [name] Mccoy. Strimple [id number] DOB: August 13, 1960 DOA: 04/17/2015 PCP: Alvester Chou, NP  HPI/Recap of past 24 hours:  Remain npo, on ivf, No new complaints, no n/v, denies ab pain, no fever, clean urine in foley  Assessment/Plan: Principal Problem:   Nausea & vomiting Active Problems:   Hypokalemia   Sepsis ([place])   Decubitus ulcer of buttock   Spasm of muscle   Protein-calorie malnutrition, severe (HCC)   UTI (lower urinary tract infection)   Lactic acidosis   Perforated viscus  Nausea & vomiting with possible coffee ground materials: CT-abd/pelvis findings are concerning for gastric outlet obstruction with suspected gastric wall ischemia and possible perforation but no classic peritoneal signs. General surgeon was consulted. Per Dr. Hassell Done, pt dose not need surgery due to absence of peritoneal signs. [facility] was consulted due to possible hematemesis. Recommended iv protonix, egd likely Monday per GI, keep npo.  -was admitted to SDU initially, now stable - NG tube inserted and was pulled out by patient later - repeat kub improved. -keep NPO, continue ivf,. Gi/surgery following. EGD on monday  Sepsis:  presented with severe sepsis with leukocytosis, fever, tachycardia, tachypnea and elevated lactate on admission Source of infection UTI? and gastritis/possible perforation of bowel. -IV vancomycin plus Zosyn per critical care Dr. [name]. -improving, culture unrevealing. Critical care signed off. Will narrowing abx, d/v vanc on 12/3, continue zosyn for gi flora coverage.  Hypokalemia: K= 2.7 on admission. -  2 gram of Magnesium sulfate was also given - stable Mg level, k repleated.  UTI: with indwelling foley, foley changed by urology on 12/1 -was on IV vancomycin and Zosyn -blood culture and urine culture unrevealing vanc d/ed on 12/3, continued on zosyn.  Decubitus ulcer of buttock: -wound base clean, does not appear infected -consultation wound care  team  Protein-calorie malnutrition, severe ([place]): -Ensure when able to eat  MS: At baseline he needs help with all his activities of daily living including bathing, toileting, feeding. He is on interferon-beta 1a treatment (plegdirdy) by Dr. [name] since August 2016 [prior GI intolerance with betasteron IFn-beta1b and tecfidera). - Continue home dantrolene, tizanidine, gabapentin and IFN-alpha -f/u with neurologist   DVT ppx: SCD  Code Status: DNR  Family Communication: patient   Disposition Plan: remain inpatient   Consultants:  GI  General surgery  Critical care, signed off  Procedures:  Ng insertion, patient pulled it out later  Antibiotics:  Vanc/zosyn from admission, vanc d/ced on 12/3   Objective: BP 120/73 mmHg  Pulse 104  Temp(Src) 98.6 F (37 C) (Oral)  Resp 18  Ht 6' 0.05" (1.83 m)  Wt 197 lb 1.5 oz (89.4 kg)  BMI 26.70 kg/m2  SpO2 94%  Intake/Output Summary (Last 24 hours) at 04/22/15 1855 Last data filed at 04/22/15 1300  Gross per 24 hour  Intake      0 ml  Output   1625 ml  Net  -1625 ml   Filed Weights   04/17/15 1709 04/18/15 0400  Weight: 206 lb (93.441 kg) 197 lb 1.5 oz (89.4 kg)    Exam:   General:  Chronic ill appearing, NAD  Cardiovascular: RRR  Respiratory: CTABL  Abdomen: Soft/ND/NT, positive BS  Musculoskeletal: No Edema, chronic contracture bilateral lower extremity  Neuro: chronic paraplegia, contracture, intact sensation, indwelling foley  Skin; chronic decubitus ulcer, clean wound base. Dressing intact.  Data Reviewed: Basic Metabolic Panel:  Recent Labs Lab 04/17/15 1748 04/18/15 0410 04/19/15 0530 04/20/15 0310 04/21/15 0431 04/22/15 0413  NA 145 142 140  --  139 139  K 2.7* 4.0 3.6  --  3.4* 3.7  CL 116* 114* 111  --  107 110  CO2 19* 21* 25  --  22 21*  GLUCOSE 119* 149* 87  --  102* 113*  BUN 33* 26* 10  --  5* <5*  CREATININE 0.75 0.92 0.74  --  0.78 0.76  CALCIUM 6.3* 7.1* 7.6*  --   8.3* 8.4*  MG  --  2.4 2.2 2.1 2.1  --   PHOS  --  1.9* 2.2* 2.4* 2.7  --    Liver Function Tests:  Recent Labs Lab 04/17/15 1748 04/18/15 0410 04/19/15 0530  AST 42* 53* 23  ALT 42 59 35  ALKPHOS 60 67 60  BILITOT 0.9 1.6* 1.1  PROT 5.8* 6.4* 5.7*  ALBUMIN 2.6* 2.9* 2.7*    Recent Labs Lab 04/17/15 1748  LIPASE 18   No results for input(s): AMMONIA in the last 168 hours. CBC:  Recent Labs Lab 04/17/15 1748 04/18/15 0410 04/19/15 0530 04/20/15 0310 04/21/15 0431  WBC 13.4* 14.9* 6.5 7.2 6.8  NEUTROABS 10.9*  --   --   --   --   HGB 12.4* 11.5* 10.0* 10.4* 11.4*  HCT 38.8* 36.4* 31.2* 32.5* 35.8*  MCV 78.9 78.3 80.0 80.0 79.6  PLT 234 229 185 224 317   Cardiac Enzymes:    Recent Labs Lab 04/17/15 1748  CKTOTAL 24*   BNP (last 3 results) No results for input(s): BNP in the last 8760 hours.  ProBNP (last 3 results) No results for input(s): PROBNP in the last 8760 hours.  CBG:  Recent Labs Lab 04/19/15 0748 04/20/15 0940 04/20/15 1457 04/21/15 0758 04/22/15 0744  GLUCAP 75 65 81 101* 103*    Recent Results (from the past 240 hour(s))  Urine culture     Status: None   Collection Time: 04/17/15  5:34 PM  Result Value Ref Range Status   Specimen Description URINE, CATHETERIZED  Final   Special Requests NONE  Final   Culture   Final    MULTIPLE SPECIES PRESENT, SUGGEST RECOLLECTION Performed at [facility]    Report Status 04/18/2015 FINAL  Final  Blood Culture (routine x 2)     Status: None   Collection Time: 04/17/15  6:27 PM  Result Value Ref Range Status   Specimen Description BLOOD RIGHT ARM  Final   Special Requests IN PEDIATRIC BOTTLE 3ML  Final   Culture   Final    NO GROWTH 5 DAYS Performed at [facility]    Report Status 04/22/2015 FINAL  Final  MRSA PCR Screening     Status: Abnormal   Collection Time: 04/18/15  1:48 AM  Result Value Ref Range Status   MRSA by PCR POSITIVE (A) NEGATIVE Final    Comment:         The GeneXpert MRSA Assay (FDA approved for NASAL specimens only), is one component of a comprehensive MRSA colonization surveillance program. It is not intended to diagnose MRSA infection nor to guide or monitor treatment for MRSA infections. RESULT CALLED TO, READ BACK BY AND VERIFIED WITH: Merceda Elks RN AT  [phone] ON 11.30.16 BY SHUEA   Blood Culture (routine x 2)     Status: None (Preliminary result)   Collection Time: 04/18/15  4:10 AM  Result Value Ref Range Status   Specimen Description BLOOD RIGHT ANTECUBITAL  Final   Special Requests BOTTLES DRAWN AEROBIC  AND ANAEROBIC 5ML  Final   Culture   Final    NO GROWTH 4 DAYS Performed at [facility]    Report Status PENDING  Incomplete     Studies: No results found.  Scheduled Meds: . amantadine  100 mg Oral BID  . Chlorhexidine Gluconate Cloth  6 each Topical Q0600  . dantrolene  25 mg Oral TID  . gluconic acid-citric acid  30 mL Irrigation Q3 days  . loratadine  10 mg Oral Daily  . montelukast  10 mg Oral q morning - 10a  . multivitamin with minerals  1 tablet Oral Daily  . mupirocin ointment  1 application Nasal BID  . nystatin  5 mL Oral QID  . pantoprazole (PROTONIX) IV  40 mg Intravenous Q24H  . [START ON 04/25/2015] Peginterferon Beta-1a  125 mcg Subcutaneous Q14 Days  . piperacillin-tazobactam (ZOSYN)  IV  3.375 g Intravenous 3 times per day  . sodium chloride  500 mL Intravenous Once  . sodium chloride  3 mL Intravenous Q12H  . tiZANidine  4 mg Oral QID  . triamcinolone   Topical BID    Continuous Infusions: . dextrose 5 % and 0.9% NaCl 75 mL/hr at 04/21/15 2138     Time spent: 72mins  Nazirah Tri MD, PhD  [facility] Pager [phone]. If 7PM-7AM, please contact night-coverage at [email or web address], password [facility] 04/22/2015, 6:55 PM  LOS: 4 days

## 2015-04-22 NOTE — Progress Notes (Signed)
Cleda Daub. Glaza 2:12 PM  Subjective: Patient doing well without any complaints and much better than when he came in and his hospital computer chart was reviewed and his case discussed with my partner Dr. Penelope Coop  Objective: Vital signs stable afebrile no acute distress abdomen is soft nontender good bowel sounds yesterday white count normal  Assessment: Abnormal CAT scan  Plan: Okay to proceed with endoscopy with anesthesia assistance tomorrow morning and I explained the risks of the procedure to the patient and my partner Dr. Oletta Lamas will proceed  [facility] E  Pager [phone] After 5PM or if no answer call [phone]

## 2015-04-22 NOTE — Progress Notes (Signed)
Patient refusing wound dressing  to his left hip changed  this evening , it was changed today at 06:30 am.  He states wants it changed once a day. Will keep monitoring.

## 2015-04-23 ENCOUNTER — Inpatient Hospital Stay (HOSPITAL_COMMUNITY): Payer: Medicaid Other | Admitting: Anesthesiology

## 2015-04-23 ENCOUNTER — Encounter (HOSPITAL_COMMUNITY): Payer: Self-pay | Admitting: Certified Registered Nurse Anesthetist

## 2015-04-23 ENCOUNTER — Encounter (HOSPITAL_COMMUNITY)
Admission: EM | Disposition: A | Discharge status: Home Health | Payer: Self-pay | Source: Home / Self Care | Attending: Internal Medicine

## 2015-04-23 HISTORY — PX: ESOPHAGOGASTRODUODENOSCOPY (EGD) WITH PROPOFOL: SHX5813

## 2015-04-23 LAB — BASIC METABOLIC PANEL
Anion gap: 9 (ref 5–15)
CALCIUM: 8.7 mg/dL — AB (ref 8.9–10.3)
CO2: 21 mmol/L — ABNORMAL LOW (ref 22–32)
CREATININE: 0.82 mg/dL (ref 0.61–1.24)
Chloride: 110 mmol/L (ref 101–111)
Glucose, Bld: 106 mg/dL — ABNORMAL HIGH (ref 65–99)
Potassium: 3.5 mmol/L (ref 3.5–5.1)
SODIUM: 140 mmol/L (ref 135–145)

## 2015-04-23 LAB — CULTURE, BLOOD (ROUTINE X 2): Culture: NO GROWTH

## 2015-04-23 LAB — CBC
HCT: 35.9 % — ABNORMAL LOW (ref 39.0–52.0)
Hemoglobin: 11.7 g/dL — ABNORMAL LOW (ref 13.0–17.0)
MCH: 25.9 pg — AB (ref 26.0–34.0)
MCHC: 32.6 g/dL (ref 30.0–36.0)
MCV: 79.6 fL (ref 78.0–100.0)
PLATELETS: 405 10*3/uL — AB (ref 150–400)
RBC: 4.51 MIL/uL (ref 4.22–5.81)
RDW: 16.3 % — AB (ref 11.5–15.5)
WBC: 8.1 10*3/uL (ref 4.0–10.5)

## 2015-04-23 LAB — MAGNESIUM: MAGNESIUM: 1.9 mg/dL (ref 1.7–2.4)

## 2015-04-23 LAB — GLUCOSE, CAPILLARY: Glucose-Capillary: 93 mg/dL (ref 65–99)

## 2015-04-23 SURGERY — ESOPHAGOGASTRODUODENOSCOPY (EGD) WITH PROPOFOL
Anesthesia: Monitor Anesthesia Care

## 2015-04-23 MED ORDER — SODIUM CHLORIDE 0.9 % IV SOLN: INTRAVENOUS | Status: DC

## 2015-04-23 MED ORDER — PROPOFOL 10 MG/ML IV BOLUS
INTRAVENOUS | Status: AC
  Filled 2015-04-23: qty 20

## 2015-04-23 MED ORDER — LACTATED RINGERS IV SOLN
INTRAVENOUS | Status: DC
  Administered 2015-04-23: 1000 mL via INTRAVENOUS

## 2015-04-23 MED ORDER — ESMOLOL HCL 100 MG/10ML IV SOLN
INTRAVENOUS | Status: DC | PRN
Start: 2015-04-23 — End: 2015-04-23
  Administered 2015-04-23: 25 mg via INTRAVENOUS

## 2015-04-23 MED ORDER — ESMOLOL HCL 100 MG/10ML IV SOLN
INTRAVENOUS | Status: AC
  Filled 2015-04-23: qty 10

## 2015-04-23 MED ORDER — PROPOFOL 10 MG/ML IV BOLUS
INTRAVENOUS | Status: DC | PRN
  Administered 2015-04-23: 25 mg via INTRAVENOUS
  Administered 2015-04-23: 50 mg via INTRAVENOUS
  Administered 2015-04-23: 25 mg via INTRAVENOUS
  Administered 2015-04-23: 50 mg via INTRAVENOUS
  Administered 2015-04-23: 25 mg via INTRAVENOUS

## 2015-04-23 MED ORDER — PHENYLEPHRINE HCL 10 MG/ML IJ SOLN
INTRAMUSCULAR | Status: AC
  Filled 2015-04-23: qty 1

## 2015-04-23 SURGICAL SUPPLY — 15 items

## 2015-04-23 NOTE — H&P (View-Only) (Signed)
[facility] CONSULT Reason for consult: CG Emesis  Referring Physician:  ER Perley Jain K. [name] Mccoy is an 54 y.o. male.  HPI: Brief note due to Dragon nonfunctional. Please see ER MD note for details of history. Pt with MS with indwelling foley and chronic UTI. History from pt and caregiver, no prior history of PUD, GI bleed or EGD. CG emesis 2 days ago and today with melenic stool in diaper . No NSAIDS no abdominal pain. Multiple problems related to MS.  Past Medical History  Diagnosis Date  . Multiple sclerosis ([place])   . Pressure ulcer, buttock(707.05)   . Pain in joint, pelvic region and thigh   . Spasm of muscle   . Unspecified vitamin D deficiency   . Disturbance of skin sensation   . Right ureteral stone 01/03/13  . Renal stones   . Neurogenic bladder     01/13/13 [facility] CATH  . Urinary retention with incomplete bladder emptying 01/13/13    [facility] CATHETER  . Neuromuscular disorder ([place])     multiple schlorosis since 1983  . Hyponatremia 01/27/2013  . DNR (do not resuscitate)     yellow copy    Past Surgical History  Procedure Laterality Date  . Wound debridement Right 10/15/2012    Procedure: DEBRIDMENT OF RIGHT HIP DECUBITUS AND SACRAL DECUBITUS;  Surgeon: Earnstine Regal, MD;  Location: [facility];  Service: General;  Laterality: Right;  left lateral position, right hip  . Nephrolithotomy Right 01/25/2013    Procedure: RIGHT PERCUTANEOUS NEPHROLITHOTOMY ;  Surgeon: Malka So, MD;  Location: [facility];  Service: Urology;  Laterality: Right;  . Kidney stone surgery      x 2    Family History  Problem Relation Age of Onset  . Hypertension Mother   . Dementia Mother   . Hypertension Father   . Heart disease Father     Social History:  reports that he has never smoked. He has never used smokeless tobacco. He reports that he does not drink alcohol or use illicit drugs.  Allergies: No Known Allergies  Medications; Prior to Admission medications   Medication Sig  Start Date End Date Taking? Authorizing Provider  amantadine (SYMMETREL) 100 MG capsule Take 100 mg by mouth 2 (two) times daily.    Yes Historical Provider, MD  cetirizine (ZYRTEC) 10 MG tablet  12/12/14  Yes Historical Provider, MD  dantrolene (DANTRIUM) 25 MG capsule Take 25 mg by mouth 3 (three) times daily.   Yes Historical Provider, MD  feeding supplement (ENSURE COMPLETE) LIQD Take 237 mLs by mouth 3 (three) times daily between meals. 09/21/12  Yes Modena Jansky, MD  gabapentin (NEURONTIN) 300 MG capsule TAKE 2 CAPSULES 4 TIMES DAILY. 04/07/14  Yes Philmore Pali, NP  gluconic acid-citric acid (RENACIDIN) irrigation Irrigate with 30 mLs as directed as directed. Every 1-3 days for foley. 03/08/15  Yes Historical Provider, MD  hydrOXYzine (ATARAX/VISTARIL) 25 MG tablet Take 25 mg by mouth every 4 (four) hours as needed for anxiety or itching.  05/16/14  Yes Historical Provider, MD  metoprolol succinate (TOPROL-XL) 25 MG 24 hr tablet Take 25 mg by mouth daily. 01/28/13  Yes Irine Seal, MD  montelukast (SINGULAIR) 10 MG tablet Take 10 mg by mouth every morning.  05/16/14  Yes Historical Provider, MD  Multiple Vitamin (MULTIVITAMIN WITH MINERALS) TABS Take 1 tablet by mouth daily. 09/21/12  Yes Modena Jansky, MD  nystatin (MYCOSTATIN) 100000 UNIT/ML suspension SWISH AND SWALLOW 5 MILLILITERS  FOUR TIMES A DAY FOR 5 DAYS 12/06/14  Yes Historical Provider, MD  ondansetron (ZOFRAN) 4 MG tablet take 1 tablet by mouth three times a day AS NEEDED FOR NAUSEA. 12/16/14  Yes Historical Provider, MD  oxybutynin (DITROPAN) 5 MG tablet Take 5 mg by mouth 3 (three) times daily.   Yes Historical Provider, MD  oxyCODONE-acetaminophen (PERCOCET) 10-325 MG per tablet Take 1 tablet by mouth every 6 (six) hours as needed for pain.  06/07/14  Yes Historical Provider, MD  Peginterferon Beta-1a (PLEGRIDY) 125 MCG/0.5ML SOPN Inject 125 mcg into the skin every 14 (fourteen) days.   Yes Historical Provider, MD  simvastatin (ZOCOR)  20 MG tablet Take 20 mg by mouth at bedtime.    Yes Historical Provider, MD  tiZANidine (ZANAFLEX) 4 MG tablet Take 4 mg by mouth 4 (four) times daily.    Yes Historical Provider, MD  triamcinolone (KENALOG) 0.025 % cream  12/12/14  Yes Historical Provider, MD    PRN Meds  Results for orders placed or performed during the hospital encounter of 04/17/15 (from the past 48 hour(s))  Urinalysis, Routine w reflex microscopic (not at [facility])     Status: Abnormal   Collection Time: 04/17/15  5:34 PM  Result Value Ref Range   Color, Urine YELLOW YELLOW   APPearance TURBID (A) CLEAR   Specific Gravity, Urine 1.024 1.005 - 1.030   pH 8.0 5.0 - 8.0   Glucose, UA NEGATIVE NEGATIVE mg/dL   Hgb urine dipstick LARGE (A) NEGATIVE   Bilirubin Urine SMALL (A) NEGATIVE   Ketones, ur NEGATIVE NEGATIVE mg/dL   Protein, ur 100 (A) NEGATIVE mg/dL   Nitrite NEGATIVE NEGATIVE   Leukocytes, UA LARGE (A) NEGATIVE  Urine microscopic-add on     Status: Abnormal   Collection Time: 04/17/15  5:34 PM  Result Value Ref Range   Squamous Epithelial / LPF 0-5 (A) NONE SEEN   WBC, UA TOO NUMEROUS TO COUNT 0 - 5 WBC/hpf   RBC / HPF TOO NUMEROUS TO COUNT 0 - 5 RBC/hpf   Bacteria, UA MANY (A) NONE SEEN   Crystals TRIPLE PHOSPHATE CRYSTALS (A) NEGATIVE    Comment: BILIRUBIN CRYSTALS  Comprehensive metabolic panel     Status: Abnormal   Collection Time: 04/17/15  5:48 PM  Result Value Ref Range   Sodium 145 135 - 145 mmol/L   Potassium 2.7 (LL) 3.5 - 5.1 mmol/L    Comment: CRITICAL RESULT CALLED TO, READ BACK BY AND VERIFIED WITH: WEST S RN AT 1855 ON 11.29.16 BY MENDOZA B    Chloride 116 (H) 101 - 111 mmol/L   CO2 19 (L) 22 - 32 mmol/L   Glucose, Bld 119 (H) 65 - 99 mg/dL   BUN 33 (H) 6 - 20 mg/dL   Creatinine, Ser 0.75 0.61 - 1.24 mg/dL   Calcium 6.3 (LL) 8.9 - 10.3 mg/dL    Comment: CRITICAL RESULT CALLED TO, READ BACK BY AND VERIFIED WITH: WEST S RN AT 1855 ON 11.29.16 BY MENDOZA B    Total Protein 5.8 (L)  6.5 - 8.1 g/dL   Albumin 2.6 (L) 3.5 - 5.0 g/dL   AST 42 (H) 15 - 41 U/L   ALT 42 17 - 63 U/L   Alkaline Phosphatase 60 38 - 126 U/L   Total Bilirubin 0.9 0.3 - 1.2 mg/dL   GFR calc non Af Amer >60 >60 mL/min   GFR calc Af Amer >60 >60 mL/min    Comment: (NOTE) The eGFR has  been calculated using the CKD EPI equation. This calculation has not been validated in all clinical situations. eGFR's persistently <60 mL/min signify possible Chronic Kidney Disease.    Anion gap 10 5 - 15  CBC WITH DIFFERENTIAL     Status: Abnormal   Collection Time: 04/17/15  5:48 PM  Result Value Ref Range   WBC 13.4 (H) 4.0 - 10.5 K/uL   RBC 4.92 4.22 - 5.81 MIL/uL   Hemoglobin 12.4 (L) 13.0 - 17.0 g/dL   HCT 38.8 (L) 39.0 - 52.0 %   MCV 78.9 78.0 - 100.0 fL   MCH 25.2 (L) 26.0 - 34.0 pg   MCHC 32.0 30.0 - 36.0 g/dL   RDW 15.7 (H) 11.5 - 15.5 %   Platelets 234 150 - 400 K/uL   Neutrophils Relative % 81 %   Neutro Abs 10.9 (H) 1.7 - 7.7 K/uL   Lymphocytes Relative 10 %   Lymphs Abs 1.4 0.7 - 4.0 K/uL   Monocytes Relative 9 %   Monocytes Absolute 1.2 (H) 0.1 - 1.0 K/uL   Eosinophils Relative 0 %   Eosinophils Absolute 0.0 0.0 - 0.7 K/uL   Basophils Relative 0 %   Basophils Absolute 0.0 0.0 - 0.1 K/uL  Lipase, blood     Status: None   Collection Time: 04/17/15  5:48 PM  Result Value Ref Range   Lipase 18 11 - 51 U/L  CK     Status: Abnormal   Collection Time: 04/17/15  5:48 PM  Result Value Ref Range   Total CK 24 (L) 49 - 397 U/L  I-Stat CG4 Lactic Acid, ED  (not at  [facility])     Status: Abnormal   Collection Time: 04/17/15  5:56 PM  Result Value Ref Range   Lactic Acid, Venous 3.05 (HH) 0.5 - 2.0 mmol/L   Comment NOTIFIED PHYSICIAN   POC occult blood, ED RN will collect     Status: Abnormal   Collection Time: 04/17/15  5:56 PM  Result Value Ref Range   Fecal Occult Bld POSITIVE (A) NEGATIVE  Type and screen     Status: None   Collection Time: 04/17/15  6:25 PM  Result Value Ref Range    ABO/RH(D) A POS    Antibody Screen NEG    Sample Expiration 04/20/2015   I-Stat CG4 Lactic Acid, ED     Status: Abnormal   Collection Time: 04/17/15  8:13 PM  Result Value Ref Range   Lactic Acid, Venous 4.86 (HH) 0.5 - 2.0 mmol/L    Dg Chest Port 1 View  04/17/2015  CLINICAL DATA:  Upper GI bleed, hematemesis EXAM: PORTABLE CHEST 1 VIEW COMPARISON:  03/18/2015 FINDINGS: Limited inspiratory effect. Uncoiling of the aorta. Normal heart size. Moderate diffuse interstitial change stable from prior study. There is gas in the left upper quadrant which is only partially visualized, which may be entirely within the stomach. Possibility of left pneumoperitoneum not excluded. IMPRESSION: 1. Chronic interstitial lung disease 2. CT of the abdomen and pelvis recommended in light of clinical information. Pneumoperitoneum not excluded in the left upper abdomen. Electronically Signed   By: Skipper Cliche M.D.   On: 04/17/2015 18:15               Blood pressure 99/71, pulse 139, temperature 101.4 F (38.6 C), temperature source Rectal, resp. rate 22, weight 93.441 kg (206 lb), SpO2 97 %.  Physical exam:   General--alert male anwers appropriately  ENT--MM moist  Heart--RRR Lungs--clear Abdomen--completely  nontender    Assessment: 1. Hematemesis. ? Ulcer denies NSAIDs  2. UTI 3. MS with multiple related problems.  Plan: Would give IV Protonix empirically  Will proceed with EGD in AM probably by Dr Penelope Coop. Discussed with pt and caregiver. Time to be determined.    Kirke Breach JR,Marsela Kuan L 04/17/2015, 8:56 PM   Pager: [phone] If no answer or after hours call [phone]

## 2015-04-23 NOTE — Anesthesia Postprocedure Evaluation (Signed)
Anesthesia Post Note  Patient: [name] Mccoy. Due  Procedure(s) Performed: Procedure(s) (LRB): ESOPHAGOGASTRODUODENOSCOPY (EGD) WITH PROPOFOL (N/A)  Patient location during evaluation: PACU Anesthesia Type: MAC Level of consciousness: awake and alert Pain management: pain level controlled Vital Signs Assessment: post-procedure vital signs reviewed and stable Respiratory status: spontaneous breathing, nonlabored ventilation, respiratory function stable and patient connected to nasal cannula oxygen Cardiovascular status: blood pressure returned to baseline and stable Postop Assessment: no signs of nausea or vomiting Anesthetic complications: no    Last Vitals:  Filed Vitals:   04/23/15 0940 04/23/15 0950  BP: 105/71 109/70  Pulse: 102 96  Temp:    Resp: 18 18    Last Pain:  Filed Vitals:   04/23/15 0957  PainSc: 0-No pain                 Briasia Flinders JENNETTE

## 2015-04-23 NOTE — Progress Notes (Addendum)
PROGRESS NOTE  [name] Mccoy. [place]ter[place] [id number] DOB: 1960-11-01 DOA: 04/17/2015 PCP: Alvester Chou, NP  HPI/Recap of past 24 hours:  Returned from EGD, Remain npo, on ivf, No new complaints, no n/v, denies ab pain, no fever, clean urine in foley  Assessment/Plan: Principal Problem:   Nausea & vomiting Active Problems:   Hypokalemia   Sepsis ([place])   Decubitus ulcer of buttock   Spasm of muscle   Protein-calorie malnutrition, severe (HCC)   UTI (lower urinary tract infection)   Lactic acidosis   Perforated viscus  Nausea & vomiting with possible coffee ground materials:  was admitted to SDU initially, now stable CT-abd/pelvis on admission concerning for gastric outlet obstruction with suspected gastric wall ischemia and possible perforation but no classic peritoneal signs. General surgeon and [facility] consulted - NG tube inserted and was pulled out by patient later - repeat kub improved. -keep NPO, continue ivf, iv ppi, -EGD 12/5 + severe proximal gastritis. GI recommend repeat kub on 12/6, if stable , car start clears after that.  Sepsis:  presented with severe sepsis with leukocytosis, fever, tachycardia, tachypnea and elevated lactate on admission Source of infection UTI? and gastritis/possible perforation of bowel. -IV vancomycin plus Zosyn per critical care Dr. [name]. -improving, culture unrevealing. Critical care signed off.  d/v vanc on 12/3, continue zosyn for gi flora coverage.  Hypokalemia: K= 2.7 on admission. -  2 gram of Magnesium sulfate was also given - stable Mg level, k repleated.  UTI: with indwelling foley, foley changed by urology on 12/1 -was on IV vancomycin and Zosyn -blood culture and urine culture unrevealing vanc d/ed on 12/3, continued on zosyn.  Decubitus ulcer of buttock: -wound base clean, does not appear infected -consultation wound care team  Protein-calorie malnutrition, severe ([place]): -Ensure when able to eat  MS: At baseline he  needs help with all his activities of daily living including bathing, toileting, feeding. He is on interferon-beta 1a treatment (plegdirdy) by Dr. [name] since August 2016 [prior GI intolerance with betasteron IFn-beta1b and tecfidera). - Continue home dantrolene, tizanidine, gabapentin and IFN-alpha -f/u with neurologist  MRSA colonization: finished decolonization protocol.   DVT ppx: SCD  Code Status: DNR  Family Communication: patient   Disposition Plan: remain inpatient, possible d/c mid week if able to tolerate diet   Consultants:  [facility]  General surgery  Critical care, signed off  Procedures:  Ng insertion, patient pulled it out later  EGD 12/5  Antibiotics:  Vanc from admission, vanc d/ced on 12/3  Zosyn from 11/30   Objective: BP 109/65 mmHg  Pulse 98  Temp(Src) 98.1 F (36.7 C) (Oral)  Resp 20  Ht 6' 0.05" (1.83 m)  Wt 197 lb 1.5 oz (89.4 kg)  BMI 26.70 kg/m2  SpO2 96%  Intake/Output Summary (Last 24 hours) at 04/23/15 1717 Last data filed at 04/23/15 1600  Gross per 24 hour  Intake    300 ml  Output   1700 ml  Net  -1400 ml   Filed Weights   04/17/15 1709 04/18/15 0400  Weight: 206 lb (93.441 kg) 197 lb 1.5 oz (89.4 kg)    Exam:   General:  Chronic ill appearing, NAD  Cardiovascular: RRR  Respiratory: CTABL  Abdomen: Soft/ND/NT, positive BS  Musculoskeletal: No Edema, chronic contracture bilateral lower extremity  Neuro: chronic paraplegia, contracture, intact sensation, indwelling foley  Skin; chronic decubitus ulcer, clean wound base. Dressing intact.  Data Reviewed: Basic Metabolic Panel:  Recent Labs Lab 04/18/15 0410 04/19/15  0530 04/20/15 0310 04/21/15 0431 04/22/15 0413 04/23/15 0416  NA 142 140  --  139 139 140  K 4.0 3.6  --  3.4* 3.7 3.5  CL 114* 111  --  107 110 110  CO2 21* 25  --  22 21* 21*  GLUCOSE 149* 87  --  102* 113* 106*  BUN 26* 10  --  5* <5* <5*  CREATININE 0.92 0.74  --  0.78 0.76  0.82  CALCIUM 7.1* 7.6*  --  8.3* 8.4* 8.7*  MG 2.4 2.2 2.1 2.1  --  1.9  PHOS 1.9* 2.2* 2.4* 2.7  --   --    Liver Function Tests:  Recent Labs Lab 04/17/15 1748 04/18/15 0410 04/19/15 0530  AST 42* 53* 23  ALT 42 59 35  ALKPHOS 60 67 60  BILITOT 0.9 1.6* 1.1  PROT 5.8* 6.4* 5.7*  ALBUMIN 2.6* 2.9* 2.7*    Recent Labs Lab 04/17/15 1748  LIPASE 18   No results for input(s): AMMONIA in the last 168 hours. CBC:  Recent Labs Lab 04/17/15 1748 04/18/15 0410 04/19/15 0530 04/20/15 0310 04/21/15 0431 04/23/15 0416  WBC 13.4* 14.9* 6.5 7.2 6.8 8.1  NEUTROABS 10.9*  --   --   --   --   --   HGB 12.4* 11.5* 10.0* 10.4* 11.4* 11.7*  HCT 38.8* 36.4* 31.2* 32.5* 35.8* 35.9*  MCV 78.9 78.3 80.0 80.0 79.6 79.6  PLT 234 229 185 224 317 405*   Cardiac Enzymes:    Recent Labs Lab 04/17/15 1748  CKTOTAL 24*   BNP (last 3 results) No results for input(s): BNP in the last 8760 hours.  ProBNP (last 3 results) No results for input(s): PROBNP in the last 8760 hours.  CBG:  Recent Labs Lab 04/20/15 0940 04/20/15 1457 04/21/15 0758 04/22/15 0744 04/23/15 0735  GLUCAP 65 81 101* 103* 93    Recent Results (from the past 240 hour(s))  Urine culture     Status: None   Collection Time: 04/17/15  5:34 PM  Result Value Ref Range Status   Specimen Description URINE, CATHETERIZED  Final   Special Requests NONE  Final   Culture   Final    MULTIPLE SPECIES PRESENT, SUGGEST RECOLLECTION Performed at [facility]    Report Status 04/18/2015 FINAL  Final  Blood Culture (routine x 2)     Status: None   Collection Time: 04/17/15  6:27 PM  Result Value Ref Range Status   Specimen Description BLOOD RIGHT ARM  Final   Special Requests IN PEDIATRIC BOTTLE 3ML  Final   Culture   Final    NO GROWTH 5 DAYS Performed at [facility]    Report Status 04/22/2015 FINAL  Final  MRSA PCR Screening     Status: Abnormal   Collection Time: 04/18/15  1:48 AM  Result  Value Ref Range Status   MRSA by PCR POSITIVE (A) NEGATIVE Final    Comment:        The GeneXpert MRSA Assay (FDA approved for NASAL specimens only), is one component of a comprehensive MRSA colonization surveillance program. It is not intended to diagnose MRSA infection nor to guide or monitor treatment for MRSA infections. RESULT CALLED TO, READ BACK BY AND VERIFIED WITH: Merceda Elks RN AT  [phone] ON 11.30.16 BY SHUEA   Blood Culture (routine x 2)     Status: None   Collection Time: 04/18/15  4:10 AM  Result Value Ref Range Status  Specimen Description BLOOD RIGHT ANTECUBITAL  Final   Special Requests BOTTLES DRAWN AEROBIC AND ANAEROBIC 5ML  Final   Culture   Final    NO GROWTH 5 DAYS Performed at [facility]    Report Status 04/23/2015 FINAL  Final     Studies: No results found.  Scheduled Meds: . amantadine  100 mg Oral BID  . dantrolene  25 mg Oral TID  . gluconic acid-citric acid  30 mL Irrigation Q3 days  . loratadine  10 mg Oral Daily  . montelukast  10 mg Oral q morning - 10a  . multivitamin with minerals  1 tablet Oral Daily  . nystatin  5 mL Oral QID  . pantoprazole (PROTONIX) IV  40 mg Intravenous Q24H  . [START ON 04/25/2015] Peginterferon Beta-1a  125 mcg Subcutaneous Q14 Days  . piperacillin-tazobactam (ZOSYN)  IV  3.375 g Intravenous 3 times per day  . sodium chloride  500 mL Intravenous Once  . sodium chloride  3 mL Intravenous Q12H  . tiZANidine  4 mg Oral QID  . triamcinolone   Topical BID    Continuous Infusions: . dextrose 5 % and 0.9% NaCl 75 mL/hr at 04/23/15 1624     Time spent: 58mins  Izabela Ow MD, PhD  [facility] Pager [phone]. If 7PM-7AM, please contact night-coverage at [email or web address], password [facility] 04/23/2015, 5:17 PM  LOS: 5 days

## 2015-04-23 NOTE — Progress Notes (Signed)
Pharmacy Antibiotic Time-Out Note  [name] Mccoy is a 54 y.o. year-old male admitted on 04/17/2015.  The patient is currently on vancomycin and Zosyn for Septic shock - IAI, UTI, r/o PNA.  Assessment/Plan:  Day 7 antibiotics  Continue Zosyn extended infusion as ordered  Suggested LOT for complicated UTI is [id number]; could consider stopping UTI treatment given rapid clinical improvement.    Given lack of perforation, Flagyl is probably sufficient coverage for gastritis/pneumatosis while awaiting EGD biopsy results   Antimicrobial allergies: none  Antimicrobials this admission: 11/29 >> Ceftriaxone x1 11/29 >> Vanc >>12/3 11/29 >> Zosyn >>   Levels/dose changes this admission: 12/1 0530 VT = 26 on 1g q8h prior to 5th dose, got ~250 mg from bag before stopping 12/2 0300 VR = 8 - last full dose of vanc was 1g on 11/30 at 2246  Microbiology Results: 11/29 BCx x2: NGTD 11/29 UCx: multiple species, suggest recollect  11/30 MRSA PCR: Positive  Today, 04/23/2015: Tm now afebrile WBC improved to wnl SCr stable, CrCl > 100 but expect overestimate due to immobility  Temp (24hrs), Avg:97.9 F (36.6 C), Min:97.2 F (36.2 C), Max:98.6 F (37 C)   Recent Labs Lab 04/18/15 0410 04/19/15 0530 04/20/15 0310 04/21/15 0431 04/23/15 0416  WBC 14.9* 6.5 7.2 6.8 8.1     Recent Labs Lab 04/18/15 0410 04/19/15 0530 04/21/15 0431 04/22/15 0413 04/23/15 0416  CREATININE 0.92 0.74 0.78 0.76 0.82   Estimated Creatinine Clearance: 115.8 mL/min (by C-G formula based on Cr of 0.82).     Thank you for allowing pharmacy to be a part of this patient's care.   Reuel Boom, PharmD, BCPS Pager: [phone] 04/23/2015, 10:05 AM

## 2015-04-23 NOTE — Interval H&P Note (Signed)
History and Physical Interval Note:  04/23/2015 8:18 AM  [name] Mccoy  has presented today for surgery, with the diagnosis of abnormal CT  The various methods of treatment have been discussed with the patient and family. After consideration of risks, benefits and other options for treatment, the patient has consented to  Procedure(s): ESOPHAGOGASTRODUODENOSCOPY (EGD) WITH PROPOFOL (N/A) as a surgical intervention .  The patient's history has been reviewed, patient examined, no change in status, stable for surgery.  I have reviewed the patient's chart and labs.  Questions were answered to the patient's satisfaction.     Governor Matos JR,Azani Brogdon L

## 2015-04-23 NOTE — Progress Notes (Signed)
Day of Surgery  Subjective: He is back from EGD, still a bit sleepy.  Not really remembering what happened with EGD.  He has no pain and no real complaints.  i talked with Dr. Erlinda Hong and she is going watch today, check KUB in AM and start clears if OK.    Objective: Vital signs in last 24 hours: Temp:  [97.2 F (36.2 C)-98.6 F (37 C)] 98 F (36.7 C) (12/05 1019) Pulse Rate:  [92-110] 92 (12/05 1019) Resp:  [16-30] 20 (12/05 1019) BP: (97-126)/(63-81) 107/73 mmHg (12/05 1019) SpO2:  [94 %-100 %] 100 % (12/05 1019) Last BM Date: 04/21/15 Stool x 1 Afebrile, VSS Labs OK    Abnormal CT. This shows pneumatosis of the gastric wall and the only abnormal finding really is gastric inflammation. EGD today:  No gross ulcerations were seen. 2. Marked Gastric Inflammation This is located in the proximal stomach near the Cardia and was biopsied. 3. Normal antrum, pyloric channel, and duodenum with no sign of gastric outlet obstruction.    Intake/Output from previous day: 12/04 0701 - 12/05 0700 In: -  Out: 1800 [Urine:1800] Intake/Output this shift: Total I/O In: 300 [I.V.:300] Out: -   General appearance: alert, cooperative and sleepy, no distress. GI: soft, non-tender; bowel sounds normal; no masses,  no organomegaly  Lab Results:   Recent Labs  04/21/15 0431 04/23/15 0416  WBC 6.8 8.1  HGB 11.4* 11.7*  HCT 35.8* 35.9*  PLT 317 405*    BMET  Recent Labs  04/22/15 0413 04/23/15 0416  NA 139 140  K 3.7 3.5  CL 110 110  CO2 21* 21*  GLUCOSE 113* 106*  BUN <5* <5*  CREATININE 0.76 0.82  CALCIUM 8.4* 8.7*   PT/INR No results for input(s): LABPROT, INR in the last 72 hours.   Recent Labs Lab 04/17/15 1748 04/18/15 0410 04/19/15 0530  AST 42* 53* 23  ALT 42 59 35  ALKPHOS 60 67 60  BILITOT 0.9 1.6* 1.1  PROT 5.8* 6.4* 5.7*  ALBUMIN 2.6* 2.9* 2.7*     Lipase     Component Value Date/Time   LIPASE 18 04/17/2015 1748     Studies/Results: No results  found.  Medications: . amantadine  100 mg Oral BID  . dantrolene  25 mg Oral TID  . gluconic acid-citric acid  30 mL Irrigation Q3 days  . loratadine  10 mg Oral Daily  . montelukast  10 mg Oral q morning - 10a  . multivitamin with minerals  1 tablet Oral Daily  . nystatin  5 mL Oral QID  . pantoprazole (PROTONIX) IV  40 mg Intravenous Q24H  . [START ON 04/25/2015] Peginterferon Beta-1a  125 mcg Subcutaneous Q14 Days  . piperacillin-tazobactam (ZOSYN)  IV  3.375 g Intravenous 3 times per day  . sodium chloride  500 mL Intravenous Once  . sodium chloride  3 mL Intravenous Q12H  . tiZANidine  4 mg Oral QID  . triamcinolone   Topical BID    Assessment/Plan Acute gastric distention with pneumatosis Gastric inflammation on EGD, bx pending Nausea and vomiting Multiple sclerosis bed bound  Contractures/muscle spasm Neurogenic bladder  Hx of nephrolithiasis Sacral decubitus debridement 09/2012 PCM   Antibiotics:  6 days of Zosyn completed yesterday DVT:  SCD's  Plan:  Agree with current plans per Dr. Erlinda Hong and Oletta Lamas.  We will continue to follow for now.   LOS: 5 days    [name] Mccoy 04/23/2015

## 2015-04-23 NOTE — Anesthesia Preprocedure Evaluation (Signed)
Anesthesia Evaluation  Patient identified by MRN, date of birth, ID band Patient awake    Reviewed: Allergy & Precautions, NPO status , Patient's Chart, lab work & pertinent test results  History of Anesthesia Complications Negative for: history of anesthetic complications  Airway Mallampati: III  TM Distance: >3 FB Neck ROM: Full    Dental no notable dental hx. (+) Dental Advisory Given   Pulmonary neg pulmonary ROS,    Pulmonary exam normal breath sounds clear to auscultation       Cardiovascular negative cardio ROS Normal cardiovascular exam Rhythm:Regular Rate:Normal     Neuro/Psych Multiple Sclerosis, bedbound since 2013  Neuromuscular disease negative psych ROS   GI/Hepatic negative GI ROS, Neg liver ROS,   Endo/Other  negative endocrine ROS  Renal/GU Renal diseasenegative Renal ROS  negative genitourinary   Musculoskeletal negative musculoskeletal ROS (+)   Abdominal   Peds negative pediatric ROS (+)  Hematology negative hematology ROS (+)   Anesthesia Other Findings   Reproductive/Obstetrics negative OB ROS                             Anesthesia Physical Anesthesia Plan  ASA: III  Anesthesia Plan: MAC   Post-op Pain Management:    Induction: Intravenous  Airway Management Planned:   Additional Equipment:   Intra-op Plan:   Post-operative Plan:   Informed Consent: I have reviewed the patients History and Physical, chart, labs and discussed the procedure including the risks, benefits and alternatives for the proposed anesthesia with the patient or authorized representative who has indicated his/her understanding and acceptance.   Dental advisory given  Plan Discussed with: CRNA  Anesthesia Plan Comments:         Anesthesia Quick Evaluation

## 2015-04-23 NOTE — Transfer of Care (Signed)
Immediate Anesthesia Transfer of Care Note  Patient: [name] Mccoy. B[place]ow  Procedure(s) Performed: Procedure(s): ESOPHAGOGASTRODUODENOSCOPY (EGD) WITH PROPOFOL (N/A)  Patient Location: PACU  Anesthesia Type:MAC  Level of Consciousness: alert , oriented and sedated  Airway & Oxygen Therapy: Patient Spontanous Breathing and Patient connected to nasal cannula oxygen  Post-op Assessment: Report given to RN and Post -op Vital signs reviewed and stable  Post vital signs: Reviewed and stable  Last Vitals:  Filed Vitals:   04/23/15 0555 04/23/15 0827  BP: 117/79 123/81  Pulse: 104 101  Temp: 36.7 C 36.2 C  Resp: 16 21    Complications: No apparent anesthesia complications

## 2015-04-23 NOTE — Op Note (Signed)
[facility] [place], [id number]   ENDOSCOPY PROCEDURE REPORT  PATIENT: [name] Mccoy, [name] Mccoy  MR#: [id number] BIRTHDATE: 11-14-1960 , 54  yrs. old GENDER: male ENDOSCOPIST:Riyaan Heroux Oletta Lamas, MD REFERRED BY: [facility] PROCEDURE DATE:  04/23/2015 PROCEDURE: ASA CLASS:    class IV INDICATIONS: patient presented with positive stools vague upper abdominal pain and CT scanshowing pneumatosis in the gastric wall. He did not have a surgical abdomen and has been treated with NG suction. His symptoms have improved dramatically. Subsequent x-rays have not shown any signs of free air. MEDICATION: propofol180 mg IV TOPICAL ANESTHETIC:   Cetacaine Spray  DESCRIPTION OF PROCEDURE:   After the risks and benefits of the procedure were explained, informed consent was obtained.  The Pentax Gastroscope [id number]  endoscope was introduced through the mouth  and advanced to the second portion of the duodenum .  The instrument was slowly withdrawn as the mucosa was fully examined. Estimated blood loss is zero unless otherwise noted in this procedure report. There somewhat increased liquid in the stomach despite an overnight fast and this was suctioned. The antrum was completely normal as well as the pyloric channel which was passed. Slight inflammationin the duodenum but no ulceration or other significant findings in the 2nd duodenum or the duodenal bulb. The scope was withdrawn back into the body of the stomach.There was marked inflammation in the area of the Cardiathat actually had the appearance of the mass but was soft.This was biopsied extensively. It did distend fairly well with insulation of CO2. This surrounded the Cardia in the greater curve. The GE junction was normalas well as the esophagus which was seen well upon withdrawal. The patient tolerated the procedure well there were no immediate complications.    The scope was then withdrawn from the patient and  the procedure completed.  COMPLICATIONS: There were no immediate complications.  ENDOSCOPIC IMPRESSION: 1. Abnormal CT. This shows pneumatosis of the gastric wall and the only abnormal finding really isgastric inflammation. No gross ulcerations were seen. 2. Marked Gastric Inflammation This is located in the proximal stomach ram Cardia and was biopsied. 3. Normal antrum, pyloric channel, and duodenum with no sign of gastric outlet obstruction RECOMMENDATIONS: 1.  will follow clinically. Would repeat KUBand hold on NG suction for now. Could probably start on liquids if KUB okay We'll check path results.   _______________________________ Lorrin MaisLaurence Spates, MD 04/23/2015 9:12 AM     cc: Dr Excell Seltzer  CPT CODES: ICD CODES:  The ICD and CPT codes recommended by this software are interpretations from the data that the clinical staff has captured with the software.  The verification of the translation of this report to the ICD and CPT codes and modifiers is the sole responsibility of the health care institution and practicing physician where this report was generated.  [employer] will not be held responsible for the validity of the ICD and CPT codes included on this report.  AMA assumes no liability for data contained or not contained herein. CPT is a Designer, television/film set of the [employer].  PATIENT NAME:  [name], Mccoy MR#: [id number]

## 2015-04-24 ENCOUNTER — Inpatient Hospital Stay (HOSPITAL_COMMUNITY): Payer: Medicaid Other

## 2015-04-24 ENCOUNTER — Encounter (HOSPITAL_COMMUNITY): Payer: Self-pay | Admitting: Gastroenterology

## 2015-04-24 LAB — BASIC METABOLIC PANEL
Anion gap: 6 (ref 5–15)
CALCIUM: 8.7 mg/dL — AB (ref 8.9–10.3)
CHLORIDE: 111 mmol/L (ref 101–111)
CO2: 22 mmol/L (ref 22–32)
CREATININE: 0.84 mg/dL (ref 0.61–1.24)
GFR calc Af Amer: >60 mL/min (ref >60)
GFR calc non Af Amer: >60 mL/min (ref >60)
GLUCOSE: 102 mg/dL — AB (ref 65–99)
Potassium: 3.5 mmol/L (ref 3.5–5.1)
Sodium: 139 mmol/L (ref 135–145)

## 2015-04-24 LAB — MAGNESIUM: MAGNESIUM: 2.1 mg/dL (ref 1.7–2.4)

## 2015-04-24 MED ORDER — SACCHAROMYCES BOULARDII 250 MG PO CAPS
250.0000 mg | ORAL_CAPSULE | Freq: Two times a day (BID) | ORAL | Status: DC
  Administered 2015-04-24 – 2015-04-27 (×7): 250 mg via ORAL
  Filled 2015-04-24 (×7): qty 1

## 2015-04-24 MED ORDER — POTASSIUM CHLORIDE CRYS ER 20 MEQ PO TBCR
40.0000 meq | EXTENDED_RELEASE_TABLET | Freq: Once | ORAL | Status: AC
  Administered 2015-04-24: 40 meq via ORAL
  Filled 2015-04-24: qty 2

## 2015-04-24 MED ORDER — PANTOPRAZOLE SODIUM 40 MG IV SOLR
40.0000 mg | Freq: Two times a day (BID) | INTRAVENOUS | Status: DC
  Administered 2015-04-24 – 2015-04-27 (×7): 40 mg via INTRAVENOUS
  Filled 2015-04-24 (×7): qty 40

## 2015-04-24 NOTE — Progress Notes (Signed)
PROGRESS NOTE  [name] Mccoy. Luers [id number] DOB: Mar 30, 1961 DOA: 04/17/2015 PCP: Alvester Chou, NP  HPI/Recap of past 24 hours:  No new complains, returned for KUB,   on ivf, no n/v, denies ab pain, no fever, clean urine in foley bedbound with chronic contracture from MS.  Assessment/Plan: Principal Problem:   Nausea & vomiting Active Problems:   Hypokalemia   Sepsis (HCC)   Decubitus ulcer of buttock   Spasm of muscle   Protein-calorie malnutrition, severe (HCC)   UTI (lower urinary tract infection)   Lactic acidosis   Perforated viscus  Nausea & vomiting with possible coffee ground materials:  was admitted to SDU initially, now stable CT-abd/pelvis on admission concerning for gastric outlet obstruction with suspected gastric wall ischemia and possible perforation but no classic peritoneal signs.  - NG tube inserted and was pulled out by patient later - has been  NPO on ivf, iv ppi since admission -EGD 12/5 + severe proximal gastritis, biopsy pending. -kub on 12/6 stable, started clears per GI recommendation, diet advancement per GI.  likely able to d/c ivf in 1-2 days if able to tolerate diet. General surgeon and [facility] following.  Sepsis:  presented with severe sepsis with leukocytosis, fever, tachycardia, tachypnea and elevated lactate on admission Source of infection UTI? and gastritis/possible perforation of bowel. -IV vancomycin plus Zosyn started on admission per critical care Dr. [name] recommendation. -sepsis resolved culture unrevealing. Critical care signed off.  - d/v vanc on 12/3,  -continue zosyn for gi flora coverage ,day 7 on 12/6.  Hypokalemia: K= 2.7 on admission. -   Keep mag>2, K>4  UTI: with indwelling foley, foley changed by urology on 12/1 -was on IV vancomycin and Zosyn -blood culture and urine culture unrevealing vanc d/ed on 12/3, continued on zosyn.  Decubitus ulcer of buttock: -wound base clean, does not appear  infected -consultation wound care team  Protein-calorie malnutrition, severe ([place]): -has been npo since admission -start clears on 12/6, prealbumin ordered, nutrition consulted.  MS: At baseline he needs help with all his activities of daily living including bathing, toileting, feeding. He is on interferon-beta 1a treatment (plegdirdy) by Dr. [name] since August 2016 [prior GI intolerance with betasteron IFn-beta1b and tecfidera). - Continue home dantrolene, tizanidine, gabapentin and IFN-alpha -f/u with neurologist  MRSA colonization: finished decolonization protocol.   DVT ppx: SCD  Code Status: DNR  Family Communication: patient   Disposition Plan: possible d/c mid week if able to tolerate diet and cleared by GI.   Consultants:  [facility]  General surgery  Critical care, signed off  Procedures:  Ng insertion, patient pulled it out later  EGD 12/5  Antibiotics:  Vanc from admission, vanc d/ced on 12/3  Zosyn from 11/30   Objective: BP 113/70 mmHg  Pulse 88  Temp(Src) 98.6 F (37 C) (Oral)  Resp 17  Ht 6' 0.05" (1.83 m)  Wt 197 lb 1.5 oz (89.4 kg)  BMI 26.70 kg/m2  SpO2 98%  Intake/Output Summary (Last 24 hours) at 04/24/15 1314 Last data filed at 04/24/15 1124  Gross per 24 hour  Intake 9548.75 ml  Output   1425 ml  Net 8123.75 ml   Filed Weights   04/17/15 1709 04/18/15 0400  Weight: 206 lb (93.441 kg) 197 lb 1.5 oz (89.4 kg)    Exam:   General:  Chronic ill appearing, NAD  Cardiovascular: RRR  Respiratory: CTABL  Abdomen: Soft/ND/NT, positive BS  Musculoskeletal: No Edema, chronic contracture bilateral lower extremity  Neuro:  chronic paraplegia, contracture, intact sensation, indwelling foley  Skin; chronic decubitus ulcer, clean wound base. Dressing intact.  Data Reviewed: Basic Metabolic Panel:  Recent Labs Lab 04/18/15 0410 04/19/15 0530 04/20/15 0310 04/21/15 0431 04/22/15 0413 04/23/15 0416 04/24/15 0427  NA  142 140  --  139 139 140 139  K 4.0 3.6  --  3.4* 3.7 3.5 3.5  CL 114* 111  --  107 110 110 111  CO2 21* 25  --  22 21* 21* 22  GLUCOSE 149* 87  --  102* 113* 106* 102*  BUN 26* 10  --  5* <5* <5* <5*  CREATININE 0.92 0.74  --  0.78 0.76 0.82 0.84  CALCIUM 7.1* 7.6*  --  8.3* 8.4* 8.7* 8.7*  MG 2.4 2.2 2.1 2.1  --  1.9 2.1  PHOS 1.9* 2.2* 2.4* 2.7  --   --   --    Liver Function Tests:  Recent Labs Lab 04/17/15 1748 04/18/15 0410 04/19/15 0530  AST 42* 53* 23  ALT 42 59 35  ALKPHOS 60 67 60  BILITOT 0.9 1.6* 1.1  PROT 5.8* 6.4* 5.7*  ALBUMIN 2.6* 2.9* 2.7*    Recent Labs Lab 04/17/15 1748  LIPASE 18   No results for input(s): AMMONIA in the last 168 hours. CBC:  Recent Labs Lab 04/17/15 1748 04/18/15 0410 04/19/15 0530 04/20/15 0310 04/21/15 0431 04/23/15 0416  WBC 13.4* 14.9* 6.5 7.2 6.8 8.1  NEUTROABS 10.9*  --   --   --   --   --   HGB 12.4* 11.5* 10.0* 10.4* 11.4* 11.7*  HCT 38.8* 36.4* 31.2* 32.5* 35.8* 35.9*  MCV 78.9 78.3 80.0 80.0 79.6 79.6  PLT 234 229 185 224 317 405*   Cardiac Enzymes:    Recent Labs Lab 04/17/15 1748  CKTOTAL 24*   BNP (last 3 results) No results for input(s): BNP in the last 8760 hours.  ProBNP (last 3 results) No results for input(s): PROBNP in the last 8760 hours.  CBG:  Recent Labs Lab 04/20/15 0940 04/20/15 1457 04/21/15 0758 04/22/15 0744 04/23/15 0735  GLUCAP 65 81 101* 103* 93    Recent Results (from the past 240 hour(s))  Urine culture     Status: None   Collection Time: 04/17/15  5:34 PM  Result Value Ref Range Status   Specimen Description URINE, CATHETERIZED  Final   Special Requests NONE  Final   Culture   Final    MULTIPLE SPECIES PRESENT, SUGGEST RECOLLECTION Performed at [facility]    Report Status 04/18/2015 FINAL  Final  Blood Culture (routine x 2)     Status: None   Collection Time: 04/17/15  6:27 PM  Result Value Ref Range Status   Specimen Description BLOOD RIGHT ARM   Final   Special Requests IN PEDIATRIC BOTTLE 3ML  Final   Culture   Final    NO GROWTH 5 DAYS Performed at [facility]    Report Status 04/22/2015 FINAL  Final  MRSA PCR Screening     Status: Abnormal   Collection Time: 04/18/15  1:48 AM  Result Value Ref Range Status   MRSA by PCR POSITIVE (A) NEGATIVE Final    Comment:        The GeneXpert MRSA Assay (FDA approved for NASAL specimens only), is one component of a comprehensive MRSA colonization surveillance program. It is not intended to diagnose MRSA infection nor to guide or monitor treatment for MRSA infections. RESULT CALLED  TO, READ BACK BY AND VERIFIED WITH: Merceda Elks RN AT  [phone] ON 11.30.16 BY SHUEA   Blood Culture (routine x 2)     Status: None   Collection Time: 04/18/15  4:10 AM  Result Value Ref Range Status   Specimen Description BLOOD RIGHT ANTECUBITAL  Final   Special Requests BOTTLES DRAWN AEROBIC AND ANAEROBIC 5ML  Final   Culture   Final    NO GROWTH 5 DAYS Performed at [facility]    Report Status 04/23/2015 FINAL  Final     Studies: Dg Abd 2 Views  04/24/2015  CLINICAL DATA:  Pneumatosis. EXAM: ABDOMEN - 2 VIEW COMPARISON:  04/20/2015.  CT 04/17/2015. FINDINGS: No free air or evidence of obstruction. No organomegaly. Calcifications project over the abdomen which were shown on prior CT to be will subcutaneous within the anterior abdominal wall, likely from subcutaneous injections. IMPRESSION: No evidence of obstruction or free air. Electronically Signed   By: Rolm Baptise M.D.   On: 04/24/2015 09:17    Scheduled Meds: . amantadine  100 mg Oral BID  . dantrolene  25 mg Oral TID  . gluconic acid-citric acid  30 mL Irrigation Q3 days  . loratadine  10 mg Oral Daily  . montelukast  10 mg Oral q morning - 10a  . multivitamin with minerals  1 tablet Oral Daily  . nystatin  5 mL Oral QID  . pantoprazole (PROTONIX) IV  40 mg Intravenous Q12H  . [START ON 04/25/2015] Peginterferon Beta-1a   125 mcg Subcutaneous Q14 Days  . piperacillin-tazobactam (ZOSYN)  IV  3.375 g Intravenous 3 times per day  . sodium chloride  500 mL Intravenous Once  . sodium chloride  3 mL Intravenous Q12H  . tiZANidine  4 mg Oral QID  . triamcinolone   Topical BID    Continuous Infusions: . dextrose 5 % and 0.9% NaCl 1,000 mL (04/24/15 0041)     Time spent: 65mins  Adilson Grafton MD, PhD  [facility] Pager [phone]. If 7PM-7AM, please contact night-coverage at [email or web address], password [facility] 04/24/2015, 1:14 PM  LOS: 6 days

## 2015-04-24 NOTE — Progress Notes (Signed)
[facility] PROGRESS NOTE Subjective Pt absolutely w/o any nausea or AP Abd film pending.  Objective: Vital signs in last 24 hours: Temp:  [96.7 F (35.9 C)-98.6 F (37 C)] 98.6 F (37 C) (12/06 0650) Pulse Rate:  [88-110] 88 (12/06 0650) Resp:  [17-30] 17 (12/06 0650) BP: (97-126)/(59-81) 113/70 mmHg (12/06 0650) SpO2:  [94 %-100 %] 98 % (12/06 0650) Last BM Date: 04/21/15  Intake/Output from previous day: 12/05 0701 - 12/06 0700 In: 300 [I.V.:300] Out: 1150 [Urine:1150] Intake/Output this shift:    PE: General--NAD, alert   Abdomen--soft nontender, nondistended Lab Results:  Recent Labs  04/23/15 0416  WBC 8.1  HGB 11.7*  HCT 35.9*  PLT 405*   BMET  Recent Labs  04/22/15 0413 04/23/15 0416 04/24/15 0427  NA 139 140 139  K 3.7 3.5 3.5  CL 110 110 111  CO2 21* 21* 22  CREATININE 0.76 0.82 0.84   LFT No results for input(s): PROT, AST, ALT, ALKPHOS, BILITOT, BILIDIR, IBILI in the last 72 hours. PT/INR No results for input(s): LABPROT, INR in the last 72 hours. PANCREAS No results for input(s): LIPASE in the last 72 hours.       Studies/Results: No results found.  Medications: I have reviewed the patient's current medications.  Assessment/Plan: 1. Abnormal CT/+stools.  Clinically much better, would check the KUB and if no worse from the air insufflation from the EGD yesterday, would start clear liquids. Path should be back tomorrow.   Dev Dhondt JR,Jahquan Klugh L 04/24/2015, 8:09 AM  Pager: [phone] If no answer or after hours call [phone]

## 2015-04-24 NOTE — Progress Notes (Signed)
1 Day Post-Op  Subjective: No pain and no real complaints.    Objective: Vital signs in last 24 hours: Temp:  [96.7 F (35.9 C)-98.6 F (37 C)] 98.6 F (37 C) (12/06 0650) Pulse Rate:  [88-110] 88 (12/06 0650) Resp:  [17-30] 17 (12/06 0650) BP: (97-126)/(59-76) 113/70 mmHg (12/06 0650) SpO2:  [96 %-100 %] 98 % (12/06 0650) Last BM Date: 04/21/15 NPO Afebrile, VSS BMP OK Abdominal film still pending Biopsy pending Intake/Output from previous day: 12/05 0701 - 12/06 0700 In: 300 [I.V.:300] Out: 1150 [Urine:1150] Intake/Output this shift:    General appearance: alert, cooperative and no distress GI: soft, non-tender; bowel sounds normal; no masses,  no organomegaly  Lab Results:   Recent Labs  04/23/15 0416  WBC 8.1  HGB 11.7*  HCT 35.9*  PLT 405*    BMET  Recent Labs  04/23/15 0416 04/24/15 0427  NA 140 139  K 3.5 3.5  CL 110 111  CO2 21* 22  GLUCOSE 106* 102*  BUN <5* <5*  CREATININE 0.82 0.84  CALCIUM 8.7* 8.7*   PT/INR No results for input(s): LABPROT, INR in the last 72 hours.   Recent Labs Lab 04/17/15 1748 04/18/15 0410 04/19/15 0530  AST 42* 53* 23  ALT 42 59 35  ALKPHOS 60 67 60  BILITOT 0.9 1.6* 1.1  PROT 5.8* 6.4* 5.7*  ALBUMIN 2.6* 2.9* 2.7*     Lipase     Component Value Date/Time   LIPASE 18 04/17/2015 1748     Studies/Results: No results found.  Medications: . amantadine  100 mg Oral BID  . dantrolene  25 mg Oral TID  . gluconic acid-citric acid  30 mL Irrigation Q3 days  . loratadine  10 mg Oral Daily  . montelukast  10 mg Oral q morning - 10a  . multivitamin with minerals  1 tablet Oral Daily  . nystatin  5 mL Oral QID  . pantoprazole (PROTONIX) IV  40 mg Intravenous Q12H  . [START ON 04/25/2015] Peginterferon Beta-1a  125 mcg Subcutaneous Q14 Days  . piperacillin-tazobactam (ZOSYN)  IV  3.375 g Intravenous 3 times per day  . sodium chloride  500 mL Intravenous Once  . sodium chloride  3 mL Intravenous Q12H  .  tiZANidine  4 mg Oral QID  . triamcinolone   Topical BID    Assessment/Plan Acute gastric distention with pneumatosis Gastric inflammation on EGD, bx pending Nausea and vomiting Multiple sclerosis bed bound  Contractures/muscle spasm Neurogenic bladder  Hx of nephrolithiasis Sacral decubitus debridement 09/2012 PCM Antibiotics:  Day 8 Zosyn DVT: SCD    Plan:  Film pending, hoping to start clears later today, biopsy pending.    LOS: 6 days    Timmi Devora 04/24/2015

## 2015-04-25 DIAGNOSIS — M62838 Other muscle spasm: Secondary | ICD-10-CM

## 2015-04-25 DIAGNOSIS — E43 Unspecified severe protein-calorie malnutrition: Secondary | ICD-10-CM

## 2015-04-25 DIAGNOSIS — R69 Illness, unspecified: Secondary | ICD-10-CM

## 2015-04-25 DIAGNOSIS — N390 Urinary tract infection, site not specified: Secondary | ICD-10-CM

## 2015-04-25 LAB — CBC
HCT: 36.7 % — ABNORMAL LOW (ref 39.0–52.0)
HEMOGLOBIN: 11.4 g/dL — AB (ref 13.0–17.0)
MCH: 25.3 pg — ABNORMAL LOW (ref 26.0–34.0)
MCHC: 31.1 g/dL (ref 30.0–36.0)
MCV: 81.6 fL (ref 78.0–100.0)
PLATELETS: 444 10*3/uL — AB (ref 150–400)
RBC: 4.5 MIL/uL (ref 4.22–5.81)
RDW: 16.7 % — ABNORMAL HIGH (ref 11.5–15.5)
WBC: 7.1 10*3/uL (ref 4.0–10.5)

## 2015-04-25 LAB — MAGNESIUM: Magnesium: 2.1 mg/dL (ref 1.7–2.4)

## 2015-04-25 LAB — BASIC METABOLIC PANEL
ANION GAP: 5 (ref 5–15)
BUN: <5 mg/dL — ABNORMAL LOW (ref 6–20)
CO2: 25 mmol/L (ref 22–32)
Calcium: 8.8 mg/dL — ABNORMAL LOW (ref 8.9–10.3)
Chloride: 110 mmol/L (ref 101–111)
Creatinine, Ser: 0.87 mg/dL (ref 0.61–1.24)
GLUCOSE: 109 mg/dL — AB (ref 65–99)
POTASSIUM: 3.7 mmol/L (ref 3.5–5.1)
Sodium: 140 mmol/L (ref 135–145)

## 2015-04-25 LAB — GLUCOSE, CAPILLARY: GLUCOSE-CAPILLARY: 100 mg/dL — AB (ref 65–99)

## 2015-04-25 LAB — PHOSPHORUS: PHOSPHORUS: 2.8 mg/dL (ref 2.5–4.6)

## 2015-04-25 LAB — PREALBUMIN: PREALBUMIN: 14.3 mg/dL — AB (ref 18–38)

## 2015-04-25 MED ORDER — ENSURE ENLIVE PO LIQD
237.0000 mL | Freq: Two times a day (BID) | ORAL | Status: DC
  Administered 2015-04-25 – 2015-04-27 (×4): 237 mL via ORAL

## 2015-04-25 MED ORDER — JUVEN PO PACK
1.0000 | PACK | Freq: Every day | ORAL | Status: DC
  Administered 2015-04-25: 1 via ORAL
  Filled 2015-04-25 (×2): qty 1

## 2015-04-25 NOTE — Progress Notes (Signed)
CSW received referral to anticipate discharge in 1-2 days for home health vs. SNF.   CSW received notification from [facility] that RNCM followed up with pt today and pt confirmed his wishes to return home at discharge with home health services and caregivers.   No social work needs identified.   CSW signing off.   Alison Murray, MSW, [place] Work [phone]

## 2015-04-25 NOTE — Plan of Care (Signed)
Problem: Skin Integrity: Goal: Risk for impaired skin integrity will decrease Outcome: Not Progressing Pt has stage II ulcers to hip and sacrum, declines routine turning and calls only when he wants to be repositioned. Declines routine dressing changes to the areas.

## 2015-04-25 NOTE — Progress Notes (Signed)
2 Days Post-Op  Subjective: He continues to be pain free.  No problems with clears.    Objective: Vital signs in last 24 hours: Temp:  [97.5 F (36.4 C)-97.6 F (36.4 C)] 97.6 F (36.4 C) May 19, 2023 2252) Pulse Rate:  [79-81] 81 (12/07 0527) Resp:  [14-20] 14 (12/07 0527) BP: (109-117)/(68-74) 117/68 mmHg (12/07 0527) SpO2:  [98 %-100 %] 98 % (12/07 0527) Last BM Date: 05-19-2015 (incont) 480 PO + BM x 1 Afebrile, VSS Labs OK this AM Film yesterday: No free air or evidence of obstruction. No organomegaly. Calcifications project over the abdomen which were shown on prior CT to be will subcutaneous within the anterior abdominal wall, likely from subcutaneous injections. Intake/Output from previous day: May 19, 2023 0701 - 12/07 0700 In: 480 [P.O.:480] Out: 1425 [Urine:1425] Intake/Output this shift:    General appearance: alert, cooperative and no distress GI: soft, non-tender; bowel sounds normal; no masses,  no organomegaly  Lab Results:   Recent Labs  04/23/15 0416 04/25/15 0520  WBC 8.1 7.1  HGB 11.7* 11.4*  HCT 35.9* 36.7*  PLT 405* 444*    BMET  Recent Labs  19-May-2015 0427 04/25/15 0520  NA 139 140  K 3.5 3.7  CL 111 110  CO2 22 25  GLUCOSE 102* 109*  BUN <5* <5*  CREATININE 0.84 0.87  CALCIUM 8.7* 8.8*   PT/INR No results for input(s): LABPROT, INR in the last 72 hours.   Recent Labs Lab 04/19/15 0530  AST 23  ALT 35  ALKPHOS 60  BILITOT 1.1  PROT 5.7*  ALBUMIN 2.7*     Lipase     Component Value Date/Time   LIPASE 18 04/17/2015 1748     Studies/Results: Dg Abd 2 Views  May 19, 2015  CLINICAL DATA:  Pneumatosis. EXAM: ABDOMEN - 2 VIEW COMPARISON:  04/20/2015.  CT 04/17/2015. FINDINGS: No free air or evidence of obstruction. No organomegaly. Calcifications project over the abdomen which were shown on prior CT to be will subcutaneous within the anterior abdominal wall, likely from subcutaneous injections. IMPRESSION: No evidence of obstruction or  free air. Electronically Signed   By: Rolm Baptise M.D.   On: May 19, 2015 09:17    Medications: . amantadine  100 mg Oral BID  . dantrolene  25 mg Oral TID  . gluconic acid-citric acid  30 mL Irrigation Q3 days  . loratadine  10 mg Oral Daily  . montelukast  10 mg Oral q morning - 10a  . multivitamin with minerals  1 tablet Oral Daily  . nystatin  5 mL Oral QID  . pantoprazole (PROTONIX) IV  40 mg Intravenous Q12H  . Peginterferon Beta-1a  125 mcg Subcutaneous Q14 Days  . piperacillin-tazobactam (ZOSYN)  IV  3.375 g Intravenous 3 times per day  . saccharomyces boulardii  250 mg Oral BID  . sodium chloride  500 mL Intravenous Once  . sodium chloride  3 mL Intravenous Q12H  . tiZANidine  4 mg Oral QID  . triamcinolone   Topical BID  Stomach, biopsy, gastric cardia - SLIGHT CHRONIC GASTRITIS AND HYPEREMIA. - WARTHIN-STARRY STAIN NEGATIVE FOR HELICOBACTER PYLORI. - NO INTESTINAL METAPLASIA, DYSPLASIA OR MALIGNANCY.  Assessment/Plan Acute gastric distention with pneumatosis Gastric inflammation on EGD, above   Nausea and vomiting Multiple sclerosis bed bound  Contractures/muscle spasm Neurogenic bladder  Hx of nephrolithiasis Sacral decubitus debridement 09/2012 PCM Antibiotics: Day 8 Zosyn DVT: SCD    Plan:  Agree with DR. Edwards, advance diet and home on PPI if he does  well.  I will go ahead and start fulls for him. Call if there is anything we can help with .    LOS: 7 days    Aranza Geddes 04/25/2015

## 2015-04-25 NOTE — Progress Notes (Signed)
Initial Nutrition Assessment  INTERVENTION:   -Provide Ensure Enlive po BID, each supplement provides 350 kcal and 20 grams of protein -Provide Juven daily, each packets provides 80 kcal and 14g of protein. -Encourage PO intake -RD to continue to monitor  NUTRITION DIAGNOSIS:   Increased nutrient needs related to wound healing as evidenced by estimated needs.   GOAL:   Patient will meet greater than or equal to 90% of their needs  MONITOR:   PO intake, Supplement acceptance, Labs, Weight trends, Skin, I & O's  REASON FOR ASSESSMENT:   Consult Assessment of nutrition requirement/status  ASSESSMENT:   [name] Mccoy is a 54 y.o. male with PMH of multiple sclerosis Diagnosed 77 33, currently advanced with baseline bedbound last walked in February 2013 and being in contractures and muscle spasm, kidney stone, neurogenic bladder, DO NOT RESUSCITATE, who presents with nausea, vomiting, and generalized weakness.  Patient has been NPO since admission on 11/29 (8 days). Pt now on full liquids since he was tolerating clear liquids. Pt reports feeling very hungry and denies nausea. States that he hasn't eaten any solid food for 2 weeks. Pt would like Ensure supplements, RD to order. RD to also order Juven supplement daily for extra protein to aid in wound healing. Pt with recent weight gain.   Nutrition focused physical exam shows no sign of depletion of muscle mass or body fat.  Labs reviewed: Low BUN Mg/Phos WNL  Diet Order:  Diet full liquid Room service appropriate?: Yes; Fluid consistency:: Thin  Skin:   Stage II hip and sacral ulcers  Last BM:  12/6  Height:   Ht Readings from Last 1 Encounters:  04/18/15 6' 0.05" (1.83 m)    Weight:   Wt Readings from Last 1 Encounters:  04/18/15 197 lb 1.5 oz (89.4 kg)    Ideal Body Weight:  80.9 kg  BMI:  Body mass index is 26.7 kg/(m^2).  Estimated Nutritional Needs:   Kcal:  2400-2600  Protein:  120-130g  Fluid:   2.4L/day  EDUCATION NEEDS:   No education needs identified at this time  Clayton Bibles, MS, RD, LDN Pager: [phone] After Hours Pager: [phone]

## 2015-04-25 NOTE — Progress Notes (Signed)
[facility] PROGRESS NOTE Subjective Tolerated clears, xray negative. Path shows nonspecific gastritis  Objective: Vital signs in last 24 hours: Temp:  [97.5 F (36.4 C)-97.6 F (36.4 C)] 97.6 F (36.4 C) May 24, 2023 2252) Pulse Rate:  [79-81] 81 (12/07 0527) Resp:  [14-20] 14 (12/07 0527) BP: (109-117)/(68-74) 117/68 mmHg (12/07 0527) SpO2:  [98 %-100 %] 98 % (12/07 0527) Last BM Date: 05-24-2015 (incont)  Intake/Output from previous day: 2023/05/24 0701 - 12/07 0700 In: 480 [P.O.:480] Out: 1425 [Urine:1425] Intake/Output this shift:    PE: General--NAD  Abdomen--soft and nontender  Lab Results:  Recent Labs  04/23/15 0416 04/25/15 0520  WBC 8.1 7.1  HGB 11.7* 11.4*  HCT 35.9* 36.7*  PLT 405* 444*   BMET  Recent Labs  04/23/15 0416 05-24-15 0427 04/25/15 0520  NA 140 139 140  K 3.5 3.5 3.7  CL 110 111 110  CO2 21* 22 25  CREATININE 0.82 0.84 0.87   LFT No results for input(s): PROT, AST, ALT, ALKPHOS, BILITOT, BILIDIR, IBILI in the last 72 hours. PT/INR No results for input(s): LABPROT, INR in the last 72 hours. PANCREAS No results for input(s): LIPASE in the last 72 hours.       Studies/Results: Dg Abd 2 Views  24-May-2015  CLINICAL DATA:  Pneumatosis. EXAM: ABDOMEN - 2 VIEW COMPARISON:  04/20/2015.  CT 04/17/2015. FINDINGS: No free air or evidence of obstruction. No organomegaly. Calcifications project over the abdomen which were shown on prior CT to be will subcutaneous within the anterior abdominal wall, likely from subcutaneous injections. IMPRESSION: No evidence of obstruction or free air. Electronically Signed   By: Rolm Baptise M.D.   On: May 24, 2015 09:17    Medications: I have reviewed the patient's current medications.  Assessment/Plan: 1. Abnormal CT/+stools. Appears to have resolved. Will advance diet to full liquids and if tolerated, would advance to low residue. Should be ok to discharge from our viewpoint on chronic PPIs if  tolerates diet.   Quinnlan Abruzzo JR,Aradia Estey L 04/25/2015, 7:58 AM  Pager: [phone] If no answer or after hours call [phone]

## 2015-04-25 NOTE — Progress Notes (Signed)
This CM checked in with pt to confirm that his wishes were still to go home at discharge. Pt states he is still planning on going home with home health services and his caregivers. CM will continue to follow. Marney Doctor RN,BSN,NCM [phone]

## 2015-04-25 NOTE — Progress Notes (Signed)
PROGRESS NOTE  [name] Mccoy. Barberi [id number] DOB: [birth year] DOA: 04/17/2015 PCP: Alvester Chou, NP  54 y/o ? Multiple sclerosis sicne 1983[baseline bedbound] Neurogenic bladder Severe P-EM Prior r Perc Nephrolithotomy 01/30/13 Chr R Femoral head Osteonecrosis  Admit  04/18/15 n/v and coffee ground emesis eventually as well as lactic acidosis Ct concerning for Pneumatosis  HPI/Recap of past 24 hours:  Well no issues  joking No fever no chills no n/v No cp  Assessment/Plan: Principal Problem:   Nausea & vomiting Active Problems:   Hypokalemia   Sepsis (HCC)   Decubitus ulcer of buttock   Spasm of muscle   Protein-calorie malnutrition, severe (HCC)   UTI (lower urinary tract infection)   Lactic acidosis   Perforated viscus  Nausea & vomiting with possible coffee ground materials:  was admitted to SDU initially, now stable CT-abd/pelvis on admission concerning for gastric outlet obstruction with suspected gastric wall ischemia and possible perforation but no classic peritoneal signs.  - NG tube inserted and was pulled out by patient later - graduated diet as per GI -EGD 12/5 + severe proximal gastritis, biopsy neg for malignancy and only showing severe Gastritis -kub on 12/6 stable -Appreciate General surgeon and [facility] following.  Sepsis:  presented with severe sepsis with leukocytosis, fever, tachycardia, tachypnea and elevated lactate on admission Source of infection UTI? and gastritis/possible perforation of bowel. -IV vancomycin plus Zosyn started on admission per critical care Dr. [name] recommendation. -sepsis resolved culture unrevealing. Critical care signed off.  - d/v vanc on 12/3,  -continued zosyn for gi flora coverage  Until  12/6. -CBC in the am  Hypokalemia: K= 2.7 on admission. -   Keep mag>2, K>4  UTI: with indwelling foley, foley changed by urology on 12/1 -was on IV vancomycin and Zosyn -blood culture and urine culture  unrevealing  Decubitus ulcer of buttock: -wound base clean, does not appear infected -consultation wound care team  Protein-calorie malnutrition, severe ([place]): nutrition consulted.  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 [name] since August 2016  [prior GI intolerance with betasteron IFn-beta1b and tecfidera). - Continue home dantrolene, tizanidine, gabapentin and IFN-alpha -f/u with neurologist  MRSA colonization: finished decolonization protocol.   DVT ppx: SCD  Code Status: DNR  Family Communication: patient   Disposition Plan: possible d/c 24-48 hrs if tolerating full meals and no issues  Consultants:  [facility]  General surgery  Critical care, signed off  Procedures:  Ng insertion, patient pulled it out later  EGD 12/5  Antibiotics:  Vanc from admission, vanc d/ced on 12/3  Zosyn from 11/30-->12/6   Objective: BP 129/75 mmHg  Pulse 83  Temp(Src) 97.5 F (36.4 C) (Oral)  Resp 15  Ht 6' 0.05" (1.83 m)  Wt 89.4 kg (197 lb 1.5 oz)  BMI 26.70 kg/m2  SpO2 99%  Intake/Output Summary (Last 24 hours) at 04/25/15 1121 Last data filed at 04/25/15 1027  Gross per 24 hour  Intake    840 ml  Output   1775 ml  Net   -935 ml   Filed Weights   04/17/15 1709 04/18/15 0400  Weight: 93.441 kg (206 lb) 89.4 kg (197 lb 1.5 oz)    Exam:   General:  Chronic ill appearing, NAD  Cardiovascular: RRR  Respiratory: CTABL  Abdomen: Soft/ND/NT, positive BS  Musculoskeletal: No Edema, chronic contracture bilateral lower extremity  Neuro: chronic paraplegia, contracture, intact sensation, indwelling foley  Skin  not examined today  Data Reviewed: Basic Metabolic Panel:  Recent Labs Lab 04/19/15 0530 04/20/15 0310 04/21/15 0431 04/22/15 0413 04/23/15 0416 04/24/15 0427 04/25/15 0520  NA 140  --  139 139 140 139 140  K 3.6  --  3.4* 3.7 3.5 3.5 3.7  CL 111  --  107 110 110 111 110   CO2 25  --  22 21* 21* 22 25  GLUCOSE 87  --  102* 113* 106* 102* 109*  BUN 10  --  5* <5* <5* <5* <5*  CREATININE 0.74  --  0.78 0.76 0.82 0.84 0.87  CALCIUM 7.6*  --  8.3* 8.4* 8.7* 8.7* 8.8*  MG 2.2 2.1 2.1  --  1.9 2.1 2.1  PHOS 2.2* 2.4* 2.7  --   --   --  2.8   Liver Function Tests:  Recent Labs Lab 04/19/15 0530  AST 23  ALT 35  ALKPHOS 60  BILITOT 1.1  PROT 5.7*  ALBUMIN 2.7*   No results for input(s): LIPASE, AMYLASE in the last 168 hours. No results for input(s): AMMONIA in the last 168 hours. CBC:  Recent Labs Lab 04/19/15 0530 04/20/15 0310 04/21/15 0431 04/23/15 0416 04/25/15 0520  WBC 6.5 7.2 6.8 8.1 7.1  HGB 10.0* 10.4* 11.4* 11.7* 11.4*  HCT 31.2* 32.5* 35.8* 35.9* 36.7*  MCV 80.0 80.0 79.6 79.6 81.6  PLT 185 224 317 405* 444*   Cardiac Enzymes:   No results for input(s): CKTOTAL, CKMB, CKMBINDEX, TROPONINI in the last 168 hours. BNP (last 3 results) No results for input(s): BNP in the last 8760 hours.  ProBNP (last 3 results) No results for input(s): PROBNP in the last 8760 hours.  CBG:  Recent Labs Lab 04/20/15 1457 04/21/15 0758 04/22/15 0744 04/23/15 0735 04/25/15 0804  GLUCAP 81 101* 103* 93 100*    Recent Results (from the past 240 hour(s))  Urine culture     Status: None   Collection Time: 04/17/15  5:34 PM  Result Value Ref Range Status   Specimen Description URINE, CATHETERIZED  Final   Special Requests NONE  Final   Culture   Final    MULTIPLE SPECIES PRESENT, SUGGEST RECOLLECTION Performed at [facility]    Report Status 04/18/2015 FINAL  Final  Blood Culture (routine x 2)     Status: None   Collection Time: 04/17/15  6:27 PM  Result Value Ref Range Status   Specimen Description BLOOD RIGHT ARM  Final   Special Requests IN PEDIATRIC BOTTLE 3ML  Final   Culture   Final    NO GROWTH 5 DAYS Performed at [facility]    Report Status 04/22/2015 FINAL  Final  MRSA PCR Screening     Status: Abnormal    Collection Time: 04/18/15  1:48 AM  Result Value Ref Range Status   MRSA by PCR POSITIVE (A) NEGATIVE Final    Comment:        The GeneXpert MRSA Assay (FDA approved for NASAL specimens only), is one component of a comprehensive MRSA colonization surveillance program. It is not intended to diagnose MRSA infection nor to guide or monitor treatment for MRSA infections. RESULT CALLED TO, READ BACK BY AND VERIFIED WITH: Merceda Elks RN AT  [phone] ON 11.30.16 BY SHUEA   Blood Culture (routine x 2)     Status: None   Collection Time: 04/18/15  4:10 AM  Result Value Ref Range Status   Specimen Description BLOOD RIGHT ANTECUBITAL  Final  Special Requests BOTTLES DRAWN AEROBIC AND ANAEROBIC 5ML  Final   Culture   Final    NO GROWTH 5 DAYS Performed at [facility]    Report Status 04/23/2015 FINAL  Final     Studies: No results found.  Scheduled Meds: . amantadine  100 mg Oral BID  . dantrolene  25 mg Oral TID  . feeding supplement (ENSURE ENLIVE)  237 mL Oral BID BM  . gluconic acid-citric acid  30 mL Irrigation Q3 days  . loratadine  10 mg Oral Daily  . montelukast  10 mg Oral q morning - 10a  . multivitamin with minerals  1 tablet Oral Daily  . nutrition supplement (JUVEN)  1 packet Oral Q2000  . nystatin  5 mL Oral QID  . pantoprazole (PROTONIX) IV  40 mg Intravenous Q12H  . Peginterferon Beta-1a  125 mcg Subcutaneous Q14 Days  . piperacillin-tazobactam (ZOSYN)  IV  3.375 g Intravenous 3 times per day  . saccharomyces boulardii  250 mg Oral BID  . sodium chloride  500 mL Intravenous Once  . sodium chloride  3 mL Intravenous Q12H  . tiZANidine  4 mg Oral QID  . triamcinolone   Topical BID    Continuous Infusions: . dextrose 5 % and 0.9% NaCl 75 mL/hr at 04/25/15 0138     Time spent: 42mins   Verneita Griffes, MD [facility] (P) [phone]

## 2015-04-26 LAB — CBC WITH DIFFERENTIAL/PLATELET
Basophils Absolute: 0 10*3/uL (ref 0.0–0.1)
Basophils Relative: 1 %
EOS PCT: 7 %
Eosinophils Absolute: 0.5 10*3/uL (ref 0.0–0.7)
HEMATOCRIT: 36.5 % — AB (ref 39.0–52.0)
Hemoglobin: 11.6 g/dL — ABNORMAL LOW (ref 13.0–17.0)
LYMPHS ABS: 1.4 10*3/uL (ref 0.7–4.0)
LYMPHS PCT: 22 %
MCH: 25.4 pg — AB (ref 26.0–34.0)
MCHC: 31.8 g/dL (ref 30.0–36.0)
MCV: 80 fL (ref 78.0–100.0)
MONO ABS: 1 10*3/uL (ref 0.1–1.0)
Monocytes Relative: 15 %
NEUTROS ABS: 3.7 10*3/uL (ref 1.7–7.7)
Neutrophils Relative %: 55 %
PLATELETS: 417 10*3/uL — AB (ref 150–400)
RBC: 4.56 MIL/uL (ref 4.22–5.81)
RDW: 16.5 % — AB (ref 11.5–15.5)
WBC: 6.6 10*3/uL (ref 4.0–10.5)

## 2015-04-26 LAB — GLUCOSE, CAPILLARY: Glucose-Capillary: 96 mg/dL (ref 65–99)

## 2015-04-26 NOTE — Progress Notes (Signed)
PROGRESS NOTE  [name] Mccoy [id number] DOB: March 28, 1961 DOA: 04/17/2015 PCP: Alvester Chou, NP  54 y/o ? Multiple sclerosis sicne 1983[baseline bedbound] Neurogenic bladder Severe P-EM Prior r Perc Nephrolithotomy 01/30/13 Chr R Femoral head Osteonecrosis  Admit  04/18/15 n/v and coffee ground emesis eventually as well as lactic acidosis Ct concerning for Pneumatosis  HPI/Recap of past 24 hours:  Mild abd pain this am No n/v cp No blood in stool yesterday otherwsie about he same  Assessment/Plan: Principal Problem:   Nausea & vomiting Active Problems:   Hypokalemia   Sepsis ([place])   Decubitus ulcer of buttock   Spasm of muscle   Protein-calorie malnutrition, severe (HCC)   UTI (lower urinary tract infection)   Lactic acidosis   Perforated viscus  Nausea & vomiting with possible coffee ground materials:  was admitted to SDU initially, now stable CT-abd/pelvis on admission concerning for gastric outlet obstruction with suspected gastric wall ischemia and possible perforation but no classic peritoneal signs.  - NG tube inserted and was pulled out by patient later - graduated diet as per GI -EGD 12/5 + severe proximal gastritis, biopsy neg for malignancy and only showing severe Gastritis -kub on 12/6 stable -Graduate to soft diet and monitor carefully for resolution today -Appreciate General surgeon and [facility] following. -If abdomen is soft nontender and tolerating full diet may discharge home later on in the next 24 hours  Sepsis:  presented with severe sepsis with leukocytosis, fever, tachycardia, tachypnea and elevated lactate on admission Source of infection UTI? and gastritis/possible perforation of bowel. -IV vancomycin plus Zosyn started on admission per critical care Dr. [name] recommendation. -sepsis resolved culture unrevealing. Critical care signed off.  - d/v vanc on 12/3,  -continued zosyn for gi flora coverage Until  12/6. -CBC in the  am  Hypokalemia: K= 2.7 on admission. -   Keep mag>2, K>4  UTI: with indwelling foley, foley changed by urology on 12/1 -was on IV vancomycin and Zosyn -blood culture and urine culture unrevealing  Decubitus ulcer of buttock: -wound base clean, does not appear infected -consultation wound care team  Protein-calorie malnutrition, severe ([place]): nutrition consulted.  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 [name] since August 2016  [prior GI intolerance with betasteron IFn-beta1b and tecfidera). - Continue home dantrolene, tizanidine, gabapentin and IFN-alpha -f/u with neurologist  MRSA colonization: finished decolonization protocol.   DVT ppx: SCD  Code Status: DNR  Family Communication: patient   Disposition Plan: possible d/c 24-48 hrs if tolerating full meals and no issues  Consultants:  [facility]  General surgery  Critical care, signed off  Procedures:  Ng insertion, patient pulled it out later  EGD 12/5  Antibiotics:  Vanc from admission, vanc d/ced on 12/3  Zosyn from 11/30-->12/6   Objective: BP 103/68 mmHg  Pulse 88  Temp(Src) 99 F (37.2 C) (Oral)  Resp 20  Ht 6' 0.05" (1.83 m)  Wt 89.4 kg (197 lb 1.5 oz)  BMI 26.70 kg/m2  SpO2 95%  Intake/Output Summary (Last 24 hours) at 04/26/15 1030 Last data filed at 04/26/15 [id number]  Gross per 24 hour  Intake 2800.29 ml  Output    850 ml  Net 1950.29 ml   Filed Weights   04/17/15 1709 04/18/15 0400  Weight: 93.441 kg (206 lb) 89.4 kg (197 lb 1.5 oz)    Exam:   General:  Chronic ill appearing, NAD  Cardiovascular: RRR  Respiratory: CTABL  Abdomen: Soft/ND/NT, positive BS  Musculoskeletal: No Edema, chronic contracture bilateral lower extremity  Neuro: chronic paraplegia, contracture, intact sensation, indwelling foley  Skin not examined today  Data Reviewed: Basic Metabolic Panel:  Recent Labs Lab 04/20/15 0310  04/21/15 0431 04/22/15 0413 04/23/15 0416 04/24/15 0427 04/25/15 0520  NA  --  139 139 140 139 140  K  --  3.4* 3.7 3.5 3.5 3.7  CL  --  107 110 110 111 110  CO2  --  22 21* 21* 22 25  GLUCOSE  --  102* 113* 106* 102* 109*  BUN  --  5* <5* <5* <5* <5*  CREATININE  --  0.78 0.76 0.82 0.84 0.87  CALCIUM  --  8.3* 8.4* 8.7* 8.7* 8.8*  MG 2.1 2.1  --  1.9 2.1 2.1  PHOS 2.4* 2.7  --   --   --  2.8   Liver Function Tests: No results for input(s): AST, ALT, ALKPHOS, BILITOT, PROT, ALBUMIN in the last 168 hours. No results for input(s): LIPASE, AMYLASE in the last 168 hours. No results for input(s): AMMONIA in the last 168 hours. CBC:  Recent Labs Lab 04/20/15 0310 04/21/15 0431 04/23/15 0416 04/25/15 0520 04/26/15 0357  WBC 7.2 6.8 8.1 7.1 6.6  NEUTROABS  --   --   --   --  3.7  HGB 10.4* 11.4* 11.7* 11.4* 11.6*  HCT 32.5* 35.8* 35.9* 36.7* 36.5*  MCV 80.0 79.6 79.6 81.6 80.0  PLT 224 317 405* 444* 417*   Cardiac Enzymes:   No results for input(s): CKTOTAL, CKMB, CKMBINDEX, TROPONINI in the last 168 hours. BNP (last 3 results) No results for input(s): BNP in the last 8760 hours.  ProBNP (last 3 results) No results for input(s): PROBNP in the last 8760 hours.  CBG:  Recent Labs Lab 04/21/15 0758 04/22/15 0744 04/23/15 0735 04/25/15 0804 04/26/15 0752  GLUCAP 101* 103* 93 100* 96    Recent Results (from the past 240 hour(s))  Urine culture     Status: None   Collection Time: 04/17/15  5:34 PM  Result Value Ref Range Status   Specimen Description URINE, CATHETERIZED  Final   Special Requests NONE  Final   Culture   Final    MULTIPLE SPECIES PRESENT, SUGGEST RECOLLECTION Performed at [facility]    Report Status 04/18/2015 FINAL  Final  Blood Culture (routine x 2)     Status: None   Collection Time: 04/17/15  6:27 PM  Result Value Ref Range Status   Specimen Description BLOOD RIGHT ARM  Final   Special Requests IN PEDIATRIC BOTTLE 3ML  Final    Culture   Final    NO GROWTH 5 DAYS Performed at [facility]    Report Status 04/22/2015 FINAL  Final  MRSA PCR Screening     Status: Abnormal   Collection Time: 04/18/15  1:48 AM  Result Value Ref Range Status   MRSA by PCR POSITIVE (A) NEGATIVE Final    Comment:        The GeneXpert MRSA Assay (FDA approved for NASAL specimens only), is one component of a comprehensive MRSA colonization surveillance program. It is not intended to diagnose MRSA infection nor to guide or monitor treatment for MRSA infections. RESULT CALLED TO, READ BACK BY AND VERIFIED WITH: Merceda Elks RN AT  [phone] ON 11.30.16 BY SHUEA   Blood Culture (routine x 2)     Status: None   Collection Time:  04/18/15  4:10 AM  Result Value Ref Range Status   Specimen Description BLOOD RIGHT ANTECUBITAL  Final   Special Requests BOTTLES DRAWN AEROBIC AND ANAEROBIC 5ML  Final   Culture   Final    NO GROWTH 5 DAYS Performed at [facility]    Report Status 04/23/2015 FINAL  Final     Studies: No results found.  Scheduled Meds: . amantadine  100 mg Oral BID  . dantrolene  25 mg Oral TID  . feeding supplement (ENSURE ENLIVE)  237 mL Oral BID BM  . gluconic acid-citric acid  30 mL Irrigation Q3 days  . loratadine  10 mg Oral Daily  . montelukast  10 mg Oral q morning - 10a  . multivitamin with minerals  1 tablet Oral Daily  . nutrition supplement (JUVEN)  1 packet Oral Q2000  . nystatin  5 mL Oral QID  . pantoprazole (PROTONIX) IV  40 mg Intravenous Q12H  . Peginterferon Beta-1a  125 mcg Subcutaneous Q14 Days  . saccharomyces boulardii  250 mg Oral BID  . sodium chloride  500 mL Intravenous Once  . sodium chloride  3 mL Intravenous Q12H  . tiZANidine  4 mg Oral QID  . triamcinolone   Topical BID    Continuous Infusions:     Time spent: 21mins   Verneita Griffes, MD [facility] [phone]

## 2015-04-27 ENCOUNTER — Telehealth: Payer: Self-pay | Admitting: Diagnostic Neuroimaging

## 2015-04-27 DIAGNOSIS — L89304 Pressure ulcer of unspecified buttock, stage 4: Secondary | ICD-10-CM

## 2015-04-27 DIAGNOSIS — R112 Nausea with vomiting, unspecified: Secondary | ICD-10-CM

## 2015-04-27 MED ORDER — PANTOPRAZOLE SODIUM 40 MG PO TBEC: 40.0000 mg | DELAYED_RELEASE_TABLET | Freq: Every day | ORAL | Status: AC

## 2015-04-27 MED ORDER — OXYCODONE HCL 5 MG PO TABS: 5.0000 mg | ORAL_TABLET | Freq: Four times a day (QID) | ORAL | Status: AC | PRN

## 2015-04-27 NOTE — Discharge Summary (Signed)
Physician Discharge Summary  [name] Mccoy. [name] Mccoy [id number] DOB: 11-Feb-1961 DOA: 04/17/2015  PCP: [name] Chou, NP  Admit date: 04/17/2015 Discharge date: 04/27/2015  Time spent: 45 minutes  Recommendations for Outpatient Follow-up:  1. Continue Protonix 40 mg bid on d/c 2. Stop plegridy-Dr. Leta Mccoy will address side effects of this medication going forward 3. Soft diet for 2-4 days 4. Limited amount of pain medication prescribed on d/c home 5. Will need frequent turning and wound care as per prior   Discharge Diagnoses:  Principal Problem:   Nausea & vomiting Active Problems:   Hypokalemia   Sepsis ([name])   Decubitus ulcer of buttock   Spasm of muscle   Protein-calorie malnutrition, severe (HCC)   UTI (lower urinary tract infection)   Lactic acidosis   Perforated viscus   Discharge Condition: fair  Diet recommendation: soft diet  Filed Weights   04/17/15 1709 04/18/15 0400  Weight: 93.441 kg (206 lb) 89.4 kg (197 lb 1.5 oz)    Hospital Course:  54 y/o ? Multiple sclerosis sicne 1983[baseline bedbound] Neurogenic bladder Severe P-EM Prior r Perc Nephrolithotomy 01/30/13 Chr R Femoral head Osteonecrosis  Admit 04/18/15 n/v and coffee ground emesis eventually as well as lactic acidosis Ct concerning for Pneumatosis   Nausea & vomiting with possible coffee ground materials:  was admitted to SDU initially, now stable CT-abd/pelvis on admission concerning for gastric outlet obstruction with suspected gastric wall ischemia and possible perforation but no classic peritoneal signs.  - NG tube inserted and was pulled out by patient later - graduated diet as per GI -EGD 12/5 + severe proximal gastritis, biopsy neg for malignancy and only showing severe Gastritis -kub on 12/6 stable -Graduate to soft diet  On 12/7 -Appreciate General surgeon and [facility] following. -will need Protonix for ~ 8 weeks moving forward or as determined by GI -Felt to be side effect of  Plegridy [MS medication]-hence d/w Dr. Leta Mccoy 04/27/15 on d/c who recommended to stop med and has appt scheduled with patient soon to determine options  Sepsis:  presented with severe sepsis with leukocytosis, fever, tachycardia, tachypnea and elevated lactate on admission Source of infection UTI? and gastritis/possible perforation of bowel. -IV vancomycin plus Zosyn started on admission per critical care Dr. [name] recommendation. -sepsis resolved culture unrevealing. Critical care signed off.  - d/v vanc on 12/3,  -continued zosyn for gi flora coverage Until 12/6. -CBC in the am  Hypokalemia: K= 2.7 on admission. - Keep mag>2, K>4  UTI: with indwelling foley, foley changed by urology on 12/1 -was on IV vancomycin and Zosyn -blood culture and urine culture unrevealing  Decubitus ulcer of buttock: -wound base clean, does not appear infected -consultation wound care team revealed no specific recs so continue wet-dry dressing son d/c  Protein-calorie malnutrition, severe ([place]): nutrition consulted.  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 Corwin Mccoy since August 2016 [prior GI intolerance with betasteron IFn-beta1b and tecfidera] - Continue home dantrolene, tizanidine, gabapentin -f/u with neurologist -see above discussion  MRSA colonization: finished decolonization protocol.   Discharge Exam: Filed Vitals:   04/26/15 2124 04/27/15 0708  BP:  116/79  Pulse: 86 79  Temp:  98 F (36.7 C)  Resp:  18   Alert pleasant mild n yesterdaya nd apeptite is poor  General: eomim ncat Cardiovascular: s1 s2 no m/r/g Respiratory: clear no added sound, no rales no rhonchi  Discharge Instructions    Current Discharge Medication List  START taking these medications   Details  oxyCODONE (OXY IR/ROXICODONE) 5 MG immediate release tablet Take 1 tablet (5 mg total) by mouth every 6 (six) hours as needed for  moderate pain. Qty: 30 tablet, Refills: 0    pantoprazole (PROTONIX) 40 MG tablet Take 1 tablet (40 mg total) by mouth daily. Qty: 60 tablet, Refills: 0      CONTINUE these medications which have NOT CHANGED   Details  amantadine (SYMMETREL) 100 MG capsule Take 100 mg by mouth 2 (two) times daily.     cetirizine (ZYRTEC) 10 MG tablet Refills: 0    dantrolene (DANTRIUM) 25 MG capsule Take 25 mg by mouth 3 (three) times daily.    feeding supplement (ENSURE COMPLETE) LIQD Take 237 mLs by mouth 3 (three) times daily between meals.    gabapentin (NEURONTIN) 300 MG capsule TAKE 2 CAPSULES 4 TIMES DAILY. Qty: 240 capsule, Refills: 12    gluconic acid-citric acid (RENACIDIN) irrigation Irrigate with 30 mLs as directed as directed. Every 1-3 days for foley. Refills: 1    hydrOXYzine (ATARAX/VISTARIL) 25 MG tablet Take 25 mg by mouth every 4 (four) hours as needed for anxiety or itching.  Refills: 0    montelukast (SINGULAIR) 10 MG tablet Take 10 mg by mouth every morning.  Refills: 0    Multiple Vitamin (MULTIVITAMIN WITH MINERALS) TABS Take 1 tablet by mouth daily.    nystatin (MYCOSTATIN) 100000 UNIT/ML suspension SWISH AND SWALLOW 5 MILLILITERS FOUR TIMES A DAY FOR 5 DAYS Refills: 0    ondansetron (ZOFRAN) 4 MG tablet take 1 tablet by mouth three times a day AS NEEDED FOR NAUSEA. Refills: 0    oxybutynin (DITROPAN) 5 MG tablet Take 5 mg by mouth 3 (three) times daily.    oxyCODONE-acetaminophen (PERCOCET) 10-325 MG per tablet Take 1 tablet by mouth every 6 (six) hours as needed for pain.  Refills: 0    simvastatin (ZOCOR) 20 MG tablet Take 20 mg by mouth at bedtime.     tiZANidine (ZANAFLEX) 4 MG tablet Take 4 mg by mouth 4 (four) times daily.     triamcinolone (KENALOG) 0.025 % cream Refills: 0      STOP taking these medications     metoprolol succinate (TOPROL-XL) 25 MG 24 hr tablet      Peginterferon Beta-1a (PLEGRIDY) 125 MCG/0.5ML SOPN        No Known  Allergies    The results of significant diagnostics from this hospitalization (including imaging, microbiology, ancillary and laboratory) are listed below for reference.    Significant Diagnostic Studies: Ct Abdomen Pelvis W Contrast  04/17/2015  CLINICAL DATA:  Vomiting blood, tachycardia, altered mental status, possible viscus perforation EXAM: CT ABDOMEN AND PELVIS WITH CONTRAST TECHNIQUE: Multidetector CT imaging of the abdomen and pelvis was performed using the standard protocol following bolus administration of intravenous contrast. CONTRAST:  156mL OMNIPAQUE IOHEXOL 300 MG/ML  SOLN COMPARISON:  01/26/2013 FINDINGS: Lower chest: Interstitial change at both lung bases possibly representing pulmonary edema. There is motion artifact. Pneumonia/pneumonitis not excluded. Hepatobiliary: There is some gallbladder wall calcification as well as sludge similar to prior study Pancreas: Normal Spleen: Normal Adrenals/Urinary Tract: Adrenal glands are normal. A few small left renal cysts are present. There is a Foley catheter decompressing the bladder. Stomach/Bowel: Stomach is distended with air-fluid level. There is air in the wall of the stomach and there is pneumoperitoneum around the stomach. Fluid refluxes into the distended distal esophagus. No significant abnormalities of small or large bowel,  which are relatively decompressed when compared to the stomach. Vascular/Lymphatic: No acute findings Reproductive: No acute abnormalities Other: Air extends through the esophageal hiatus into the lower mediastinum, although a greater volume of free air is seen below the diaphragm. Musculoskeletal: Limited evaluation due to suboptimal positioning. Evidence of prior deformed left femur fracture. 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 Evidence of perforated viscus with pneumoperitoneum left upper quadrant.Pneumatosis can be seen as evidence of wall necrosis related to ischemia or  emphysematous gastritis; however, the gastric wall does not appear thickened currently and the possibility that pneumatotics may be of mechanical origin is considered. Bilateral lower lobe infiltrates suggesting edema or possibly pneumonia/pneumonitis Critical Value/emergent results were called by telephone at the time of interpretation on 04/17/2015 at 9:41 pm to Dr. Noemi Chapel , who verbally acknowledged these results. Electronically Signed   By: Skipper Cliche M.D.   On: 04/17/2015 21:41   Dg Chest Port 1 View  04/17/2015  CLINICAL DATA:  Upper GI bleed, hematemesis EXAM: PORTABLE CHEST 1 VIEW COMPARISON:  03/18/2015 FINDINGS: Limited inspiratory effect. Uncoiling of the aorta. Normal heart size. Moderate diffuse interstitial change stable from prior study. There is gas in the left upper quadrant which is only partially visualized, which may be entirely within the stomach. Possibility of left pneumoperitoneum not excluded. IMPRESSION: 1. Chronic interstitial lung disease 2. CT of the abdomen and pelvis recommended in light of clinical information. Pneumoperitoneum not excluded in the left upper abdomen. Electronically Signed   By: Skipper Cliche M.D.   On: 04/17/2015 18:15   Dg Abd 2 Views  04/24/2015  CLINICAL DATA:  Pneumatosis. EXAM: ABDOMEN - 2 VIEW COMPARISON:  04/20/2015.  CT 04/17/2015. FINDINGS: No free air or evidence of obstruction. No organomegaly. Calcifications project over the abdomen which were shown on prior CT to be will subcutaneous within the anterior abdominal wall, likely from subcutaneous injections. IMPRESSION: No evidence of obstruction or free air. Electronically Signed   By: Rolm Baptise M.D.   On: 04/24/2015 09:17   Dg Abd Portable 1v  04/20/2015  CLINICAL DATA:  Gastric distension EXAM: PORTABLE ABDOMEN - 1 VIEW COMPARISON:  Yesterday FINDINGS: Some pneumatosis still seen over the upper stomach, without evidence of gastric distention. Small and large bowel has normal  caliber. Calcifications over the midline abdomen and left flank are in the anterior abdominal wall by CT. IMPRESSION: Less gastric distention than yesterday. No gross progression of gastric pneumatosis. Electronically Signed   By: Monte Fantasia M.D.   On: 04/20/2015 10:01   Dg Abd Portable 1v  04/19/2015  CLINICAL DATA:  Patient with history of gastric distension. Followup evaluation. EXAM: PORTABLE ABDOMEN - 1 VIEW COMPARISON:  CT abdomen pelvis 04/17/2015; radiograph abdomen 04/18/2015. FINDINGS: Interval decompression of the stomach. Small amount of cystic lucencies are still demonstrated within the left upper quadrant, most compatible with pneumatosis involving the gastric wall. Nonobstructed bowel gas pattern. Oral contrast material within descending colon. IMPRESSION: Small amount of residual pneumatosis involving the gastric wall. No evidence for bowel obstruction. Electronically Signed   By: Lovey Newcomer M.D.   On: 04/19/2015 08:36   Dg Abd Portable 1v  04/18/2015  CLINICAL DATA:  Gastric distension EXAM: PORTABLE ABDOMEN - 1 VIEW COMPARISON:  May 26, 2013 FINDINGS: The bowel gas pattern is normal. There is moderate gastric distention of the stomach. No radio-opaque calculi or other significant radiographic abnormality are seen. IMPRESSION: No bowel obstruction.  Moderate gastric distention of the stomach. Electronically  Signed   By: Abelardo Diesel M.D.   On: 04/18/2015 15:41    Microbiology: Recent Results (from the past 240 hour(s))  Urine culture     Status: None   Collection Time: 04/17/15  5:34 PM  Result Value Ref Range Status   Specimen Description URINE, CATHETERIZED  Final   Special Requests NONE  Final   Culture   Final    MULTIPLE SPECIES PRESENT, SUGGEST RECOLLECTION Performed at [facility]    Report Status 04/18/2015 FINAL  Final  Blood Culture (routine x 2)     Status: None   Collection Time: 04/17/15  6:27 PM  Result Value Ref Range Status   Specimen  Description BLOOD RIGHT ARM  Final   Special Requests IN PEDIATRIC BOTTLE 3ML  Final   Culture   Final    NO GROWTH 5 DAYS Performed at [facility]    Report Status 04/22/2015 FINAL  Final  MRSA PCR Screening     Status: Abnormal   Collection Time: 04/18/15  1:48 AM  Result Value Ref Range Status   MRSA by PCR POSITIVE (A) NEGATIVE Final    Comment:        The GeneXpert MRSA Assay (FDA approved for NASAL specimens only), is one component of a comprehensive MRSA colonization surveillance program. It is not intended to diagnose MRSA infection nor to guide or monitor treatment for MRSA infections. RESULT CALLED TO, READ BACK BY AND VERIFIED WITH: Merceda Elks RN AT  [phone] ON 11.30.16 BY SHUEA   Blood Culture (routine x 2)     Status: None   Collection Time: 04/18/15  4:10 AM  Result Value Ref Range Status   Specimen Description BLOOD RIGHT ANTECUBITAL  Final   Special Requests BOTTLES DRAWN AEROBIC AND ANAEROBIC 5ML  Final   Culture   Final    NO GROWTH 5 DAYS Performed at [facility]    Report Status 04/23/2015 FINAL  Final     Labs: Basic Metabolic Panel:  Recent Labs Lab 04/21/15 0431 04/22/15 0413 04/23/15 0416 04/24/15 0427 04/25/15 0520  NA 139 139 140 139 140  K 3.4* 3.7 3.5 3.5 3.7  CL 107 110 110 111 110  CO2 22 21* 21* 22 25  GLUCOSE 102* 113* 106* 102* 109*  BUN 5* <5* <5* <5* <5*  CREATININE 0.78 0.76 0.82 0.84 0.87  CALCIUM 8.3* 8.4* 8.7* 8.7* 8.8*  MG 2.1  --  1.9 2.1 2.1  PHOS 2.7  --   --   --  2.8   Liver Function Tests: No results for input(s): AST, ALT, ALKPHOS, BILITOT, PROT, ALBUMIN in the last 168 hours. No results for input(s): LIPASE, AMYLASE in the last 168 hours. No results for input(s): AMMONIA in the last 168 hours. CBC:  Recent Labs Lab 04/21/15 0431 04/23/15 0416 04/25/15 0520 04/26/15 0357  WBC 6.8 8.1 7.1 6.6  NEUTROABS  --   --   --  3.7  HGB 11.4* 11.7* 11.4* 11.6*  HCT 35.8* 35.9* 36.7* 36.5*  MCV  79.6 79.6 81.6 80.0  PLT 317 405* 444* 417*   Cardiac Enzymes: No results for input(s): CKTOTAL, CKMB, CKMBINDEX, TROPONINI in the last 168 hours. BNP: BNP (last 3 results) No results for input(s): BNP in the last 8760 hours.  ProBNP (last 3 results) No results for input(s): PROBNP in the last 8760 hours.  CBG:  Recent Labs Lab 04/21/15 0758 04/22/15 0744 04/23/15 0735 04/25/15 0804 04/26/15 0752  GLUCAP  101* 103* 93 100* 96       Signed:  Bryona Foxworthy, JAI-GURMUKH  [facility] 04/27/2015, 10:37 AM

## 2015-04-27 NOTE — Progress Notes (Signed)
CSW received notification that pt needing ambulance transport to home.   CSW confirmed address with pt at bedside. RN reports that she spoke with pt caregiver who can be at pt home by 5:30 pm to prepare for pt arrival.   CSW scheduled ambulance transportation for 5:30 pm pick up.   No further social work needs identified at this time.  CSW signing off.   Alison Murray, MSW, [facility] [employer] [phone]

## 2015-04-27 NOTE — Progress Notes (Signed)
Patient reports had a "wave" of nausea earlier but currently denies nausea. Had fish for supper meal and drunk Juven with orange juice earlier as well.

## 2015-04-27 NOTE — Telephone Encounter (Signed)
Called by Dr. Verlon Au re: patient admission to [facility]. Caregiver expressed concern of side effect from plegridy (abd pain/ GI sxs). Advised to hold plegridy for now, and then we can follow up with patient and caregiver in clinic next week.   Tyler Aas, pls setup up appt with me next week and call [phone] to relay appt time for patient.  Penni Bombard, MD [id number], [id number] AM Certified in Neurology, Neurophysiology and Neuroimaging  [facility] [address], [place], [place] [address] [phone]

## 2015-04-27 NOTE — Telephone Encounter (Signed)
Spoke with Caren Griffins and advised her oh his appt with Dr [name]. Spoke with Caren Griffins who stated she "put his appt in his AVS and will inform his nurse".

## 2015-04-27 NOTE — Telephone Encounter (Signed)
Per Dr [name], scheduled patient for hospital FU on 04/30/15, 10:00 am. Attempted to reach Dr Verlon Au; phone rang, no answering machine to LVM. Will call later.

## 2015-04-30 ENCOUNTER — Ambulatory Visit (INDEPENDENT_AMBULATORY_CARE_PROVIDER_SITE_OTHER): Payer: Medicaid Other | Admitting: Diagnostic Neuroimaging

## 2015-04-30 ENCOUNTER — Encounter: Payer: Self-pay | Admitting: Diagnostic Neuroimaging

## 2015-04-30 VITALS — BP 112/79 | HR 114

## 2015-04-30 DIAGNOSIS — G35 Multiple sclerosis: Secondary | ICD-10-CM | POA: Diagnosis not present

## 2015-04-30 NOTE — Progress Notes (Signed)
PATIENT: [name] Mccoy. [name] Mccoy DOB: [birth year]  REASON FOR VISIT: routine MS follow up HISTORY FROM: patient and caregiver (with transporters)  HISTORY OF PRESENT ILLNESS:  UPDATE 04/30/15: Since last visit, went back to ER and then admitted to hospital for gastritis, sepsis; now off plegridy. Pain under control. Spasms under control.  UPDATE 12/18/14: Since last visit, cannot tolerate tecfidera due to severe diarrhea and skin breakdown. No blood per rectum/stool as indicated in phone notes; actually the blood is related to the skin breakdown from frequent diarrhea. Also, they need help for prior authorization for dantrolene.   UPDATE 06/14/14: Since last visit, doing about the same. Had MRI and labs done. Ready to switch to tecfidera vs plegdridy.  UPDATE 03/13/14: No new MS symptoms. Doing better than last visit (more mobile). Wounds have healed nicely. Tolerating betaseron, but with intermittent flu like rxn and diff with skin scarring and lack of injection sites. Spasms are stable and controlled with medications.  UPDATE 09/09/13 (LL):  Patient returns for follow up, accompanied by caregiver and POA.  He has been doing some better since last visit; decubitus ulcers have healed by 75% with wound vac therapy.  He reports increasing fine motor skill loss in his left hand which is the dominant.  He has not been abe to get Provigil or Gabapentin through Medicaid.  POA hopes to have Provigil paid for through his trust fund.  Caregiver states Medicaid required PA with Gabapentin - not sure why it wasn't covered.  He had Urosepsis in Feb and was hospitalized and now has foley changed monthly at [facility].  Tolerating Betaseron well.  UPDATE 03/07/13 (VP): Since last visit with Dr. love in January 2014 patient has had multiple issues including some type of side effect related to Botox injection in March 2014 (resulting in bilateral hip pain and muscle spasm), kidney stone and urinary tract infection,  hospitalization, diagnosed with bilateral hip fractures on x-ray. Patient's hip and hamstring contractures have continued to worsen. He has severe decubitus ulcers. He has gone through rehabilitation but now is living back at home. He is accompanied by a caregiver who has been working with him since the past 8-12 months. Patient is no longer able to sit in a wheelchair for transport. He has to be transported to office visits by ambulance and stretcher. Patient continues to take Betaseron. Occasionally he has flulike reactions.   PRIOR HPI (05/31/12, Dr. Sandria Manly): 54 -year-old left-handed African American single male with multiple sclerosis characterized by gait disorder beginning at age 6 in 59 when he presented with double vision. Intermittently he has taken steroids and as been on Betaseron therapy since 1999. He received Novantrone 11/2002, 03/2003, 05/2003, 06/2003, and 08/2003. He was on monthly IV SoluMedrol beginning in November 2008 and noticed improvement with that medication but I cannot objectively document it. 06/2011 he developed "sprains in his hips" with soreness in his groin region bilaterally. He had increased stiffness and inability to walk since then. He was in a wheelchair at home living by himself or in bed.He was not walking or standing. He was admitted to [facility] center March through June 2013 with decubitus ulcers and received PT. He has been home. 01/09/2012 and 03/16/2012, he developed left hand weakness with inability to write and I gave him 2 courses of IV Solu-Medrol followed by by mouth prednisone with improvement. 12/25/2011 CBC and CMP were normal except elevated alk phos. 01/27/2012 he had evidence of urinary tract infection. He comes in in  a wheelchair. He cannot stand or take steps with assitance because of severe left greater than right leg weakness and hamstring contractions. He denies bowel or bladder dysfunction or Lhermittes sign. He has not had any falls. 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 His last bone density study 10/21/2010 showed low bone mass with osteopenia. His last MRI scan of the cervical spine with and without contrast 04/07/2007 was abnormal showing mild diffuse degenerative disc disease, with central canal stenosis at C4-5, diffuse neuroforaminal stenosis left greater than right at C5-6 and no cord lesions. Last MRI of the brain 04/09/2007 showed bilateral lesions consistent with the diagnosis of MS. He last walked 06/2011. He requires assistance in dressing, bathing, taking care of his toileting needs and bowel habits.He has not fallen. He denies double vision, swallowing problems, slurred speech, or Lhermitte's sign He has home health aides from 10 a.m. to 6 p.m. and 10 p.m. to 2 a.m. He has not seen Dr. Gladstone Lighter for evaluation of his painful hips. X-rays 07/10/2011 showed no acute bony abnormality in the pelvis. He had Botox injection 04/29/2012 to the lower extremities with minimal improvement He is a candidate for motorized wheelchair because he cannot stand or walk. He requires assistance in activities of daily living. He cannot use a manual wheelchair because of left arm weakness. With a transfer board he can transfer to a motorized wheelchair. He needs significant back support because of scoliosis. A scooter would not suffice. He has mental and physical abilities to operate a wheelchair. He has not walked since 06/2011. He only uses a wheelchair. He cannot stand. A motorized wheelchair will allow transportation in his single-story home and improve his ability to do activities of daily living. He can get through the doorways to the bathroom.   REVIEW OF SYSTEMS: Full 14 system review of systems performed and notable only for fatigue trouble swallowing light sensitivity heat intolerance  daytime sleepiness rash wounds itching.   ALLERGIES: No Known Allergies  HOME MEDICATIONS: Outpatient Prescriptions Prior to Visit  Medication Sig Dispense Refill  . amantadine (SYMMETREL) 100 MG capsule Take 100 mg by mouth 2 (two) times daily.     . cetirizine (ZYRTEC) 10 MG tablet   0  . dantrolene (DANTRIUM) 25 MG capsule Take 25 mg by mouth 3 (three) times daily.    . feeding supplement (ENSURE COMPLETE) LIQD Take 237 mLs by mouth 3 (three) times daily between meals.    . gabapentin (NEURONTIN) 300 MG capsule TAKE 2 CAPSULES 4 TIMES DAILY. 240 capsule 12  . gluconic acid-citric acid (RENACIDIN) irrigation Irrigate with 30 mLs as directed as directed. Every 1-3 days for foley.  1  . hydrOXYzine (ATARAX/VISTARIL) 25 MG tablet Take 25 mg by mouth every 4 (four) hours as needed for anxiety or itching.   0  . Multiple Vitamin (MULTIVITAMIN WITH MINERALS) TABS Take 1 tablet by mouth daily.    Marland Kitchen nystatin (MYCOSTATIN) 100000 UNIT/ML suspension SWISH AND SWALLOW 5 MILLILITERS FOUR TIMES A DAY FOR 5 DAYS  0  . ondansetron (ZOFRAN) 4 MG tablet take 1 tablet by mouth three times a day AS NEEDED FOR NAUSEA.  0  . oxybutynin (DITROPAN) 5 MG tablet Take 5 mg by mouth 3 (three) times daily.    Marland Kitchen oxyCODONE (OXY IR/ROXICODONE) 5 MG immediate release tablet Take 1 tablet (5  mg total) by mouth every 6 (six) hours as needed for moderate pain. 30 tablet 0  . oxyCODONE-acetaminophen (PERCOCET) 10-325 MG per tablet Take 1 tablet by mouth every 6 (six) hours as needed for pain.   0  . pantoprazole (PROTONIX) 40 MG tablet Take 1 tablet (40 mg total) by mouth daily. 60 tablet 0  . simvastatin (ZOCOR) 20 MG tablet Take 20 mg by mouth at bedtime.     Marland Kitchen tiZANidine (ZANAFLEX) 4 MG tablet Take 4 mg by mouth 4 (four) times daily.     Marland Kitchen triamcinolone (KENALOG) 0.025 % cream   0  . montelukast (SINGULAIR) 10 MG tablet Take 10 mg by mouth every morning.   0   No facility-administered medications prior to visit.      PHYSICAL EXAM  Filed Vitals:   04/30/15 1007  BP: 112/79  Pulse: 114   There is no weight on file to calculate BMI.  GENERAL EXAM: Patient is in no distress  CARDIOVASCULAR: Regular rate and rhythm, no murmurs, no carotid bruits    NEUROLOGIC:  MENTAL STATUS: awake, alert, language fluent, comprehension intact, naming intact  CRANIAL NERVE: pupils equal and reactive to light, visual fields full to confrontation, extraocular muscles intact, no nystagmus, facial sensation and strength symmetric, uvula midline, shoulder shrug symmetric, tongue midline.  MOTOR: BUE ATROPHY (4/5). LUE INCOORDINATION. BILATERAL LEGS SEVERE HIP AND HAMSTRING CONTRACTURES WITH LEFTWARD ROTATION. RLE 1/5 PROX AND 2-3 DISTALLY. LLE 1/5 IN TOES ONLY. SENSORY: normal and symmetric to light touch; SLIGHTLY REDUCED IN LEFT FOOT COORDINATION: SLIGHT ATAXIA IN LUE REFLEXES: BUE 2, KNEES 2.  GAIT/STATION: LAYING ON TRANSPORT STRETCHER   DIAGNOSTICS/IMAGING    Lab Results  Component Value Date   WBC 6.6 04/26/2015   HGB 11.6* 04/26/2015   HCT 36.5* 04/26/2015   MCV 80.0 04/26/2015   PLT 417* 04/26/2015   CMP Latest Ref Rng 04/25/2015 04/24/2015 04/23/2015  Glucose 65 - 99 mg/dL 109(H) 102(H) 106(H)  BUN 6 - 20 mg/dL <5(L) <5(L) <5(L)  Creatinine 0.61 - 1.24 mg/dL 0.87 0.84 0.82  Sodium 135 - 145 mmol/L 140 139 140  Potassium 3.5 - 5.1 mmol/L 3.7 3.5 3.5  Chloride 101 - 111 mmol/L 110 111 110  CO2 22 - 32 mmol/L 25 22 21(L)  Calcium 8.9 - 10.3 mg/dL 8.8(L) 8.7(L) 8.7(L)  Total Protein 6.5 - 8.1 g/dL - - -  Total Bilirubin 0.3 - 1.2 mg/dL - - -  Alkaline Phos 38 - 126 U/L - - -  AST 15 - 41 U/L - - -  ALT 17 - 63 U/L - - -    04/07/07 MRI cervical spine - mild degenerative disease C3-4 to C5-6; mild spinal stenosis at C4-5; no cord lesions.  04/07/07 MRI brain - multiple bilateral lesions consistent with multiple sclerosis; no enhancing lesions; atrophy  04/11/14 MRI brain (with and without)  (I reviewed images myself and agree with interpretation) 1. Mild round and ovoid periventricular and subcortical chronic demyelinating plaques. 2. No acute plaques. 3. Moderate subcortical and severe corpus callosum atrophy.    ASSESSMENT AND PLAN  54 y.o. year old male here with advanced multiple sclerosis, bedbound with severe BLE contractures. Had continued side effects from betaseron and intolerance to frequent injections. Tried tecfidera but had too much GI side effects. Then switched to plegridy, but had increased gastritis (? Side effect).   PLAN:  I spent 15 minutes of face to face time with patient. Greater than 50% of time was spent  in counseling and coordination of care with patient. In summary we discussed:  - no further disease modifying therapy will be used; will transition to palliative care approach per Alvester Chou, NP - continue tizanidine and gabapentin  Return in about 6 months (around 10/29/2015).    Penni Bombard, MD 54/24/8144, 39:26 AM Certified in Neurology, Neurophysiology and Neuroimaging  [facility] [address] [place], [place] [address] [phone]

## 2015-04-30 NOTE — Patient Instructions (Signed)
Thank you for coming to see [place] at [facility]. I hope we have been able to provide you high quality care today.  You may receive a patient satisfaction survey over the next few weeks. We would appreciate your feedback and comments so that we may continue to improve ourselves and the health of our patients.  - continue gabapentin and tizanidine

## 2015-06-20 ENCOUNTER — Ambulatory Visit: Payer: Medicaid Other | Admitting: Diagnostic Neuroimaging

## 2015-10-29 ENCOUNTER — Ambulatory Visit (INDEPENDENT_AMBULATORY_CARE_PROVIDER_SITE_OTHER): Payer: Medicaid Other | Admitting: Diagnostic Neuroimaging

## 2015-11-22 ENCOUNTER — Encounter: Payer: Self-pay | Admitting: Diagnostic Neuroimaging

## 2015-11-22 ENCOUNTER — Ambulatory Visit (INDEPENDENT_AMBULATORY_CARE_PROVIDER_SITE_OTHER): Payer: Medicaid Other | Admitting: Diagnostic Neuroimaging

## 2015-11-22 VITALS — BP 100/68 | HR 72

## 2015-11-22 DIAGNOSIS — G35 Multiple sclerosis: Secondary | ICD-10-CM

## 2015-11-22 NOTE — Progress Notes (Signed)
PATIENT: [name] Mccoy. [place] DOB: Nov 14, 1960  REASON FOR VISIT: routine MS follow up HISTORY FROM: patient and caregiver (with transporters)  Chief Complaint  Patient presents with  . Multiple Sclerosis    rm 7 care giver-[name], 3 EMTs, living at [facility] and [place]  . Follow-up     HISTORY OF PRESENT ILLNESS:  UPDATE 11/22/15: Since last visit, now living at [facility]. No new events. Pain is stable. Muscles spasms stable.  UPDATE 04/30/15: Since last visit, went back to ER and then admitted to hospital for gastritis, sepsis; now off plegridy. Pain under control. Spasms under control.  UPDATE 12/18/14: Since last visit, cannot tolerate tecfidera due to severe diarrhea and skin breakdown. No blood per rectum/stool as indicated in phone notes; actually the blood is related to the skin breakdown from frequent diarrhea. Also, they need help for prior authorization for dantrolene.   UPDATE 06/14/14: Since last visit, doing about the same. Had MRI and labs done. Ready to switch to tecfidera vs plegdridy.  UPDATE 03/13/14: No new MS symptoms. Doing better than last visit (more mobile). Wounds have healed nicely. Tolerating betaseron, but with intermittent flu like rxn and diff with skin scarring and lack of injection sites. Spasms are stable and controlled with medications.  UPDATE 09/09/13 (LL):  Patient returns for follow up, accompanied by caregiver and POA.  He has been doing some better since last visit; decubitus ulcers have healed by 75% with wound vac therapy.  He reports increasing fine motor skill loss in his left hand which is the dominant.  He has not been abe to get Provigil or Gabapentin through Medicaid.  POA hopes to have Provigil paid for through his trust fund.  Caregiver states Medicaid required PA with Gabapentin - not sure why it wasn't covered.  He had Urosepsis in Feb and was hospitalized and now has foley changed monthly at [facility].  Tolerating Betaseron  well.  UPDATE 03/07/13 (VP): Since last visit with Dr. love in January 2014 patient has had multiple issues including some type of side effect related to Botox injection in March 2014 (resulting in bilateral hip pain and muscle spasm), kidney stone and urinary tract infection, hospitalization, diagnosed with bilateral hip fractures on x-ray. Patient's hip and hamstring contractures have continued to worsen. He has severe decubitus ulcers. He has gone through rehabilitation but now is living back at home. He is accompanied by a caregiver who has been working with him since the past 8-12 months. Patient is no longer able to sit in a wheelchair for transport. He has to be transported to office visits by ambulance and stretcher. Patient continues to take Betaseron. Occasionally he has flulike reactions.   PRIOR HPI (05/31/12, Dr. Erling Cruz): 52 -year-old left-handed African American single male with multiple sclerosis characterized by gait disorder beginning at age 83 in 14 when he presented with double vision. Intermittently he has taken steroids and as been on Betaseron therapy since 1999. He received Novantrone 11/2002, 03/2003, 05/2003, 06/2003, and 08/2003. He was on monthly IV SoluMedrol beginning in November 2008 and noticed improvement with that medication but I cannot objectively document it. 06/2011 he developed "sprains in his hips" with soreness in his groin region bilaterally. He had increased stiffness and inability to walk since then. He was in a wheelchair at home living by himself or in bed.He was not walking or standing. He was admitted to [facility] center March through June 2013 with decubitus ulcers and received PT. He has  been home. 01/09/2012 and 03/16/2012, he developed left hand weakness with inability to write and I gave him 2 courses of IV Solu-Medrol followed by by mouth prednisone with improvement. 12/25/2011 CBC and CMP were normal except elevated alk phos. 01/27/2012 he had evidence of urinary  tract infection. He comes in in a wheelchair. He cannot stand or take steps with assitance because of severe left greater than right leg weakness and hamstring contractions. He denies bowel or bladder dysfunction or Lhermittes sign. He has not had any falls. 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 His last bone density study 10/21/2010 showed low bone mass with osteopenia. His last MRI scan of the cervical spine with and without contrast 04/07/2007 was abnormal showing mild diffuse degenerative disc disease, with central canal stenosis at C4-5, diffuse neuroforaminal stenosis left greater than right at C5-6 and no cord lesions. Last MRI of the brain 04/09/2007 showed bilateral lesions consistent with the diagnosis of MS. He last walked 06/2011. He requires assistance in dressing, bathing, taking care of his toileting needs and bowel habits.He has not fallen. He denies double vision, swallowing problems, slurred speech, or Lhermitte's sign He has home health aides from 10 a.m. to 6 p.m. and 10 p.m. to 2 a.m. He has not seen Dr. Gladstone Lighter for evaluation of his painful hips. X-rays 07/10/2011 showed no acute bony abnormality in the pelvis. He had Botox injection 04/29/2012 to the lower extremities with minimal improvement He is a candidate for motorized wheelchair because he cannot stand or walk. He requires assistance in activities of daily living. He cannot use a manual wheelchair because of left arm weakness. With a transfer board he can transfer to a motorized wheelchair. He needs significant back support because of scoliosis. A scooter would not suffice. He has mental and physical abilities to operate a wheelchair. He has not walked since 06/2011. He only uses a wheelchair. He cannot stand. A motorized wheelchair will allow transportation in his  single-story home and improve his ability to do activities of daily living. He can get through the doorways to the bathroom.   REVIEW OF SYSTEMS: Full 14 system review of systems performed and negative except as per HPI.   ALLERGIES: No Known Allergies  HOME MEDICATIONS: Outpatient Prescriptions Prior to Visit  Medication Sig Dispense Refill  . cetirizine (ZYRTEC) 10 MG tablet   0  . feeding supplement (ENSURE COMPLETE) LIQD Take 237 mLs by mouth 3 (three) times daily between meals.    Marland Kitchen gluconic acid-citric acid (RENACIDIN) irrigation Irrigate with 30 mLs as directed as directed. Every 1-3 days for foley.  1  . hydrOXYzine (ATARAX/VISTARIL) 25 MG tablet Take 25 mg by mouth every 4 (four) hours as needed for anxiety or itching.   0  . metoprolol succinate (TOPROL-XL) 25 MG 24 hr tablet 25 mg.  0  . Multiple Vitamin (MULTIVITAMIN WITH MINERALS) TABS Take 1 tablet by mouth daily.    Marland Kitchen oxybutynin (DITROPAN) 5 MG tablet Take 5 mg by mouth 3 (three) times daily.    Marland Kitchen oxyCODONE-acetaminophen (PERCOCET) 10-325 MG per tablet Take 1 tablet by mouth every 6 (six) hours as needed for pain.   0  . pantoprazole (PROTONIX) 40 MG tablet Take 1 tablet (40 mg total) by mouth daily. 60 tablet 0  . simvastatin (ZOCOR) 20 MG tablet Take 20 mg by  mouth at bedtime.     Marland Kitchen tiZANidine (ZANAFLEX) 4 MG tablet Take 4 mg by mouth 4 (four) times daily.     Marland Kitchen amantadine (SYMMETREL) 100 MG capsule Take 100 mg by mouth 2 (two) times daily. Reported on 11/22/2015    . oxyCODONE (OXY IR/ROXICODONE) 5 MG immediate release tablet Take 1 tablet (5 mg total) by mouth every 6 (six) hours as needed for moderate pain. (Patient not taking: Reported on 11/22/2015) 30 tablet 0  . gabapentin (NEURONTIN) 300 MG capsule TAKE 2 CAPSULES 4 TIMES DAILY. 240 capsule 12  . nystatin (MYCOSTATIN) 100000 UNIT/ML suspension SWISH AND SWALLOW 5 MILLILITERS FOUR TIMES A DAY FOR 5 DAYS  0  . ondansetron (ZOFRAN) 4 MG tablet take 1 tablet by mouth three  times a day AS NEEDED FOR NAUSEA.  0  . triamcinolone (KENALOG) 0.025 % cream   0   No facility-administered medications prior to visit.     PHYSICAL EXAM  Filed Vitals:   11/22/15 1502  BP: 100/68  Pulse: 72   There is no weight on file to calculate BMI.  GENERAL EXAM: Patient is in no distress  CARDIOVASCULAR: Regular rate and rhythm, no murmurs, no carotid bruits    NEUROLOGIC:  MENTAL STATUS: awake, alert, language fluent, comprehension intact, naming intact  CRANIAL NERVE: pupils equal and reactive to light, visual fields full to confrontation, extraocular muscles intact, no nystagmus, facial sensation and strength symmetric, uvula midline, shoulder shrug symmetric, tongue midline.  MOTOR: BUE ATROPHY (4/5). LUE INCOORDINATION. BILATERAL LEGS SEVERE HIP AND HAMSTRING CONTRACTURES WITH LEFTWARD ROTATION. RLE 1/5 PROX AND 2-3 DISTALLY. LLE 1/5 IN TOES ONLY. SENSORY: normal and symmetric to light touch; SLIGHTLY REDUCED IN LEFT FOOT COORDINATION: SLIGHT ATAXIA IN LUE REFLEXES: BUE 2, KNEES 2.  GAIT/STATION: LAYING ON TRANSPORT STRETCHER   DIAGNOSTICS/IMAGING    Lab Results  Component Value Date   WBC 6.6 04/26/2015   HGB 11.6* 04/26/2015   HCT 36.5* 04/26/2015   MCV 80.0 04/26/2015   PLT 417* 04/26/2015   CMP Latest Ref Rng 04/25/2015 04/24/2015 04/23/2015  Glucose 65 - 99 mg/dL 109(H) 102(H) 106(H)  BUN 6 - 20 mg/dL <5(L) <5(L) <5(L)  Creatinine 0.61 - 1.24 mg/dL 0.87 0.84 0.82  Sodium 135 - 145 mmol/L 140 139 140  Potassium 3.5 - 5.1 mmol/L 3.7 3.5 3.5  Chloride 101 - 111 mmol/L 110 111 110  CO2 22 - 32 mmol/L 25 22 21(L)  Calcium 8.9 - 10.3 mg/dL 8.8(L) 8.7(L) 8.7(L)  Total Protein 6.5 - 8.1 g/dL - - -  Total Bilirubin 0.3 - 1.2 mg/dL - - -  Alkaline Phos 38 - 126 U/L - - -  AST 15 - 41 U/L - - -  ALT 17 - 63 U/L - - -    04/07/07 MRI cervical spine - mild degenerative disease C3-4 to C5-6; mild spinal stenosis at C4-5; no cord lesions.  04/07/07 MRI  brain - multiple bilateral lesions consistent with multiple sclerosis; no enhancing lesions; atrophy  04/11/14 MRI brain (with and without) (I reviewed images myself and agree with interpretation) 1. Mild round and ovoid periventricular and subcortical chronic demyelinating plaques. 2. No acute plaques. 3. Moderate subcortical and severe corpus callosum atrophy.     ASSESSMENT AND PLAN  55 y.o. year old male here with advanced multiple sclerosis, bedbound with severe BLE contractures. Had continued side effects from betaseron and intolerance to frequent injections. Tried tecfidera but had too much GI side effects. Then switched to  plegridy, but had increased gastritis (? Side effect).   Dx: advanced multiple sclerosis   Multiple sclerosis ([place])    PLAN:  I spent 15 minutes of face to face time with patient. Greater than 50% of time was spent in counseling and coordination of care with patient. In summary we discussed:  - no further disease modifying therapy - continue tizanidine and gabapentin - continue palliative care approach  Return if symptoms worsen or fail to improve, for return to PCP.    Penni Bombard, MD 06/23/537, 7:67 PM Certified in Neurology, Neurophysiology and Neuroimaging  [facility] [address], [place], [place] [address] [phone]

## 2015-12-04 ENCOUNTER — Telehealth: Payer: Self-pay | Admitting: Diagnostic Neuroimaging

## 2015-12-04 NOTE — Telephone Encounter (Signed)
Pt last note faxed to [facility] on 12/04/15.

## 2015-12-04 NOTE — Telephone Encounter (Signed)
[facility] called to request last consult note with Dr. [name].

## 2016-02-17 IMAGING — CT CT ABD-PELV W/ CM
2 of 5 series · 15 of 46 positions shown, 17 images · IV contrast (OMNIPAQUE 300)
Comparison: 01/26/2013

CLINICAL DATA: Vomiting blood, tachycardia, altered mental status,
possible viscus perforation

EXAM:
CT ABDOMEN AND PELVIS WITH CONTRAST
TECHNIQUE: Multidetector CT imaging of the abdomen and pelvis was performed
using the standard protocol following bolus administration of
intravenous contrast.
CONTRAST:  100mL OMNIPAQUE IOHEXOL 300 MG/ML  SOLN

[Series 2: abd/pel with · axial · 0.79mm/px · z∈[-890,-420]mm · 12 of 106 slices shown, 14 images]
[im 6/106  soft-tissue]
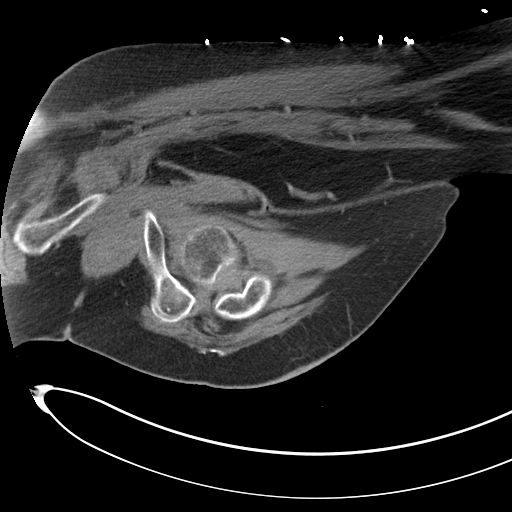
[im 6/106  bone]
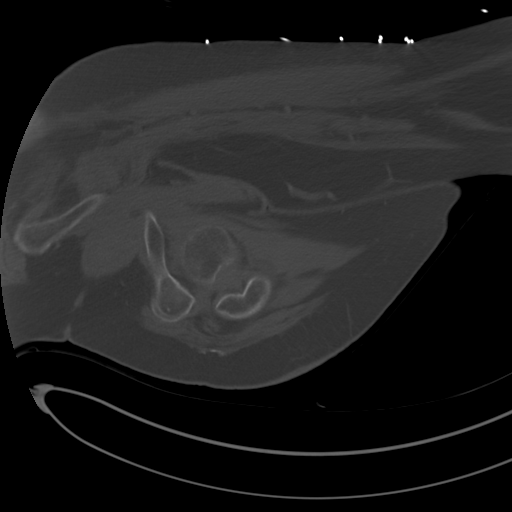
[im 17/106  soft-tissue]
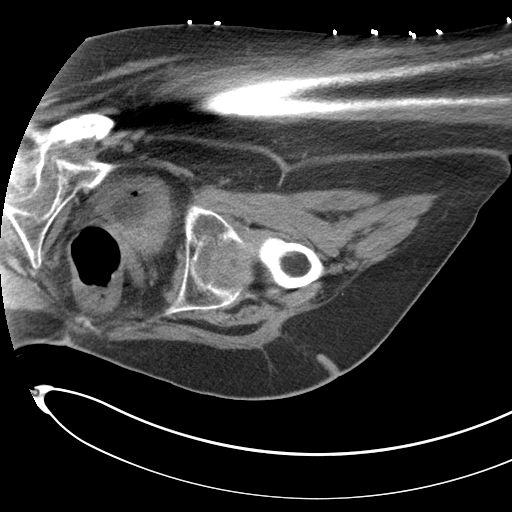
[im 23/106  soft-tissue]
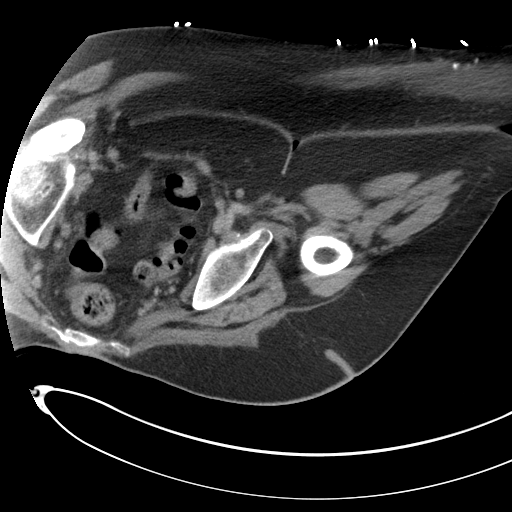
[im 34/106  soft-tissue]
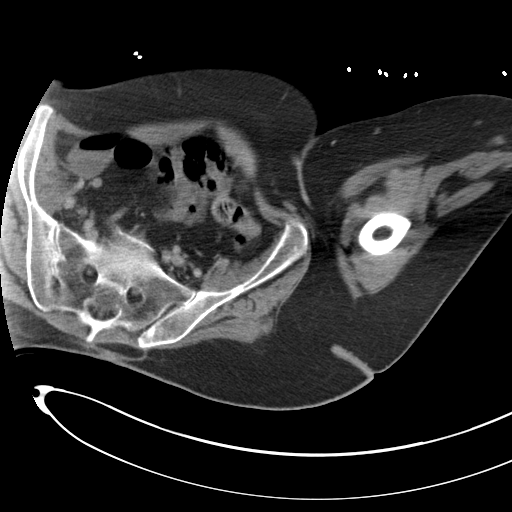
[im 39/106  soft-tissue]
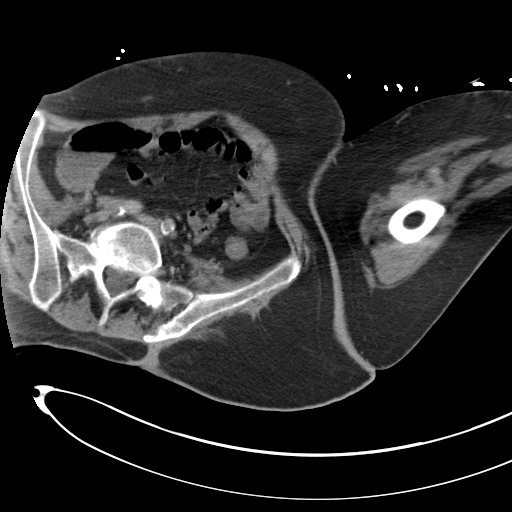
[im 50/106  soft-tissue]
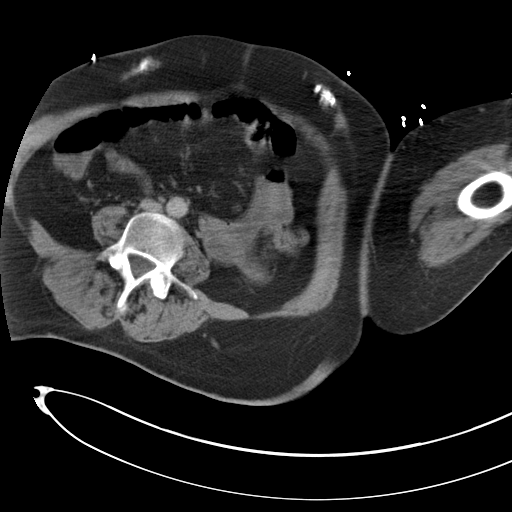
[im 56/106  soft-tissue]
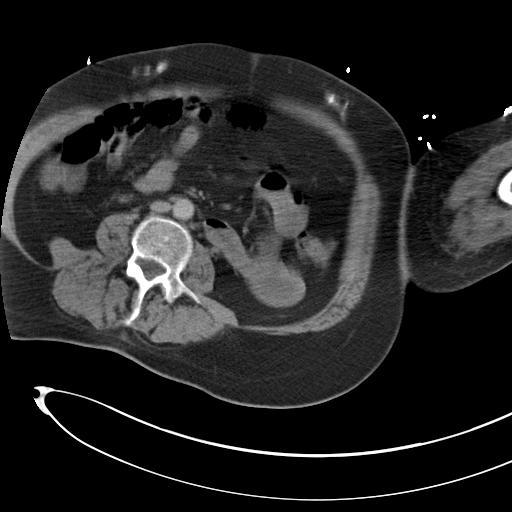
[im 67/106  soft-tissue]
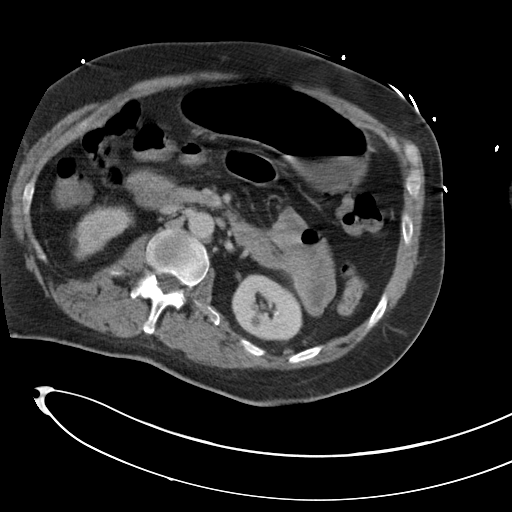
[im 72/106  soft-tissue]
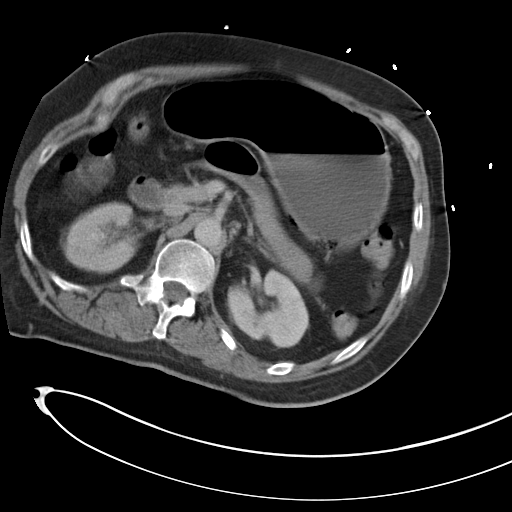
[im 72/106  bone]
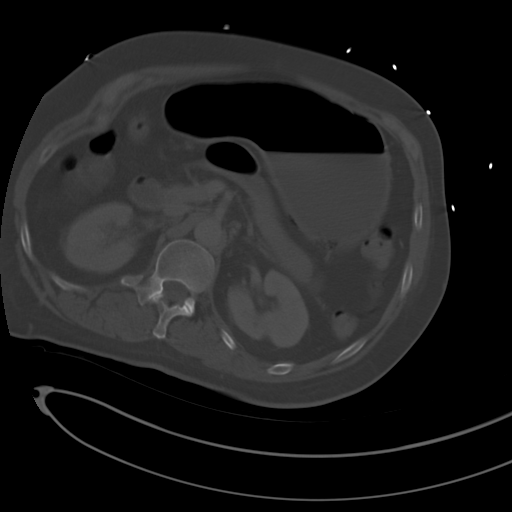
[im 83/106  soft-tissue]
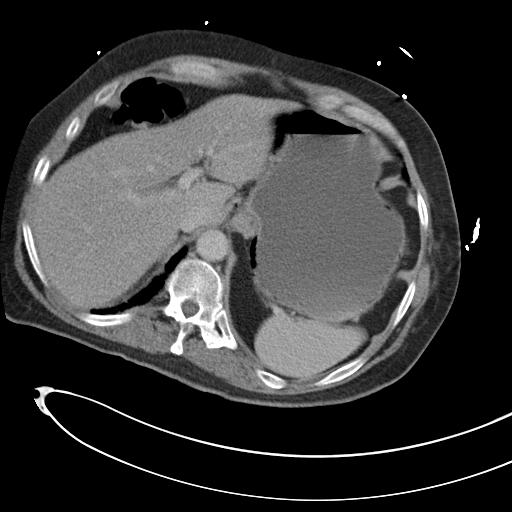
[im 89/106  soft-tissue]
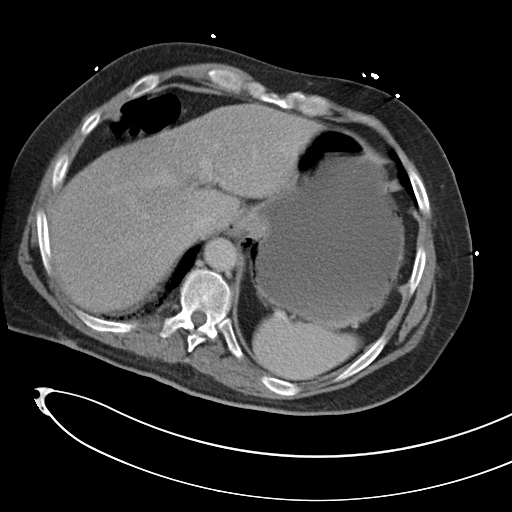
[im 100/106  soft-tissue]
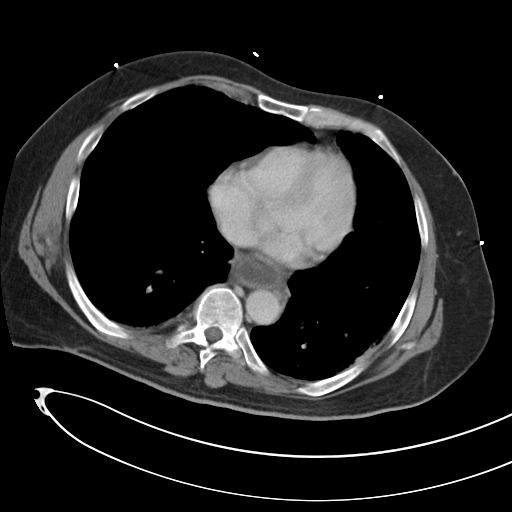

[Series 3: coronal a/|p · coronal · 0.73mm/px · 3 of 100 slices shown]
[im 34/100  soft-tissue]
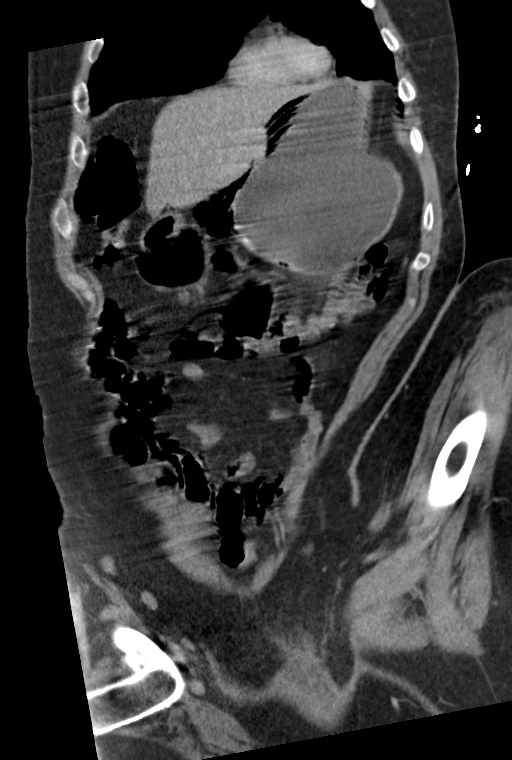
[im 45/100  soft-tissue]
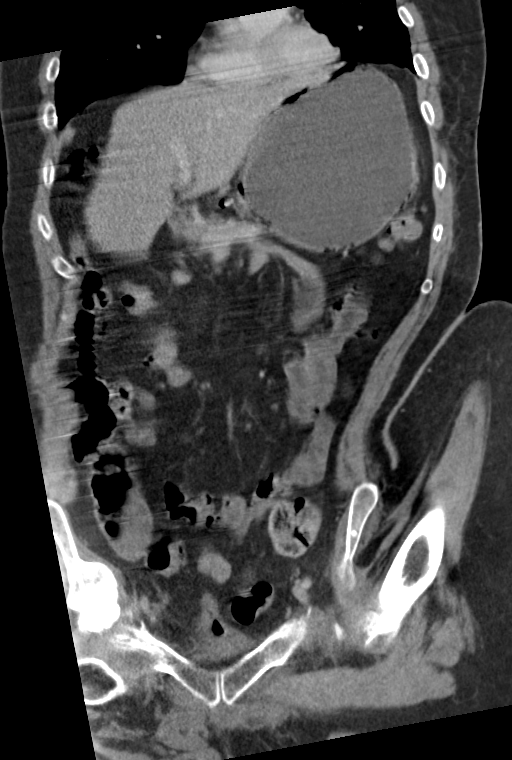
[im 56/100  soft-tissue]
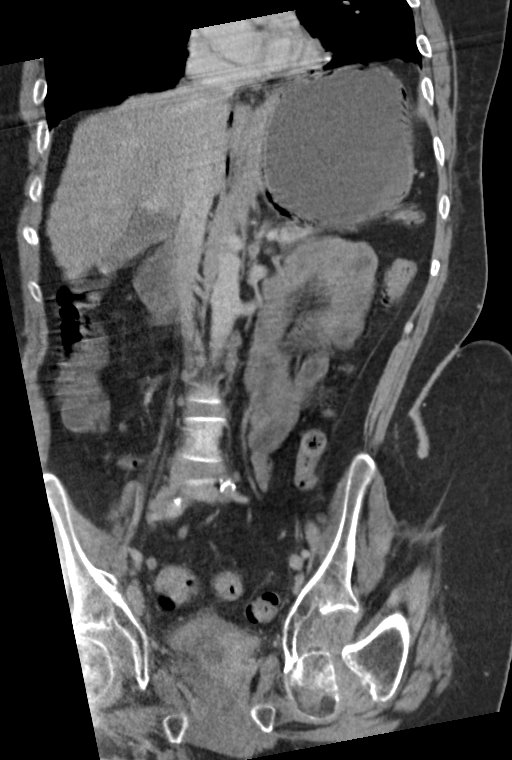

[15 of 46 positions shown; findings below may reference images not displayed]

FINDINGS: Lower chest: Interstitial change at both lung bases possibly
representing pulmonary edema. There is motion artifact.
Pneumonia/pneumonitis not excluded.

Hepatobiliary: There is some gallbladder wall calcification as well
as sludge similar to prior study

Pancreas: Normal

Spleen: Normal

Adrenals/Urinary Tract: Adrenal glands are normal. A few small left
renal cysts are present. There is a Foley catheter decompressing the
bladder.

Stomach/Bowel: Stomach is distended with air-fluid level. There is
air in the wall of the stomach and there is pneumoperitoneum around
the stomach. Fluid refluxes into the distended distal esophagus. No
significant abnormalities of small or large bowel, which are
relatively decompressed when compared to the stomach.

Vascular/Lymphatic: No acute findings

Reproductive: No acute abnormalities

Other: Air extends through the esophageal hiatus into the lower
mediastinum, although a greater volume of free air is seen below the
diaphragm.

Musculoskeletal: Limited evaluation due to suboptimal positioning.
Evidence of prior deformed left femur fracture.
IMPRESSION: Air in the gastric wall diffusely consistent with pneumatosis.
Evidence of perforated viscus with pneumoperitoneum left upper
quadrant.Pneumatosis can be seen as evidence of wall necrosis
related to ischemia or emphysematous gastritis; however, the gastric
wall does not appear thickened currently and the possibility that
pneumatotics may be of mechanical origin is considered.

Bilateral lower lobe infiltrates suggesting edema or possibly
pneumonia/pneumonitis

Critical Value/emergent results were called by telephone at the time
of interpretation on 04/17/2015 at [DATE] to Dr. NAZARETH JUMPER , who
verbally acknowledged these results.

## 2016-02-18 IMAGING — DX DG ABD PORTABLE 1V
1 series · 1 of 1 positions shown · non-contrast
Comparison: May 26, 2013

CLINICAL DATA: Gastric distension

EXAM:
PORTABLE ABDOMEN - 1 VIEW

[abdomen kub]
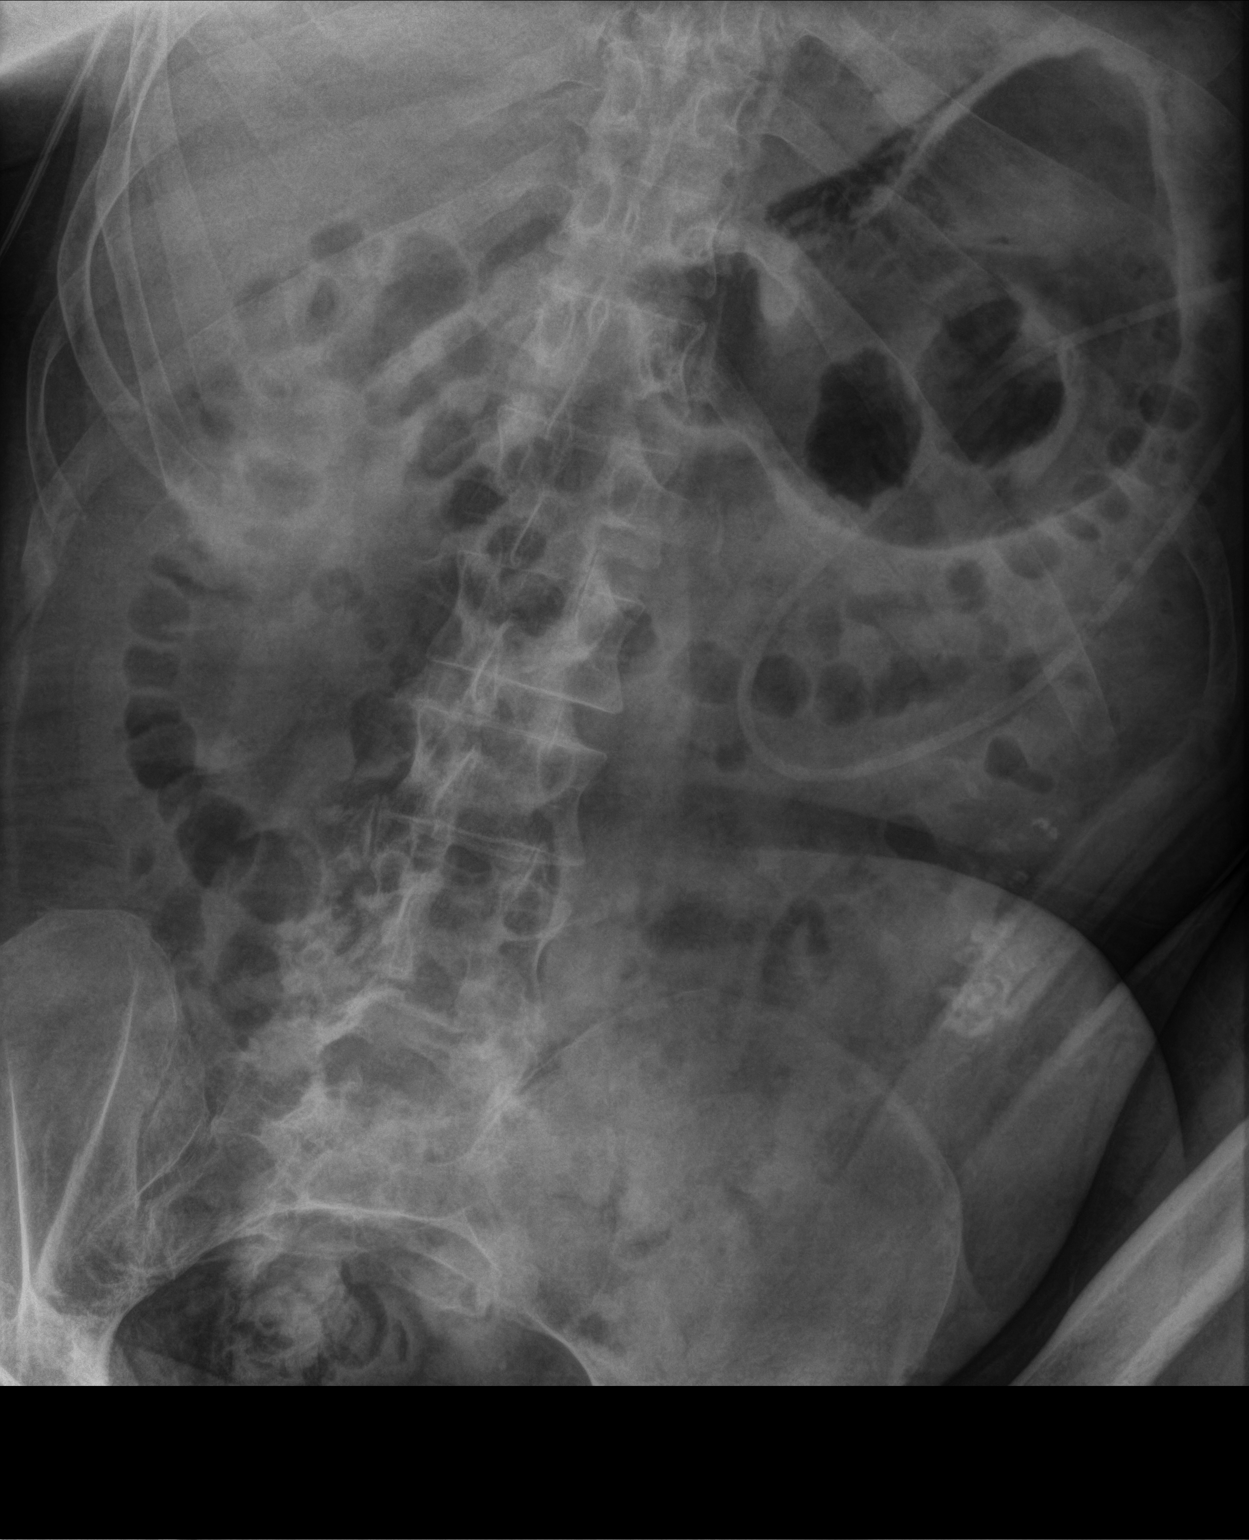

[1 of 1 positions shown; findings below may reference images not displayed]

FINDINGS: The bowel gas pattern is normal. There is moderate gastric
distention of the stomach. No radio-opaque calculi or other
significant radiographic abnormality are seen.
IMPRESSION: No bowel obstruction.  Moderate gastric distention of the stomach.

## 2016-02-20 IMAGING — CR DG ABD PORTABLE 1V
1 series · 1 of 1 positions shown · non-contrast
Comparison: Yesterday

CLINICAL DATA: Gastric distension

EXAM:
PORTABLE ABDOMEN - 1 VIEW

[ap (kub)]
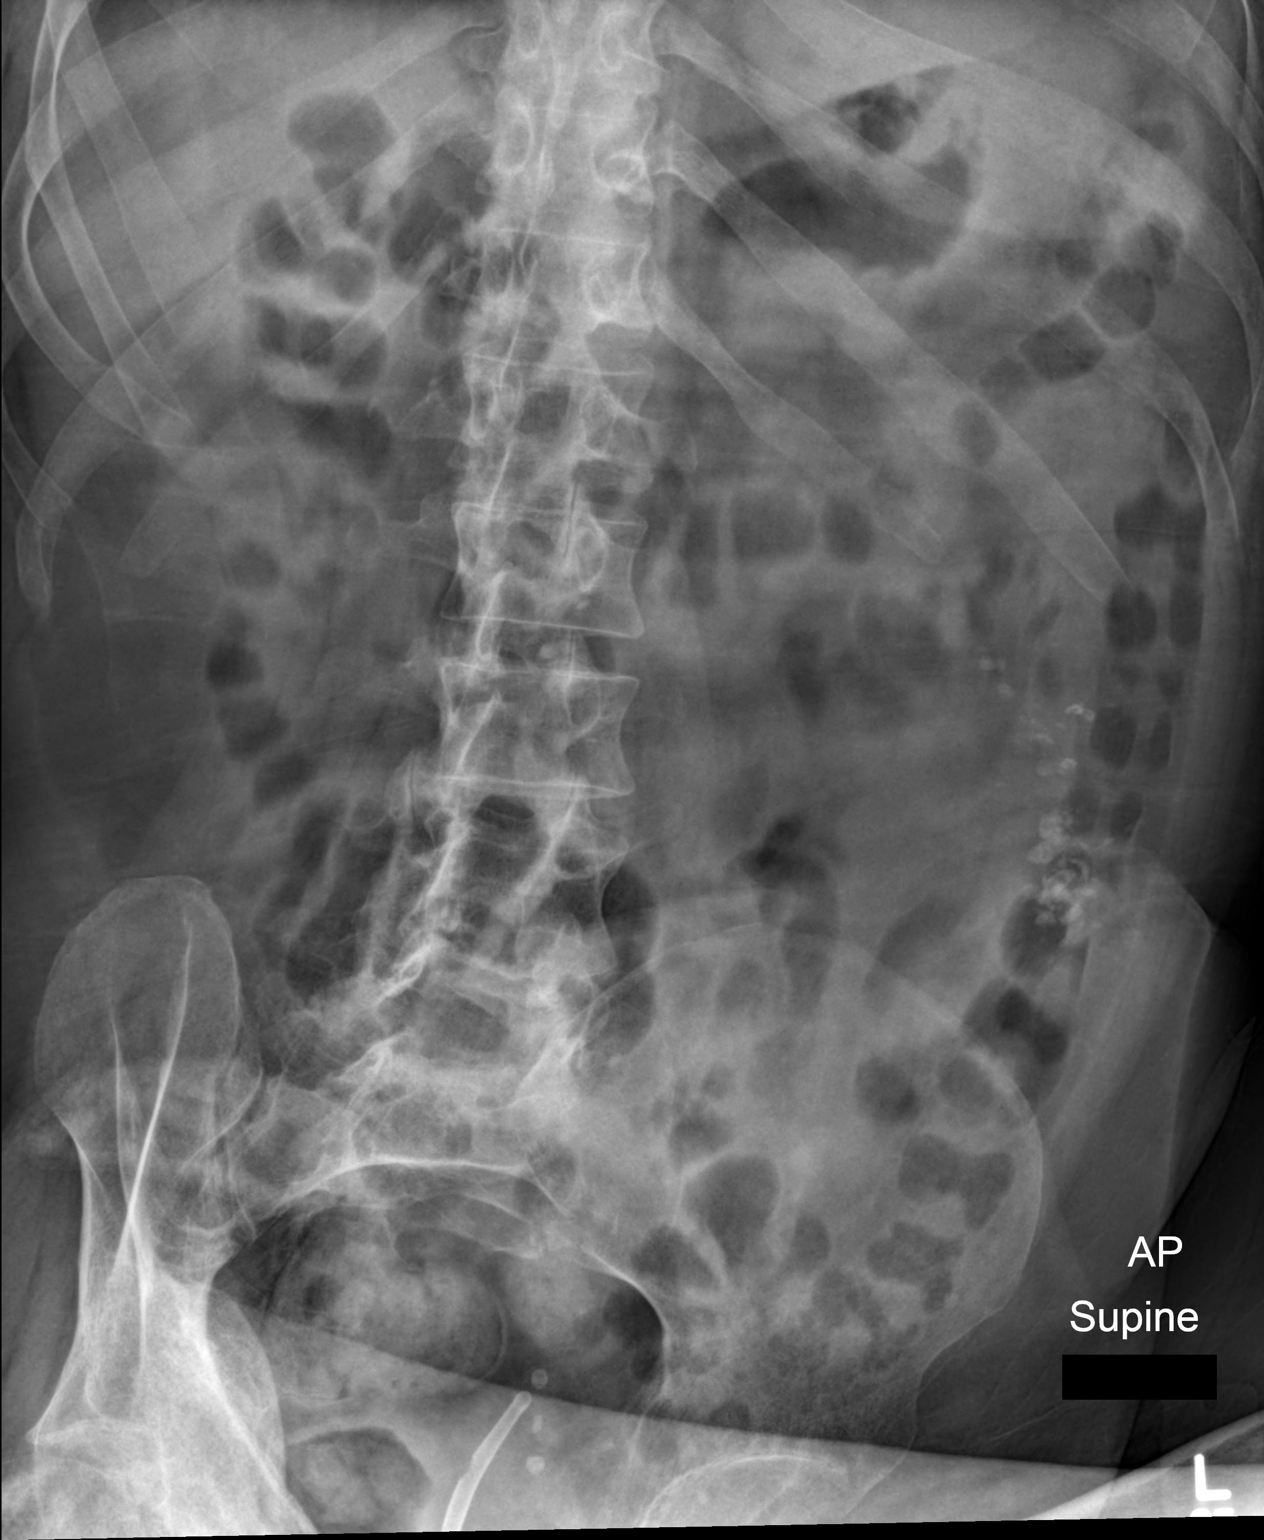

[1 of 1 positions shown; findings below may reference images not displayed]

FINDINGS: Some pneumatosis still seen over the upper stomach, without evidence
of gastric distention. Small and large bowel has normal caliber.

Calcifications over the midline abdomen and left flank are in the
anterior abdominal wall by CT.
IMPRESSION: Less gastric distention than yesterday. No gross progression of
gastric pneumatosis.

## 2018-01-17 DEATH — deceased
